# Patient Record
Sex: Female | Born: 1990 | Race: Black or African American | Hispanic: No | Marital: Single | Smoking: Current every day smoker
Health system: Southern US, Community
[De-identification: ages and names within clinical notes are randomized; demographics above are authoritative.]

## PROBLEM LIST (undated history)

## (undated) ENCOUNTER — Inpatient Hospital Stay (HOSPITAL_COMMUNITY): Payer: Self-pay

## (undated) ENCOUNTER — Inpatient Hospital Stay: Payer: Self-pay

## (undated) ENCOUNTER — Ambulatory Visit: Payer: Medicaid Other

## (undated) DIAGNOSIS — B009 Herpesviral infection, unspecified: Secondary | ICD-10-CM

## (undated) DIAGNOSIS — A599 Trichomoniasis, unspecified: Secondary | ICD-10-CM

## (undated) DIAGNOSIS — B9689 Other specified bacterial agents as the cause of diseases classified elsewhere: Secondary | ICD-10-CM

## (undated) DIAGNOSIS — N760 Acute vaginitis: Secondary | ICD-10-CM

## (undated) DIAGNOSIS — A749 Chlamydial infection, unspecified: Secondary | ICD-10-CM

## (undated) DIAGNOSIS — A6009 Herpesviral infection of other urogenital tract: Secondary | ICD-10-CM

## (undated) DIAGNOSIS — O039 Complete or unspecified spontaneous abortion without complication: Secondary | ICD-10-CM

## (undated) DIAGNOSIS — G43909 Migraine, unspecified, not intractable, without status migrainosus: Secondary | ICD-10-CM

## (undated) DIAGNOSIS — N390 Urinary tract infection, site not specified: Secondary | ICD-10-CM

## (undated) HISTORY — DX: Migraine, unspecified, not intractable, without status migrainosus: G43.909

## (undated) HISTORY — PX: NO PAST SURGERIES: SHX2092

## (undated) HISTORY — PX: WISDOM TOOTH EXTRACTION: SHX21

---

## 2011-08-12 ENCOUNTER — Emergency Department (HOSPITAL_COMMUNITY)
Admission: EM | Admit: 2011-08-12 | Discharge: 2011-08-12 | Disposition: A | Discharge status: Home / Self Care | Payer: No Typology Code available for payment source | Attending: Emergency Medicine | Admitting: Emergency Medicine

## 2011-08-12 DIAGNOSIS — R51 Headache: Secondary | ICD-10-CM | POA: Insufficient documentation

## 2011-08-15 ENCOUNTER — Inpatient Hospital Stay (INDEPENDENT_AMBULATORY_CARE_PROVIDER_SITE_OTHER)
Admission: RE | Admit: 2011-08-15 | Discharge: 2011-08-15 | Disposition: A | Discharge status: Home / Self Care | Payer: No Typology Code available for payment source | Source: Ambulatory Visit | Attending: Family Medicine | Admitting: Family Medicine

## 2011-08-15 DIAGNOSIS — S0990XA Unspecified injury of head, initial encounter: Secondary | ICD-10-CM

## 2011-11-12 ENCOUNTER — Emergency Department (INDEPENDENT_AMBULATORY_CARE_PROVIDER_SITE_OTHER)
Admission: EM | Admit: 2011-11-12 | Discharge: 2011-11-12 | Disposition: A | Discharge status: Home / Self Care | Payer: Medicaid Other | Source: Home / Self Care | Attending: Emergency Medicine | Admitting: Emergency Medicine

## 2011-11-12 ENCOUNTER — Encounter: Payer: Self-pay | Admitting: *Deleted

## 2011-11-12 DIAGNOSIS — N760 Acute vaginitis: Secondary | ICD-10-CM

## 2011-11-12 DIAGNOSIS — B9689 Other specified bacterial agents as the cause of diseases classified elsewhere: Secondary | ICD-10-CM

## 2011-11-12 DIAGNOSIS — A499 Bacterial infection, unspecified: Secondary | ICD-10-CM

## 2011-11-12 LAB — POCT URINALYSIS DIP (DEVICE)
Ketones, ur: NEGATIVE mg/dL
Leukocytes, UA: NEGATIVE
Protein, ur: NEGATIVE mg/dL

## 2011-11-12 LAB — WET PREP, GENITAL

## 2011-11-12 LAB — POCT PREGNANCY, URINE: Preg Test, Ur: NEGATIVE

## 2011-11-12 MED ORDER — METRONIDAZOLE 500 MG PO TABS: 500.0000 mg | ORAL_TABLET | Freq: Two times a day (BID) | ORAL | Status: AC

## 2011-11-12 NOTE — ED Notes (Signed)
Co vaginal dc x 1 wk, with foul odor, no itching

## 2011-11-12 NOTE — ED Provider Notes (Signed)
History     CSN: [id number] Arrival date & time: 11/12/2011  4:33 PM   First MD Initiated Contact with Patient 11/12/11 1523      Chief Complaint  Patient presents with  . Vaginal Discharge    (Consider location/radiation/quality/duration/timing/severity/associated sxs/prior treatment) HPI Comments: She is a 20 year old female who has had a one-week history of a white discharge with odor. She denies any itching, irritation, or vaginal or vulvar pain. She has had no fever, chills, nausea, vomiting, pelvic pain, lower back pain, or urinary symptoms. She is on birth control pills. Her menses are irregular. She does have a history of yeast infections. No prior history of bacterial vaginosis or STDs.  Patient is a 20 y.o. female presenting with vaginal discharge.  Vaginal Discharge Pertinent negatives include no abdominal pain.    History reviewed. No pertinent past medical history.  History reviewed. No pertinent past surgical history.  History reviewed. No pertinent family history.  History  Substance Use Topics  . Smoking status: Never Smoker   . Smokeless tobacco: Not on file  . Alcohol Use: No    OB History    Grav Para Term Preterm Abortions TAB SAB Ect Mult Living                  Review of Systems  Constitutional: Negative for fever and chills.  Gastrointestinal: Negative for nausea, vomiting, abdominal pain and diarrhea.  Genitourinary: Positive for vaginal discharge. Negative for dysuria, urgency, frequency, hematuria, vaginal bleeding, difficulty urinating, genital sores, vaginal pain, menstrual problem, pelvic pain and dyspareunia.    Allergies  Review of patient's allergies indicates no known allergies.  Home Medications   Current Outpatient Rx  Name Route Sig Dispense Refill  . LEVONORGESTREL-ETHINYL ESTRAD 0.1-20 MG-MCG PO TABS Oral Take 1 tablet by mouth daily.      [name] METRONIDAZOLE 500 MG PO TABS Oral Take 1 tablet (500 mg total) by mouth 2 (two)  times daily. 14 tablet 0    BP 127/71  Pulse 81  Temp(Src) 98.5 F (36.9 C) (Oral)  Resp 16  SpO2 100%  Physical Exam  Nursing note and vitals reviewed. Constitutional: She appears well-developed and well-nourished. No distress.  Cardiovascular: Normal rate, regular rhythm, normal heart sounds and intact distal pulses.  Exam reveals no gallop and no friction rub.   No murmur heard. Pulmonary/Chest: Effort normal and breath sounds normal. No respiratory distress. She has no wheezes. She has no rales.  Abdominal: Soft. Bowel sounds are normal. She exhibits no distension and no mass. There is no tenderness. There is no rebound and no guarding.  Genitourinary: Uterus normal. There is no rash or lesion on the right labia. There is no rash or lesion on the left labia. Uterus is not deviated, not enlarged, not fixed and not tender. Cervix exhibits no motion tenderness, no discharge and no friability. Right adnexum displays no mass and no tenderness. Left adnexum displays no mass and no tenderness. No erythema, tenderness or bleeding around the vagina. No foreign body around the vagina. Vaginal discharge found.       Pelvic exam reveals normal external genitalia. She has a heavy, white, homogeneous, malodorous discharge. The cervix appears normal. There was no pain on cervical movement. Uterus was anterior, normal size and shape and nontender. There were no adnexal tenderness or masses.  Skin: Skin is warm and dry. No rash noted. She is not diaphoretic.    ED Course  Procedures (including critical care time)  Results for  orders placed during the hospital encounter of 11/12/11  POCT URINALYSIS DIP (DEVICE)      Component Value Range   Glucose, UA NEGATIVE  NEGATIVE (mg/dL)   Bilirubin Urine NEGATIVE  NEGATIVE    Ketones, ur NEGATIVE  NEGATIVE (mg/dL)   Specific Gravity, Urine 1.025  1.005 - 1.030    Hgb urine dipstick NEGATIVE  NEGATIVE    pH 6.5  5.0 - 8.0    Protein, ur NEGATIVE  NEGATIVE  (mg/dL)   Urobilinogen, UA 0.2  0.0 - 1.0 (mg/dL)   Nitrite NEGATIVE  NEGATIVE    Leukocytes, UA NEGATIVE  NEGATIVE   POCT PREGNANCY, URINE      Component Value Range   Preg Test, Ur NEGATIVE       Labs Reviewed  POCT URINALYSIS DIP (DEVICE)  POCT PREGNANCY, URINE  POCT URINALYSIS DIPSTICK  POCT PREGNANCY, URINE  WET PREP, GENITAL  GC/CHLAMYDIA PROBE AMP, GENITAL   No results found.   1. Bacterial vaginosis       MDM  She has bacterial vaginosis and will treat with a one-week course of metronidazole. Also suggested probiotics for prevention of future episodes.        Roque Lias, MD 11/12/11 [phone]

## 2011-11-12 NOTE — ED Notes (Signed)
Pt states she had been on depo injection and recently swithced to bcp, states lmp about 2 months ago

## 2011-11-13 NOTE — ED Notes (Signed)
Wet prep result reviewed for 12/16. Pt. adequately treated with Flagyl for clue cells.  GC/chlamydia pending. Vassie Moselle  11/13/2011

## 2011-11-14 NOTE — ED Notes (Signed)
GC and Chlamydia neg.  Jody Perez 11/14/2011

## 2012-01-21 ENCOUNTER — Encounter (HOSPITAL_COMMUNITY): Payer: Self-pay | Admitting: Emergency Medicine

## 2012-01-21 ENCOUNTER — Emergency Department (INDEPENDENT_AMBULATORY_CARE_PROVIDER_SITE_OTHER)
Admission: EM | Admit: 2012-01-21 | Discharge: 2012-01-21 | Disposition: A | Discharge status: Home / Self Care | Payer: Self-pay | Source: Home / Self Care | Attending: Emergency Medicine | Admitting: Emergency Medicine

## 2012-01-21 DIAGNOSIS — A6000 Herpesviral infection of urogenital system, unspecified: Secondary | ICD-10-CM

## 2012-01-21 DIAGNOSIS — A4902 Methicillin resistant Staphylococcus aureus infection, unspecified site: Secondary | ICD-10-CM

## 2012-01-21 HISTORY — DX: Other specified bacterial agents as the cause of diseases classified elsewhere: N76.0

## 2012-01-21 HISTORY — DX: Chlamydial infection, unspecified: A74.9

## 2012-01-21 HISTORY — DX: Other specified bacterial agents as the cause of diseases classified elsewhere: B96.89

## 2012-01-21 HISTORY — DX: Trichomoniasis, unspecified: A59.9

## 2012-01-21 HISTORY — DX: Urinary tract infection, site not specified: N39.0

## 2012-01-21 LAB — POCT URINALYSIS DIP (DEVICE)
Bilirubin Urine: NEGATIVE
Glucose, UA: NEGATIVE mg/dL
Hgb urine dipstick: NEGATIVE
Specific Gravity, Urine: 1.015 (ref 1.005–1.030)
Urobilinogen, UA: 1 mg/dL (ref 0.0–1.0)

## 2012-01-21 LAB — WET PREP, GENITAL: Yeast Wet Prep HPF POC: NONE SEEN

## 2012-01-21 MED ORDER — DOXYCYCLINE HYCLATE 100 MG PO CAPS: 100.0000 mg | ORAL_CAPSULE | Freq: Two times a day (BID) | ORAL | Status: AC

## 2012-01-21 MED ORDER — CHLORHEXIDINE GLUCONATE 4 % EX LIQD: 60.0000 mL | Freq: Every day | CUTANEOUS | Status: AC | PRN

## 2012-01-21 MED ORDER — CEFTRIAXONE SODIUM 250 MG IJ SOLR
250.0000 mg | Freq: Once | INTRAMUSCULAR | Status: AC
  Administered 2012-01-21: 250 mg via INTRAMUSCULAR

## 2012-01-21 MED ORDER — CEFTRIAXONE SODIUM 250 MG IJ SOLR
INTRAMUSCULAR | Status: AC
  Filled 2012-01-21: qty 250

## 2012-01-21 MED ORDER — ACYCLOVIR 400 MG PO TABS: 400.0000 mg | ORAL_TABLET | Freq: Three times a day (TID) | ORAL | Status: AC

## 2012-01-21 NOTE — ED Provider Notes (Addendum)
History     CSN: [id number]  Arrival date & time 01/21/12  1705   First MD Initiated Contact with Patient 01/21/12 1714      Chief Complaint  Patient presents with  . Vaginal Discharge    (Consider location/radiation/quality/duration/timing/severity/associated sxs/prior treatment) HPI Comments: Patient reports oderous vaginal discharge starting 3 days ago, after having unprotected intercourse with a new female partner. Reports dysuria. No recent antibiotics. She denies genital blisters, rash, urgency, frequency, hematuria, abdominal pain, back pain, fevers. Patient is unsure if her partner is symptomatic. STDs are a concern today. Patient currently not using any other birth control. Patient with a history of Chlamydia, trichomoniasis, BV. No history of gonorrhea, herpes, syphilis, HIV.  Patient also presents with a painful, erythematous area of gradually increasing size on her back starting 3 days ago. Thinks she may have been bitten by a spider. Took 2 pills of a friend's antibiotic prescription, last dose today, but is unsure what antibiotic it is. No fevers, drainage, other lesions. No history of MRSA infections, diabetes.  Patient is a 21 y.o. female presenting with vaginal discharge and rash. The history is provided by the patient.  Vaginal Discharge This is a new problem. The current episode started more than 2 days ago. The problem occurs constantly. Pertinent negatives include no abdominal pain. The symptoms are aggravated by nothing. The symptoms are relieved by nothing. She has tried nothing for the symptoms. The treatment provided no relief.  Rash  This is a new problem. The current episode started more than 2 days ago. The problem has not changed since onset.Associated with: possible spider bite. There has been no fever. The rash is present on the back. Associated symptoms include pain. Pertinent negatives include no blisters, no itching and no weeping. She has tried nothing for the  symptoms. The treatment provided no relief.    Past Medical History  Diagnosis Date  . Chlamydia   . BV (bacterial vaginosis)   . Trichomonas   . UTI (lower urinary tract infection)     History reviewed. No pertinent past surgical history.  History reviewed. No pertinent family history.  History  Substance Use Topics  . Smoking status: Never Smoker   . Smokeless tobacco: Not on file  . Alcohol Use: No    OB History    Grav Para Term Preterm Abortions TAB SAB Ect Mult Living                  Review of Systems  Constitutional: Negative for fever.  Gastrointestinal: Negative for nausea, vomiting and abdominal pain.  Genitourinary: Positive for dysuria and vaginal discharge. Negative for urgency, frequency, hematuria, flank pain, vaginal bleeding and pelvic pain.  Musculoskeletal: Negative for back pain.  Skin: Positive for rash. Negative for itching.    Allergies  Review of patient's allergies indicates no known allergies.  Home Medications   Current Outpatient Rx  Name Route Sig Dispense Refill  . ACYCLOVIR 400 MG PO TABS Oral Take 1 tablet (400 mg total) by mouth 3 (three) times daily. X 10 days 30 tablet 1  . CHLORHEXIDINE GLUCONATE 4 % EX LIQD Topical Apply 60 mLs (4 application total) topically daily as needed. Use daily for 1-2 weeks 120 mL 0  . DOXYCYCLINE HYCLATE 100 MG PO CAPS Oral Take 1 capsule (100 mg total) by mouth 2 (two) times daily. X 10 days 20 capsule 0  . LEVONORGESTREL-ETHINYL ESTRAD 0.1-20 MG-MCG PO TABS Oral Take 1 tablet by mouth daily.  LMP 01/03/2012  Physical Exam  Nursing note and vitals reviewed. Constitutional: She is oriented to person, place, and time. She appears well-developed and well-nourished. No distress.  HENT:  Head: Normocephalic and atraumatic.  Eyes: EOM are normal.  Neck: Normal range of motion.  Cardiovascular: Normal rate.   Pulmonary/Chest: Effort normal.  Abdominal: Soft. Bowel sounds are normal. She  exhibits no distension. There is no tenderness. There is no rebound and no guarding.  Genitourinary: Uterus normal. Pelvic exam was performed with patient supine. There is rash and lesion on the right labia. There is rash on the left labia. Uterus is not tender. Cervix exhibits discharge and friability. Cervix exhibits no motion tenderness. Right adnexum displays no mass, no tenderness and no fullness. Left adnexum displays no mass, no tenderness and no fullness. No erythema, tenderness or bleeding around the vagina. No foreign body around the vagina. Vaginal discharge found.       Mucoid discharge from os. Multiple shallow painful ulcers on labia, vaginal walls and on cervix. .Chaperone present during exam  Musculoskeletal: Normal range of motion.  Neurological: She is alert and oriented to person, place, and time.  Skin: Skin is warm and dry.       4 x 3 area of tender erythema, induration on left lower back. No central fluctuance, expressible drainage.  Psychiatric: She has a normal mood and affect. Her behavior is normal. Judgment and thought content normal.    ED Course  Procedures (including critical care time)  Labs Reviewed  POCT URINALYSIS DIP (DEVICE) - Abnormal; Notable for the following:    Leukocytes, UA SMALL (*) Biochemical Testing Only. Please order routine urinalysis from main lab if confirmatory testing is needed.   All other components within normal limits  WET PREP, GENITAL - Abnormal; Notable for the following:    WBC, Wet Prep HPF POC MANY (*)    All other components within normal limits  POCT PREGNANCY, URINE  HERPES SIMPLEX VIRUS CULTURE  GC/CHLAMYDIA PROBE AMP, GENITAL   No results found.   1. MRSA infection   2. Genital herpes simplex    Results for orders placed during the hospital encounter of 01/21/12  POCT URINALYSIS DIP (DEVICE)      Component Value Range   Glucose, UA NEGATIVE  NEGATIVE (mg/dL)   Bilirubin Urine NEGATIVE  NEGATIVE    Ketones, ur  NEGATIVE  NEGATIVE (mg/dL)   Specific Gravity, Urine 1.015  1.005 - 1.030    Hgb urine dipstick NEGATIVE  NEGATIVE    pH 7.0  5.0 - 8.0    Protein, ur NEGATIVE  NEGATIVE (mg/dL)   Urobilinogen, UA 1.0  0.0 - 1.0 (mg/dL)   Nitrite NEGATIVE  NEGATIVE    Leukocytes, UA SMALL (*) NEGATIVE   POCT PREGNANCY, URINE      Component Value Range   Preg Test, Ur NEGATIVE  NEGATIVE   WET PREP, GENITAL      Component Value Range   Yeast Wet Prep HPF POC NONE SEEN  NONE SEEN    Trich, Wet Prep NONE SEEN  NONE SEEN    Clue Cells Wet Prep HPF POC NONE SEEN  NONE SEEN    WBC, Wet Prep HPF POC MANY (*) NONE SEEN       MDM  Previous labs, charts reviewed. Patient has a history of BV. Gonorrhea Chlamydia, trichomoniasis negative on 11/12/2011  H&P most c/w herpes. Sent off GC/chlamydia, wet prep, herpes culture. Patient also has a MRSA infection on her left  lower back. Will  treat empirically now. Giving ceftriaxone 250 mg IM for gonorrhea,  and will send home with doxycycline, which will cover Chlamydia in addition to treating  the MRSA infection. Sending home with acyclovir for the herpes. Advised pt to refrain from sexual contact until she  knows lab results, symptoms resolve, and partner(s) are treated. Pt provided working phone number. Pt agrees.     Luiz Blare, MD 01/21/12 4540  Luiz Blare, MD 01/21/12 [phone]

## 2012-01-21 NOTE — ED Notes (Signed)
3 days ago pt started having vaginal discharge and odor. Pt has no pain or frequency with urination. Pt had unprotected sex 3 days ago. She also feels like she was bitten by a spider 3 days ago.

## 2012-01-21 NOTE — Discharge Instructions (Signed)
Take the medication as written. Give [place] a working phone number so that we can contact you if needed. Refrain from sexual contact until you know your results and your partner(s) are treated. Always use condoms.  Finish the antibiotics even if you feel better. start doing the warm compresses. Start taking 600 mg of motrin and 1 gram of tylenol with it as needed for pain. This is an effective combination for pain.  Return sooner if you have a fever >100.4, if you get worse, or any other concerns.

## 2012-01-24 LAB — HERPES SIMPLEX VIRUS CULTURE

## 2012-01-25 ENCOUNTER — Telehealth (HOSPITAL_COMMUNITY): Payer: Self-pay | Admitting: *Deleted

## 2012-01-25 NOTE — ED Notes (Signed)
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 2/26 I called and left message for pt. to call. DHHS form completed and faxed to [facility]. 2/28 I called pt. and verified x 2.  Pt. given results and told she was adequately treated for Chlamydia with Doxycycline and Herpes with Acyclovir and to finish all of medication. Pt. instructed to notify her partner, no sex for 1 week and to practice safe sex. Pt. told she can get HIV testing at the [facility]. STD clinic. For Herpes, pt. told that she  can pass the virus even when you don't have an outbreak, so always practice safe sex. Get treated for each outbreak with Acyclovir or Valtrex. Pt. voiced understanding. No questions. Vassie Moselle 01/25/2012

## 2012-02-06 ENCOUNTER — Telehealth (HOSPITAL_COMMUNITY): Payer: Self-pay | Admitting: *Deleted

## 2012-02-06 NOTE — ED Notes (Signed)
3/11 Pt. called and said Rx. not at [facility]. I called [facility] on [address] and the pharmacist said she got the Acyclovir 2/25 but did not pick up the Doxycycline and it was put back. She now has a Rx. for Zithromax from Dr. [facility] but has not picked it up yet. I called and left a message. 3/12 I called and talked to the pt. She said she could not afford the Doxycycline and that is why she did not pick it up. She is going to get the Zithromax tomorrow. I told pt. She has been untreated since 2/24 and should not be having sex. She said she is not.  I told her the Zithromax will treat the Chlamydia but no sex until she is treated and 1 week after tx. Pt. voiced understanding. I told pt. she needs to take the medicine ASAP because it can scar her fallopian tubes it infection not treated. Vassie Moselle 02/06/2012

## 2012-04-15 ENCOUNTER — Encounter (HOSPITAL_COMMUNITY): Payer: Self-pay | Admitting: *Deleted

## 2012-04-15 ENCOUNTER — Inpatient Hospital Stay (HOSPITAL_COMMUNITY)
Admission: AD | Admit: 2012-04-15 | Discharge: 2012-04-15 | Disposition: A | Discharge status: Home / Self Care | Payer: Medicaid Other | Source: Ambulatory Visit | Attending: Obstetrics & Gynecology | Admitting: Obstetrics & Gynecology

## 2012-04-15 ENCOUNTER — Inpatient Hospital Stay (HOSPITAL_COMMUNITY): Payer: Medicaid Other

## 2012-04-15 DIAGNOSIS — R109 Unspecified abdominal pain: Secondary | ICD-10-CM | POA: Insufficient documentation

## 2012-04-15 DIAGNOSIS — O200 Threatened abortion: Secondary | ICD-10-CM | POA: Insufficient documentation

## 2012-04-15 DIAGNOSIS — Z8742 Personal history of other diseases of the female genital tract: Secondary | ICD-10-CM

## 2012-04-15 HISTORY — DX: Herpesviral infection, unspecified: B00.9

## 2012-04-15 LAB — URINE MICROSCOPIC-ADD ON

## 2012-04-15 LAB — URINALYSIS, ROUTINE W REFLEX MICROSCOPIC
Glucose, UA: NEGATIVE mg/dL
Specific Gravity, Urine: 1.02 (ref 1.005–1.030)
pH: 8 (ref 5.0–8.0)

## 2012-04-15 LAB — CBC
Hemoglobin: 12 g/dL (ref 12.0–15.0)
Platelets: 299 10*3/uL (ref 150–400)
RBC: 3.99 MIL/uL (ref 3.87–5.11)
WBC: 10.4 10*3/uL (ref 4.0–10.5)

## 2012-04-15 LAB — WET PREP, GENITAL
Trich, Wet Prep: NONE SEEN
Yeast Wet Prep HPF POC: NONE SEEN

## 2012-04-15 LAB — POCT PREGNANCY, URINE: Preg Test, Ur: POSITIVE — AB

## 2012-04-15 LAB — HCG, QUANTITATIVE, PREGNANCY: hCG, Beta Chain, Quant, S: 1794 m[IU]/mL — ABNORMAL HIGH (ref <5)

## 2012-04-15 IMAGING — US US OB COMP LESS 14 WK
1 series · 13 of 28 positions shown · non-contrast
Comparison: none

[Series 1: us ob comp less 14 wks · 13 of 41 slices shown]
[im 2/41]
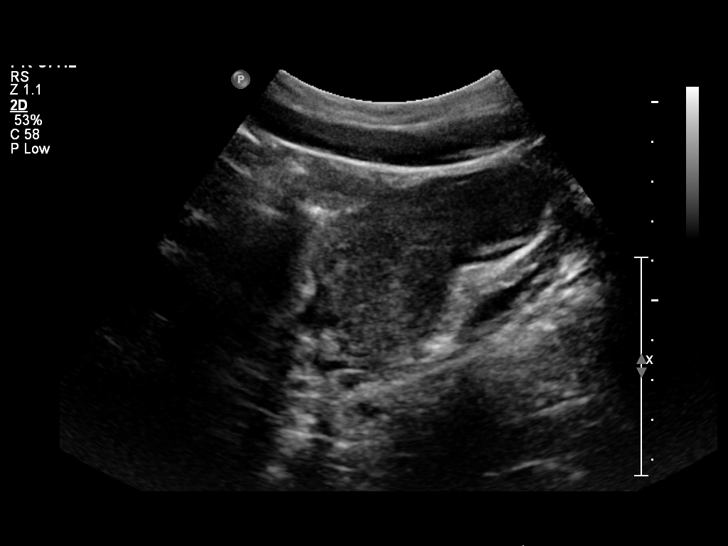
[im 5/41]
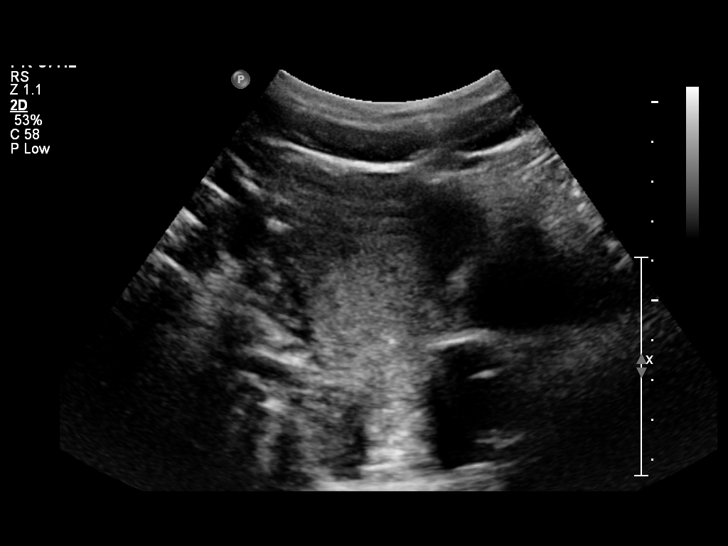
[im 8/41]
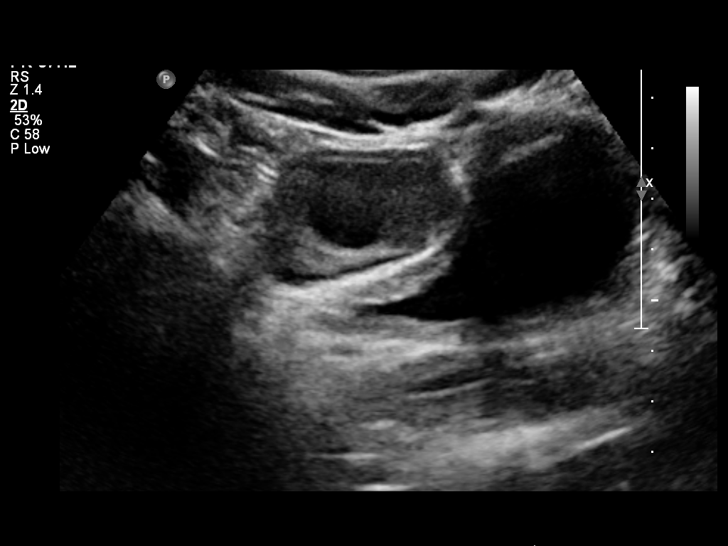
[im 11/41]
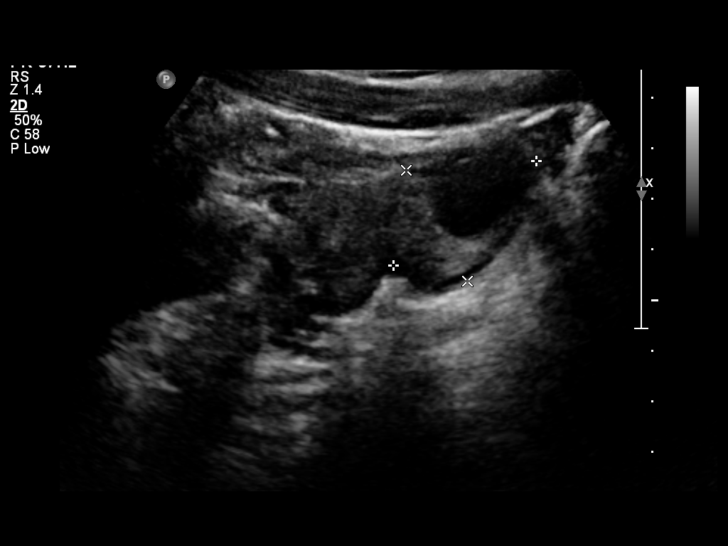
[im 14/41]
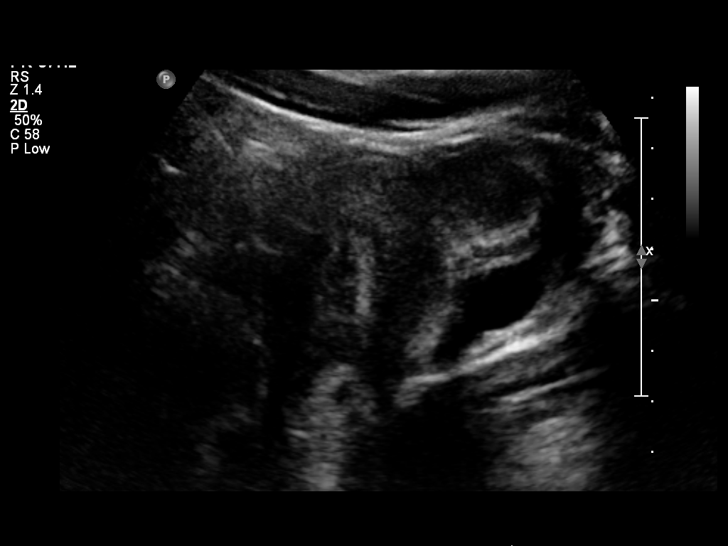
[im 17/41]
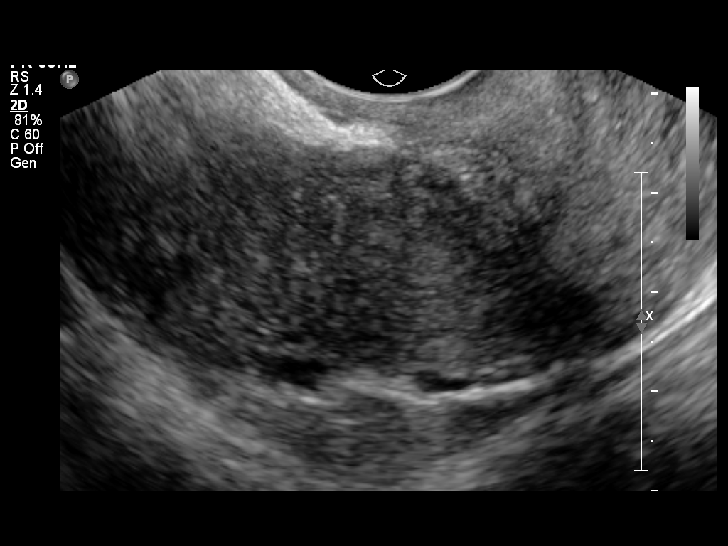
[im 21/41]
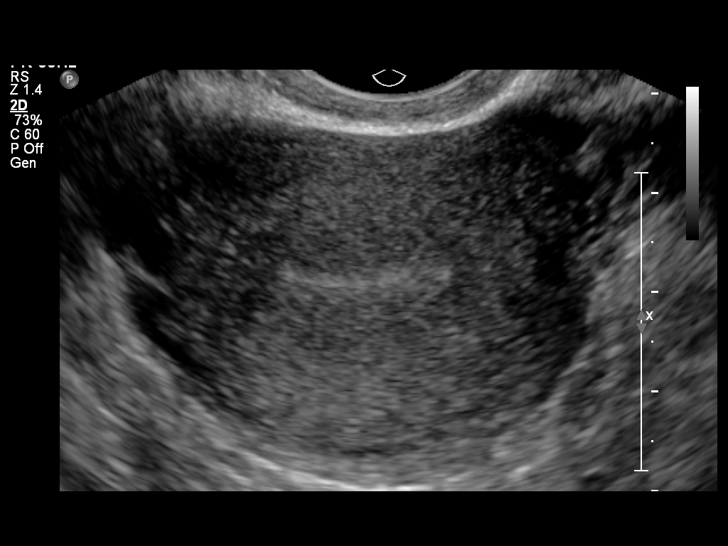
[im 24/41]
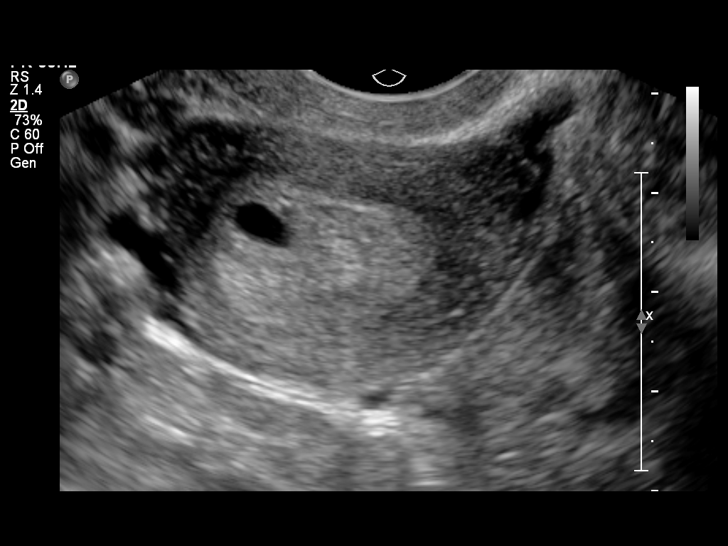
[im 27/41]
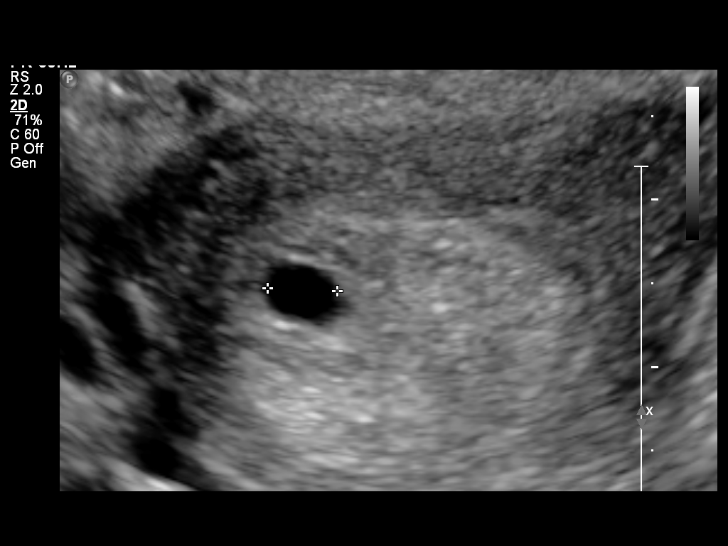
[im 30/41]
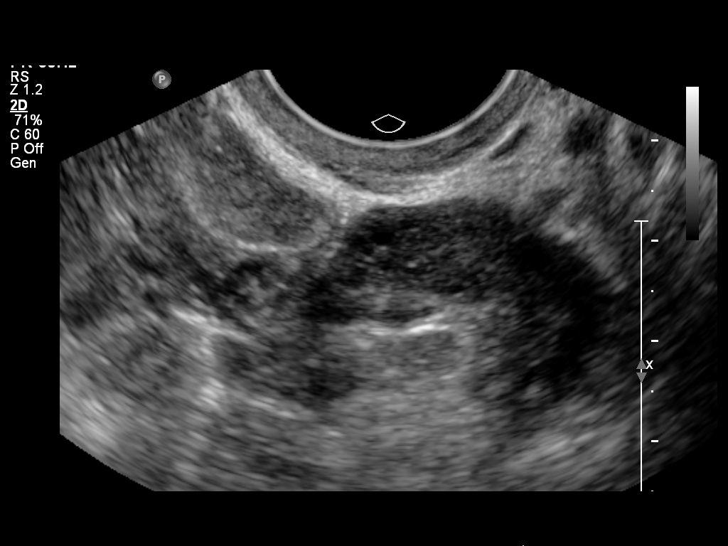
[im 33/41]
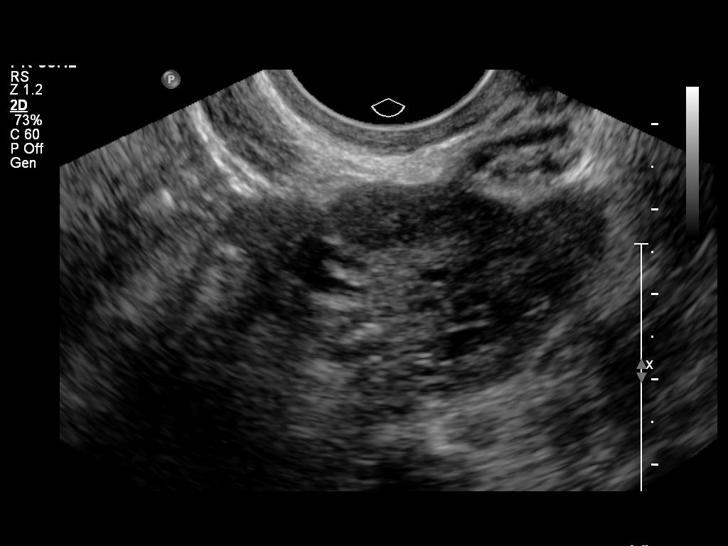
[im 36/41]
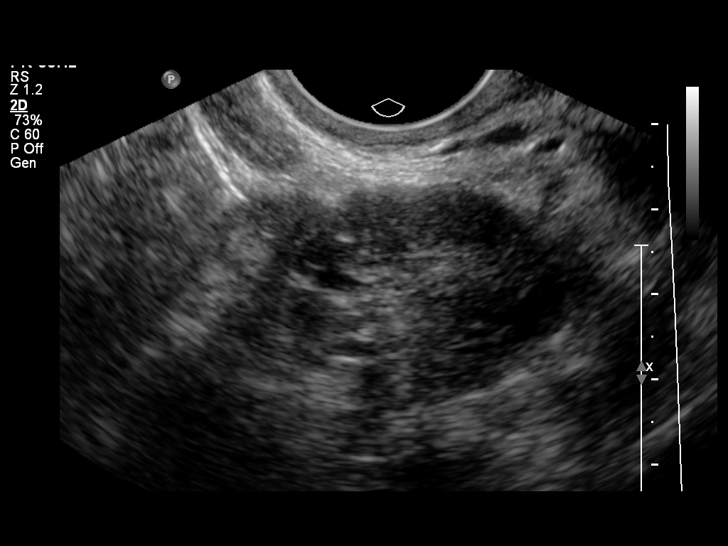
[im 39/41]
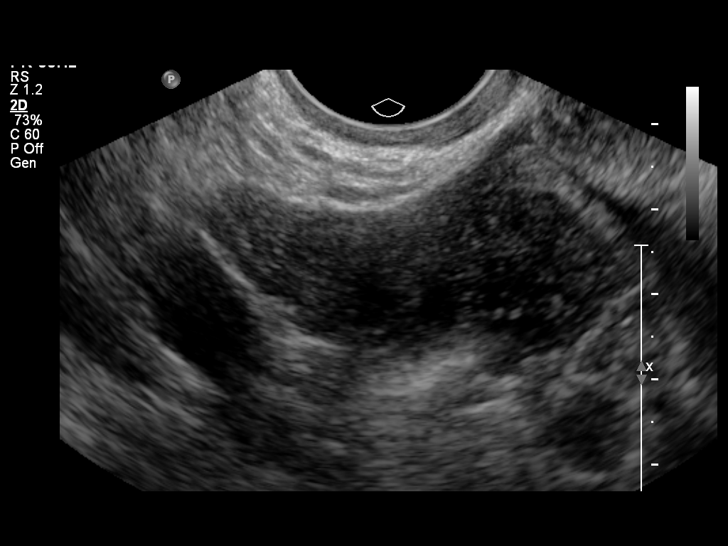

[13 of 28 positions shown; findings below may reference images not displayed]

OBSTETRICS REPORT
                      (Signed Final [DATE] [DATE])

                 CNM
                 94_E
Procedures

 US OB COMP LESS 14 WKS                                76801.0
 US OB TRANSVAGINAL                                    76817.0
Indications

 Pain - Abdominal/Pelvic
 Vaginal bleeding, unknown etiology
Fetal Evaluation

 Preg. Location:    Intrauterine
 Gest. Sac:         Intrauterine
 Yolk Sac:          Visualized
 Fetal Pole:        Not visualized
 Cardiac Activity:  No embryo visualized
Cervix Uterus Adnexa

 Cervix:       Closed.
 Uterus:       No abnormality visualized.
 Cul De Sac:   No free fluid seen.
 Left Ovary:   Within normal limits.
 Right Ovary:  Within normal limits. Small corpus luteum noted.

 Adnexa:     No abnormality visualized.
Comments

Impression

 Intrauterine GS, low in position, favored to be in the LUS
 rather than the cervix. Differential includes low implantation
 site or abortion in progress. MSD would be just under 5
 weeks, different than dating. No FP seen at this time. Would
 follow HCG and follow closely with US to exlude a cervical
 ectopic, abortion, or to establish dating.

 As needed, could perform f/u US later tonight to determine if
 the sac has changed location.

 Findings were discussed with EKNOOR at [DATE] on
 [DATE]. Per patient's report, US showed a 5 week sac
 approximately 2 weeks ago. With this information, an
 abortion in progress or early failed pregnancy would be
 favored.

 questions or concerns.

## 2012-04-15 NOTE — Discharge Instructions (Signed)
Threatened Miscarriage  Bleeding during the first 20 weeks of pregnancy is common. This is sometimes called a threatened miscarriage. This is a pregnancy that is threatening to end before the twentieth week of pregnancy. Often this bleeding stops with bed rest or decreased activities as suggested by your caregiver and the pregnancy continues without any more problems. You may be asked to not have sexual intercourse, have orgasms or use tampons until further notice. Sometimes a threatened miscarriage can progress to a complete or incomplete miscarriage. This may or may not require further treatment. Some miscarriages occur before a woman misses a menstrual period and knows she is pregnant.  Miscarriages occur in 15 to 20% of all pregnancies and usually occur during the first 13 weeks of the pregnancy. The exact cause of a miscarriage is usually never known. A miscarriage is natures way of ending a pregnancy that is abnormal or would not make it to term. There are some things that may put you at risk to have a miscarriage, such as:   Hormone problems.   Infection of the uterus or cervix.   Chronic illness, diabetes for example, especially if it is not controlled.   Abnormal shaped uterus.   Fibroids in the uterus.   Incompetent cervix (the cervix is too weak to hold the baby).   Smoking.   Drinking too much alcohol. It's best not to drink any alcohol when you are pregnant.   Taking illegal drugs.  TREATMENT   When a miscarriage becomes complete and all products of conception (all the tissue in the uterus) have been passed, often no treatment is needed. If you think you passed tissue, save it in a container and take it to your doctor for evaluation. If the miscarriage is incomplete (parts of the fetus or placenta remain in the uterus), further treatment may be needed. The most common reason for further treatment is continued bleeding (hemorrhage) because pregnancy tissue did not pass out of the uterus. This  often occurs if a miscarriage is incomplete. Tissue left behind may also become infected. Treatment usually is dilatation and curettage (the removal of the remaining products of pregnancy. This can be done by a simple sucking procedure (suction curettage) or a simple scraping of the inside of the uterus. This may be done in the hospital or in the caregiver's office. This is only done when your caregiver knows that there is no chance for the pregnancy to proceed to term. This is determined by physical examination, negative pregnancy test, falling pregnancy hormone count and/or, an ultrasound revealing a dead fetus.  Miscarriages are often a very emotional time for prospective mothers and fathers. This is not you or your partners fault. It did not occur because of an inadequacy in you or your partner. Nearly all miscarriages occur because the pregnancy has started off wrongly. At least half of these pregnancies have a chromosomal abnormality. It is almost always not inherited. Others may have developmental problems with the fetus or placenta. This does not always show up even when the products miscarried are studied under the microscope. The miscarriage is nearly always not your fault and it is not likely that you could have prevented it from happening. If you are having emotional and grieving problems, talk to your health care provider and even seek counseling, if necessary, before getting pregnant again. You can begin trying for another pregnancy as soon as your caregiver says it is OK.  HOME CARE INSTRUCTIONS    Your caregiver may order   bed rest depending on how much bleeding and cramping you are having. You may be limited to only getting up to go to the bathroom. You may be allowed to continue light activity. You may need to make arrangements for the care of your other children and for any other responsibilities.   Keep track of the number of pads you use each day, how often you have to change pads and how  saturated (soaked) they are. Record this information.   DO NOT USE TAMPONS. Do not douche, have sexual intercourse or orgasms until approved by your caregiver.   You may receive a follow up appointment for re-evaluation of your pregnancy and a repeat blood test. Re-evaluation often occurs after 2 days and again in 4 to 6 weeks. It is very important that you follow-up in the recommended time period.   If you are Rh negative and the father is Rh positive or you do not know the fathers' blood type, you may receive a shot (Rh immune globulin) to help prevent abnormal antibodies that can develop and affect the baby in any future pregnancies.  SEEK IMMEDIATE MEDICAL CARE IF:   You have severe cramps in your stomach, back, or abdomen.   You have a sudden onset of severe pain in the lower part of your abdomen.   You develop chills.   You run an unexplained temperature of 101 F (38.3 C) or higher.   You pass large clots or tissue. Save any tissue for your caregiver to inspect.   Your bleeding increases or you become light-headed, weak, or have fainting episodes.   You have a gush of fluid from your vagina.   You pass out. This could mean you have a tubal (ectopic) pregnancy.  Document Released: 11/13/2005 Document Revised: 11/02/2011 Document Reviewed: 06/29/2008  [employer] Patient Information 2012 [employer]

## 2012-04-15 NOTE — MAU Provider Note (Signed)
Chief Complaint:  Vaginal Bleeding and Abdominal Pain    First Provider Initiated Contact with Patient 04/15/12 1803      Jody Perez is  21 y.o. G1P0.  Patient's last menstrual period was 02/15/2012.[name]  Her pregnancy status is positive. [redacted]w[redacted]d by LMP. She presents complaining of Vaginal Bleeding and Abdominal Pain . Onset is described as intermittent and has been present for  2 days. Reports + UPT at home and [facility] 2 weeks ago. States US at [facility] showed 5 week pregnancy. Last intercourse 4 days ago. Denies nausea, vomiting, dysuria, or back pain.  Obstetrical/Gynecological History: OB History    Grav Para Term Preterm Abortions TAB SAB Ect Mult Living   1               Past Medical History: Past Medical History  Diagnosis Date  . Chlamydia   . BV (bacterial vaginosis)   . Trichomonas   . UTI (lower urinary tract infection)   . HSV infection     Past Surgical History: Past Surgical History  Procedure Date  . No past surgeries     Family History: Family History  Problem Relation Age of Onset  . Hypertension Mother   . Hypertension Brother     Social History: History  Substance Use Topics  . Smoking status: Never Smoker   . Smokeless tobacco: Not on file  . Alcohol Use: No    Allergies: No Known Allergies  No prescriptions prior to admission    Review of Systems - Negative except what has been reviewed in the HPI  Physical Exam   Blood pressure 112/74, pulse 96, temperature 98.7 F (37.1 C), temperature source Oral, resp. rate 16, height 5\' 3"  (1.6 m), weight 162 lb 9.6 oz (73.755 kg), last menstrual period 02/15/2012, SpO2 100.00%.  General: General appearance - alert, well appearing, and in no distress and oriented to person, place, and time Mental status - alert, oriented to person, place, and time, normal mood, behavior, speech, dress, motor activity, and thought processes, affect appropriate to mood Abdomen - soft, nontender, nondistended,  no masses or organomegaly Focused Gynecological Exam: VULVA: normal appearing vulva with no masses, tenderness or lesions, VAGINA: moderate amount of bleeding noted in the vault, dark red, CERVIX: finger tip/thick/firm  Labs: Recent Results (from the past 24 hour(s))  URINALYSIS, ROUTINE W REFLEX MICROSCOPIC   Collection Time   04/15/12  5:40 PM      Component Value Range   Color, Urine YELLOW  YELLOW    APPearance CLOUDY (*) CLEAR    Specific Gravity, Urine 1.020  1.005 - 1.030    pH 8.0  5.0 - 8.0    Glucose, UA NEGATIVE  NEGATIVE (mg/dL)   Hgb urine dipstick LARGE (*) NEGATIVE    Bilirubin Urine NEGATIVE  NEGATIVE    Ketones, ur NEGATIVE  NEGATIVE (mg/dL)   Protein, ur NEGATIVE  NEGATIVE (mg/dL)   Urobilinogen, UA 1.0  0.0 - 1.0 (mg/dL)   Nitrite NEGATIVE  NEGATIVE    Leukocytes, UA NEGATIVE  NEGATIVE   URINE MICROSCOPIC-ADD ON   Collection Time   04/15/12  5:40 PM      Component Value Range   Squamous Epithelial / LPF FEW (*) RARE    RBC / HPF 11-20  <3 (RBC/hpf)   Bacteria, UA MANY (*) RARE   POCT PREGNANCY, URINE   Collection Time   04/15/12  5:52 PM      Component Value Range   Preg Test,  Ur POSITIVE (*) NEGATIVE   HCG, QUANTITATIVE, PREGNANCY   Collection Time   04/15/12  6:15 PM      Component Value Range   hCG, Beta Chain, Quant, S 1794 (*) <5 (mIU/mL)  ABO/RH   Collection Time   04/15/12  6:15 PM      Component Value Range   ABO/RH(D) O POS    CBC   Collection Time   04/15/12  6:15 PM      Component Value Range   WBC 10.4  4.0 - 10.5 (K/uL)   RBC 3.99  3.87 - 5.11 (MIL/uL)   Hemoglobin 12.0  12.0 - 15.0 (g/dL)   HCT 08.6  57.8 - 46.9 (%)   MCV 92.5  78.0 - 100.0 (fL)   MCH 30.1  26.0 - 34.0 (pg)   MCHC 32.5  30.0 - 36.0 (g/dL)   RDW 62.9  52.8 - 41.3 (%)   Platelets 299  150 - 400 (K/uL)   Imaging Studies:     Assessment: Inevitable AB  Plan: Discharge home FU in Gyn Clinic in 3-4 weeks Bleeding precautions reviewed    Jody Perez  E. 04/15/2012,7:20 PM

## 2012-04-15 NOTE — MAU Note (Signed)
Patient states she has had a positive home pregnancy and a positive pregnancy test at [facility] about 2 weeks ago. Started having bleeding and cramping last night. Small amount of bright red blood on pad.

## 2012-04-15 NOTE — MAU Provider Note (Signed)
Attestation of Attending Supervision of Advanced Practitioner: Evaluation and management procedures were performed by the [facility] Fellow/PA/CNM/NP under my supervision and collaboration. Chart reviewed, and agree with management and plan.  Jaynie Collins, M.D. 04/15/2012 7:45 PM

## 2012-04-24 ENCOUNTER — Emergency Department (HOSPITAL_COMMUNITY)
Admission: EM | Admit: 2012-04-24 | Discharge: 2012-04-24 | Discharge status: Absent without leave | Payer: Self-pay | Source: Home / Self Care | Attending: Family Medicine | Admitting: Family Medicine

## 2012-04-29 ENCOUNTER — Emergency Department (HOSPITAL_COMMUNITY)
Admission: EM | Admit: 2012-04-29 | Discharge: 2012-04-29 | Discharge status: Against Medical Advice | Payer: Medicaid Other | Attending: Emergency Medicine | Admitting: Emergency Medicine

## 2012-04-29 ENCOUNTER — Encounter (HOSPITAL_COMMUNITY): Payer: Self-pay

## 2012-04-29 DIAGNOSIS — N898 Other specified noninflammatory disorders of vagina: Secondary | ICD-10-CM | POA: Insufficient documentation

## 2012-04-29 LAB — URINALYSIS, ROUTINE W REFLEX MICROSCOPIC
Bilirubin Urine: NEGATIVE
Glucose, UA: NEGATIVE mg/dL
Hgb urine dipstick: NEGATIVE
Specific Gravity, Urine: 1.024 (ref 1.005–1.030)
Urobilinogen, UA: 1 mg/dL (ref 0.0–1.0)
pH: 6.5 (ref 5.0–8.0)

## 2012-04-29 LAB — URINE MICROSCOPIC-ADD ON

## 2012-04-29 LAB — POCT PREGNANCY, URINE: Preg Test, Ur: NEGATIVE

## 2012-04-29 NOTE — ED Notes (Signed)
Unable to locate pt  

## 2012-04-29 NOTE — ED Notes (Addendum)
Called for pt no answer

## 2012-04-29 NOTE — ED Notes (Signed)
Recent miscarriage

## 2012-04-29 NOTE — ED Notes (Signed)
Call ms.Garraway x2 times.no answer .

## 2012-04-29 NOTE — ED Notes (Signed)
Called for patient in ED waiting room. No answer.

## 2012-04-29 NOTE — ED Notes (Signed)
Complains of vaginal discharge with odor for three days.

## 2012-04-29 NOTE — ED Notes (Signed)
Pt.didnt answer when called for 3rd .time

## 2012-05-15 ENCOUNTER — Ambulatory Visit: Payer: Medicaid Other | Admitting: Advanced Practice Midwife

## 2012-05-29 ENCOUNTER — Telehealth (HOSPITAL_COMMUNITY): Payer: Self-pay | Admitting: *Deleted

## 2012-05-29 NOTE — ED Notes (Signed)
Pt. called and said she needs a Rx. I told her I would call back. I reviewed pt.'s chart.  Pt. was seen in Feb. I called back and left a message. I said she would need to come back and be rechecked because we don't do refills. Vassie Moselle 05/29/2012

## 2012-06-02 ENCOUNTER — Encounter (HOSPITAL_COMMUNITY): Payer: Self-pay | Admitting: *Deleted

## 2012-06-02 ENCOUNTER — Emergency Department (HOSPITAL_COMMUNITY)
Admission: EM | Admit: 2012-06-02 | Discharge: 2012-06-02 | Disposition: A | Discharge status: Home / Self Care | Payer: Self-pay | Source: Home / Self Care | Attending: Emergency Medicine | Admitting: Emergency Medicine

## 2012-06-02 DIAGNOSIS — A6000 Herpesviral infection of urogenital system, unspecified: Secondary | ICD-10-CM

## 2012-06-02 DIAGNOSIS — N760 Acute vaginitis: Secondary | ICD-10-CM

## 2012-06-02 HISTORY — DX: Herpesviral infection of other urogenital tract: A60.09

## 2012-06-02 HISTORY — DX: Complete or unspecified spontaneous abortion without complication: O03.9

## 2012-06-02 LAB — POCT URINALYSIS DIP (DEVICE)
Nitrite: NEGATIVE
Protein, ur: NEGATIVE mg/dL
Specific Gravity, Urine: 1.015 (ref 1.005–1.030)
Urobilinogen, UA: 0.2 mg/dL (ref 0.0–1.0)

## 2012-06-02 LAB — POCT PREGNANCY, URINE: Preg Test, Ur: NEGATIVE

## 2012-06-02 LAB — WET PREP, GENITAL

## 2012-06-02 MED ORDER — METRONIDAZOLE 500 MG PO TABS: 500.0000 mg | ORAL_TABLET | Freq: Two times a day (BID) | ORAL | Status: AC

## 2012-06-02 MED ORDER — ACYCLOVIR 800 MG PO TABS: 800.0000 mg | ORAL_TABLET | Freq: Two times a day (BID) | ORAL | Status: AC

## 2012-06-02 MED ORDER — PHENAZOPYRIDINE HCL 200 MG PO TABS: 200.0000 mg | ORAL_TABLET | Freq: Three times a day (TID) | ORAL | Status: AC | PRN

## 2012-06-02 NOTE — ED Provider Notes (Signed)
History     CSN: [id number]  Arrival date & time 06/02/12  1553   First MD Initiated Contact with Patient 06/02/12 1620      Chief Complaint  Patient presents with  . Vaginal Discharge    (Consider location/radiation/quality/duration/timing/severity/associated sxs/prior treatment) HPI Comments: Pt with 2 days  nonoderous vaginal discharge,  urinary urgency, frequency, dysuria. She reports painful, shallow ulcers on the mons pubis consistent with previous herpes outbreaks. No cloudy or oderous urine, hematuria,   vaginal itching.  No other rash. No fevers, N/V, abd pain, back pain. No recent abx use. Pt sexually active with new  Female  partner who is  asxatic. States that they use condoms. STD's not a concern today. Similar sx before when had herpes outbreaks. Has a history of BV, chlamydia trichmonoas. History of Chlamydia, yeast infection. No h/o syphilis, HIV.    ROS as noted in HPI. All other ROS negative.   Patient is a 21 y.o. female presenting with vaginal discharge and dysuria. The history is provided by the patient. No language interpreter was used.  Vaginal Discharge This is a new problem. The current episode started 2 days ago. The problem occurs constantly. The problem has not changed since onset.Pertinent negatives include no abdominal pain. Nothing aggravates the symptoms. Nothing relieves the symptoms. She has tried nothing for the symptoms. The treatment provided no relief.  Dysuria  This is a new problem. The current episode started 2 days ago. The problem occurs every urination. The problem has not changed since onset.The quality of the pain is described as burning. There has been no fever. She is sexually active. There is no history of pyelonephritis. Associated symptoms include discharge, frequency and urgency. Pertinent negatives include no chills, no nausea, no vomiting, no hematuria, no hesitancy, no possible pregnancy and no flank pain. She has tried nothing for the  symptoms.    Past Medical History  Diagnosis Date  . Chlamydia   . BV (bacterial vaginosis)   . Trichomonas   . UTI (lower urinary tract infection)   . HSV infection   . Herpes genitalis in women   . Miscarriage     Past Surgical History  Procedure Date  . No past surgeries     Family History  Problem Relation Age of Onset  . Hypertension Mother   . Hypertension Brother     History  Substance Use Topics  . Smoking status: Never Smoker   . Smokeless tobacco: Not on file  . Alcohol Use: No    OB History    Grav Para Term Preterm Abortions TAB SAB Ect Mult Living   1               Review of Systems  Constitutional: Negative for chills.  Gastrointestinal: Negative for nausea, vomiting and abdominal pain.  Genitourinary: Positive for dysuria, urgency, frequency and vaginal discharge. Negative for hesitancy, hematuria and flank pain.    Allergies  Review of patient's allergies indicates no known allergies.  Home Medications   Current Outpatient Rx  Name Route Sig Dispense Refill  . ACYCLOVIR 800 MG PO TABS Oral Take 1 tablet (800 mg total) by mouth 2 (two) times daily. X 5 days 10 tablet 1  . METRONIDAZOLE 500 MG PO TABS Oral Take 1 tablet (500 mg total) by mouth 2 (two) times daily. X 7 days 14 tablet 0  . PHENAZOPYRIDINE HCL 200 MG PO TABS Oral Take 1 tablet (200 mg total) by mouth 3 (three) times daily  as needed for pain. 6 tablet 0    BP 122/73  Pulse 75  Temp 98.7 F (37.1 C) (Oral)  Resp 17  SpO2 100%  LMP 05/25/2012  Breastfeeding? No  Physical Exam  Nursing note and vitals reviewed. Constitutional: She is oriented to person, place, and time. She appears well-developed and well-nourished. No distress.  HENT:  Head: Normocephalic and atraumatic.  Eyes: EOM are normal.  Neck: Normal range of motion.  Cardiovascular: Normal rate.   Pulmonary/Chest: Effort normal.  Abdominal: Soft. Bowel sounds are normal. She exhibits no distension. There is no  tenderness. There is no rebound, no guarding and no CVA tenderness.  Genitourinary: Uterus normal. Pelvic exam was performed with patient supine. There is no rash on the right labia. There is no rash on the left labia. Uterus is not tender. Cervix exhibits no motion tenderness and no friability. Right adnexum displays no mass, no tenderness and no fullness. Left adnexum displays no mass, no tenderness and no fullness. No erythema, tenderness or bleeding around the vagina. No foreign body around the vagina. Vaginal discharge found.       Large, tender, shallow ulcer on the mons pubis. No other rash noted on genitals. Thin greenish frothy nonodorous nonoderous vaginal d/c.Chaperone present during exam  Musculoskeletal: Normal range of motion.  Neurological: She is alert and oriented to person, place, and time.  Skin: Skin is warm and dry.  Psychiatric: She has a normal mood and affect. Her behavior is normal. Judgment and thought content normal.    ED Course  Procedures (including critical care time)  Labs Reviewed  POCT URINALYSIS DIP (DEVICE) - Abnormal; Notable for the following:    Hgb urine dipstick TRACE (*)     Leukocytes, UA MODERATE (*)  Biochemical Testing Only. Please order routine urinalysis from main lab if confirmatory testing is needed.   All other components within normal limits  POCT PREGNANCY, URINE  WET PREP, GENITAL  GC/CHLAMYDIA PROBE AMP, GENITAL   No results found.   1. Vaginitis   2. Herpes, genital       MDM  Previous labs reviewed. Positive for chlamydia feb 2013. Also history of BV. Negative gonorrhea, yeast, trichomoniasis 3 times since December 2012  H&P most c/w yeast infection BV vs trich and herpes outbreak. Suspect patient's urinary symptoms and moderate leukocytes on dip R. from the vaginitis.  Sent off GC/chlamydia, wet prep. Will not treat empirically now. . Will send home with flagyl and acyclovir  Advised pt to refrain from sexual contact until she  knows lab results, symptoms resolve, and partner(s) are treated. Pt provided working phone number. Pt agrees. Providing patient with a list of resources for ongoing primary care.    Luiz Blare, MD 06/02/12 [phone]

## 2012-06-02 NOTE — ED Notes (Signed)
Pt with vaginal discharge x 2 days - herpes outbreak onset x 3 days - burning with urination

## 2012-06-03 LAB — GC/CHLAMYDIA PROBE AMP, GENITAL: GC Probe Amp, Genital: NEGATIVE

## 2012-06-03 NOTE — ED Notes (Signed)
GC/Chlamydia neg., Wet prep: many clue cells, many WBC's.  Pt. adequately treated with Flagyl. Vassie Moselle 06/03/2012

## 2012-09-25 ENCOUNTER — Emergency Department (INDEPENDENT_AMBULATORY_CARE_PROVIDER_SITE_OTHER)
Admission: EM | Admit: 2012-09-25 | Discharge: 2012-09-25 | Disposition: A | Discharge status: Home / Self Care | Payer: Medicaid Other | Source: Home / Self Care | Attending: Family Medicine | Admitting: Family Medicine

## 2012-09-25 ENCOUNTER — Encounter (HOSPITAL_COMMUNITY): Payer: Self-pay | Admitting: Emergency Medicine

## 2012-09-25 DIAGNOSIS — N898 Other specified noninflammatory disorders of vagina: Secondary | ICD-10-CM

## 2012-09-25 DIAGNOSIS — R300 Dysuria: Secondary | ICD-10-CM

## 2012-09-25 LAB — WET PREP, GENITAL
Clue Cells Wet Prep HPF POC: NONE SEEN
Trich, Wet Prep: NONE SEEN
Yeast Wet Prep HPF POC: NONE SEEN

## 2012-09-25 MED ORDER — ACYCLOVIR 400 MG PO TABS: 400.0000 mg | ORAL_TABLET | Freq: Three times a day (TID) | ORAL | Status: DC

## 2012-09-25 NOTE — ED Provider Notes (Signed)
History     CSN: [id number]  Arrival date & time 09/25/12  1518   None     Chief Complaint  Patient presents with  . Vaginal Discharge  . SEXUALLY TRANSMITTED DISEASE    (Consider location/radiation/quality/duration/timing/severity/associated sxs/prior treatment) Patient is a 21 y.o. female presenting with vaginal discharge. The history is provided by the patient.  Vaginal Discharge This is a new problem. The current episode started more than 2 days ago (3 days).  21 y.o. female complains of irritating white vaginal discharge for  3 days.  Denies abnormal vaginal bleeding, significant pelvic pain or fever. +dysuria. Sexually active, does not use condoms, no change in partner.  Last unprotected intercourse 10 days ago.  Denies history of known exposure to STD or symptoms in partner.  Patient's last menstrual period was 02/15/2012.  History of trich six months ago. Additionally request medication for herpetic outbreak, states last outbreak one year ago.    Past Medical History  Diagnosis Date  . Chlamydia   . BV (bacterial vaginosis)   . Trichomonas   . UTI (lower urinary tract infection)   . HSV infection   . Herpes genitalis in women   . Miscarriage     Past Surgical History  Procedure Date  . No past surgeries     Family History  Problem Relation Age of Onset  . Hypertension Mother   . Hypertension Brother     History  Substance Use Topics  . Smoking status: Never Smoker   . Smokeless tobacco: Not on file  . Alcohol Use: No    OB History    Grav Para Term Preterm Abortions TAB SAB Ect Mult Living   1               Review of Systems  Constitutional: Negative.   HENT: Negative.   Respiratory: Negative.   Cardiovascular: Negative.   Gastrointestinal: Negative.   Genitourinary: Positive for dysuria and vaginal discharge. Negative for flank pain and menstrual problem.  All other systems reviewed and are negative.    Allergies  Review of patient's  allergies indicates no known allergies.  Home Medications   Current Outpatient Rx  Name Route Sig Dispense Refill  . ACYCLOVIR 400 MG PO TABS Oral Take 1 tablet (400 mg total) by mouth 3 (three) times daily. For five days.  May repeat as needed. 50 tablet 2    BP 127/74  Pulse 79  Temp 98.7 F (37.1 C) (Oral)  Resp 18  SpO2 99%  LMP 02/15/2012  Physical Exam  Nursing note and vitals reviewed. Constitutional: She is oriented to person, place, and time. Vital signs are normal. She appears well-developed and well-nourished. She is active and cooperative.  HENT:  Head: Normocephalic.  Right Ear: External ear normal.  Left Ear: External ear normal.  Mouth/Throat: Uvula is midline and mucous membranes are normal. Posterior oropharyngeal erythema present. No oropharyngeal exudate or posterior oropharyngeal edema.  Eyes: Conjunctivae normal are normal. Pupils are equal, round, and reactive to light. No scleral icterus.  Neck: Trachea normal, normal range of motion and full passive range of motion without pain. Neck supple. Normal carotid pulses present. No spinous process tenderness and no muscular tenderness present. Carotid bruit is not present.  Cardiovascular: Normal rate, regular rhythm, normal heart sounds, intact distal pulses and normal pulses.   Pulmonary/Chest: Effort normal and breath sounds normal.  Abdominal: Soft. Normal appearance and bowel sounds are normal. There is no hepatosplenomegaly. There is no tenderness. There  is no rebound.  Genitourinary:    Pelvic exam was performed with patient supine. There is lesion on the right labia. There is no rash, tenderness or injury on the right labia. There is no rash, tenderness, lesion or injury on the left labia.  Lymphadenopathy:       Head (right side): No submental, no submandibular, no tonsillar, no preauricular, no posterior auricular and no occipital adenopathy present.       Head (left side): No submental, no  submandibular, no tonsillar, no preauricular, no posterior auricular and no occipital adenopathy present.    She has no cervical adenopathy.  Neurological: She is alert and oriented to person, place, and time. She has normal strength. No cranial nerve deficit or sensory deficit. Coordination and gait normal. GCS eye subscore is 4. GCS verbal subscore is 5. GCS motor subscore is 6.  Skin: Skin is warm and dry. No rash noted.  Psychiatric: She has a normal mood and affect. Her speech is normal and behavior is normal. Judgment and thought content normal. Cognition and memory are normal.    ED Course  Procedures (including critical care time)  Labs Reviewed  WET PREP, GENITAL - Abnormal; Notable for the following:    WBC, Wet Prep HPF POC FEW (*)     All other components within normal limits  GC/CHLAMYDIA PROBE AMP, GENITAL   No results found.   1. Vaginal discharge   2. Dysuria       MDM  Await gc/ct results, condoms for std prevention.  Follow up with primary care provider.        Johnsie Kindred, NP 09/25/12 1845

## 2012-09-25 NOTE — ED Notes (Signed)
Reports vaginal discharge is green.  Reports a yeast infection a month ago; it cleared with OTC medication.

## 2012-09-26 LAB — GC/CHLAMYDIA PROBE AMP, GENITAL: GC Probe Amp, Genital: NEGATIVE

## 2012-09-26 NOTE — ED Provider Notes (Signed)
Medical screening examination/treatment/procedure(s) were performed by resident physician or non-physician practitioner and as supervising physician I was immediately available for consultation/collaboration.   Macaela Presas DOUGLAS MD.    Dorice Stiggers D Olliver Boyadjian, MD 09/26/12 1856 

## 2012-11-27 NOTE — L&D Delivery Note (Signed)
Delivery Note At 2:40 AM a viable female was delivered via Vaginal, Spontaneous Delivery (Presentation: Left Occiput Anterior).  APGAR: Pending, Infant was pale, and decreased tone on abdomen. Cord was clamped and cut, then taken to warmer Placenta status: Intact, Spontaneous.  Cord: 3 vessels with the following complications: None.   Anesthesia: Epidural  Episiotomy: None Lacerations: None Suture Repair: NA Est. Blood Loss (mL): 250  Mom to postpartum.  Baby to NICU.  NSVD over intact perineum with active management of third stage. Complicated by GBS + and chorio and rec'd Amp/Gent. Infant floppy at birth, Code APGAR called with NICU response. Cord was cut and infant handed to waiting nurse team. NICU arrived shortly afterwards. Inspected mother, no tears appreciated, bleeding minimal and uterus firm. Hemostatic at time of leaving room. EBL 250  Jody Perez RYAN 08/18/2013, 3:06 AM

## 2012-12-30 ENCOUNTER — Encounter (HOSPITAL_COMMUNITY): Payer: Self-pay | Admitting: *Deleted

## 2012-12-30 ENCOUNTER — Inpatient Hospital Stay (HOSPITAL_COMMUNITY)
Admission: AD | Admit: 2012-12-30 | Discharge: 2012-12-31 | Disposition: A | Discharge status: Hospice | Payer: Medicaid Other | Source: Ambulatory Visit | Attending: Obstetrics & Gynecology | Admitting: Obstetrics & Gynecology

## 2012-12-30 DIAGNOSIS — O99891 Other specified diseases and conditions complicating pregnancy: Secondary | ICD-10-CM | POA: Insufficient documentation

## 2012-12-30 DIAGNOSIS — Z3490 Encounter for supervision of normal pregnancy, unspecified, unspecified trimester: Secondary | ICD-10-CM

## 2012-12-30 DIAGNOSIS — R109 Unspecified abdominal pain: Secondary | ICD-10-CM | POA: Insufficient documentation

## 2012-12-30 DIAGNOSIS — N949 Unspecified condition associated with female genital organs and menstrual cycle: Secondary | ICD-10-CM | POA: Insufficient documentation

## 2012-12-30 LAB — CBC
HCT: 37.1 % (ref 36.0–46.0)
MCV: 89 fL (ref 78.0–100.0)
RDW: 12.8 % (ref 11.5–15.5)
WBC: 14.4 10*3/uL — ABNORMAL HIGH (ref 4.0–10.5)

## 2012-12-30 LAB — URINE MICROSCOPIC-ADD ON

## 2012-12-30 LAB — URINALYSIS, ROUTINE W REFLEX MICROSCOPIC
Bilirubin Urine: NEGATIVE
Ketones, ur: NEGATIVE mg/dL
Protein, ur: NEGATIVE mg/dL
Specific Gravity, Urine: 1.015 (ref 1.005–1.030)
Urobilinogen, UA: 0.2 mg/dL (ref 0.0–1.0)

## 2012-12-30 NOTE — MAU Provider Note (Addendum)
History     CSN: [id number]  Arrival date and time: 12/30/12 2158   First Provider Initiated Contact with Patient 12/30/12 2322      Chief Complaint  Patient presents with  . Abdominal Pain  . Vaginal Bleeding   HPI 21 y.o. G2P0010 at [redacted] weeks EGA by unsure LMP. Reports pink vaginal discharge and cramping x 2 days. Diagnosed with BV last week at [facility], started flagyl, took x 4 days, then stopped yesterday because she thought it might be causing the pink discharge. Had GC collected at HD too.   Past Medical History  Diagnosis Date  . Chlamydia   . BV (bacterial vaginosis)   . Trichomonas   . UTI (lower urinary tract infection)   . HSV infection   . Herpes genitalis in women   . Miscarriage     Past Surgical History  Procedure Date  . No past surgeries     Family History  Problem Relation Age of Onset  . Hypertension Mother   . Hypertension Brother     History  Substance Use Topics  . Smoking status: Never Smoker   . Smokeless tobacco: Not on file  . Alcohol Use: No    Allergies: No Known Allergies  Prescriptions prior to admission  Medication Sig Dispense Refill  . metroNIDAZOLE (FLAGYL) 500 MG tablet Take 500 mg by mouth 3 (three) times daily.      [name] acyclovir (ZOVIRAX) 400 MG tablet Take 1 tablet (400 mg total) by mouth 3 (three) times daily. For five days.  May repeat as needed.  50 tablet  2    Review of Systems  Constitutional: Negative.   Respiratory: Negative.   Cardiovascular: Negative.   Gastrointestinal: Positive for abdominal pain (cramping). Negative for nausea, vomiting, diarrhea and constipation.  Genitourinary: Negative for dysuria, urgency, frequency, hematuria and flank pain.       + pink vaginal discharge   Musculoskeletal: Negative.   Neurological: Negative.   Psychiatric/Behavioral: Negative.    Physical Exam   Blood pressure 136/79, pulse 96, temperature 98.2 F (36.8 C), temperature source Oral, resp. rate 16, height 5'  3" (1.6 m), weight 162 lb 6.4 oz (73.664 kg), last menstrual period 10/01/2012.  Physical Exam  Nursing note and vitals reviewed. Constitutional: She is oriented to person, place, and time. She appears well-developed and well-nourished. No distress.  Cardiovascular: Normal rate.   Respiratory: Effort normal.  GI: Soft. There is no tenderness.  Genitourinary: No bleeding around the vagina. Vaginal discharge (thick, white) found.  Musculoskeletal: Normal range of motion.  Neurological: She is alert and oriented to person, place, and time.  Skin: Skin is warm and dry.  Psychiatric: She has a normal mood and affect.    MAU Course  Procedures Results for orders placed during the hospital encounter of 12/30/12 (from the past 24 hour(s))  URINALYSIS, ROUTINE W REFLEX MICROSCOPIC     Status: Abnormal   Collection Time   12/30/12 10:15 PM      Component Value Range   Color, Urine YELLOW  YELLOW   APPearance CLEAR  CLEAR   Specific Gravity, Urine 1.015  1.005 - 1.030   pH 7.0  5.0 - 8.0   Glucose, UA NEGATIVE  NEGATIVE mg/dL   Hgb urine dipstick TRACE (*) NEGATIVE   Bilirubin Urine NEGATIVE  NEGATIVE   Ketones, ur NEGATIVE  NEGATIVE mg/dL   Protein, ur NEGATIVE  NEGATIVE mg/dL   Urobilinogen, UA 0.2  0.0 - 1.0 mg/dL  Nitrite NEGATIVE  NEGATIVE   Leukocytes, UA NEGATIVE  NEGATIVE  URINE MICROSCOPIC-ADD ON     Status: Normal   Collection Time   12/30/12 10:15 PM      Component Value Range   Squamous Epithelial / LPF RARE  RARE   WBC, UA 0-2  <3 WBC/hpf   RBC / HPF 0-2  <3 RBC/hpf   Urine-Other MUCOUS PRESENT    CBC     Status: Abnormal   Collection Time   12/30/12 10:35 PM      Component Value Range   WBC 14.4 (*) 4.0 - 10.5 K/uL   RBC 4.17  3.87 - 5.11 MIL/uL   Hemoglobin 12.4  12.0 - 15.0 g/dL   HCT 40.9  81.1 - 91.4 %   MCV 89.0  78.0 - 100.0 fL   MCH 29.7  26.0 - 34.0 pg   MCHC 33.4  30.0 - 36.0 g/dL   RDW 78.2  95.6 - 21.3 %   Platelets 307  150 - 400 K/uL  POCT PREGNANCY,  URINE     Status: Abnormal   Collection Time   12/30/12 10:39 PM      Component Value Range   Preg Test, Ur POSITIVE (*) NEGATIVE   Unable to doppler FHT  Assessment and Plan  Care assumed by Wynelle Bourgeois, CNM U/S pending  FRAZIER,NATALIE 12/30/2012, 11:31 PM   US Ob Comp Less 14 Wks  12/31/2012  *RADIOLOGY REPORT*  Clinical Data: Pain and spotting.  Unsure of dates.  Estimated gestational age by LMP is 13 weeks 0 days.  Quantitative beta HCG is 70,979.  OBSTETRIC <14 WK ULTRASOUND  Technique:  Transabdominal ultrasound was performed for evaluation of the gestation as well as the maternal uterus and adnexal regions.  Comparison:  None.  Intrauterine gestational sac: A single intrauterine gestational sac is visualized in the fundus. Yolk sac: The yolk sac is visualized. Embryo: The fetal pole is visualized. Cardiac Activity: Fetal cardiac activity is visualized. Heart Rate: 132 bpm  CRL:  6.2 mm  6 w  4 d        [place] EDC: 08/22/2013  Maternal uterus/Adnexae: The uterus is anteverted.  No masses demonstrated.  No subchorionic hemorrhage.  The right ovary measures 5.7 x 3.6 x 4.1 cm and contains a simple cyst measuring 3.7 cm maximal diameter.  The left ovary measures 3.5 x 2.2 x 2 cm.  Flow is demonstrated in both ovaries on color flow Doppler imaging.  No free pelvic fluid collections.  IMPRESSION: Single intrauterine pregnancy demonstrated with estimated gestational age by crown-rump length of 6 weeks 4 days.   Original Report Authenticated By: Burman Nieves, M.D.    Will discharge home and recommend she start Prenatal Care

## 2012-12-30 NOTE — MAU Note (Signed)
Pt LMP 10/01/2012, +UPT at [facility].  Pt having pink discharge and lower abd pain x 2 days.

## 2012-12-31 ENCOUNTER — Inpatient Hospital Stay (HOSPITAL_COMMUNITY): Payer: Medicaid Other

## 2012-12-31 ENCOUNTER — Encounter: Payer: Self-pay | Admitting: Obstetrics & Gynecology

## 2012-12-31 ENCOUNTER — Encounter (HOSPITAL_COMMUNITY): Payer: Self-pay | Admitting: Radiology

## 2012-12-31 DIAGNOSIS — N898 Other specified noninflammatory disorders of vagina: Secondary | ICD-10-CM

## 2012-12-31 DIAGNOSIS — R1084 Generalized abdominal pain: Secondary | ICD-10-CM

## 2012-12-31 IMAGING — US US OB COMP LESS 14 WK
1 series · 14 of 22 positions shown · non-contrast
Comparison: None.

CLINICAL DATA: Pain and spotting.  Unsure of dates.  Estimated
gestational age by LMP is 13 weeks 0 days.  Quantitative beta HCG
is [DATE].

OBSTETRIC <14 WK ULTRASOUND
TECHNIQUE: Transabdominal ultrasound was performed for evaluation
of the gestation as well as the maternal uterus and adnexal
regions.

[Series 1: us ob transvaginal · 22 acquisitions, 14 frames shown]
[im 1/22]
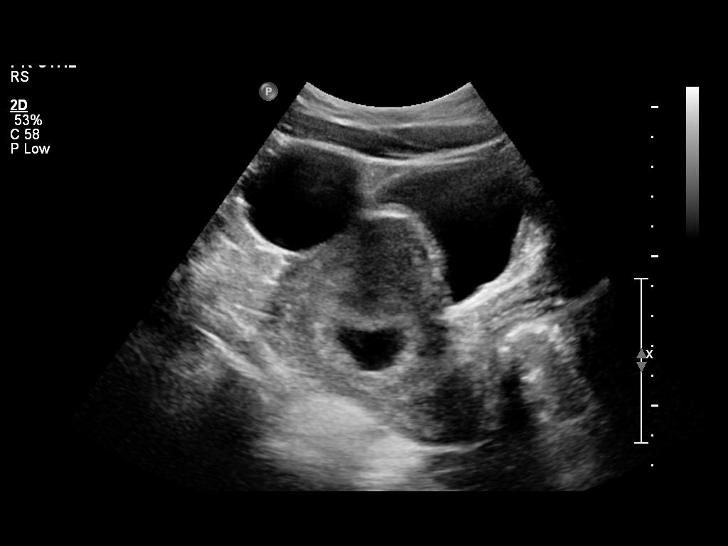
[im 3/22]
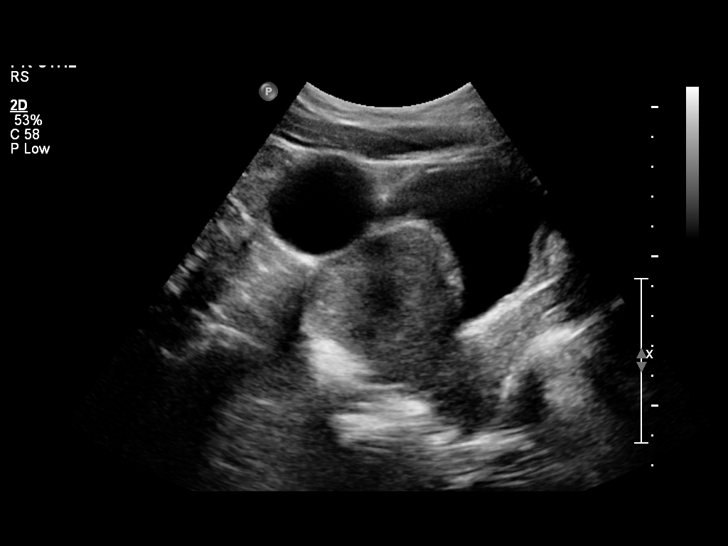
[im 4/22]
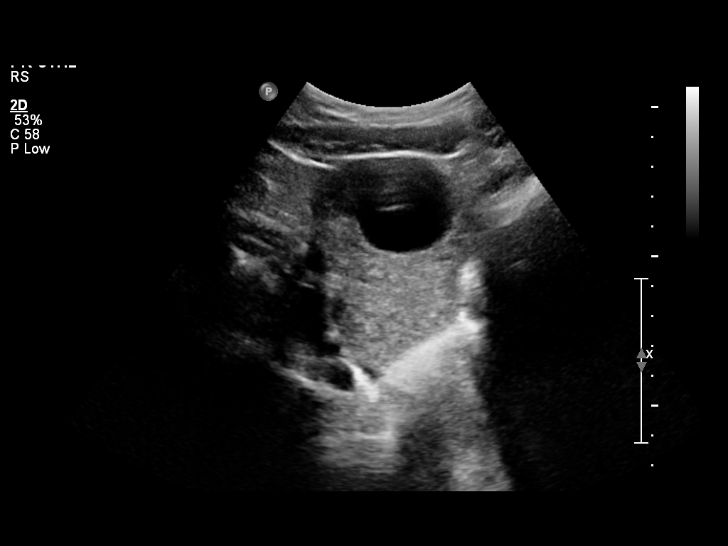
[im 6/22]
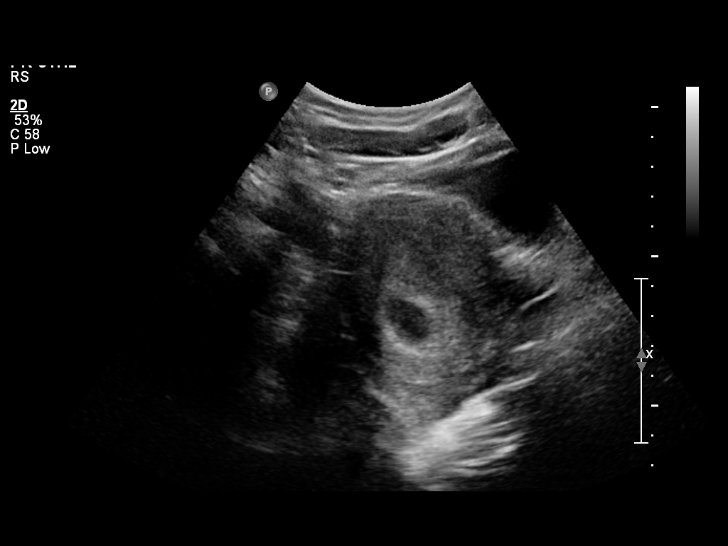
[im 8/22]
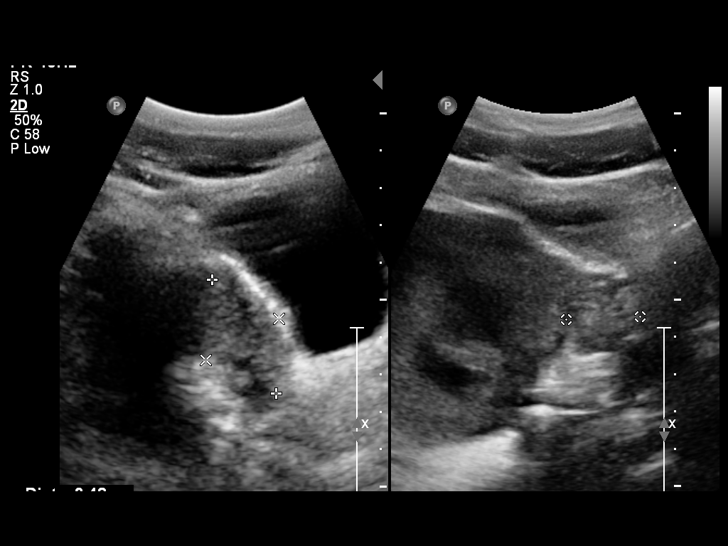
[im 9/22]
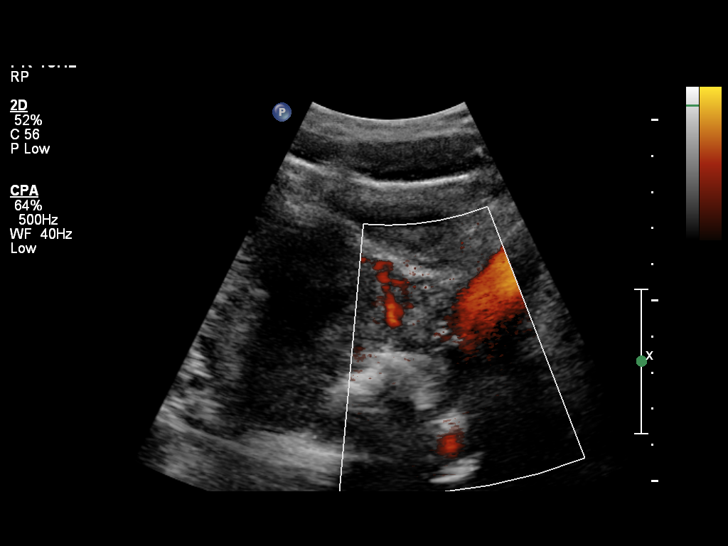
[im 11/22]
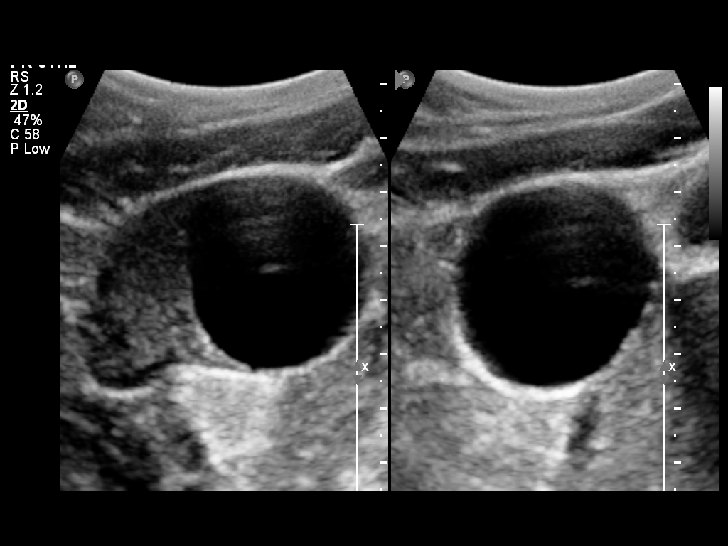
[im 12/22]
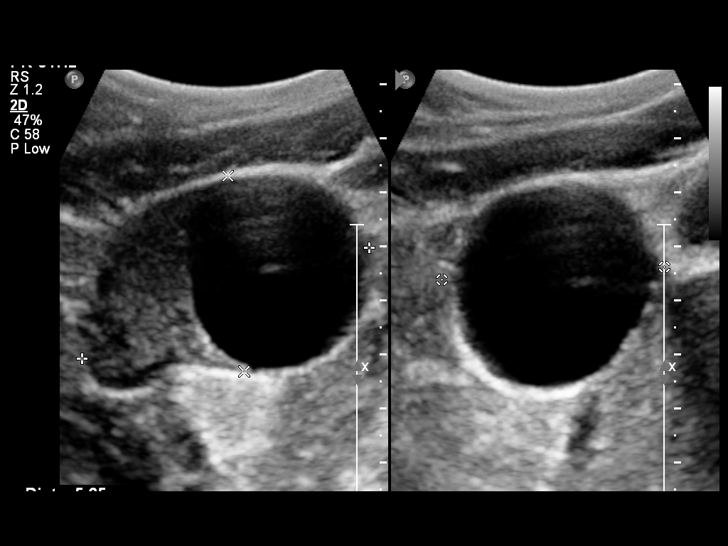
[im 14/22]
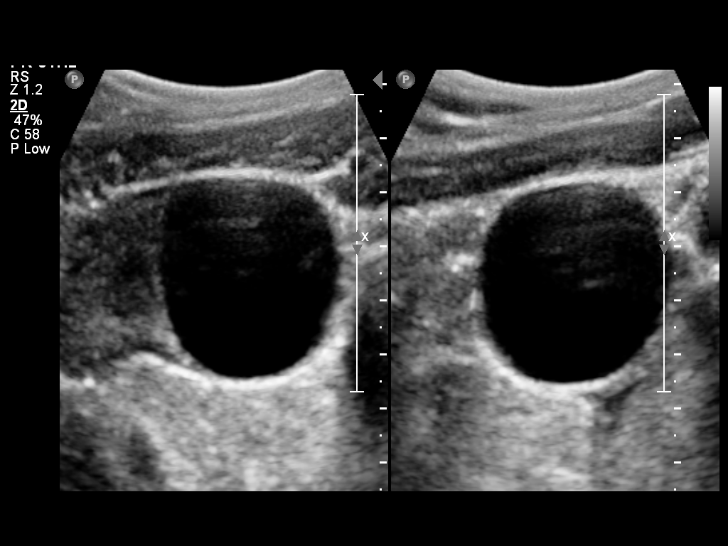
[im 15/22]
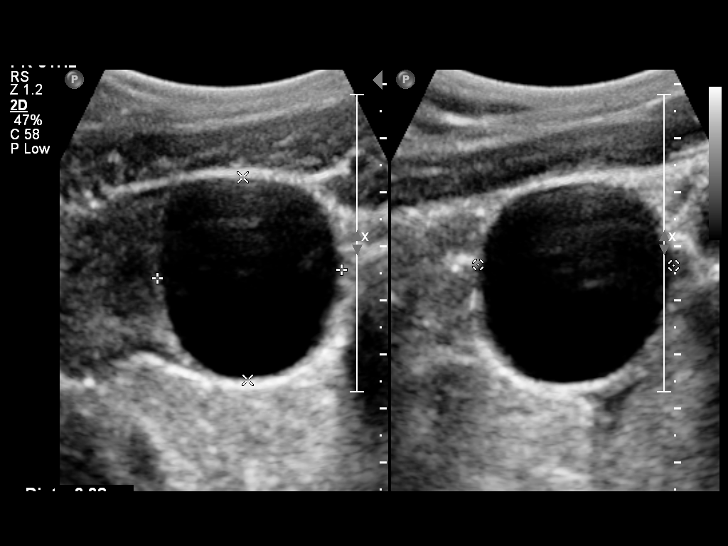
[im 17/22]
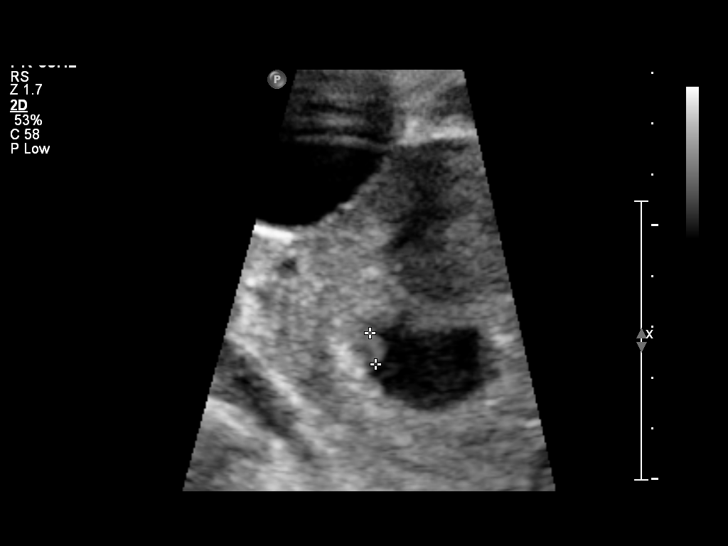
[im 19/22]
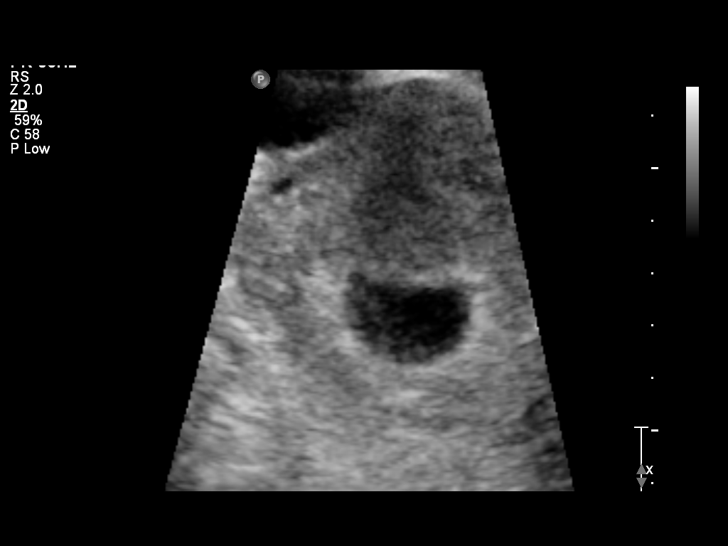
[im 20/22]
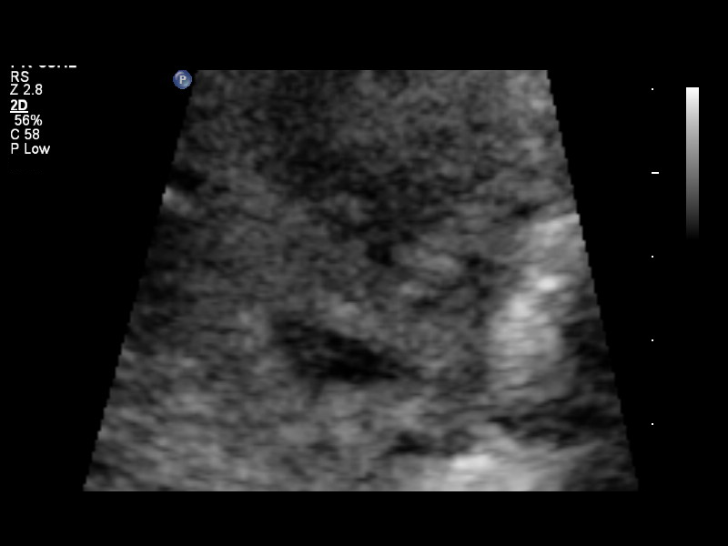
[im 22/22]
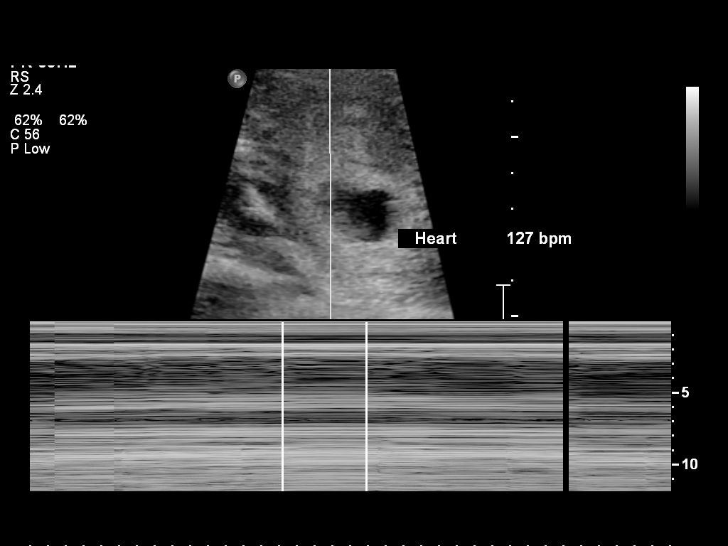

[14 of 22 positions shown; findings below may reference images not displayed]

Intrauterine gestational sac: A single intrauterine gestational sac
is visualized in the fundus.
Yolk sac: The yolk sac is visualized.
Embryo: The fetal pole is visualized.
Cardiac Activity: Fetal cardiac activity is visualized.
Heart Rate: 132 bpm

CRL:  6.2 mm  6 w  4 d        US EDC: [DATE]

Maternal uterus/Adnexae:
The uterus is anteverted.  No masses demonstrated.  No subchorionic
hemorrhage.  The right ovary measures 5.7 x 3.6 x 4.1 cm and
contains a simple cyst measuring 3.7 cm maximal diameter.  The left
ovary measures 3.5 x 2.2 x 2 cm.  Flow is demonstrated in both
ovaries on color flow Doppler imaging.  No free pelvic fluid
collections.
IMPRESSION: Single intrauterine pregnancy demonstrated with estimated
gestational age by crown-rump length of 6 weeks 4 days.

## 2012-12-31 NOTE — MAU Provider Note (Signed)
Attestation of Attending Supervision of Advanced Practitioner (PA/CNM/NP): Evaluation and management procedures were performed by the Advanced Practitioner under my supervision and collaboration.  I have reviewed the Advanced Practitioner's note and chart, and I agree with the management and plan.  Ola Fawver, MD, FACOG Attending Obstetrician & Gynecologist Faculty Practice, [facility]  

## 2012-12-31 NOTE — MAU Provider Note (Signed)
Attestation of Attending Supervision of Advanced Practitioner (PA/CNM/NP): Evaluation and management procedures were performed by the Advanced Practitioner under my supervision and collaboration.  I have reviewed the Advanced Practitioner's note and chart, and I agree with the management and plan.  Jevin Camino, MD, FACOG Attending Obstetrician & Gynecologist Faculty Practice, [facility]  

## 2013-01-28 ENCOUNTER — Other Ambulatory Visit: Payer: Self-pay | Admitting: Obstetrics & Gynecology

## 2013-01-28 ENCOUNTER — Other Ambulatory Visit (HOSPITAL_COMMUNITY)
Admission: RE | Admit: 2013-01-28 | Discharge: 2013-01-28 | Disposition: A | Discharge status: Home / Self Care | Payer: Medicaid Other | Source: Ambulatory Visit | Attending: Obstetrics & Gynecology | Admitting: Obstetrics & Gynecology

## 2013-01-28 ENCOUNTER — Encounter: Payer: Self-pay | Admitting: Obstetrics & Gynecology

## 2013-01-28 ENCOUNTER — Ambulatory Visit (INDEPENDENT_AMBULATORY_CARE_PROVIDER_SITE_OTHER): Payer: Medicaid Other | Admitting: Obstetrics & Gynecology

## 2013-01-28 VITALS — BP 128/69 | Temp 97.5°F | Wt 155.3 lb

## 2013-01-28 DIAGNOSIS — Z3490 Encounter for supervision of normal pregnancy, unspecified, unspecified trimester: Secondary | ICD-10-CM | POA: Insufficient documentation

## 2013-01-28 DIAGNOSIS — Z113 Encounter for screening for infections with a predominantly sexual mode of transmission: Secondary | ICD-10-CM | POA: Insufficient documentation

## 2013-01-28 DIAGNOSIS — O219 Vomiting of pregnancy, unspecified: Secondary | ICD-10-CM

## 2013-01-28 DIAGNOSIS — N760 Acute vaginitis: Secondary | ICD-10-CM | POA: Insufficient documentation

## 2013-01-28 DIAGNOSIS — Z8619 Personal history of other infectious and parasitic diseases: Secondary | ICD-10-CM | POA: Insufficient documentation

## 2013-01-28 DIAGNOSIS — O9989 Other specified diseases and conditions complicating pregnancy, childbirth and the puerperium: Secondary | ICD-10-CM

## 2013-01-28 LAB — HIV ANTIBODY (ROUTINE TESTING W REFLEX): HIV: NONREACTIVE

## 2013-01-28 MED ORDER — PROMETHAZINE HCL 25 MG PO TABS: 25.0000 mg | ORAL_TABLET | Freq: Four times a day (QID) | ORAL | Status: DC | PRN

## 2013-01-28 MED ORDER — LIDOCAINE HCL 2 % EX GEL: Freq: Three times a day (TID) | CUTANEOUS | Status: DC

## 2013-01-28 MED ORDER — VALACYCLOVIR HCL 500 MG PO TABS: ORAL_TABLET | ORAL | Status: DC

## 2013-01-28 NOTE — Progress Notes (Signed)
   Subjective:    Shaniya Tashiro is a G2P0010 [redacted]w[redacted]d being seen today for her first obstetrical visit.  Patient does intend to breast feed. Pregnancy history fully reviewed.   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  Also reports nausea and vomiting; did have significant emesis during this visit today, and wants medication. No other concerns.  Filed Vitals:   01/28/13 1012  BP: 128/69  Temp: 97.5 F (36.4 C)  Weight: 155 lb 4.8 oz (70.444 kg)    HISTORY: OB History   Grav Para Term Preterm Abortions TAB SAB Ect Mult Living   2    1  1    0     # Outc Date GA Lbr Len/2nd Wgt Sex Del Anes PTL Lv   1 SAB 2013           Comments: System Generated. Please review and update pregnancy details.   2 CUR              Past Medical History  Diagnosis Date  . Chlamydia   . BV (bacterial vaginosis)   . Trichomonas   . UTI (lower urinary tract infection)   . HSV infection   . Herpes genitalis in women   . Miscarriage   . Medical history non-contributory    Past Surgical History  Procedure Laterality Date  . No past surgeries     Family History  Problem Relation Age of Onset  . Hypertension Mother   . Hypertension Brother     Exam    Uterus:     Pelvic Exam:    Perineum: No Hemorrhoids, active HSV lesions in bilateral labia and posterior fourchette, erythematous.  Yellow discharge noted at introitus.   Vulva: As above   Vagina:  Yellow discharge   Cervix: Not evaluated due to pain from HSV   Adnexa: not evaluated   Bony Pelvis: Not evaluated  System: Breast:  normal appearance, no masses or tenderness   Skin: normal coloration and turgor, no rashes    Neurologic: normal   Extremities: normal strength, tone, and muscle mass   HEENT PERRLA and extra ocular movement intact   Mouth/Teeth mucous membranes moist, pharynx normal without lesions and dental hygiene good   Neck supple and no masses   Cardiovascular: regular rate and rhythm   Respiratory:  appears well, vitals normal, no respiratory distress, acyanotic, normal RR   Abdomen: soft, non-tender; bowel sounds normal; no masses,  no organomegaly   Urinary: urethral meatus normal      Assessment:    Pregnancy: G2P0010 Patient Active Problem List  Diagnosis  . Supervision of normal pregnancy  . Genital HSV, antepartum      Plan:     Initial labs drawn; will check GC/Chlam and wet prep labs on the [facility] Continue prenatal vitamins; promethazine prescribed prn nausea. Valtrex prescribed for outbreak, will continue on suppression for rest of pregnancy.  Topical lidocaine also ordered. Problem list reviewed and updated. Genetic Screening discussed Quad Screen: will be ordered later. Ultrasound discussed; fetal survey: will be ordered later. Follow up in 4 weeks.  ANYANWU,UGONNA A 01/28/2013

## 2013-01-28 NOTE — Patient Instructions (Addendum)
Pregnancy - First Trimester During sexual intercourse, millions of sperm go into the vagina. Only 1 sperm will penetrate and fertilize the female egg while it is in the Fallopian tube. One week later, the fertilized egg implants into the wall of the uterus. An embryo begins to develop into a baby. At 6 to 8 weeks, the eyes and face are formed and the heartbeat can be seen on ultrasound. At the end of 12 weeks (first trimester), all the baby's organs are formed. Now that you are pregnant, you will want to do everything you can to have a healthy baby. Two of the most important things are to get good prenatal care and follow your caregiver's instructions. Prenatal care is all the medical care you receive before the baby's birth. It is given to prevent, find, and treat problems during the pregnancy and childbirth. PRENATAL EXAMS  During prenatal visits, your weight, blood pressure and urine are checked. This is done to make sure you are healthy and progressing normally during the pregnancy.  A pregnant woman should gain 25 to 35 pounds during the pregnancy. However, if you are over weight or underweight, your caregiver will advise you regarding your weight.  Your caregiver will ask and answer questions for you.  Blood work, cervical cultures, other necessary tests and a Pap test are done during your prenatal exams. These tests are done to check on your health and the probable health of your baby. Tests are strongly recommended and done for HIV with your permission. This is the virus that causes AIDS. These tests are done because medications can be given to help prevent your baby from being born with this infection should you have been infected without knowing it. Blood work is also used to find out your blood type, previous infections and follow your blood levels (hemoglobin).  Low hemoglobin (anemia) is common during pregnancy. Iron and vitamins are given to help prevent this. Later in the pregnancy,  blood tests for diabetes will be done along with any other tests if any problems develop. You may need tests to make sure you and the baby are doing well.  You may need other tests to make sure you and the baby are doing well. CHANGES DURING THE FIRST TRIMESTER (THE FIRST 3 MONTHS OF PREGNANCY) Your body goes through many changes during pregnancy. They vary from person to person. Talk to your caregiver about changes you notice and are concerned about. Changes can include:  Your menstrual period stops.  The egg and sperm carry the genes that determine what you look like. Genes from you and your partner are forming a baby. The female genes determine whether the baby is a boy or a girl.  Your body increases in girth and you may feel bloated.  Feeling sick to your stomach (nauseous) and throwing up (vomiting). If the vomiting is uncontrollable, call your caregiver.  Your breasts will begin to enlarge and become tender.  Your nipples may stick out more and become darker.  The need to urinate more. Painful urination may mean you have a bladder infection.  Tiring easily.  Loss of appetite.  Cravings for certain kinds of food.  At first, you may gain or lose a couple of pounds.  You may have changes in your emotions from day to day (excited to be pregnant or concerned something may go wrong with the pregnancy and baby).  You may have more vivid and strange dreams. HOME CARE INSTRUCTIONS   It is very important  to avoid all smoking, alcohol and un-prescribed drugs during your pregnancy. These affect the formation and growth of the baby. Avoid chemicals while pregnant to ensure the delivery of a healthy infant.  Start your prenatal visits by the 12th week of pregnancy. They are usually scheduled monthly at first, then more often in the last 2 months before delivery. Keep your caregiver's appointments. Follow your caregiver's instructions regarding medication use, blood and lab tests, exercise,  and diet.  During pregnancy, you are providing food for you and your baby. Eat regular, well-balanced meals. Choose foods such as meat, fish, milk and other low fat dairy products, vegetables, fruits, and whole-grain breads and cereals. Your caregiver will tell you of the ideal weight gain.  You can help morning sickness by keeping soda crackers at the bedside. Eat a couple before arising in the morning. You may want to use the crackers without salt on them.  Eating 4 to 5 small meals rather than 3 large meals a day also may help the nausea and vomiting.  Drinking liquids between meals instead of during meals also seems to help nausea and vomiting.  A physical sexual relationship may be continued throughout pregnancy if there are no other problems. Problems may be early (premature) leaking of amniotic fluid from the membranes, vaginal bleeding, or belly (abdominal) pain.  Exercise regularly if there are no restrictions. Check with your caregiver or physical therapist if you are unsure of the safety of some of your exercises. Greater weight gain will occur in the last 2 trimesters of pregnancy. Exercising will help:  Control your weight.  Keep you in shape.  Prepare you for labor and delivery.  Help you lose your pregnancy weight after you deliver your baby.  Wear a good support or jogging bra for breast tenderness during pregnancy. This may help if worn during sleep too.  Ask when prenatal classes are available. Begin classes when they are offered.  Do not use hot tubs, steam rooms or saunas.  Wear your seat belt when driving. This protects you and your baby if you are in an accident.  Avoid raw meat, uncooked cheese, cat litter boxes and soil used by cats throughout the pregnancy. These carry germs that can cause birth defects in the baby.  The first trimester is a good time to visit your dentist for your dental health. Getting your teeth cleaned is OK. Use a softer toothbrush and  brush gently during pregnancy.  Ask for help if you have financial, counseling or nutritional needs during pregnancy. Your caregiver will be able to offer counseling for these needs as well as refer you for other special needs.  Do not take any medications or herbs unless told by your caregiver.  Inform your caregiver if there is any mental or physical domestic violence.  Make a list of emergency phone numbers of family, friends, hospital, and police and fire departments.  Write down your questions. Take them to your prenatal visit.  Do not douche.  Do not cross your legs.  If you have to stand for long periods of time, rotate you feet or take small steps in a circle.  You may have more vaginal secretions that may require a sanitary pad. Do not use tampons or scented sanitary pads. MEDICATIONS AND DRUG USE IN PREGNANCY  Take prenatal vitamins as directed. The vitamin should contain 1 milligram of folic acid. Keep all vitamins out of reach of children. Only a couple vitamins or tablets containing iron may be  fatal to a baby or young child when ingested.  Avoid use of all medications, including herbs, over-the-counter medications, not prescribed or suggested by your caregiver. Only take over-the-counter or prescription medicines for pain, discomfort, or fever as directed by your caregiver. Do not use aspirin, ibuprofen, or naproxen unless directed by your caregiver.  Let your caregiver also know about herbs you may be using.  Alcohol is related to a number of birth defects. This includes fetal alcohol syndrome. All alcohol, in any form, should be avoided completely. Smoking will cause low birth rate and premature babies.  Street or illegal drugs are very harmful to the baby. They are absolutely forbidden. A baby born to an addicted mother will be addicted at birth. The baby will go through the same withdrawal an adult does.  Let your caregiver know about any medications that you have to  take and for what reason you take them. MISCARRIAGE IS COMMON DURING PREGNANCY A miscarriage does not mean you did something wrong. It is not a reason to worry about getting pregnant again. Your caregiver will help you with questions you may have. If you have a miscarriage, you may need minor surgery. SEEK MEDICAL CARE IF:  You have any concerns or worries during your pregnancy. It is better to call with your questions if you feel they cannot wait, rather than worry about them. SEEK IMMEDIATE MEDICAL CARE IF:   An unexplained oral temperature above 102 F (38.9 C) develops, or as your caregiver suggests.  You have leaking of fluid from the vagina (birth canal). If leaking membranes are suspected, take your temperature and inform your caregiver of this when you call.  There is vaginal spotting or bleeding. Notify your caregiver of the amount and how many pads are used.  You develop a bad smelling vaginal discharge with a change in the color.  You continue to feel sick to your stomach (nauseated) and have no relief from remedies suggested. You vomit blood or coffee ground-like materials.  You lose more than 2 pounds of weight in 1 week.  You gain more than 2 pounds of weight in 1 week and you notice swelling of your face, hands, feet, or legs.  You gain 5 pounds or more in 1 week (even if you do not have swelling of your hands, face, legs, or feet).  You get exposed to [place] measles and have never had them.  You are exposed to fifth disease or chickenpox.  You develop belly (abdominal) pain. Round ligament discomfort is a common non-cancerous (benign) cause of abdominal pain in pregnancy. Your caregiver still must evaluate this.  You develop headache, fever, diarrhea, pain with urination, or shortness of breath.  You fall or are in a car accident or have any kind of trauma.  There is mental or physical violence in your home. Document Released: 11/07/2001 Document Revised: 02/05/2012  Document Reviewed: 05/11/2009 [facility] Patient Information 2013 [place], [place].   Morning Sickness Morning sickness is when you feel sick to your stomach (nauseous) during pregnancy. This nauseous feeling may or may not come with throwing up (vomiting). It often occurs in the morning, but can be a problem any time of day. While morning sickness is unpleasant, it is usually harmless unless you develop severe and continual vomiting (hyperemesis gravidarum). This condition requires more intense treatment. CAUSES  The cause of morning sickness is not completely known but seems to be related to a sudden increase of two hormones:   Human chorionic gonadotropin (hCG).  Estrogen hormone. These are elevated in the first part of the pregnancy. TREATMENT  Do not use any medicines (prescription, over-the-counter, or herbal) for morning sickness without first talking to your caregiver. Some patients are helped by the following:  Vitamin B6 (25mg  every 8 hours) or vitamin B6 shots.  An antihistamine called doxylamine (10mg  every 8 hours).  The herbal medication ginger. HOME CARE INSTRUCTIONS   Taking multivitamins before getting pregnant can prevent or decrease the severity of morning sickness in most women.  Eat a piece of dry toast or unsalted crackers before getting out of bed in the morning.  Eat 5 or 6 small meals a day.  Eat dry and bland foods (rice, baked potato).  Do not drink liquids with your meals. Drink liquids between meals.  Avoid greasy, fatty, and spicy foods.  Get someone to cook for you if the smell of any food causes nausea and vomiting.  Avoid vitamin pills with iron because iron can cause nausea.  Snack on protein foods between meals if you are hungry.  Eat unsweetened gelatins for deserts.  Wear an acupressure wristband (worn for sea sickness) may be helpful.  Acupuncture may be helpful.  Do not smoke.  Get a humidifier to keep the air in your house free of  odors. SEEK MEDICAL CARE IF:   Your home remedies are not working and you need medication.  You feel dizzy or lightheaded.  You are losing weight.  You need help with your diet. SEEK IMMEDIATE MEDICAL CARE IF:   You have persistent and uncontrolled nausea and vomiting.  You pass out (faint).  You have a fever. MAKE SURE YOU:   Understand these instructions.  Will watch your condition.  Will get help right away if you are not doing well or get worse. Document Released: 01/04/2007 Document Revised: 02/05/2012 Document Reviewed: 11/01/2007 [facility] Patient Information 2013 [place], [place].

## 2013-01-28 NOTE — Progress Notes (Signed)
Pulse- 90 New ob packet given Flu declined

## 2013-01-29 LAB — OBSTETRIC PANEL
Basophils Absolute: 0 10*3/uL (ref 0.0–0.1)
HCT: 36.2 % (ref 36.0–46.0)
Lymphocytes Relative: 14 % (ref 12–46)
Monocytes Absolute: 0.4 10*3/uL (ref 0.1–1.0)
Neutro Abs: 10.5 10*3/uL — ABNORMAL HIGH (ref 1.7–7.7)
Platelets: 331 10*3/uL (ref 150–400)
RDW: 13.9 % (ref 11.5–15.5)
Rubella: 4.82 Index — ABNORMAL HIGH (ref <0.90)
WBC: 12.7 10*3/uL — ABNORMAL HIGH (ref 4.0–10.5)

## 2013-01-30 LAB — HEMOGLOBINOPATHY EVALUATION
Hemoglobin Other: 0 %
Hgb A2 Quant: 2.6 % (ref 2.2–3.2)
Hgb F Quant: 0 % (ref 0.0–2.0)
Hgb S Quant: 0 %

## 2013-02-06 MED ORDER — METRONIDAZOLE 500 MG PO TABS: 500.0000 mg | ORAL_TABLET | Freq: Two times a day (BID) | ORAL | Status: AC

## 2013-02-06 MED ORDER — FLUCONAZOLE 150 MG PO TABS: 150.0000 mg | ORAL_TABLET | Freq: Once | ORAL | Status: DC

## 2013-02-06 NOTE — Addendum Note (Signed)
Addended by: Jaynie Collins A on: 02/06/2013 06:10 PM   Modules accepted: Orders

## 2013-02-10 ENCOUNTER — Telehealth: Payer: Self-pay | Admitting: General Practice

## 2013-02-10 NOTE — Telephone Encounter (Signed)
Called patient, no answer- left message to call us back at the clinics 

## 2013-02-10 NOTE — Telephone Encounter (Signed)
Message copied by Kathee Delton on Mon Feb 10, 2013 11:24 AM ------      Message from: Jaynie Collins A      Created: Thu Feb 06, 2013  6:10 PM       Wet prep showed BV and yeast, meds prescribed.  Please call to inform patient of results.       ------

## 2013-02-11 NOTE — Telephone Encounter (Signed)
Called pt and left message that I am calling with test result information. Please call back and indicate when she can be reached or if it is ok to leave detailed information on her voice mail.

## 2013-02-12 NOTE — Telephone Encounter (Signed)
Patient called back results given.

## 2013-02-20 ENCOUNTER — Encounter (HOSPITAL_COMMUNITY): Payer: Self-pay | Admitting: *Deleted

## 2013-02-20 ENCOUNTER — Inpatient Hospital Stay (HOSPITAL_COMMUNITY)
Admission: AD | Admit: 2013-02-20 | Discharge: 2013-02-20 | Disposition: A | Discharge status: Home / Self Care | Payer: Medicaid Other | Source: Ambulatory Visit | Attending: Obstetrics and Gynecology | Admitting: Obstetrics and Gynecology

## 2013-02-20 DIAGNOSIS — O210 Mild hyperemesis gravidarum: Secondary | ICD-10-CM | POA: Insufficient documentation

## 2013-02-20 DIAGNOSIS — O219 Vomiting of pregnancy, unspecified: Secondary | ICD-10-CM

## 2013-02-20 LAB — COMPREHENSIVE METABOLIC PANEL
ALT: 10 U/L (ref 0–35)
AST: 14 U/L (ref 0–37)
Albumin: 3.1 g/dL — ABNORMAL LOW (ref 3.5–5.2)
Alkaline Phosphatase: 63 U/L (ref 39–117)
BUN: 4 mg/dL — ABNORMAL LOW (ref 6–23)
CO2: 23 mEq/L (ref 19–32)
Calcium: 8.9 mg/dL (ref 8.4–10.5)
Chloride: 98 mEq/L (ref 96–112)
Creatinine, Ser: 0.7 mg/dL (ref 0.50–1.10)
GFR calc Af Amer: >90 mL/min (ref >90)
GFR calc non Af Amer: >90 mL/min (ref >90)
Glucose, Bld: 71 mg/dL (ref 70–99)
Potassium: 3.1 mEq/L — ABNORMAL LOW (ref 3.5–5.1)
Sodium: 133 mEq/L — ABNORMAL LOW (ref 135–145)
Total Bilirubin: 0.2 mg/dL — ABNORMAL LOW (ref 0.3–1.2)
Total Protein: 7.5 g/dL (ref 6.0–8.3)

## 2013-02-20 LAB — CBC
HCT: 35 % — ABNORMAL LOW (ref 36.0–46.0)
Hemoglobin: 12 g/dL (ref 12.0–15.0)
MCH: 30.6 pg (ref 26.0–34.0)
MCHC: 34.3 g/dL (ref 30.0–36.0)
MCV: 89.3 fL (ref 78.0–100.0)
Platelets: 295 10*3/uL (ref 150–400)
RBC: 3.92 MIL/uL (ref 3.87–5.11)
RDW: 12.9 % (ref 11.5–15.5)
WBC: 11.8 10*3/uL — ABNORMAL HIGH (ref 4.0–10.5)

## 2013-02-20 LAB — URINE MICROSCOPIC-ADD ON

## 2013-02-20 LAB — URINALYSIS, ROUTINE W REFLEX MICROSCOPIC
Ketones, ur: NEGATIVE mg/dL
Nitrite: NEGATIVE
Protein, ur: NEGATIVE mg/dL

## 2013-02-20 MED ORDER — FAMOTIDINE 20 MG PO TABS
40.0000 mg | ORAL_TABLET | Freq: Once | ORAL | Status: AC
  Administered 2013-02-20: 40 mg via ORAL
  Filled 2013-02-20 (×2): qty 1

## 2013-02-20 MED ORDER — ONDANSETRON 4 MG PO TBDP
4.0000 mg | ORAL_TABLET | Freq: Once | ORAL | Status: AC
  Administered 2013-02-20: 4 mg via ORAL
  Filled 2013-02-20: qty 1

## 2013-02-20 MED ORDER — PROMETHAZINE HCL 12.5 MG PO TABS: 12.5000 mg | ORAL_TABLET | Freq: Four times a day (QID) | ORAL | Status: DC | PRN

## 2013-02-20 NOTE — MAU Note (Signed)
Patient states she has had some vomiting with the pregnancy but has been worse for the past 2 days. Has blood in the vomit. Denies abdominal pain or bleeding.

## 2013-02-20 NOTE — MAU Provider Note (Signed)
History     CSN: [id number]  Arrival date and time: 02/20/13 1708   None     Chief Complaint  Patient presents with  . Emesis During Pregnancy   HPI  Jody Perez is a 22 y.o. G2P0010 at [redacted]w[redacted]d who presents today with nausea and vomiting. She states that she was given a RX in the clinic, but she has not been able to fill the rx 2/2 medicaid pending, and she cannot afford the medications. She is concerned that she is dehydrated. She states she has thrown up 2x today. Today has been the best day for her in a while. She states that it is usually much worse. She denies any abdominal pain or vaginal bleeding.   Past Medical History  Diagnosis Date  . Chlamydia   . BV (bacterial vaginosis)   . Trichomonas   . UTI (lower urinary tract infection)   . HSV infection   . Herpes genitalis in women   . Miscarriage   . Medical history non-contributory     Past Surgical History  Procedure Laterality Date  . No past surgeries      Family History  Problem Relation Age of Onset  . Hypertension Mother   . Hypertension Brother     History  Substance Use Topics  . Smoking status: Never Smoker   . Smokeless tobacco: Never Used  . Alcohol Use: No    Allergies: No Known Allergies  Prescriptions prior to admission  Medication Sig Dispense Refill  . Prenatal Vit-Fe Fumarate-FA (PRENATAL MULTIVITAMIN) TABS Take 1 tablet by mouth daily at 12 noon.      . [EXPIRED] metroNIDAZOLE (FLAGYL) 500 MG tablet Take 500 mg by mouth 3 (three) times daily.        Review of Systems  Constitutional: Negative for fever and chills.  Eyes: Negative for blurred vision.  Gastrointestinal: Positive for nausea and vomiting. Negative for abdominal pain, diarrhea and constipation.  Genitourinary: Negative for dysuria, urgency and frequency.  Musculoskeletal: Negative for myalgias.  Neurological: Positive for headaches (she believes she may have migraines as she has frequent headaches prior to pregnancy. ).  Negative for dizziness.   Physical Exam   Blood pressure 117/72, pulse 81, temperature 98.8 F (37.1 C), temperature source Oral, resp. rate 16, height 5\' 3"  (1.6 m), weight 70.852 kg (156 lb 3.2 oz), last menstrual period 10/01/2012, SpO2 100.00%.  Physical Exam  Nursing note and vitals reviewed. Constitutional: She is oriented to person, place, and time. She appears well-developed and well-nourished. No distress.  Cardiovascular: Normal rate.   Respiratory: Effort normal.  GI: Soft. She exhibits no distension. There is no tenderness.  Neurological: She is alert and oriented to person, place, and time.  Skin: Skin is warm and dry.  Psychiatric: She has a normal mood and affect.    MAU Course  Procedures  Results for orders placed during the hospital encounter of 02/20/13 (from the past 24 hour(s))  URINALYSIS, ROUTINE W REFLEX MICROSCOPIC     Status: Abnormal   Collection Time    02/20/13  5:50 PM      Result Value Range   Color, Urine YELLOW  YELLOW   APPearance HAZY (*) CLEAR   Specific Gravity, Urine 1.015  1.005 - 1.030   pH 6.5  5.0 - 8.0   Glucose, UA NEGATIVE  NEGATIVE mg/dL   Hgb urine dipstick NEGATIVE  NEGATIVE   Bilirubin Urine NEGATIVE  NEGATIVE   Ketones, ur NEGATIVE  NEGATIVE mg/dL  Protein, ur NEGATIVE  NEGATIVE mg/dL   Urobilinogen, UA 0.2  0.0 - 1.0 mg/dL   Nitrite NEGATIVE  NEGATIVE   Leukocytes, UA TRACE (*) NEGATIVE  URINE MICROSCOPIC-ADD ON     Status: Abnormal   Collection Time    02/20/13  5:50 PM      Result Value Range   Squamous Epithelial / LPF MANY (*) RARE   WBC, UA 3-6  <3 WBC/hpf   Bacteria, UA RARE  RARE   Urine-Other MUCOUS PRESENT    CBC     Status: Abnormal   Collection Time    02/20/13  8:05 PM      Result Value Range   WBC 11.8 (*) 4.0 - 10.5 K/uL   RBC 3.92  3.87 - 5.11 MIL/uL   Hemoglobin 12.0  12.0 - 15.0 g/dL   HCT 16.1 (*) 09.6 - 04.5 %   MCV 89.3  78.0 - 100.0 fL   MCH 30.6  26.0 - 34.0 pg   MCHC 34.3  30.0 - 36.0  g/dL   RDW 40.9  81.1 - 91.4 %   Platelets 295  150 - 400 K/uL  COMPREHENSIVE METABOLIC PANEL     Status: Abnormal   Collection Time    02/20/13  8:05 PM      Result Value Range   Sodium 133 (*) 135 - 145 mEq/L   Potassium 3.1 (*) 3.5 - 5.1 mEq/L   Chloride 98  96 - 112 mEq/L   CO2 23  19 - 32 mEq/L   Glucose, Bld 71  70 - 99 mg/dL   BUN 4 (*) 6 - 23 mg/dL   Creatinine, Ser 7.82  0.50 - 1.10 mg/dL   Calcium 8.9  8.4 - 95.6 mg/dL   Total Protein 7.5  6.0 - 8.3 g/dL   Albumin 3.1 (*) 3.5 - 5.2 g/dL   AST 14  0 - 37 U/L   ALT 10  0 - 35 U/L   Alkaline Phosphatase 63  39 - 117 U/L   Total Bilirubin 0.2 (*) 0.3 - 1.2 mg/dL   GFR calc non Af Amer >90  >90 mL/min   GFR calc Af Amer >90  >90 mL/min    2126: Pt is able to keep down crackers and apple juice.   Assessment and Plan   1. Nausea and vomiting in pregnancy prior to [redacted] weeks gestation    RX phenergan Pepcid AC Discussed comfort measures for NVP and self limited nature.   Tawnya Crook 02/20/2013, 8:36 PM

## 2013-02-21 NOTE — MAU Provider Note (Signed)
Attestation of Attending Supervision of Advanced Practitioner (CNM/NP): Evaluation and management procedures were performed by the Advanced Practitioner under my supervision and collaboration.  I have reviewed the Advanced Practitioner's note and chart, and I agree with the management and plan.  Jody Perez 02/21/2013 7:43 AM

## 2013-02-25 ENCOUNTER — Other Ambulatory Visit: Payer: Self-pay | Admitting: Obstetrics & Gynecology

## 2013-02-25 ENCOUNTER — Ambulatory Visit (INDEPENDENT_AMBULATORY_CARE_PROVIDER_SITE_OTHER): Payer: Medicaid Other | Admitting: Obstetrics & Gynecology

## 2013-02-25 VITALS — BP 129/79 | Wt 154.7 lb

## 2013-02-25 DIAGNOSIS — E876 Hypokalemia: Secondary | ICD-10-CM

## 2013-02-25 DIAGNOSIS — O98519 Other viral diseases complicating pregnancy, unspecified trimester: Secondary | ICD-10-CM

## 2013-02-25 DIAGNOSIS — Z3480 Encounter for supervision of other normal pregnancy, unspecified trimester: Secondary | ICD-10-CM

## 2013-02-25 DIAGNOSIS — Z3492 Encounter for supervision of normal pregnancy, unspecified, second trimester: Secondary | ICD-10-CM

## 2013-02-25 LAB — POCT URINALYSIS DIP (DEVICE)
Glucose, UA: NEGATIVE mg/dL
Nitrite: NEGATIVE
Specific Gravity, Urine: 1.02 (ref 1.005–1.030)
Urobilinogen, UA: 0.2 mg/dL (ref 0.0–1.0)

## 2013-02-25 NOTE — Patient Instructions (Addendum)
Pregnancy - Second Trimester The second trimester of pregnancy (3 to 6 months) is a period of rapid growth for you and your baby. At the end of the sixth month, your baby is about 9 inches long and weighs 1 1/2 pounds. You will begin to feel the baby move between 18 and 20 weeks of the pregnancy. This is called quickening. Weight gain is faster. A clear fluid (colostrum) may leak out of your breasts. You may feel small contractions of the womb (uterus). This is known as false labor or Braxton-Hicks contractions. This is like a practice for labor when the baby is ready to be born. Usually, the problems with morning sickness have usually passed by the end of your first trimester. Some women develop small dark blotches (called cholasma, mask of pregnancy) on their face that usually goes away after the baby is born. Exposure to the sun makes the blotches worse. Acne may also develop in some pregnant women and pregnant women who have acne, may find that it goes away. PRENATAL EXAMS  Blood work may continue to be done during prenatal exams. These tests are done to check on your health and the probable health of your baby. Blood work is used to follow your blood levels (hemoglobin). Anemia (low hemoglobin) is common during pregnancy. Iron and vitamins are given to help prevent this. You will also be checked for diabetes between 24 and 28 weeks of the pregnancy. Some of the previous blood tests may be repeated.  The size of the uterus is measured during each visit. This is to make sure that the baby is continuing to grow properly according to the dates of the pregnancy.  Your blood pressure is checked every prenatal visit. This is to make sure you are not getting toxemia.  Your urine is checked to make sure you do not have an infection, diabetes or protein in the urine.  Your weight is checked often to make sure gains are happening at the suggested rate. This is to ensure that both you and your baby are growing  normally.  Sometimes, an ultrasound is performed to confirm the proper growth and development of the baby. This is a test which bounces harmless sound waves off the baby so your caregiver can more accurately determine due dates. Sometimes, a specialized test is done on the amniotic fluid surrounding the baby. This test is called an amniocentesis. The amniotic fluid is obtained by sticking a needle into the belly (abdomen). This is done to check the chromosomes in instances where there is a concern about possible genetic problems with the baby. It is also sometimes done near the end of pregnancy if an early delivery is required. In this case, it is done to help make sure the baby's lungs are mature enough for the baby to live outside of the womb. CHANGES OCCURING IN THE SECOND TRIMESTER OF PREGNANCY Your body goes through many changes during pregnancy. They vary from person to person. Talk to your caregiver about changes you notice that you are concerned about.  During the second trimester, you will likely have an increase in your appetite. It is normal to have cravings for certain foods. This varies from person to person and pregnancy to pregnancy.  Your lower abdomen will begin to bulge.  You may have to urinate more often because the uterus and baby are pressing on your bladder. It is also common to get more bladder infections during pregnancy (pain with urination). You can help this by   drinking lots of fluids and emptying your bladder before and after intercourse.  You may begin to get stretch marks on your hips, abdomen, and breasts. These are normal changes in the body during pregnancy. There are no exercises or medications to take that prevent this change.  You may begin to develop swollen and bulging veins (varicose veins) in your legs. Wearing support hose, elevating your feet for 15 minutes, 3 to 4 times a day and limiting salt in your diet helps lessen the problem.  Heartburn may develop  as the uterus grows and pushes up against the stomach. Antacids recommended by your caregiver helps with this problem. Also, eating smaller meals 4 to 5 times a day helps.  Constipation can be treated with a stool softener or adding bulk to your diet. Drinking lots of fluids, vegetables, fruits, and whole grains are helpful.  Exercising is also helpful. If you have been very active up until your pregnancy, most of these activities can be continued during your pregnancy. If you have been less active, it is helpful to start an exercise program such as walking.  Hemorrhoids (varicose veins in the rectum) may develop at the end of the second trimester. Warm sitz baths and hemorrhoid cream recommended by your caregiver helps hemorrhoid problems.  Backaches may develop during this time of your pregnancy. Avoid heavy lifting, wear low heal shoes and practice good posture to help with backache problems.  Some pregnant women develop tingling and numbness of their hand and fingers because of swelling and tightening of ligaments in the wrist (carpel tunnel syndrome). This goes away after the baby is born.  As your breasts enlarge, you may have to get a bigger bra. Get a comfortable, cotton, support bra. Do not get a nursing bra until the last month of the pregnancy if you will be nursing the baby.  You may get a dark line from your belly button to the pubic area called the linea nigra.  You may develop rosy cheeks because of increase blood flow to the face.  You may develop spider looking lines of the face, neck, arms and chest. These go away after the baby is born. HOME CARE INSTRUCTIONS   It is extremely important to avoid all smoking, herbs, alcohol, and unprescribed drugs during your pregnancy. These chemicals affect the formation and growth of the baby. Avoid these chemicals throughout the pregnancy to ensure the delivery of a healthy infant.  Most of your home care instructions are the same as  suggested for the first trimester of your pregnancy. Keep your caregiver's appointments. Follow your caregiver's instructions regarding medication use, exercise and diet.  During pregnancy, you are providing food for you and your baby. Continue to eat regular, well-balanced meals. Choose foods such as meat, fish, milk and other low fat dairy products, vegetables, fruits, and whole-grain breads and cereals. Your caregiver will tell you of the ideal weight gain.  A physical sexual relationship may be continued up until near the end of pregnancy if there are no other problems. Problems could include early (premature) leaking of amniotic fluid from the membranes, vaginal bleeding, abdominal pain, or other medical or pregnancy problems.  Exercise regularly if there are no restrictions. Check with your caregiver if you are unsure of the safety of some of your exercises. The greatest weight gain will occur in the last 2 trimesters of pregnancy. Exercise will help you:  Control your weight.  Get you in shape for labor and delivery.  Lose weight   after you have the baby.  Wear a good support or jogging bra for breast tenderness during pregnancy. This may help if worn during sleep. Pads or tissues may be used in the bra if you are leaking colostrum.  Do not use hot tubs, steam rooms or saunas throughout the pregnancy.  Wear your seat belt at all times when driving. This protects you and your baby if you are in an accident.  Avoid raw meat, uncooked cheese, cat litter boxes and soil used by cats. These carry germs that can cause birth defects in the baby.  The second trimester is also a good time to visit your dentist for your dental health if this has not been done yet. Getting your teeth cleaned is OK. Use a soft toothbrush. Brush gently during pregnancy.  It is easier to loose urine during pregnancy. Tightening up and strengthening the pelvic muscles will help with this problem. Practice stopping your  urination while you are going to the bathroom. These are the same muscles you need to strengthen. It is also the muscles you would use as if you were trying to stop from passing gas. You can practice tightening these muscles up 10 times a set and repeating this about 3 times per day. Once you know what muscles to tighten up, do not perform these exercises during urination. It is more likely to contribute to an infection by backing up the urine.  Ask for help if you have financial, counseling or nutritional needs during pregnancy. Your caregiver will be able to offer counseling for these needs as well as refer you for other special needs.  Your skin may become oily. If so, wash your face with mild soap, use non-greasy moisturizer and oil or cream based makeup. MEDICATIONS AND DRUG USE IN PREGNANCY  Take prenatal vitamins as directed. The vitamin should contain 1 milligram of folic acid. Keep all vitamins out of reach of children. Only a couple vitamins or tablets containing iron may be fatal to a baby or young child when ingested.  Avoid use of all medications, including herbs, over-the-counter medications, not prescribed or suggested by your caregiver. Only take over-the-counter or prescription medicines for pain, discomfort, or fever as directed by your caregiver. Do not use aspirin.  Let your caregiver also know about herbs you may be using.  Alcohol is related to a number of birth defects. This includes fetal alcohol syndrome. All alcohol, in any form, should be avoided completely. Smoking will cause low birth rate and premature babies.  Street or illegal drugs are very harmful to the baby. They are absolutely forbidden. A baby born to an addicted mother will be addicted at birth. The baby will go through the same withdrawal an adult does. SEEK MEDICAL CARE IF:  You have any concerns or worries during your pregnancy. It is better to call with your questions if you feel they cannot wait, rather  than worry about them. SEEK IMMEDIATE MEDICAL CARE IF:   An unexplained oral temperature above 102 F (38.9 C) develops, or as your caregiver suggests.  You have leaking of fluid from the vagina (birth canal). If leaking membranes are suspected, take your temperature and tell your caregiver of this when you call.  There is vaginal spotting, bleeding, or passing clots. Tell your caregiver of the amount and how many pads are used. Light spotting in pregnancy is common, especially following intercourse.  You develop a bad smelling vaginal discharge with a change in the color from clear   to white.  You continue to feel sick to your stomach (nauseated) and have no relief from remedies suggested. You vomit blood or coffee ground-like materials.  You lose more than 2 pounds of weight or gain more than 2 pounds of weight over 1 week, or as suggested by your caregiver.  You notice swelling of your face, hands, feet, or legs.  You get exposed to German measles and have never had them.  You are exposed to fifth disease or chickenpox.  You develop belly (abdominal) pain. Round ligament discomfort is a common non-cancerous (benign) cause of abdominal pain in pregnancy. Your caregiver still must evaluate you.  You develop a bad headache that does not go away.  You develop fever, diarrhea, pain with urination, or shortness of breath.  You develop visual problems, blurry, or double vision.  You fall or are in a car accident or any kind of trauma.  There is mental or physical violence at home. Document Released: 11/07/2001 Document Revised: 02/05/2012 Document Reviewed: 05/12/2009 [employer] Patient Information 2013 [employer].  

## 2013-02-25 NOTE — Progress Notes (Signed)
Pt with no complaints For f/u in 4weeks Quad screen next visit sono at 19-20 weeks

## 2013-02-25 NOTE — Progress Notes (Signed)
P - 74 

## 2013-03-18 ENCOUNTER — Encounter (HOSPITAL_COMMUNITY): Payer: Self-pay | Admitting: *Deleted

## 2013-03-18 ENCOUNTER — Inpatient Hospital Stay (HOSPITAL_COMMUNITY)
Admission: AD | Admit: 2013-03-18 | Discharge: 2013-03-18 | Disposition: A | Discharge status: Home / Self Care | Payer: Medicaid Other | Source: Ambulatory Visit | Attending: Obstetrics & Gynecology | Admitting: Obstetrics & Gynecology

## 2013-03-18 DIAGNOSIS — O219 Vomiting of pregnancy, unspecified: Secondary | ICD-10-CM

## 2013-03-18 DIAGNOSIS — O210 Mild hyperemesis gravidarum: Secondary | ICD-10-CM | POA: Insufficient documentation

## 2013-03-18 DIAGNOSIS — G43009 Migraine without aura, not intractable, without status migrainosus: Secondary | ICD-10-CM

## 2013-03-18 DIAGNOSIS — R51 Headache: Secondary | ICD-10-CM | POA: Insufficient documentation

## 2013-03-18 DIAGNOSIS — O99891 Other specified diseases and conditions complicating pregnancy: Secondary | ICD-10-CM | POA: Insufficient documentation

## 2013-03-18 LAB — URINALYSIS, ROUTINE W REFLEX MICROSCOPIC
Glucose, UA: NEGATIVE mg/dL
Ketones, ur: 15 mg/dL — AB
Leukocytes, UA: NEGATIVE
Nitrite: NEGATIVE
Protein, ur: 30 mg/dL — AB
Urobilinogen, UA: 1 mg/dL (ref 0.0–1.0)

## 2013-03-18 LAB — URINE MICROSCOPIC-ADD ON

## 2013-03-18 MED ORDER — ONDANSETRON 8 MG PO TBDP
8.0000 mg | ORAL_TABLET | Freq: Once | ORAL | Status: AC
  Administered 2013-03-18: 8 mg via ORAL
  Filled 2013-03-18: qty 1

## 2013-03-18 MED ORDER — ONDANSETRON HCL 4 MG PO TABS: 4.0000 mg | ORAL_TABLET | ORAL | Status: DC | PRN

## 2013-03-18 MED ORDER — BUTALBITAL-APAP-CAFFEINE 50-325-40 MG PO TABS: 1.0000 | ORAL_TABLET | Freq: Four times a day (QID) | ORAL | Status: DC | PRN

## 2013-03-18 MED ORDER — BUTALBITAL-APAP-CAFFEINE 50-325-40 MG PO TABS
2.0000 | ORAL_TABLET | Freq: Once | ORAL | Status: AC
  Administered 2013-03-18: 2 via ORAL
  Filled 2013-03-18: qty 2

## 2013-03-18 NOTE — MAU Note (Signed)
Patient states she has been having headaches 3-4 times a week since the beginning of the pregnancy. For the past two days had had nausea and vomiting with the headache. Denies any pregnancy problems.

## 2013-03-18 NOTE — MAU Provider Note (Signed)
Chief Complaint: Headache and Vomiting   None    SUBJECTIVE HPI: Jody Perez is a 22 y.o. G2P0010 at [redacted]w[redacted]d by LMP who presents with HA x two days and N/V. Has Hx Migraines especially w/ pregnancy.   Past Medical History  Diagnosis Date  . Chlamydia   . BV (bacterial vaginosis)   . Trichomonas   . UTI (lower urinary tract infection)   . HSV infection   . Herpes genitalis in women   . Miscarriage    OB History   Grav Para Term Preterm Abortions TAB SAB Ect Mult Living   2    1  1    0     # Outc Date GA Lbr Len/2nd Wgt Sex Del Anes PTL Lv   1 SAB 2013           Comments: System Generated. Please review and update pregnancy details.   2 CUR              Past Surgical History  Procedure Laterality Date  . No past surgeries     History   Social History  . Marital Status: Single    Spouse Name: N/A    Number of Children: N/A  . Years of Education: N/A   Occupational History  . Not on file.   Social History Main Topics  . Smoking status: Never Smoker   . Smokeless tobacco: Never Used  . Alcohol Use: No  . Drug Use: No  . Sexually Active: Yes    Birth Control/ Protection: None   Other Topics Concern  . Not on file   Social History Narrative  . No narrative on file   No current facility-administered medications on file prior to encounter.   Current Outpatient Prescriptions on File Prior to Encounter  Medication Sig Dispense Refill  . Prenatal Vit-Fe Fumarate-FA (PRENATAL MULTIVITAMIN) TABS Take 1 tablet by mouth daily at 12 noon.      . promethazine (PHENERGAN) 12.5 MG tablet Take 1 tablet (12.5 mg total) by mouth every 6 (six) hours as needed for nausea.  30 tablet  0   No Known Allergies  ROS: Neg for fever, chills, aura, neck stiffness, weakness, difficulty w/ speech or gait, URI Sx, allergies.   OBJECTIVE Blood pressure 112/68, pulse 77, temperature 99 F (37.2 C), temperature source Oral, height 5\' 3"  (1.6 m), weight 70.67 kg (155 lb 12.8 oz), last  menstrual period 10/01/2012, SpO2 97.00%. GENERAL: Well-developed, well-nourished female in no acute distress.  HEENT: Normocephalic, sinuses NT, mucus membranes moist.  HEART: normal rate RESP: normal effort NEURO: Alert and oriented SPECULUM EXAM: Deferred.  FHR 150 by doppler.   LAB RESULTS Results for orders placed during the hospital encounter of 03/18/13 (from the past 24 hour(s))  URINALYSIS, ROUTINE W REFLEX MICROSCOPIC     Status: Abnormal   Collection Time    03/18/13  5:10 PM      Result Value Range   Color, Urine YELLOW  YELLOW   APPearance CLOUDY (*) CLEAR   Specific Gravity, Urine 1.020  1.005 - 1.030   pH 8.0  5.0 - 8.0   Glucose, UA NEGATIVE  NEGATIVE mg/dL   Hgb urine dipstick NEGATIVE  NEGATIVE   Bilirubin Urine NEGATIVE  NEGATIVE   Ketones, ur 15 (*) NEGATIVE mg/dL   Protein, ur 30 (*) NEGATIVE mg/dL   Urobilinogen, UA 1.0  0.0 - 1.0 mg/dL   Nitrite NEGATIVE  NEGATIVE   Leukocytes, UA NEGATIVE  NEGATIVE  URINE MICROSCOPIC-ADD  ON     Status: Abnormal   Collection Time    03/18/13  5:10 PM      Result Value Range   Squamous Epithelial / LPF FEW (*) RARE   WBC, UA 0-2  <3 WBC/hpf   Urine-Other AMORPHOUS URATES/PHOSPHATES      IMAGING No results found.  MAU COURSE Sx resolved w/ Zofran and Fioricet.   ASSESSMENT 1. Migraine without aura   2. Nausea and vomiting in pregnancy prior to [redacted] weeks gestation     PLAN Discharge home Follow-up Information   Follow up with [facility]. (AS SCHEDULED)    Contact information:   [address] [place] [address] [phone]      Follow up with [facility] [place] [facility]ERN[facility] ADMISS[facility]. (As needed if symptoms worsen)    Contact information:   [address] [place] [address] [phone]       Medication List    TAKE these medications       acetaminophen 500 MG tablet  Commonly known as:  TYLENOL  Take 500 mg by mouth every 6 (six) hours as needed for  pain.     butalbital-acetaminophen-caffeine 50-325-40 MG per tablet  Commonly known as:  FIORICET  Take 1-2 tablets by mouth every 6 (six) hours as needed for headache.     ondansetron 4 MG tablet  Commonly known as:  ZOFRAN  Take 1 tablet (4 mg total) by mouth every 4 (four) hours as needed for nausea.     prenatal multivitamin Tabs  Take 1 tablet by mouth daily at 12 noon.     promethazine 12.5 MG tablet  Commonly known as:  PHENERGAN  Take 1 tablet (12.5 mg total) by mouth every 6 (six) hours as needed for nausea.       [place], [place] 03/18/2013  6:58 PM

## 2013-03-20 NOTE — MAU Provider Note (Signed)
Attestation of Attending Supervision of Advanced Practitioner (PA/CNM/NP): Evaluation and management procedures were performed by the Advanced Practitioner under my supervision and collaboration.  I have reviewed the Advanced Practitioner's note and chart, and I agree with the management and plan.  Zuri Bradway, MD, FACOG Attending Obstetrician & Gynecologist Faculty Practice, [facility]  

## 2013-03-25 ENCOUNTER — Ambulatory Visit (INDEPENDENT_AMBULATORY_CARE_PROVIDER_SITE_OTHER): Payer: Medicaid Other | Admitting: Obstetrics & Gynecology

## 2013-03-25 ENCOUNTER — Encounter: Payer: Self-pay | Admitting: Obstetrics & Gynecology

## 2013-03-25 ENCOUNTER — Ambulatory Visit (HOSPITAL_COMMUNITY)
Admission: RE | Admit: 2013-03-25 | Discharge: 2013-03-25 | Disposition: A | Discharge status: Home / Self Care | Payer: Medicaid Other | Source: Ambulatory Visit | Attending: Obstetrics & Gynecology | Admitting: Obstetrics & Gynecology

## 2013-03-25 ENCOUNTER — Other Ambulatory Visit: Payer: Self-pay | Admitting: Obstetrics & Gynecology

## 2013-03-25 VITALS — BP 119/83 | Wt 157.5 lb

## 2013-03-25 DIAGNOSIS — Z1389 Encounter for screening for other disorder: Secondary | ICD-10-CM | POA: Insufficient documentation

## 2013-03-25 DIAGNOSIS — O358XX0 Maternal care for other (suspected) fetal abnormality and damage, not applicable or unspecified: Secondary | ICD-10-CM | POA: Insufficient documentation

## 2013-03-25 DIAGNOSIS — Z3482 Encounter for supervision of other normal pregnancy, second trimester: Secondary | ICD-10-CM

## 2013-03-25 DIAGNOSIS — Z363 Encounter for antenatal screening for malformations: Secondary | ICD-10-CM | POA: Insufficient documentation

## 2013-03-25 DIAGNOSIS — Z3490 Encounter for supervision of normal pregnancy, unspecified, unspecified trimester: Secondary | ICD-10-CM

## 2013-03-25 DIAGNOSIS — O26839 Pregnancy related renal disease, unspecified trimester: Secondary | ICD-10-CM

## 2013-03-25 DIAGNOSIS — O1212 Gestational proteinuria, second trimester: Secondary | ICD-10-CM

## 2013-03-25 DIAGNOSIS — O1210 Gestational proteinuria, unspecified trimester: Secondary | ICD-10-CM | POA: Insufficient documentation

## 2013-03-25 LAB — POCT URINALYSIS DIP (DEVICE)
Glucose, UA: NEGATIVE mg/dL
Specific Gravity, Urine: 1.025 (ref 1.005–1.030)
Urobilinogen, UA: 1 mg/dL (ref 0.0–1.0)

## 2013-03-25 LAB — COMPREHENSIVE METABOLIC PANEL
ALT: 15 U/L (ref 0–35)
Albumin: 3.6 g/dL (ref 3.5–5.2)
Alkaline Phosphatase: 64 U/L (ref 39–117)
CO2: 24 mEq/L (ref 19–32)
Glucose, Bld: 77 mg/dL (ref 70–99)
Potassium: 4 mEq/L (ref 3.5–5.3)
Sodium: 133 mEq/L — ABNORMAL LOW (ref 135–145)
Total Protein: 6.6 g/dL (ref 6.0–8.3)

## 2013-03-25 IMAGING — US US OB DETAIL+14 WK
1 series · 12 of 28 positions shown · non-contrast
Comparison: none

[Series 1: us ob detail +14 wk · 96 acquisitions, 12 frames shown]
[im 4/96]
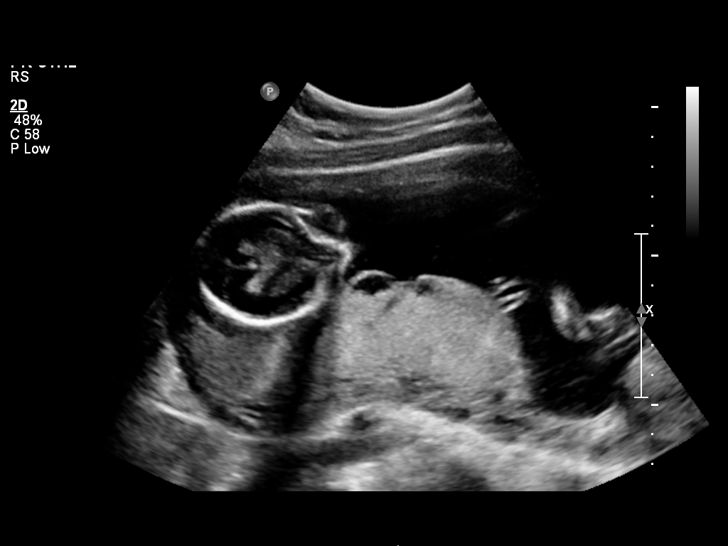
[im 11/96]
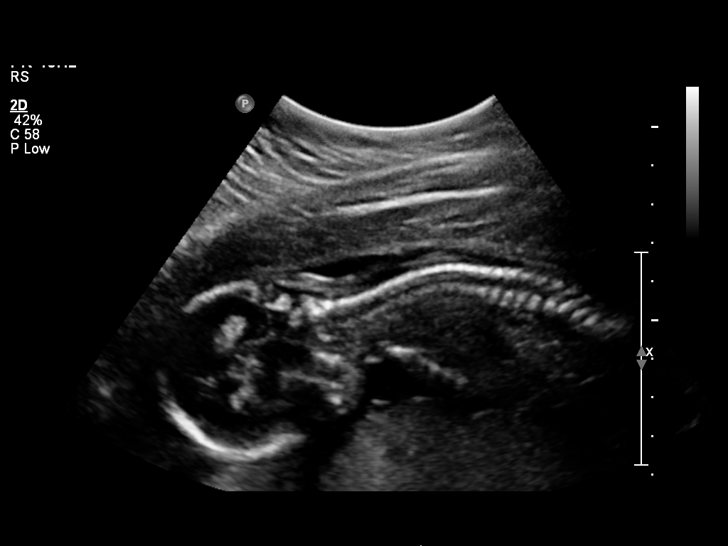
[im 18/96]
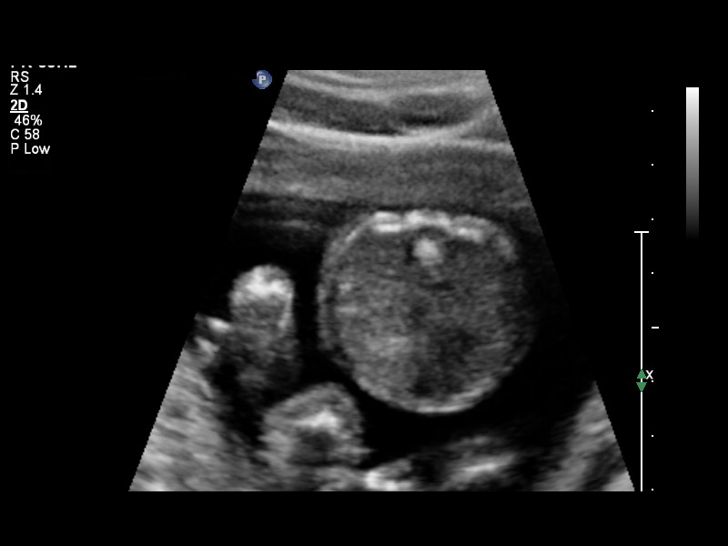
[im 29/96]
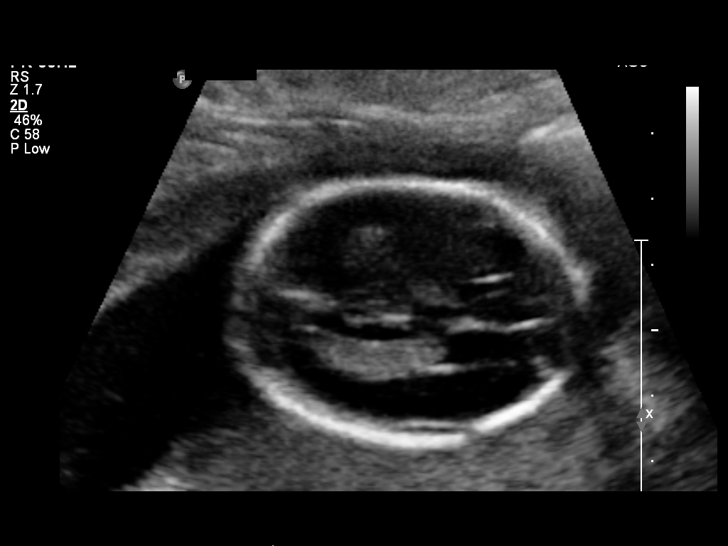
[im 36/96]
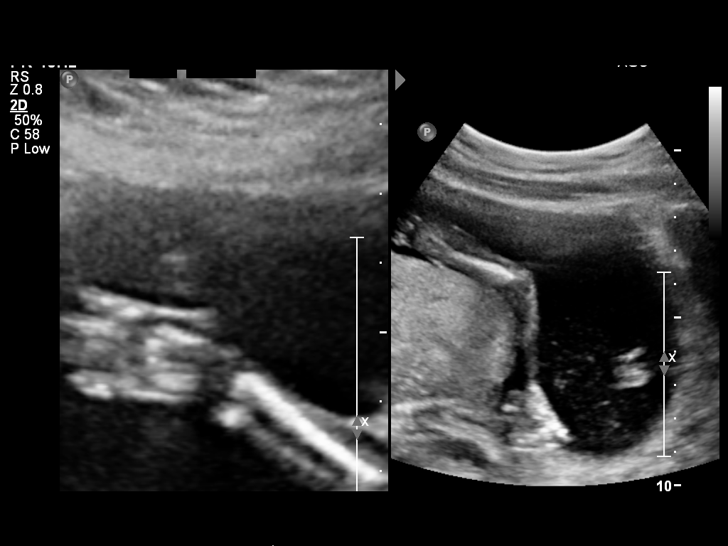
[im 43/96]
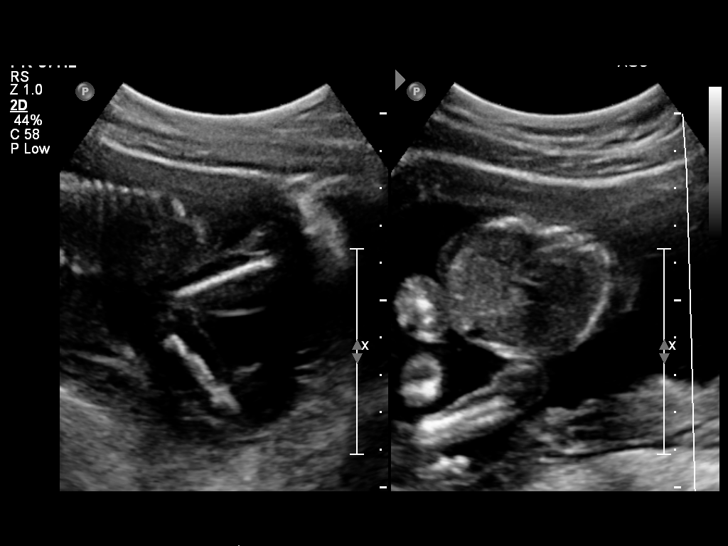
[im 53/96]
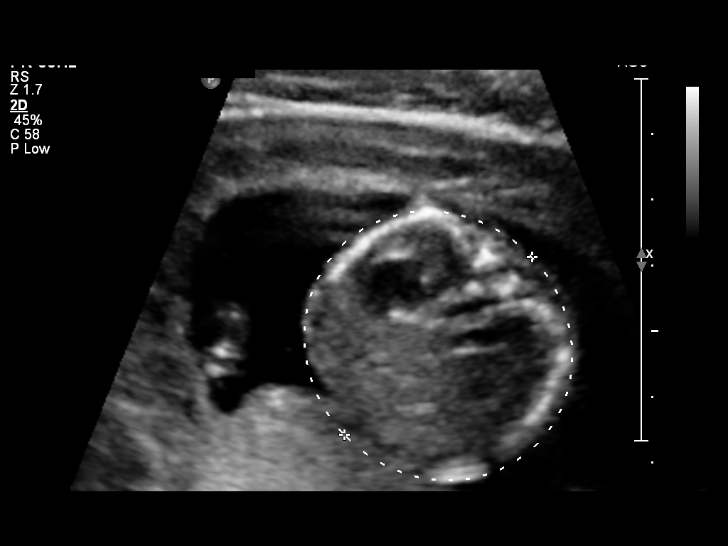
[im 60/96]
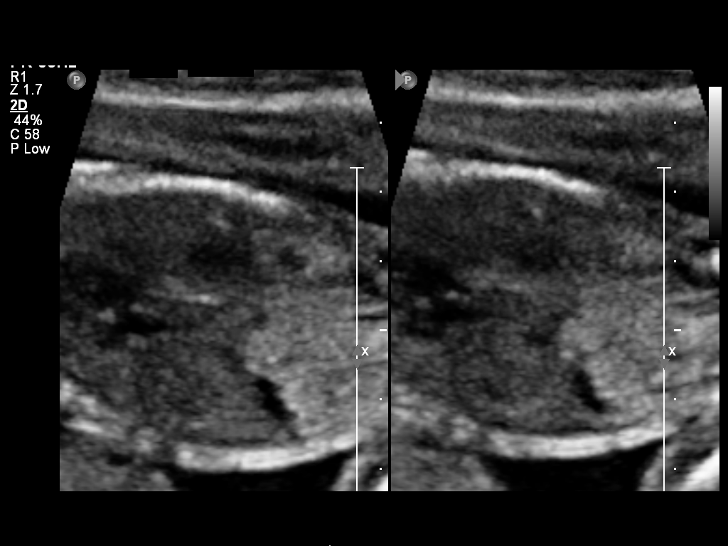
[im 67/96]
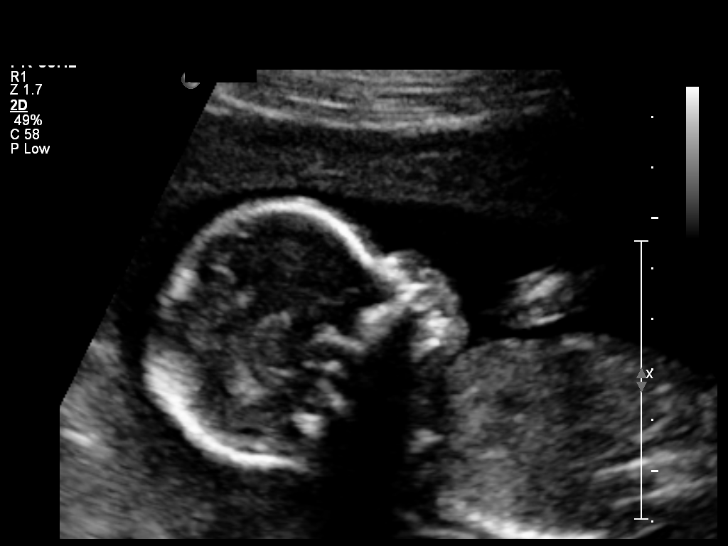
[im 78/96]
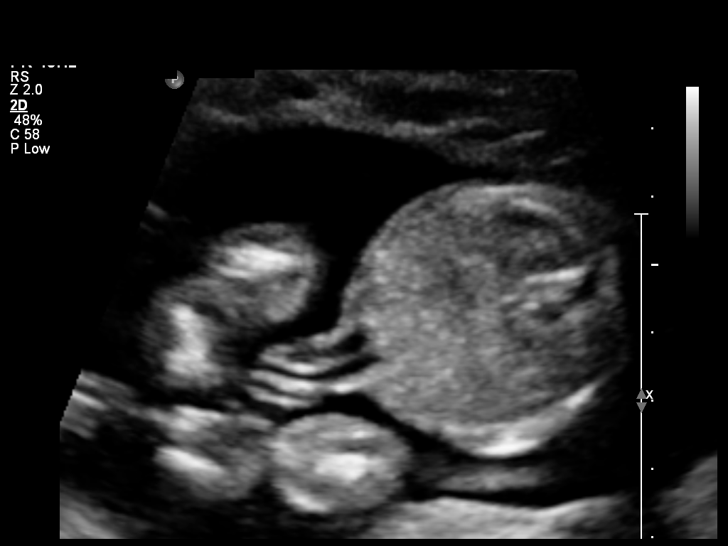
[im 85/96]
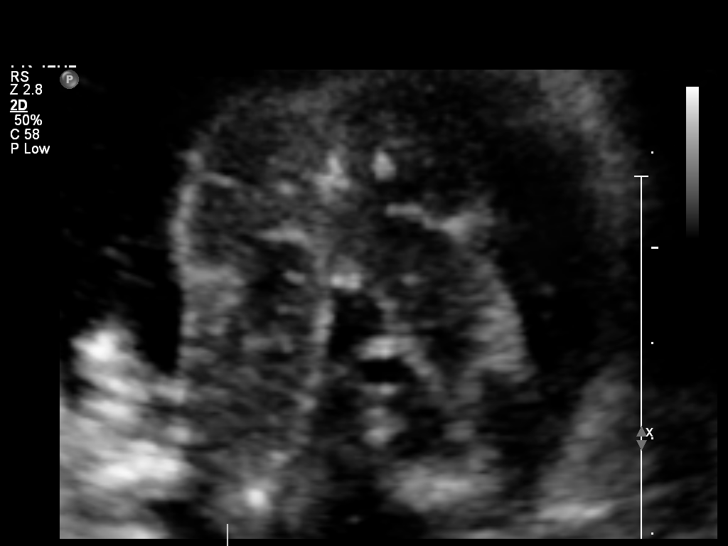
[im 92/96]
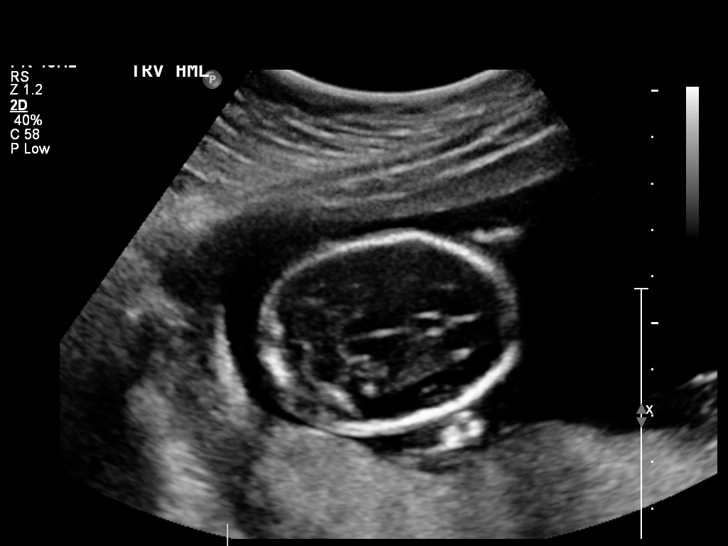

[12 of 28 positions shown; findings below may reference images not displayed]

OBSTETRICS REPORT
                      (Signed Final [DATE] [DATE])

Service(s) Provided

 US OB DETAIL + 14 WK                                  76811.0
Indications

 Detailed fetal anatomic survey                        655.83 [77]
Fetal Evaluation

 Num Of Fetuses:    1
 Fetal Heart Rate:  133                         bpm
 Cardiac Activity:  Observed
 Presentation:      Breech, footling
 Placenta:          Anterior, above cervical os
 P. Cord            Visualized, central
 Insertion:

 Amniotic Fluid
 AFI FV:      Subjectively within normal limits
                                             Larg Pckt:   3.75   cm
Biometry

 BPD:       39  mm    G. Age:   17w 6d                CI:        63.54   70 - 86
                                                      FL/HC:      17.8   16.1 -

 HC:     157.6  mm    G. Age:   18w 5d       47  %    HC/AC:      1.25   1.09 -

 AC:     126.2  mm    G. Age:   18w 2d       35  %    FL/BPD:
 FL:        28  mm    G. Age:   18w 4d       44  %    FL/AC:      22.2   20 - 24
 HUM:       27  mm    G. Age:   18w 4d       53  %
 CER:     18.2  mm    G. Age:   18w 1d       34  %
 NFT:     3.72  mm

 Est. FW:     238  gm      0 lb 8 oz     42  %
Gestational Age

 LMP:           25w 0d       Date:   [DATE]                 EDD:   [DATE]
 U/S Today:     18w 3d                                        EDD:   [DATE]
 Best:          18w 4d    Det. By:   Early Ultrasound         EDD:   [DATE]
2nd Trimester Genetic Sonogram - Trisomy 21 Screening

 Age:                                             22          Risk=1:   885
 Structural anomalies (inc. cardiac):             No
 Echogenic bowel:                                 No
 Hypoplastic / absent midphalanx 5th Digit:       No
 Pyelectasis:                                     No
 2-vessel umbilical cord:                         No
 Echogenic cardiac foci:                          No

 RVOT not well visualized. No visualized soft markers for
 aneuploidy.
Anatomy

 Cranium:          Appears normal         Aortic Arch:      Appears normal
 Fetal Cavum:      Appears normal         Ductal Arch:      Appears normal
 Ventricles:       Appears normal         Diaphragm:        Appears normal
 Choroid Plexus:   Appears normal         Stomach:          Appears normal
 Cerebellum:       Appears normal         Abdomen:          Appears normal
 Posterior Fossa:  Appears normal         Abdominal Wall:   Appears nml (cord
                                                            insert, abd wall)
 Nuchal Fold:      Appears normal         Cord Vessels:     Appears normal (3
                                                            vessel cord)
 Face:             Appears normal         Kidneys:          Appear normal
                   (orbits and profile)
 Lips:             Appears normal         Bladder:          Appears normal
 Heart:            Appears normal         Spine:            Appears normal
                   (4CH, axis, and
                   situs)
 RVOT:             Not well visualized    Lower             Appears normal
                                          Extremities:
 LVOT:             Appears normal         Upper             Appears normal
                                          Extremities:

 Other:  Fetus appears to be a female. Heels and 5th digit visualized. Nasal
         bone visualized. Technically difficult due to fetal position.
Targeted Anatomy

 Fetal Central Nervous System
 Lat. Ventricles:  6.3                    Cisterna Magna:
Cervix Uterus Adnexa

 Cervical Length:   3.1       cm

 Cervix:       Normal appearance by transabdominal scan.
 Uterus:       No abnormality visualized.
 Cul De Sac:   No free fluid seen.

 Left Ovary:   Within normal limits.
 Right Ovary:  Within normal limits.
 Adnexa:     No abnormality visualized.
Impression

 Single live IUP in breech presentation.   Concordant
 measurements/assigned GA by US.
 No anatomic abnormality seen with a good quality survey
 possible. RVOT not well visualized due to fetal position.

 questions or concerns.

## 2013-03-25 NOTE — Addendum Note (Signed)
Addended by: Franchot Mimes on: 03/25/2013 12:08 PM   Modules accepted: Orders

## 2013-03-25 NOTE — Progress Notes (Signed)
Pt with no complaints. Has 2+ protein in her urine today.  Last week was 1+- will obtain 24 hour urine for protein.  Cr- is 0.7  Quad screen today Schedule anantomy scan

## 2013-03-25 NOTE — Progress Notes (Signed)
Pulse- 101 

## 2013-03-25 NOTE — Patient Instructions (Signed)
AFP Maternal This is a routine screen (tests) used to check for fetal abnormalities such as Down syndrome and neural tube defects. Down Syndrome is a chromosomal abnormality, sometimes called Trisomy 77. Neural tube defects are serious birth defects. The brain, spinal cord, or their coverings do not develop completely. Women should be tested in the 15th to 20th week of pregnancy. The msAFP screen involves three or four tests that measure substances found in the blood that make the testing better. During development, AFP levels in fetal blood and amniotic fluid rise until about 12 weeks. The levels then gradually fall until birth. AFP is a protein produce by fetal tissue. AFP crosses the placenta and appears in the maternal blood. A baby with an open neural tube defect has an opening in its spine, head, or abdominal wall that allows higher-than-usual amounts of AFP to pass into the mother's blood. If a screen is positive, more tests are needed to make a diagnosis. These include ultrasound and perhaps amniocentesis (checking the fluid that surrounds the baby). These tests are used to help women and their caregivers make decisions about the management of their pregnancies. In pregnancies where the fetus is carrying the chromosomal defect that results in Down syndrome, the levels of AFP and unconjugated estriol tend to be low and hCG and inhibin A levels high.  PREPARATION FOR TEST Blood is drawn from a vein in your arm usually between the 15th and 20th weeks of pregnancy. Four different tests on your blood are done. These are AFP, hCG, unconjugated estriol, and inhibin A. The combination of tests produces a more accurate result. NORMAL FINDINGS   Adult: less than 40ng/mL or less than 40 mg/L (SI units)  Child younger than1 year: less than 30 ng/mL Ranges are stratified by weeks of gestation and vary among laboratories. Ranges for normal findings may vary among different laboratories and hospitals. You  should always check with your doctor after having lab work or other tests done to discuss the meaning of your test results and whether your values are considered within normal limits. MEANING OF TEST  These are screening tests. Not all fetal abnormalities will give positive test results. Of all women who have positive AFP screening results, only a very small number of them have babies who actually have a neural tube defect or chromosomal abnormality. Your caregiver will go over the test results with you and discuss the importance and meaning of your results, as well as treatment options and the need for additional tests if necessary. OBTAINING THE TEST RESULTS It is your responsibility to obtain your test results. Ask the lab or department performing the test when and how you will get your results. Document Released: 12/05/2004 Document Revised: 02/05/2012 Document Reviewed: 10/17/2008 [facility] Patient Information 2013 [place], [place].

## 2013-03-26 ENCOUNTER — Ambulatory Visit (HOSPITAL_COMMUNITY): Payer: Medicaid Other

## 2013-04-02 ENCOUNTER — Encounter: Payer: Self-pay | Admitting: *Deleted

## 2013-04-02 ENCOUNTER — Encounter: Payer: Self-pay | Admitting: Obstetrics & Gynecology

## 2013-04-22 ENCOUNTER — Encounter: Payer: Medicaid Other | Admitting: Obstetrics & Gynecology

## 2013-05-06 ENCOUNTER — Ambulatory Visit (INDEPENDENT_AMBULATORY_CARE_PROVIDER_SITE_OTHER): Payer: Medicaid Other | Admitting: Obstetrics & Gynecology

## 2013-05-06 ENCOUNTER — Encounter: Payer: Self-pay | Admitting: Obstetrics & Gynecology

## 2013-05-06 VITALS — BP 137/80 | Wt 158.9 lb

## 2013-05-06 DIAGNOSIS — O26839 Pregnancy related renal disease, unspecified trimester: Secondary | ICD-10-CM

## 2013-05-06 DIAGNOSIS — O1212 Gestational proteinuria, second trimester: Secondary | ICD-10-CM

## 2013-05-06 LAB — POCT URINALYSIS DIP (DEVICE)
Bilirubin Urine: NEGATIVE
Nitrite: NEGATIVE
Urobilinogen, UA: 0.2 mg/dL (ref 0.0–1.0)
pH: 7 (ref 5.0–8.0)

## 2013-05-06 NOTE — Progress Notes (Signed)
P = 109  Pt has sx of URI- cough, runny nose.  OTC meds informed

## 2013-05-06 NOTE — Patient Instructions (Signed)
Glucose Tolerance Test During Pregnancy  The glucose tolerance test (GTT) or 3-hour glucose test can be used to determine if a woman has diabetes that first begins or is first recognized during pregnancy (gestational diabetes). Typically, a GTT is done after you have had a 1-hour glucose test with results that indicate you possibly have gestational diabetes.   The test takes about 3 hours. There will be a series of blood tests after you drink the sugar water solution. You must remain at the testing location to make sure that your blood is drawn on time.   LET YOUR CAREGIVER KNOW ABOUT:  · Allergies to food or medicine.  · Medicines taken, including vitamins, herbs, eyedrops, over-the-counter medicines, and creams.  · Any recent illnesses or infections.  BEFORE THE PROCEDURE  The GTT is a fasting test, meaning you must stop eating for a certain amount of time. The test will be the most accurate if you have not eaten for 8 12 hours before the test. For this reason, it is recommended that you have this test done in the morning before you have breakfast.  PROCEDURE   Do not eat or drink anything but water during the test. When you arrive at the lab, a sample of your blood is taken to get your fasting blood glucose level. After your fasting glucose level is determined, you will be given a sugar water solution to drink. You will be asked to wait in a certain area until your next blood test. The blood tests are done each hour for 3 hours. Stay close to the lab so your blood samples can be taken on time. This is important. If the blood samples are not taken on time, the test will need to be done again on another day.   AFTER THE PROCEDURE  · You can eat and drink as usual.    · Ask when your test results will be ready. Make sure you get your test results. A positive test is considered when two of the four blood test values are equal or above the normal blood glucose level.  Document Released: 05/14/2012 Document Reviewed:  05/14/2012  Exit[employer]® Patient Information ©2014 [employer].

## 2013-05-06 NOTE — Progress Notes (Signed)
Pt with no problems 28 week labs next visit

## 2013-05-26 ENCOUNTER — Encounter (HOSPITAL_COMMUNITY): Payer: Self-pay

## 2013-05-26 ENCOUNTER — Inpatient Hospital Stay (HOSPITAL_COMMUNITY)
Admission: AD | Admit: 2013-05-26 | Discharge: 2013-05-26 | Disposition: A | Discharge status: Home / Self Care | Payer: Medicaid Other | Source: Ambulatory Visit | Attending: Obstetrics & Gynecology | Admitting: Obstetrics & Gynecology

## 2013-05-26 DIAGNOSIS — R51 Headache: Secondary | ICD-10-CM | POA: Insufficient documentation

## 2013-05-26 DIAGNOSIS — O219 Vomiting of pregnancy, unspecified: Secondary | ICD-10-CM

## 2013-05-26 DIAGNOSIS — O26839 Pregnancy related renal disease, unspecified trimester: Secondary | ICD-10-CM

## 2013-05-26 DIAGNOSIS — O212 Late vomiting of pregnancy: Secondary | ICD-10-CM | POA: Insufficient documentation

## 2013-05-26 DIAGNOSIS — O1212 Gestational proteinuria, second trimester: Secondary | ICD-10-CM

## 2013-05-26 LAB — URINALYSIS, ROUTINE W REFLEX MICROSCOPIC
Bilirubin Urine: NEGATIVE
Ketones, ur: 15 mg/dL — AB
Nitrite: NEGATIVE
Protein, ur: 30 mg/dL — AB
Specific Gravity, Urine: 1.025 (ref 1.005–1.030)
Urobilinogen, UA: 1 mg/dL (ref 0.0–1.0)

## 2013-05-26 LAB — URINE MICROSCOPIC-ADD ON

## 2013-05-26 MED ORDER — ONDANSETRON 4 MG PO TBDP: 4.0000 mg | ORAL_TABLET | Freq: Four times a day (QID) | ORAL | Status: DC | PRN

## 2013-05-26 MED ORDER — ACETAMINOPHEN 325 MG PO TABS
650.0000 mg | ORAL_TABLET | Freq: Once | ORAL | Status: AC
  Administered 2013-05-26: 650 mg via ORAL
  Filled 2013-05-26: qty 2

## 2013-05-26 MED ORDER — ONDANSETRON 8 MG PO TBDP
8.0000 mg | ORAL_TABLET | Freq: Once | ORAL | Status: AC
  Administered 2013-05-26: 8 mg via ORAL
  Filled 2013-05-26: qty 1

## 2013-05-26 NOTE — MAU Note (Signed)
Patient states has been vomiting for 1 1/2 days, unable to keep anything down and has seen blood in vomit. States she has burning in the throat with vomiting. Denies abdominal pain, bleeding or leaking. Reports feeling fetal movement. States her upper body feels swollen today.

## 2013-05-26 NOTE — MAU Note (Signed)
Patient is in with c/o n/v and headache for a day. Denies abdominal pain, vaginal bleeding or lof. She reports good fetal movement.

## 2013-05-26 NOTE — MAU Provider Note (Signed)
Attestation of Attending Supervision of Advanced Practitioner (CNM/NP): Evaluation and management procedures were performed by the Advanced Practitioner under my supervision and collaboration. I have reviewed the Advanced Practitioner's note and chart, and I agree with the management and plan.  Eliazer Hemphill H. 2:32 PM   
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
## 2013-05-27 LAB — URINE CULTURE
Colony Count: NO GROWTH
Culture: NO GROWTH

## 2013-06-03 ENCOUNTER — Ambulatory Visit (INDEPENDENT_AMBULATORY_CARE_PROVIDER_SITE_OTHER): Payer: Medicaid Other | Admitting: Obstetrics & Gynecology

## 2013-06-03 VITALS — BP 107/67 | Wt 161.1 lb

## 2013-06-03 DIAGNOSIS — O1212 Gestational proteinuria, second trimester: Secondary | ICD-10-CM

## 2013-06-03 DIAGNOSIS — Z3493 Encounter for supervision of normal pregnancy, unspecified, third trimester: Secondary | ICD-10-CM

## 2013-06-03 DIAGNOSIS — K219 Gastro-esophageal reflux disease without esophagitis: Secondary | ICD-10-CM

## 2013-06-03 DIAGNOSIS — O26839 Pregnancy related renal disease, unspecified trimester: Secondary | ICD-10-CM

## 2013-06-03 LAB — POCT URINALYSIS DIP (DEVICE)
Nitrite: NEGATIVE
Protein, ur: 30 mg/dL — AB
Urobilinogen, UA: 0.2 mg/dL (ref 0.0–1.0)
pH: 7 (ref 5.0–8.0)

## 2013-06-03 MED ORDER — PANTOPRAZOLE SODIUM 40 MG PO TBEC: 40.0000 mg | DELAYED_RELEASE_TABLET | Freq: Every day | ORAL | Status: DC

## 2013-06-03 NOTE — Patient Instructions (Signed)
Heartburn During Pregnancy   Heartburn is a burning sensation in the chest caused by stomach acid backing up into the esophagus. Heartburn (also known as "reflux") is common in pregnancy because a certain hormone (progesterone) changes. The progesterone hormone may relax the valve that separates the esophagus from the stomach. This allows acid to go up into the esophagus, causing heartburn. Heartburn may also happen in pregnancy because the enlarging uterus pushes up on the stomach, which pushes more acid into the esophagus. This is especially true in the later stages of pregnancy. Heartburn problems usually go away after giving birth.  CAUSES   · The progesterone hormone.  · Changing hormone levels.  · The growing uterus that pushes stomach acid upward.  · Large meals.  · Certain foods and drinks.  · Exercise.  · Increased acid production.  SYMPTOMS   · Burning pain in the chest or lower throat.  · Bitter taste in the mouth.  · Coughing.  DIAGNOSIS   Heartburn is typically diagnosed by your caregiver when taking a careful history of your concern. Your caregiver may order a blood test to check for a certain type of bacteria that is associated with heartburn. Sometimes, heartburn is diagnosed by prescribing a heartburn medicine to see if the symptoms improve. It is rare in pregnancy to have a procedure called an endoscopy. This is when a tube with a light and a camera on the end is used to examine the esophagus and the stomach.  TREATMENT   · Your caregiver may tell you to use certain over-the-counter medicines (antacids, acid reducers) for mild heartburn.  · Your caregiver may prescribe medicines to decrease stomach acid or to protect your stomach lining.  · Your caregiver may recommend certain diet changes.  · For severe cases, your caregiver may recommend that the head of the bed be elevated on blocks. (Sleeping with more pillows is not an effective treatment as it only changes the position of your head and does  not improve the main problem of stomach acid refluxing into the esophagus.)  HOME CARE INSTRUCTIONS   · Take all medicines as directed by your caregiver.  · Raise the head of your bed by putting blocks under the legs if instructed to by your caregiver.  · Do not exercise right after eating.  · Avoid eating 2 or 3 hours before bed. Do not lie down right after eating.  · Eat small meals throughout the day instead of 3 large meals.  · Identify foods and beverages that make your symptoms worse and avoid them. Foods you may want to avoid include:  · Peppers.  · Chocolate.  · High-fat foods, including fried foods.  · Spicy foods.  · Garlic and onions.  · Citrus fruits, including oranges, grapefruit, lemons, and limes.  · Food containing tomatoes or tomato products.  · Mint.  · Carbonated and caffeinated drinks.  · Vinegar.  SEEK IMMEDIATE MEDICAL CARE IF:   · You have severe chest pain that goes down your arm or into your jaw or neck.  · You feel sweaty, dizzy, or lightheaded.  · You become short of breath.  · You vomit blood.  · You have difficulty or pain with swallowing.  · You have bloody or black, tarry stools.  · You have episodes of heartburn more than 3 times a week, for more than 2 weeks.  MAKE SURE YOU:  · Understand these instructions.  · Will watch your condition.  · Will get 

## 2013-06-03 NOTE — Progress Notes (Signed)
Reflux frequent, will Rx Protonix

## 2013-06-03 NOTE — Progress Notes (Signed)
P = 96   1 hr GTT today- vomited glucola- will return tomorrow for GTT only.

## 2013-06-04 ENCOUNTER — Other Ambulatory Visit: Payer: Medicaid Other

## 2013-06-04 DIAGNOSIS — Z3483 Encounter for supervision of other normal pregnancy, third trimester: Secondary | ICD-10-CM

## 2013-06-04 LAB — CBC
MCH: 30.1 pg (ref 26.0–34.0)
MCHC: 34.3 g/dL (ref 30.0–36.0)
RDW: 13.8 % (ref 11.5–15.5)

## 2013-06-05 LAB — HIV ANTIBODY (ROUTINE TESTING W REFLEX): HIV: NONREACTIVE

## 2013-06-05 LAB — RPR

## 2013-06-05 LAB — GLUCOSE TOLERANCE, 1 HOUR (50G) W/O FASTING: Glucose, 1 Hour GTT: 77 mg/dL (ref 70–140)

## 2013-06-17 ENCOUNTER — Ambulatory Visit (INDEPENDENT_AMBULATORY_CARE_PROVIDER_SITE_OTHER): Payer: Medicaid Other | Admitting: Obstetrics and Gynecology

## 2013-06-17 ENCOUNTER — Encounter: Payer: Self-pay | Admitting: Obstetrics and Gynecology

## 2013-06-17 VITALS — BP 114/76 | Temp 98.7°F | Wt 163.5 lb

## 2013-06-17 DIAGNOSIS — O26839 Pregnancy related renal disease, unspecified trimester: Secondary | ICD-10-CM

## 2013-06-17 DIAGNOSIS — O1213 Gestational proteinuria, third trimester: Secondary | ICD-10-CM

## 2013-06-17 DIAGNOSIS — Z3493 Encounter for supervision of normal pregnancy, unspecified, third trimester: Secondary | ICD-10-CM

## 2013-06-17 DIAGNOSIS — O98519 Other viral diseases complicating pregnancy, unspecified trimester: Secondary | ICD-10-CM

## 2013-06-17 LAB — POCT URINALYSIS DIP (DEVICE)
Protein, ur: 100 mg/dL — AB
Urobilinogen, UA: 0.2 mg/dL (ref 0.0–1.0)
pH: 7.5 (ref 5.0–8.0)

## 2013-06-17 NOTE — Patient Instructions (Signed)
Contraception Choices Birth control (contraception) can stop pregnancy from happening. Different types of birth control work in different ways. Some can:  Make the mucus in the cervix thick. This makes it hard for sperm to get into the uterus.  Thin the lining of the uterus. This makes it hard for an egg to attach to the wall of the uterus.  Stop the ovaries from releasing an egg.  Block the sperm from reaching the egg. Certain types of surgery can stop pregnancy from happening. For women, the sugery closes the fallopian tubes (tubal ligation). For men, the surgery stops sperm from releasing during sex (vasectomy). HORMONAL BIRTH CONTROL Hormonal birth control stops pregnancy by putting hormones into your body. Types of birth control include:  A small tube put under the skin of the upper arm (implant). The tube can stay in place for 3 years.  Shots given every 3 months.  Pills taken every day or once after sex (intercourse).  Patches that are changed once a week.  A ring put into the vagina (vaginal ring). The ring is left in place for 3 weeks and removed for 1 week. Then, a new ring is put in the vagina. BARRIER BIRTH CONTROL  Barrier birth control blocks sperm from reaching the egg. Types of birth control include:   A thin covering worn on the penis (female condom) during sex.  A soft, loose covering put into the vagina (female condom) before sex.  A rubber bowl that sits over the cervix (diaphragm). The bowl must be made for you. The bowl is put into the vagina before sex. The bowl is left in place for 6 to 8 hours after sex.  A small, soft cup that fits over the cervix (cervical cap). The cup must be made for you. The cup can be left in place for 48 hours after sex.  A sponge that is put into the vagina before sex.  A chemical that kills or blocks sperm from getting into the cervix and uterus (spermicide). The chemical may be a cream, jelly, foam, or pill. INTRAUTERINE (IUD)  BIRTH CONTROL  IUD birth control is a small, T-shaped piece of plastic. The plastic is put inside the uterus. There are 2 types of IUD:  Copper IUD. The IUD is covered in copper wire. The copper makes a fluid that kills sperm. It can stay in place for 10 years.  Hormone IUD. The hormone stops pregnancy from happening. It can stay in place for 5 years. NATURAL FAMILY PLANNING BIRTH CONTROL  Natural family planning means not having sex or using barrier birth control when the woman is fertile. A woman can:  Use a calendar to keep track of when she is fertile.  Use a thermometer to measure her body temperature. Protect yourself against sexual diseases no matter what type of birth control you use. Talk to your doctor about which type of birth control is best for you. Document Released: 09/10/2009 Document Revised: 02/05/2012 Document Reviewed: 03/22/2011 [employer] Patient Information 2014 [employer].  

## 2013-06-17 NOTE — Progress Notes (Signed)
P = 102   Pt has not obtained Rx for protonix- still having reflux- advised to get Rx.

## 2013-06-17 NOTE — Progress Notes (Signed)
Patient doing well without complaints. FM/PTL precautions reviewed. Patient has not yet filled rx for protonix. Encouraged to get to aid with heartburn. Patient left before UA was available for review. Will have her collect 24 hr urine for protein and further evaluation of this proteinuria

## 2013-06-19 LAB — CULTURE, OB URINE: Colony Count: 4000

## 2013-07-01 ENCOUNTER — Ambulatory Visit (INDEPENDENT_AMBULATORY_CARE_PROVIDER_SITE_OTHER): Payer: Medicaid Other | Admitting: Obstetrics & Gynecology

## 2013-07-01 VITALS — BP 124/73 | Temp 97.0°F | Wt 163.4 lb

## 2013-07-01 DIAGNOSIS — Z3493 Encounter for supervision of normal pregnancy, unspecified, third trimester: Secondary | ICD-10-CM

## 2013-07-01 DIAGNOSIS — O98519 Other viral diseases complicating pregnancy, unspecified trimester: Secondary | ICD-10-CM

## 2013-07-01 DIAGNOSIS — O26839 Pregnancy related renal disease, unspecified trimester: Secondary | ICD-10-CM

## 2013-07-01 LAB — POCT URINALYSIS DIP (DEVICE)
Bilirubin Urine: NEGATIVE
Hgb urine dipstick: NEGATIVE
Ketones, ur: NEGATIVE mg/dL
Protein, ur: 30 mg/dL — AB
pH: 7 (ref 5.0–8.0)

## 2013-07-01 MED ORDER — FLUCONAZOLE 150 MG PO TABS: 150.0000 mg | ORAL_TABLET | Freq: Once | ORAL | Status: DC

## 2013-07-01 NOTE — Progress Notes (Signed)
Discharge is c/w yeast, wet prep sent.

## 2013-07-01 NOTE — Progress Notes (Signed)
Pulse- 83 Patient reports hip pressure in mornings, also reports "runny" white d/c with slight odor

## 2013-07-01 NOTE — Patient Instructions (Signed)

## 2013-07-02 LAB — WET PREP, GENITAL: Clue Cells Wet Prep HPF POC: NONE SEEN

## 2013-07-08 ENCOUNTER — Telehealth: Payer: Self-pay

## 2013-07-08 MED ORDER — VALACYCLOVIR HCL 500 MG PO TABS: 500.0000 mg | ORAL_TABLET | Freq: Two times a day (BID) | ORAL | Status: DC

## 2013-07-08 NOTE — Telephone Encounter (Signed)
Pt called and stated that she is currently having an outbreak and would like a refill on acyclovir.  Called pt and pt informed me that she is having an outbreak.  Pt is 34 weeks.  I advised pt that we will just go ahead treat her with valtrex for suppression as if the provider would have started at 36 weeks usally.  Per Dr. Jolayne Panther that is ok for her to start taking the valtrex.  I verified pharmacy with pt and asked if she could give them a couple of hours before picking up.  Pt stated understanding.

## 2013-07-15 ENCOUNTER — Encounter: Payer: Medicaid Other | Admitting: Obstetrics & Gynecology

## 2013-07-16 ENCOUNTER — Ambulatory Visit (INDEPENDENT_AMBULATORY_CARE_PROVIDER_SITE_OTHER): Payer: Medicaid Other | Admitting: Advanced Practice Midwife

## 2013-07-16 VITALS — BP 128/83 | Wt 165.7 lb

## 2013-07-16 DIAGNOSIS — K219 Gastro-esophageal reflux disease without esophagitis: Secondary | ICD-10-CM

## 2013-07-16 DIAGNOSIS — O98519 Other viral diseases complicating pregnancy, unspecified trimester: Secondary | ICD-10-CM

## 2013-07-16 LAB — POCT URINALYSIS DIP (DEVICE)
Bilirubin Urine: NEGATIVE
Ketones, ur: NEGATIVE mg/dL
Specific Gravity, Urine: 1.02 (ref 1.005–1.030)

## 2013-07-16 NOTE — Progress Notes (Signed)
GBS at next visit, discussed.

## 2013-07-16 NOTE — Progress Notes (Signed)
Pulse: 97

## 2013-07-30 ENCOUNTER — Ambulatory Visit (INDEPENDENT_AMBULATORY_CARE_PROVIDER_SITE_OTHER): Payer: Medicaid Other | Admitting: Family Medicine

## 2013-07-30 VITALS — BP 119/75 | Temp 98.0°F | Wt 166.0 lb

## 2013-07-30 DIAGNOSIS — Z3493 Encounter for supervision of normal pregnancy, unspecified, third trimester: Secondary | ICD-10-CM

## 2013-07-30 DIAGNOSIS — Z3483 Encounter for supervision of other normal pregnancy, third trimester: Secondary | ICD-10-CM

## 2013-07-30 DIAGNOSIS — Z23 Encounter for immunization: Secondary | ICD-10-CM

## 2013-07-30 DIAGNOSIS — O26839 Pregnancy related renal disease, unspecified trimester: Secondary | ICD-10-CM

## 2013-07-30 DIAGNOSIS — O98519 Other viral diseases complicating pregnancy, unspecified trimester: Secondary | ICD-10-CM

## 2013-07-30 LAB — POCT URINALYSIS DIP (DEVICE)
Bilirubin Urine: NEGATIVE
Glucose, UA: NEGATIVE mg/dL
Hgb urine dipstick: NEGATIVE
Ketones, ur: NEGATIVE mg/dL
Specific Gravity, Urine: 1.02 (ref 1.005–1.030)

## 2013-07-30 LAB — OB RESULTS CONSOLE GBS: GBS: POSITIVE

## 2013-07-30 MED ORDER — TETANUS-DIPHTH-ACELL PERTUSSIS 5-2.5-18.5 LF-MCG/0.5 IM SUSP
0.5000 mL | Freq: Once | INTRAMUSCULAR | Status: AC
  Administered 2013-07-30: 0.5 mL via INTRAMUSCULAR

## 2013-07-30 NOTE — Progress Notes (Signed)
  Subjective:    Jody Perez is a 22 y.o. female being seen today for her obstetrical visit. She is at [redacted]w[redacted]d gestation. Patient reports no bleeding, no contractions, no cramping and no leaking. Fetal movement: normal.  Menstrual History: OB History   Grav Para Term Preterm Abortions TAB SAB Ect Mult Living   2    1  1    0       Patient's last menstrual period was 10/01/2012.    The following portions of the patient's history were reviewed and updated as appropriate: allergies, current medications, past family history, past medical history, past social history, past surgical history and problem list.  Review of Systems Pertinent items are noted in HPI.   Objective:    BP 119/75  Temp(Src) 98 F (36.7 C)  Wt 75.297 kg (166 lb)  BMI 28.94 kg/m2  LMP 10/01/2012 FHT:  138 BPM  Uterine Size: 34 cm  Presentation:      Assessment:   Jody Perez is a 22 y.o. G2P0010 at [redacted]w[redacted]d here for ROB visit.  Discussed with Patient:  -Plans to breast feed.  All questions answered. -Continue prenatal vitamins. -Reviewed fetal kick counts Pt to perform daily at a time when the baby is active, lie laterally with both hands on belly in quiet room and count all movements (hiccups, shoulder rolls, obvious kicks, etc); pt is to report to clinic L&D for less than 10 movements felt in a one hour time period-pt told as soon as she counts 10 movements the count is complete.  - Routine precautions discussed (depression, infection s/s).   Patient provided with all pertinent phone numbers for emergencies. - RTC for any VB, regular, painful cramps/ctxs occurring at a rate of >2/10 min, fever (100.5 or higher), n/v/d, any pain that is unresolving or worsening, LOF, decreased fetal movement, CP, SOB, edema - RTC in 2 weeks for next appt. - Did GBS swabs today and will f/u results and call if abnormal.  Contact#:  Pt has no drug allergies, so will get PCN if GBS+ OR will get sensitivities on GBS swab as pt is  PCN-allergic. - GC/C  Problems: Patient Active Problem List   Diagnosis Date Noted  . GERD (gastroesophageal reflux disease) 06/03/2013  . Proteinuria complicating pregnancy 03/25/2013  . Supervision of normal pregnancy 01/28/2013  . 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 Tdap will be given today  [ ]  Vaccines: Flu:  Tdap:  [ ]  BCM: Mirena [ ]  Readiness: baby has a place to sleep, car seat, other baby necessities.  Edu: [x ] PTL precautions; [ ]  BF class; [ ]  childbirth class; [ ]   BF counseling;

## 2013-07-30 NOTE — Progress Notes (Signed)
Pulse: 86

## 2013-07-30 NOTE — Addendum Note (Signed)
Addended by: Franchot Mimes on: 07/30/2013 01:42 PM   Modules accepted: Orders

## 2013-07-30 NOTE — Patient Instructions (Signed)

## 2013-07-31 LAB — GC/CHLAMYDIA PROBE AMP
CT Probe RNA: NEGATIVE
GC Probe RNA: NEGATIVE

## 2013-08-03 LAB — CULTURE, BETA STREP (GROUP B ONLY)

## 2013-08-04 ENCOUNTER — Encounter: Payer: Self-pay | Admitting: *Deleted

## 2013-08-05 ENCOUNTER — Encounter: Payer: Self-pay | Admitting: Family Medicine

## 2013-08-06 ENCOUNTER — Ambulatory Visit (INDEPENDENT_AMBULATORY_CARE_PROVIDER_SITE_OTHER): Payer: Medicaid Other | Admitting: Family Medicine

## 2013-08-06 ENCOUNTER — Encounter: Payer: Self-pay | Admitting: Family Medicine

## 2013-08-06 VITALS — BP 134/82 | Temp 97.3°F | Wt 168.8 lb

## 2013-08-06 DIAGNOSIS — O98519 Other viral diseases complicating pregnancy, unspecified trimester: Secondary | ICD-10-CM

## 2013-08-06 LAB — POCT URINALYSIS DIP (DEVICE)
Bilirubin Urine: NEGATIVE
Glucose, UA: NEGATIVE mg/dL
Nitrite: NEGATIVE
Specific Gravity, Urine: 1.015 (ref 1.005–1.030)
Urobilinogen, UA: 0.2 mg/dL (ref 0.0–1.0)

## 2013-08-06 NOTE — Progress Notes (Signed)
  Subjective:    Jody Perez is a 22 y.o. female being seen today for her obstetrical visit. She is at [redacted]w[redacted]d gestation. Patient reports no bleeding, no contractions, no cramping and no leaking. Fetal movement: normal.  Menstrual History: OB History   Grav Para Term Preterm Abortions TAB SAB Ect Mult Living   2    1  1    0       Patient's last menstrual period was 10/01/2012.    The following portions of the patient's history were reviewed and updated as appropriate: allergies, current medications, past family history, past medical history, past social history, past surgical history and problem list.  Review of Systems Pertinent items are noted in HPI.   Objective:    BP 134/82  Temp(Src) 97.3 F (36.3 C)  Wt 168 lb 12.8 oz (76.567 kg)  BMI 29.43 kg/m2  LMP 10/01/2012 FHT: 144 BPM  Uterine Size: 37 cm and size equals dates  Presentations:   Pelvic Exam:                          Assessment:   [name] is a 22 y.o. G2P0010 at [redacted]w[redacted]d here for ROB visit.    Discussed with Patient:  - Plans to breast/bottle feed.  All questions answered. - Continue prenatal vitamins. - Reviewed fetal kick counts Pt to perform daily at a time when the baby is active, lie laterally with both hands on belly in quiet room and count all movements (hiccups, shoulder rolls, obvious kicks, etc); pt is to report to clinic MAU for less than 10 movements felt in a 2 hour time period-pt told as soon as she counts 10 movements the count is complete.  - Routine precautions discussed (depression, infection s/s).   Patient provided with all pertinent phone numbers for emergencies. - RTC for any VB, regular, painful cramps/ctxs occurring at a rate of >2/10 min, fever (100.5 or higher), n/v/d, any pain that is unresolving or worsening, LOF, decreased fetal movement, CP, SOB, edema -RTC in one week for next visit.  Problems: Patient Active Problem List   Diagnosis Date Noted  . GERD (gastroesophageal  reflux disease) 06/03/2013  . Proteinuria complicating pregnancy 03/25/2013  . Supervision of normal pregnancy 01/28/2013  . 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   [x ] Vaccines: Flu:  Tdap: rec'd [x ] BCM: mirena [ ]  Readiness: baby has a place to sleep, car seat, other baby necessities.  Edu: [ x] TL precautions; [ ]  BF class; [ ]  childbirth class; [ ]   BF counseling;

## 2013-08-06 NOTE — Progress Notes (Signed)
Pulse: 93

## 2013-08-06 NOTE — Patient Instructions (Signed)

## 2013-08-11 ENCOUNTER — Encounter (HOSPITAL_COMMUNITY): Payer: Self-pay | Admitting: *Deleted

## 2013-08-11 ENCOUNTER — Observation Stay (HOSPITAL_COMMUNITY)
Admission: AD | Admit: 2013-08-11 | Discharge: 2013-08-12 | Disposition: A | Discharge status: Home / Self Care | Payer: No Typology Code available for payment source | Source: Ambulatory Visit | Attending: Obstetrics and Gynecology | Admitting: Obstetrics and Gynecology

## 2013-08-11 DIAGNOSIS — R51 Headache: Secondary | ICD-10-CM | POA: Diagnosis not present

## 2013-08-11 DIAGNOSIS — Z3490 Encounter for supervision of normal pregnancy, unspecified, unspecified trimester: Secondary | ICD-10-CM

## 2013-08-11 DIAGNOSIS — R109 Unspecified abdominal pain: Secondary | ICD-10-CM | POA: Insufficient documentation

## 2013-08-11 DIAGNOSIS — O368190 Decreased fetal movements, unspecified trimester, not applicable or unspecified: Secondary | ICD-10-CM

## 2013-08-11 DIAGNOSIS — Y92410 Unspecified street and highway as the place of occurrence of the external cause: Secondary | ICD-10-CM | POA: Diagnosis not present

## 2013-08-11 DIAGNOSIS — O99019 Anemia complicating pregnancy, unspecified trimester: Secondary | ICD-10-CM | POA: Diagnosis not present

## 2013-08-11 DIAGNOSIS — Z3493 Encounter for supervision of normal pregnancy, unspecified, third trimester: Secondary | ICD-10-CM

## 2013-08-11 DIAGNOSIS — D649 Anemia, unspecified: Secondary | ICD-10-CM | POA: Diagnosis not present

## 2013-08-11 DIAGNOSIS — O479 False labor, unspecified: Secondary | ICD-10-CM | POA: Insufficient documentation

## 2013-08-11 DIAGNOSIS — O99891 Other specified diseases and conditions complicating pregnancy: Principal | ICD-10-CM | POA: Insufficient documentation

## 2013-08-11 DIAGNOSIS — O9989 Other specified diseases and conditions complicating pregnancy, childbirth and the puerperium: Secondary | ICD-10-CM

## 2013-08-11 LAB — CBC
HCT: 32.4 % — ABNORMAL LOW (ref 36.0–46.0)
Platelets: 250 10*3/uL (ref 150–400)
RDW: 14.3 % (ref 11.5–15.5)
WBC: 8.6 10*3/uL (ref 4.0–10.5)

## 2013-08-11 MED ORDER — DOCUSATE SODIUM 100 MG PO CAPS
100.0000 mg | ORAL_CAPSULE | Freq: Every day | ORAL | Status: DC
  Administered 2013-08-12: 100 mg via ORAL
  Filled 2013-08-11: qty 1

## 2013-08-11 MED ORDER — PRENATAL MULTIVITAMIN CH
1.0000 | ORAL_TABLET | Freq: Every day | ORAL | Status: DC
  Administered 2013-08-12: 1 via ORAL
  Filled 2013-08-11: qty 1

## 2013-08-11 MED ORDER — CALCIUM CARBONATE ANTACID 500 MG PO CHEW: 2.0000 | CHEWABLE_TABLET | ORAL | Status: DC | PRN

## 2013-08-11 MED ORDER — ZOLPIDEM TARTRATE 5 MG PO TABS: 5.0000 mg | ORAL_TABLET | Freq: Every evening | ORAL | Status: DC | PRN

## 2013-08-11 MED ORDER — ACETAMINOPHEN 325 MG PO TABS: 650.0000 mg | ORAL_TABLET | ORAL | Status: DC | PRN

## 2013-08-11 NOTE — Progress Notes (Signed)
At bedside with pt. ED staff evaluating pt.

## 2013-08-11 NOTE — ED Notes (Signed)
Patient given blankets for comfort

## 2013-08-11 NOTE — Progress Notes (Signed)
Report called to Antenatal unit. Pt will be transferred to Antenatal bed 157 for observation in Dr. Deretha Emory care. Report given to Casimiro Needle, RN.
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
## 2013-08-11 NOTE — Progress Notes (Signed)
U/S performed by ED MD. FHR assessed. Vertex position

## 2013-08-11 NOTE — ED Notes (Signed)
Patient in NAD at this time

## 2013-08-11 NOTE — Progress Notes (Signed)
Report to [facility], called Antenatal and made aware pt will be arriving soon

## 2013-08-11 NOTE — ED Notes (Signed)
Patient is resting comfortably. 

## 2013-08-12 ENCOUNTER — Observation Stay (HOSPITAL_COMMUNITY): Payer: No Typology Code available for payment source

## 2013-08-12 DIAGNOSIS — O479 False labor, unspecified: Secondary | ICD-10-CM

## 2013-08-12 DIAGNOSIS — O368190 Decreased fetal movements, unspecified trimester, not applicable or unspecified: Secondary | ICD-10-CM

## 2013-08-12 DIAGNOSIS — R109 Unspecified abdominal pain: Secondary | ICD-10-CM
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
## 2013-08-12 NOTE — Progress Notes (Signed)
UR completed 

## 2013-08-12 NOTE — H&P (Signed)
Attestation of Attending Supervision of Advanced Practitioner (CNM/NP): Evaluation and management procedures were performed by the Advanced Practitioner under my supervision and collaboration.  I have reviewed the Advanced Practitioner's note and chart, and I agree with the management and plan.  Ottis Sarnowski 08/12/2013 7:27 AM   

## 2013-08-13 ENCOUNTER — Encounter: Payer: Medicaid Other | Admitting: Advanced Practice Midwife

## 2013-08-17 ENCOUNTER — Encounter (HOSPITAL_COMMUNITY): Payer: Self-pay | Admitting: Anesthesiology

## 2013-08-17 ENCOUNTER — Encounter (HOSPITAL_COMMUNITY): Payer: Self-pay | Admitting: *Deleted

## 2013-08-17 ENCOUNTER — Inpatient Hospital Stay (HOSPITAL_COMMUNITY)
Admission: AD | Admit: 2013-08-17 | Discharge: 2013-08-20 | DRG: 774 | Disposition: A | Discharge status: Home / Self Care | Payer: Medicaid Other | Source: Ambulatory Visit | Attending: Obstetrics & Gynecology | Admitting: Obstetrics & Gynecology

## 2013-08-17 ENCOUNTER — Inpatient Hospital Stay (HOSPITAL_COMMUNITY): Payer: Medicaid Other | Admitting: Anesthesiology

## 2013-08-17 DIAGNOSIS — Z3493 Encounter for supervision of normal pregnancy, unspecified, third trimester: Secondary | ICD-10-CM

## 2013-08-17 DIAGNOSIS — Z22330 Carrier of Group B streptococcus: Secondary | ICD-10-CM

## 2013-08-17 DIAGNOSIS — O99892 Other specified diseases and conditions complicating childbirth: Secondary | ICD-10-CM | POA: Diagnosis present

## 2013-08-17 DIAGNOSIS — O4290 Premature rupture of membranes, unspecified as to length of time between rupture and onset of labor, unspecified weeks of gestation: Principal | ICD-10-CM | POA: Diagnosis present

## 2013-08-17 DIAGNOSIS — O98519 Other viral diseases complicating pregnancy, unspecified trimester: Secondary | ICD-10-CM

## 2013-08-17 LAB — RPR: RPR Ser Ql: NONREACTIVE

## 2013-08-17 LAB — CBC
HCT: 35.3 % — ABNORMAL LOW (ref 36.0–46.0)
Hemoglobin: 11.9 g/dL — ABNORMAL LOW (ref 12.0–15.0)
MCH: 30.2 pg (ref 26.0–34.0)
MCHC: 33.7 g/dL (ref 30.0–36.0)
MCV: 89.6 fL (ref 78.0–100.0)

## 2013-08-17 MED ORDER — DEXTROSE 5 % IV SOLN
5.0000 10*6.[IU] | Freq: Once | INTRAVENOUS | Status: AC
  Administered 2013-08-17: 5 10*6.[IU] via INTRAVENOUS
  Filled 2013-08-17: qty 5

## 2013-08-17 MED ORDER — PROMETHAZINE HCL 25 MG/ML IJ SOLN
12.5000 mg | Freq: Once | INTRAMUSCULAR | Status: AC
  Administered 2013-08-17: 12.5 mg via INTRAMUSCULAR
  Filled 2013-08-17: qty 1

## 2013-08-17 MED ORDER — FENTANYL 2.5 MCG/ML BUPIVACAINE 1/10 % EPIDURAL INFUSION (WH - ANES)
14.0000 mL/h | INTRAMUSCULAR | Status: DC | PRN
  Administered 2013-08-17: 14 mL/h via EPIDURAL
  Filled 2013-08-17: qty 125

## 2013-08-17 MED ORDER — OXYTOCIN 40 UNITS IN LACTATED RINGERS INFUSION - SIMPLE MED
1.0000 m[IU]/min | INTRAVENOUS | Status: DC
  Administered 2013-08-17: 2 m[IU]/min via INTRAVENOUS

## 2013-08-17 MED ORDER — PENICILLIN G POTASSIUM 5000000 UNITS IJ SOLR
2.5000 10*6.[IU] | INTRAVENOUS | Status: DC
  Administered 2013-08-17 (×3): 2.5 10*6.[IU] via INTRAVENOUS
  Filled 2013-08-17 (×7): qty 2.5

## 2013-08-17 MED ORDER — PHENYLEPHRINE 40 MCG/ML (10ML) SYRINGE FOR IV PUSH (FOR BLOOD PRESSURE SUPPORT)
PREFILLED_SYRINGE | INTRAVENOUS | Status: AC
  Filled 2013-08-17: qty 5

## 2013-08-17 MED ORDER — LIDOCAINE HCL (PF) 1 % IJ SOLN
INTRAMUSCULAR | Status: DC | PRN
  Administered 2013-08-17 (×2): 8 mL

## 2013-08-17 MED ORDER — FENTANYL 2.5 MCG/ML BUPIVACAINE 1/10 % EPIDURAL INFUSION (WH - ANES)
INTRAMUSCULAR | Status: DC | PRN
  Administered 2013-08-17: 14 mL/h via EPIDURAL

## 2013-08-17 MED ORDER — PHENYLEPHRINE 40 MCG/ML (10ML) SYRINGE FOR IV PUSH (FOR BLOOD PRESSURE SUPPORT)
80.0000 ug | PREFILLED_SYRINGE | INTRAVENOUS | Status: DC | PRN
  Filled 2013-08-17: qty 2

## 2013-08-17 MED ORDER — FENTANYL CITRATE 0.05 MG/ML IJ SOLN
100.0000 ug | INTRAMUSCULAR | Status: DC | PRN
  Administered 2013-08-17: 100 ug via INTRAVENOUS
  Filled 2013-08-17: qty 2

## 2013-08-17 MED ORDER — EPHEDRINE 5 MG/ML INJ
10.0000 mg | INTRAVENOUS | Status: DC | PRN
  Filled 2013-08-17: qty 2

## 2013-08-17 MED ORDER — TERBUTALINE SULFATE 1 MG/ML IJ SOLN: 0.2500 mg | Freq: Once | INTRAMUSCULAR | Status: AC | PRN

## 2013-08-17 MED ORDER — IBUPROFEN 600 MG PO TABS: 600.0000 mg | ORAL_TABLET | Freq: Four times a day (QID) | ORAL | Status: DC | PRN

## 2013-08-17 MED ORDER — OXYTOCIN BOLUS FROM INFUSION
500.0000 mL | INTRAVENOUS | Status: DC
  Administered 2013-08-18: 500 mL via INTRAVENOUS

## 2013-08-17 MED ORDER — OXYTOCIN 40 UNITS IN LACTATED RINGERS INFUSION - SIMPLE MED
62.5000 mL/h | INTRAVENOUS | Status: DC
  Filled 2013-08-17: qty 1000

## 2013-08-17 MED ORDER — ACETAMINOPHEN 325 MG PO TABS: 650.0000 mg | ORAL_TABLET | ORAL | Status: DC | PRN

## 2013-08-17 MED ORDER — CITRIC ACID-SODIUM CITRATE 334-500 MG/5ML PO SOLN: 30.0000 mL | ORAL | Status: DC | PRN

## 2013-08-17 MED ORDER — DIPHENHYDRAMINE HCL 50 MG/ML IJ SOLN: 12.5000 mg | INTRAMUSCULAR | Status: DC | PRN

## 2013-08-17 MED ORDER — VALACYCLOVIR HCL 500 MG PO TABS
500.0000 mg | ORAL_TABLET | Freq: Two times a day (BID) | ORAL | Status: DC
  Administered 2013-08-17 (×2): 500 mg via ORAL
  Filled 2013-08-17 (×3): qty 1

## 2013-08-17 MED ORDER — LACTATED RINGERS IV SOLN
INTRAVENOUS | Status: DC
  Administered 2013-08-17 (×2): via INTRAVENOUS

## 2013-08-17 MED ORDER — FENTANYL 2.5 MCG/ML BUPIVACAINE 1/10 % EPIDURAL INFUSION (WH - ANES)
INTRAMUSCULAR | Status: AC
  Filled 2013-08-17: qty 125

## 2013-08-17 MED ORDER — LACTATED RINGERS IV SOLN
500.0000 mL | INTRAVENOUS | Status: DC | PRN
  Administered 2013-08-17: 1000 mL via INTRAVENOUS
  Administered 2013-08-17: 500 mL via INTRAVENOUS

## 2013-08-17 MED ORDER — EPHEDRINE 5 MG/ML INJ
INTRAVENOUS | Status: AC
  Filled 2013-08-17: qty 4

## 2013-08-17 MED ORDER — OXYCODONE-ACETAMINOPHEN 5-325 MG PO TABS: 1.0000 | ORAL_TABLET | ORAL | Status: DC | PRN

## 2013-08-17 MED ORDER — ONDANSETRON HCL 4 MG/2ML IJ SOLN
4.0000 mg | Freq: Four times a day (QID) | INTRAMUSCULAR | Status: DC | PRN
  Administered 2013-08-17: 4 mg via INTRAVENOUS
  Filled 2013-08-17 (×2): qty 2

## 2013-08-17 MED ORDER — MORPHINE SULFATE 10 MG/ML IJ SOLN
10.0000 mg | Freq: Once | INTRAMUSCULAR | Status: AC
  Administered 2013-08-17: 10 mg via INTRAMUSCULAR
  Filled 2013-08-17: qty 1

## 2013-08-17 MED ORDER — LIDOCAINE HCL (PF) 1 % IJ SOLN
30.0000 mL | INTRAMUSCULAR | Status: DC | PRN
  Filled 2013-08-17 (×2): qty 30

## 2013-08-17 MED ORDER — LACTATED RINGERS IV SOLN
500.0000 mL | Freq: Once | INTRAVENOUS | Status: AC
  Administered 2013-08-17: 500 mL via INTRAVENOUS

## 2013-08-17 NOTE — Progress Notes (Signed)
Made provider aware of FHR, decels, and UC without adequate rest between contractions--orders to repeat 500cc bolus and continue to assess

## 2013-08-17 NOTE — Progress Notes (Signed)
Jody Perez is a 22 y.o. G2P0010 at [redacted]w[redacted]d admitted for active labor, rupture of membranes  Subjective: Contracting every 2 min - not getting much of a break between contractions and RN reports uterus continues to palpate firm. Epidural just placed. No concerns.   Objective: BP 113/67   Pulse 68   Temp(Src) 98.6 F (37 C) (Oral)   Resp 20   Ht 5\' 4"  (1.626 m)   Wt 77.565 kg (171 lb)   BMI 29.34 kg/m2   SpO2 100%   LMP 10/01/2012      FHT:  FHR: 140 bpm, variability: moderate,  accelerations:  Present,  decelerations:  Absent UC:   regular, every 2 minutes SVE:   Dilation: 5 Effacement (%): 100 Station: 0 Exam by:: J.Thornton, RN  Labs: Lab Results  Component Value Date   WBC 10.9* 08/17/2013   HGB 11.9* 08/17/2013   HCT 35.3* 08/17/2013   MCV 89.6 08/17/2013   PLT 279 08/17/2013    Assessment / Plan: Augmentation of labor, progressing well, holding pitocin for 1 hr due to tachysystole and then restart at lower dose.   Labor: Progressing normally Preeclampsia:  no signs or symptoms of toxicity Fetal Wellbeing:  Category I Pain Control:  Epidural I/D:  n/a Anticipated MOD:  NSVD  Marissa Calamity 08/17/2013, 4:09 PM

## 2013-08-17 NOTE — MAU Note (Signed)
Dr. Elroy Channel informed of SROM; states she is coming to write admit orders.

## 2013-08-17 NOTE — Anesthesia Preprocedure Evaluation (Signed)
Anesthesia Evaluation  Patient identified by MRN, date of birth, ID band Patient awake    Reviewed: Allergy & Precautions, H&P , Patient's Chart, lab work & pertinent test results  Airway Mallampati: I TM Distance: >3 FB Neck ROM: full    Dental no notable dental hx.    Pulmonary neg pulmonary ROS,    Pulmonary exam normal       Cardiovascular negative cardio ROS      Neuro/Psych negative neurological ROS  negative psych ROS   GI/Hepatic Neg liver ROS, GERD-  Medicated,  Endo/Other  negative endocrine ROS  Renal/GU negative Renal ROS  negative genitourinary   Musculoskeletal negative musculoskeletal ROS (+)   Abdominal Normal abdominal exam  (+)   Peds  Hematology negative hematology ROS (+)   Anesthesia Other Findings   Reproductive/Obstetrics (+) Pregnancy                           Anesthesia Physical Anesthesia Plan  ASA: II  Anesthesia Plan: Epidural   Post-op Pain Management:    Induction:   Airway Management Planned:   Additional Equipment:   Intra-op Plan:   Post-operative Plan:   Informed Consent: I have reviewed the patients History and Physical, chart, labs and discussed the procedure including the risks, benefits and alternatives for the proposed anesthesia with the patient or authorized representative who has indicated his/her understanding and acceptance.     Plan Discussed with:   Anesthesia Plan Comments:         Anesthesia Quick Evaluation

## 2013-08-17 NOTE — MAU Note (Signed)
Dr. Elroy Channel informed of latest vaginal exam, gravida and para, edc and positive gbs status as well as irregular contractions and patient discomfort that resulted in being given morphine and phenergan at 0705 and 0706 respectively. Order to ambulate for one hour and cervix recheck received.

## 2013-08-17 NOTE — H&P (Cosign Needed)
Jody Perez is a 22 y.o. female presenting for contractions since 0400. SROM in MAU @ 0900.  Maternal Medical History:  Reason for admission: Rupture of membranes and contractions.   Contractions: Onset was 3-5 hours ago.   Frequency: regular.   Perceived severity is moderate.    Fetal activity: Perceived fetal activity is normal.   Last perceived fetal movement was within the past hour.    Prenatal complications: No bleeding, HIV, hypertension, placental abnormality, pre-eclampsia or preterm labor.   Prenatal Complications - Diabetes: none.    OB History   Grav Para Term Preterm Abortions TAB SAB Ect Mult Living   2    1  1    0     Past Medical History  Diagnosis Date  . Chlamydia   . BV (bacterial vaginosis)   . Trichomonas   . UTI (lower urinary tract infection)   . HSV infection   . Herpes genitalis in women   . Miscarriage    Past Surgical History  Procedure Laterality Date  . No past surgeries     Family History: family history includes Hypertension in her brother and mother. Social History:  reports that she has never smoked. She has never used smokeless tobacco. She reports that she does not drink alcohol or use illicit drugs.   Prenatal Transfer Tool  Maternal Diabetes: No Genetic Screening: Normal Maternal Ultrasounds/Referrals: Normal Fetal Ultrasounds or other Referrals:  None Maternal Substance Abuse:  No Significant Maternal Medications:  Meds include: Other: Valtrex Significant Maternal Lab Results:  None Other Comments:  None  Review of Systems  Constitutional: Negative.   HENT: Negative.   Eyes: Negative.   Cardiovascular: Negative.   Gastrointestinal: Negative.   Skin: Negative.   All other systems reviewed and are negative.    Dilation: 3.5 Effacement (%): 80;90 Station: -2 Exam by:: SMurray, RN Blood pressure 118/59, pulse 76, temperature 97.5 F (36.4 C), resp. rate 22, height 5\' 4"  (1.626 m), weight 77.656 kg (171 lb 3.2 oz),  last menstrual period 10/01/2012. Maternal Exam:  Uterine Assessment: Contraction strength is moderate.  Contraction duration is 20 seconds. Contraction frequency is irregular.   Abdomen: Patient reports no abdominal tenderness. Introitus: Normal vulva. Normal vagina.  Ferning test: not done.  Nitrazine test: not done. Amniotic fluid character: clear.  Pelvis: adequate for delivery.   Cervix: Cervix evaluated by digital exam.     Fetal Exam Fetal Monitor Review: Mode: ultrasound.   Baseline rate: 120s.  Variability: moderate (6-25 bpm).   Pattern: accelerations present and no decelerations.    Fetal State Assessment: Category I - tracings are normal.     Physical Exam  Constitutional: She is oriented to person, place, and time. She appears well-developed and well-nourished.  HENT:  Head: Normocephalic and atraumatic.  Neck: Normal range of motion.  Cardiovascular: Normal rate.   Respiratory: Effort normal.  GI: Soft.  Neurological: She is alert and oriented to person, place, and time. She has normal reflexes.  Psychiatric: She has a normal mood and affect. Her behavior is normal. Judgment and thought content normal.    Prenatal labs: ABO, Rh: O/POS/-- (03/04 1105) Antibody: NEG (03/04 1105) Rubella: 4.82 (03/04 1105) RPR: NON REAC (07/09 1203)  HBsAg: NEGATIVE (03/04 1105)  HIV: NON REACTIVE (07/09 1203)  GBS: Positive (09/03 1342)   Assessment/Plan: G2P0010 @ 39.2w presenting in SOL with SROM. -- Admit to [employer] -- Augment labor as needed for good contraction pattern -- GBS positive  -- No active  genital lesions with h/o genital HSV  Jody Perez 08/17/2013, 9:13 AM  [name] is a 22 y.o. G2P0010 at [redacted]w[redacted]d here with PROM  #Labor: Augment with pitocin given SROM #Pain: PRN IV pain meds, may have epidural per request. #FWB: Category I #ID: GBS +, rec'd PCN, continue  I spoke with and examined patient and agree with resident's note and plan of  care.  Tawana Scale, MD OB Fellow 08/17/2013 12:51 PM

## 2013-08-17 NOTE — MAU Note (Signed)
FHR 125; moderate variabilty; contractions every 4-7 minutes x 50-100 seconds and mild-moderate intensity; off EFM to ambulate per order.
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
## 2013-08-17 NOTE — MAU Note (Signed)
IV of lactated ringers started with a 20g IV catheter in patient's right wrist area and infusing without difficulty with no s/s of infiltration noted at site and tolerated well by patient; bloodwork for CBC, RPR and blood type drawn during venipuncture and sent to lab.

## 2013-08-17 NOTE — Progress Notes (Signed)
Notified of diffculty to perform cervical exam. Patient is tensed, moves pelvic off bed and crying. Aware that patient is vomiting. Will enter orders for pain and anti-emetic medications.

## 2013-08-17 NOTE — MAU Note (Signed)
Patient states she felt a gush of fluid as she sat up to start ambulation; clear fluid noted on perineum.

## 2013-08-17 NOTE — Progress Notes (Signed)
Jody Perez is a 22 y.o. G2P0010 at [redacted]w[redacted]d admitted for SROM and induction of labor.  Subjective: Not contracting right now. Has received first dose of PCN. Not sure about epidural yet - may want IV pain medicine first before epidural if she decides to get it.   Objective: BP 132/71  Pulse 86  Temp(Src) 98.3 F (36.8 C) (Oral)  Resp 20  Ht 5\' 4"  (1.626 m)  Wt 77.565 kg (171 lb)  BMI 29.34 kg/m2  LMP 10/01/2012      FHT:  FHR: 130 bpm, variability: moderate,  accelerations:  Present,  decelerations:  Absent UC:   irregular, every 10 minutes SVE:   Dilation: 3.5 Effacement (%): 80;90 Station: -2 Exam by:: SMurray, RN  Labs: Lab Results  Component Value Date   WBC 10.9* 08/17/2013   HGB 11.9* 08/17/2013   HCT 35.3* 08/17/2013   MCV 89.6 08/17/2013   PLT 279 08/17/2013    Assessment / Plan: Induction of Labor 2/2 PROM. Start pitocin now that first dose of PCN infused.  Labor: Progressing normally, Will augment with pitocin. Preeclampsia:  no signs or symptoms of toxicity Fetal Wellbeing:  Category I Pain Control:  Labor support without medications, epidural or IV fentanyl upon request I/D:  n/a Anticipated MOD:  NSVD  Marissa Calamity 08/17/2013, 12:08 PM  I spoke with and examined patient and agree with resident's note and plan of care.  Tawana Scale, MD OB Fellow 08/17/2013 12:49 PM

## 2013-08-17 NOTE — Anesthesia Procedure Notes (Signed)
Epidural Patient location during procedure: OB Start time: 08/17/2013 3:24 AM End time: 08/17/2013 3:28 AM  Staffing Anesthesiologist: Sandrea Hughs Performed by: anesthesiologist   Preanesthetic Checklist Completed: patient identified, surgical consent, pre-op evaluation, timeout performed, IV checked, risks and benefits discussed and monitors and equipment checked  Epidural Patient position: sitting Prep: site prepped and draped and DuraPrep Patient monitoring: continuous pulse ox and blood pressure Approach: midline Injection technique: LOR air  Needle:  Needle type: Tuohy  Needle gauge: 17 G Needle length: 9 cm and 9 Needle insertion depth: 5 cm cm Catheter type: closed end flexible Catheter size: 19 Gauge Catheter at skin depth: 10 cm Test dose: negative and Other  Assessment Sensory level: T9 Events: blood not aspirated, injection not painful, no injection resistance, negative IV test and no paresthesia  Additional Notes Reason for block:procedure for pain

## 2013-08-17 NOTE — MAU Note (Signed)
Patient assisted to W/C and transported to [facility] #[id number].

## 2013-08-17 NOTE — Progress Notes (Signed)
Elta Angell is a 22 y.o. G2P0010 at [redacted]w[redacted]d admitted for active labor, rupture of membranes  Subjective: Pt off pitocin and making change  Objective: BP 115/89  Pulse 96  Temp(Src) 99.9 F (37.7 C) (Oral)  Resp 18  Ht 5\' 4"  (1.626 m)  Wt 77.565 kg (171 lb)  BMI 29.34 kg/m2  SpO2 100%  LMP 10/01/2012   Total I/O In: -  Out: 250 [Urine:250]  FHT:  FHR: 155 bpm, variability: moderate,  accelerations:  Abscent,  decelerations:  Present early UC:   regular, every 2-3 minutes SVE:   Dilation: Lip/rim Effacement (%): 100 Station: +2 Exam by:: V.Mensah RN  Labs: Lab Results  Component Value Date   WBC 10.9* 08/17/2013   HGB 11.9* 08/17/2013   HCT 35.3* 08/17/2013   MCV 89.6 08/17/2013   PLT 279 08/17/2013    Assessment / Plan: Augmentation of labor, progressing well, holding pitocin for 1 hr due to tachysystole and then restart at lower dose.   Labor: Progressing normally Preeclampsia:  no signs or symptoms of toxicity Fetal Wellbeing:  Category I Pain Control:  Epidural I/D:  n/a Anticipated MOD:  NSVD  Panhia Karl RYAN 08/17/2013, 9:34 PM

## 2013-08-18 ENCOUNTER — Encounter (HOSPITAL_COMMUNITY): Payer: Self-pay | Admitting: *Deleted

## 2013-08-18 MED ORDER — GENTAMICIN SULFATE 40 MG/ML IJ SOLN
170.0000 mg | Freq: Once | INTRAVENOUS | Status: AC
  Administered 2013-08-18: 170 mg via INTRAVENOUS
  Filled 2013-08-18: qty 4.25

## 2013-08-18 MED ORDER — ONDANSETRON HCL 4 MG/2ML IJ SOLN: 4.0000 mg | INTRAMUSCULAR | Status: DC | PRN

## 2013-08-18 MED ORDER — PRENATAL MULTIVITAMIN CH
1.0000 | ORAL_TABLET | Freq: Every day | ORAL | Status: DC
  Administered 2013-08-18 – 2013-08-19 (×2): 1 via ORAL
  Filled 2013-08-18 (×2): qty 1

## 2013-08-18 MED ORDER — ONDANSETRON HCL 4 MG PO TABS: 4.0000 mg | ORAL_TABLET | ORAL | Status: DC | PRN

## 2013-08-18 MED ORDER — SIMETHICONE 80 MG PO CHEW: 80.0000 mg | CHEWABLE_TABLET | ORAL | Status: DC | PRN

## 2013-08-18 MED ORDER — GENTAMICIN SULFATE 40 MG/ML IJ SOLN
150.0000 mg | Freq: Once | INTRAVENOUS | Status: AC
  Administered 2013-08-18: 150 mg via INTRAVENOUS
  Filled 2013-08-18: qty 3.75

## 2013-08-18 MED ORDER — OXYCODONE-ACETAMINOPHEN 5-325 MG PO TABS
1.0000 | ORAL_TABLET | ORAL | Status: DC | PRN
  Administered 2013-08-18 – 2013-08-20 (×5): 1 via ORAL
  Filled 2013-08-18 (×4): qty 1

## 2013-08-18 MED ORDER — SODIUM CHLORIDE 0.9 % IV SOLN
2.0000 g | Freq: Four times a day (QID) | INTRAVENOUS | Status: AC
  Administered 2013-08-18: 2 g via INTRAVENOUS
  Filled 2013-08-18: qty 2000

## 2013-08-18 MED ORDER — IBUPROFEN 600 MG PO TABS
600.0000 mg | ORAL_TABLET | Freq: Four times a day (QID) | ORAL | Status: DC
  Administered 2013-08-18 – 2013-08-20 (×8): 600 mg via ORAL
  Filled 2013-08-18 (×8): qty 1

## 2013-08-18 MED ORDER — DIPHENHYDRAMINE HCL 25 MG PO CAPS: 25.0000 mg | ORAL_CAPSULE | Freq: Four times a day (QID) | ORAL | Status: DC | PRN

## 2013-08-18 MED ORDER — DIBUCAINE 1 % RE OINT: 1.0000 "application " | TOPICAL_OINTMENT | RECTAL | Status: DC | PRN

## 2013-08-18 MED ORDER — GENTAMICIN SULFATE 40 MG/ML IJ SOLN
150.0000 mg | Freq: Three times a day (TID) | INTRAVENOUS | Status: DC
  Filled 2013-08-18 (×2): qty 3.75

## 2013-08-18 MED ORDER — SENNOSIDES-DOCUSATE SODIUM 8.6-50 MG PO TABS
2.0000 | ORAL_TABLET | ORAL | Status: DC
  Administered 2013-08-18 – 2013-08-20 (×2): 2 via ORAL

## 2013-08-18 MED ORDER — WITCH HAZEL-GLYCERIN EX PADS: 1.0000 "application " | MEDICATED_PAD | CUTANEOUS | Status: DC | PRN

## 2013-08-18 MED ORDER — ZOLPIDEM TARTRATE 5 MG PO TABS: 5.0000 mg | ORAL_TABLET | Freq: Every evening | ORAL | Status: DC | PRN

## 2013-08-18 MED ORDER — SODIUM CHLORIDE 0.9 % IV SOLN
2.0000 g | Freq: Four times a day (QID) | INTRAVENOUS | Status: DC
  Administered 2013-08-18: 2 g via INTRAVENOUS
  Filled 2013-08-18 (×3): qty 2000

## 2013-08-18 MED ORDER — BENZOCAINE-MENTHOL 20-0.5 % EX AERO
1.0000 "application " | INHALATION_SPRAY | CUTANEOUS | Status: DC | PRN
  Administered 2013-08-18: 1 via TOPICAL
  Filled 2013-08-18: qty 56

## 2013-08-18 MED ORDER — TETANUS-DIPHTH-ACELL PERTUSSIS 5-2.5-18.5 LF-MCG/0.5 IM SUSP: 0.5000 mL | Freq: Once | INTRAMUSCULAR | Status: DC

## 2013-08-18 MED ORDER — LANOLIN HYDROUS EX OINT: TOPICAL_OINTMENT | CUTANEOUS | Status: DC | PRN

## 2013-08-18 NOTE — Discharge Summary (Signed)
Attestation of Attending Supervision of Advanced Practitioner (CNM/NP): Evaluation and management procedures were performed by the Advanced Practitioner under my supervision and collaboration. I have reviewed the Advanced Practitioner's note and chart, and I agree with the management and plan.  Kayann Maj H. 7:55 AM   

## 2013-08-18 NOTE — Anesthesia Postprocedure Evaluation (Signed)
  Anesthesia Post-op Note  Patient: Jody Perez  Procedure(s) Performed: * No procedures listed *  Patient Location: PACU and Women's Unit  Anesthesia Type:Epidural  Level of Consciousness: awake, alert  and oriented  Airway and Oxygen Therapy: Patient Spontanous Breathing  Post-op Pain: mild  Post-op Assessment: Patient's Cardiovascular Status Stable, Respiratory Function Stable, No signs of Nausea or vomiting, Adequate PO intake, Pain level controlled, No headache, No backache, No residual numbness and No residual motor weakness  Post-op Vital Signs: stable  Complications: No apparent anesthesia complications

## 2013-08-18 NOTE — Progress Notes (Signed)
ANTIBIOTIC CONSULT NOTE - INITIAL  Pharmacy Consult for gentamicin Indication: maternal fever - R/O chorioamnionitis  No Known Allergies  Patient Measurements: Height: 5\' 4"  (162.6 cm) Weight: 171 lb (77.565 kg) IBW/kg (Calculated) : 54.7 Adjusted Body Weight: 62 kg  Vital Signs: Temp: 100.8 F (38.2 C) (09/22 0000) Temp src: Oral (09/21 2130) BP: 113/82 mmHg (09/22 0000) Pulse Rate: 120 (09/22 0000) Intake/Output from previous day: 09/21 0701 - 09/22 0700 In: -  Out: 250 [Urine:250] Intake/Output from this shift: Total I/O In: -  Out: 250 [Urine:250]  Labs:  Recent Labs  08/17/13 0920  WBC 10.9*  HGB 11.9*  PLT 279   Estimated Creatinine Clearance: 112.2 ml/min (by C-G formula based on Cr of 0.72).   Microbiology:   Medical History: Past Medical History  Diagnosis Date  . Chlamydia   . BV (bacterial vaginosis)   . Trichomonas   . UTI (lower urinary tract infection)   . HSV infection   . Herpes genitalis in women   . Miscarriage     Medications:  Scheduled:  . ampicillin (OMNIPEN) IV  2 g Intravenous Q6H  . ePHEDrine      . fentaNYL 2.5 mcg/ml w/bupivacaine 1/10% in NS epidural infusion ( total)      . valACYclovir  500 mg Oral BID   Infusions:  . fentaNYL 2.5 mcg/ml w/bupivacaine 1/10% in NS epidural infusion ( total) 14 mL/hr (08/17/13 2155)  . lactated ringers 125 mL/hr at 08/17/13 1829  . oxytocin 40 units in LR 1000 mL    . oxytocin 40 units in LR 1000 mL    . oxytocin 40 units in LR 1000 mL 2 milli-units/min (08/18/13 0015)   PRN: acetaminophen, citric acid-sodium citrate, diphenhydrAMINE, ePHEDrine, ePHEDrine, fentaNYL, fentaNYL 2.5 mcg/ml w/bupivacaine 1/10% in NS epidural infusion ( total), ibuprofen, lactated ringers, lidocaine (PF), ondansetron, oxyCODONE-acetaminophen, phenylephrine, phenylephrine  Assessment: 57yr G2P0010 SROM term pregnancy: received 4 doses PCN already for GBS + status; now has  fever.  Antibiotics changed to Ampicillin 2gm IV q6h and gentamicin.  Goal of Therapy:  Desire gentamicin peak serum level 6-73mcg/ml and trough <73mcg/ml.  Plan:  1. Gentamicin 170mg  IV x 1 loading dose, then 2. Gentamicin 150mg  IV q8h  Maintenance regimen (if tx continued) 3. Measure SCr to verify est CrCL 110-164ml/min 4.  Measure actual serum gentamicin levels if Tx >72hr or as clinically indicated  Scarlett Presto 08/18/2013,12:23 AM

## 2013-08-18 NOTE — Lactation Note (Addendum)
This note was copied from the chart of Jody Dali Vint. Lactation Consultation Note   Initial consult with this first time mom of a term NICU baby, initially to NICU after neonatal depression, resolved in DR, and with ABO incompatibility, under photoherapy. i started mom pumping with DEP, in premie setting, and reviewed both the lactation folder and the NICU book on EBM with mom, and dad. I showed mom how to hand express. She expressed only a tiny drop of colostrum from each breast. I told mom to have the baby's nurse call for me to help with latching. I will follow this family in the NICU. Mom is active with WIC, and will call for DEP. Mom knows to call for questions/concerns.  Patient Name: Jody Perez [id number] Date: 08/18/2013 Reason for consult: Initial assessment;NICU baby   Maternal Data Formula Feeding for Exclusion: Yes (baby in NICU - RDS, ABO incompatibility/phototherapy) Infant to breast within first hour of birth: No Breastfeeding delayed due to:: Infant status Has patient been taught Hand Expression?: Yes Does the patient have breastfeeding experience prior to this delivery?: No  Feeding Feeding Type: Formula Nipple Type: Regular Length of feed: 20 min  LATCH Score/Interventions                      Lactation Tools Discussed/Used Tools: Pump Breast pump type: Double-Electric Breast Pump WIC Program: Yes (mom knows to call for DEP) Pump Review: Setup, frequency, and cleaning;Milk Storage;Other (comment) (hand expresion/teaching on EBM/NICU) Initiated by:: c Rosenda Geffrard at 10 hours post partum Date initiated:: 08/18/13 (1345)   Consult Status Consult Status: Follow-up Date: 08/19/13 Follow-up type: In-patient    Alfred Levins 08/18/2013, 2:12 PM

## 2013-08-18 NOTE — Progress Notes (Signed)
I spoke with and examined patient and agree with resident's note and plan of care.  Tawana Scale, MD OB Fellow 08/18/2013 12:52 AM

## 2013-08-18 NOTE — Progress Notes (Signed)
Jody Perez is a 22 y.o. G2P0010 at [redacted]w[redacted]d admitted for active labor, rupture of membranes  Subjective: Pt trialled pushing for ~1hr with some success. Pt then febrile and started on Amp/gent  Objective: BP 108/57  Pulse 77  Temp(Src) 100.8 F (38.2 C) (Oral)  Resp 18  Ht 5\' 4"  (1.626 m)  Wt 77.565 kg (171 lb)  BMI 29.34 kg/m2  SpO2 100%  LMP 10/01/2012   Total I/O In: -  Out: 250 [Urine:250]  FHT:  FHR: 155 bpm, variability: moderate,  accelerations:  Abscent,  decelerations:  Present early UC:   regular, every 3-4 minutes SVE:   Dilation: 10 Effacement (%): 100 Station: +2 Exam by:: V.Mensah  Labs: Lab Results  Component Value Date   WBC 10.9* 08/17/2013   HGB 11.9* 08/17/2013   HCT 35.3* 08/17/2013   MCV 89.6 08/17/2013   PLT 279 08/17/2013    Assessment / Plan: Augmentation of labor, progressing well, holding pitocin for 1 hr due to tachysystole and then restart at lower dose.   Labor: Progressing normally, complete with some spacing of ctx. Will give small dose of pitocin to augment ctx pattern Preeclampsia:  no signs or symptoms of toxicity Fetal Wellbeing:  Category I Pain Control:  Epidural I/D:  Pt with Temp of 100.8/fetal high normal HR, started on amp/gent for presumed chorio Anticipated MOD:  NSVD  Jody Perez RYAN 08/18/2013, 12:44 AM

## 2013-08-19 DIAGNOSIS — O4290 Premature rupture of membranes, unspecified as to length of time between rupture and onset of labor, unspecified weeks of gestation: Secondary | ICD-10-CM

## 2013-08-19 DIAGNOSIS — O9989 Other specified diseases and conditions complicating pregnancy, childbirth and the puerperium: Secondary | ICD-10-CM

## 2013-08-19 NOTE — Progress Notes (Signed)
Post Partum Day #1  Subjective: no complaints, up ad lib, voiding and has had LC help with pumping. Baby stable  with bililights in NICY [place] in labor>abx d/c'd post delivery.  Objective: Blood pressure 103/50, pulse 66, temperature 97.8 F (36.6 C), temperature source Oral, resp. rate 16, height 5\' 4"  (1.626 m), weight 77.565 kg (171 lb), last menstrual period 10/01/2012, SpO2 100.00%, unknown if currently breastfeeding.  Physical Exam:  Filed Vitals:   08/19/13 0534  BP: 103/50  Pulse: 66  Temp: 97.8 F (36.6 C)  Resp: 16   General: appears stated age, fatigued and no distress Lochia: appropriate Uterine Fundus: firm Incision: N/A DVT Evaluation: No evidence of DVT seen on physical exam. Breasts soft  Recent Labs  08/17/13 0920  HGB 11.9*  HCT 35.3*    Assessment/Plan: Plan for discharge tomorrow   LOS: 2 days    Danae Orleans, CNM 08/19/2013 9:18 AM

## 2013-08-19 NOTE — Progress Notes (Signed)
CSW assessment completed.  Full documentation to follow. 

## 2013-08-19 NOTE — Progress Notes (Signed)
UR completed 

## 2013-08-19 NOTE — Lactation Note (Signed)
This note was copied from the chart of Jody Alanna Naef. Lactation Consultation Note  Brief consult with mom of a NICU baby, now 41 hours old, and is full term. Mom has not been pumping. I asked her if she had switched to formula feeding. She said yes because she was not expressing any milk, and was getting frustrated. I show3d mom that in the nICU book on breast feeding, it is normal for a first time mom to not express anything for the first 3 days, and that she should kep pumping and at least try breast feedigng once before deciding. She did try breast feeding her baby after this conversation, and as per Precious Haws, RN, both mom and baby id very well. I will follow up with mom tomorrow, and see if she again wants to provide breast milk.  Patient Name: Jody Perez [id number] Date: 08/19/2013 Reason for consult: Follow-up assessment;NICU baby   Maternal Data    Feeding Feeding Type: Breast Milk  LATCH Score/Interventions                      Lactation Tools Discussed/Used     Consult Status Consult Status: Follow-up Date: 08/20/13 Follow-up type: In-patient    Alfred Levins 08/19/2013, 3:19 PM

## 2013-08-20 MED ORDER — IBUPROFEN 600 MG PO TABS: 600.0000 mg | ORAL_TABLET | Freq: Four times a day (QID) | ORAL | Status: DC

## 2013-08-20 NOTE — Discharge Summary (Signed)
Obstetric Discharge Summary Reason for Admission: onset of labor and rupture of membranes Prenatal Procedures: none Intrapartum Procedures: spontaneous vaginal delivery and GBS prophylaxis Postpartum Procedures: none Complications-Operative and Postpartum: none Hemoglobin  Date Value Range Status  08/17/2013 11.9* 12.0 - 15.0 g/dL Final     HCT  Date Value Range Status  08/17/2013 35.3* 36.0 - 46.0 % Final   Ms Mayweather is a 22yo G2P1011 who presented to MAU on 9/21 at 39.2wks with contractions, and then SROM in MAU. She was admitted to L&D and was her labor was augmented with Pitocin. She progressed to SVD on 9/22. Infant was taken to the NICU for eval and treatment. Pt's postpartum stay was uneventful, and she has been deemed to have received the full benefit of her hospital stay and will be discharged home today.  Physical Exam:  General: alert, cooperative and no distress Lungs: nl effort Heart: RRR Lochia: appropriate Uterine Fundus: firm DVT Evaluation: No evidence of DVT seen on physical exam.  Discharge Diagnoses: Term Pregnancy-delivered  Discharge Information: Date: 08/20/2013 Activity: pelvic rest Diet: routine Medications: PNV and Ibuprofen Condition: stable Instructions: refer to practice specific booklet Discharge to: home Follow-up Information   Follow up with [facility]. (Make a postpartum appointment for 4-6 weeks.)    Contact information:   [address] [place] [address] [phone]      Newborn Data: Live born female  Birth Weight: 6 lb 2.1 oz (2780 g) APGAR: 3, 8  Infant in NICU. Pt pumping; desires Mirena for contraception.  Cam Hai 08/20/2013, 7:55 AM

## 2013-08-20 NOTE — Progress Notes (Signed)
Pt is discharged in the care of Mother. Discharged instructions were given to pt. States she understands all instuctions well. Questions were asked and answered.Infant to remain in [place]. Denies any pain or discomfort.

## 2013-08-21 NOTE — Progress Notes (Signed)
Clinical Social Work Department PSYCHOSOCIAL ASSESSMENT - MATERNAL/CHILD Late Entry from 08/19/2013  Patient:  Rufus,Anne  Account Number:  [id number]  Admit Date:  08/17/2013  Childs Name:   Atalyah Akridge    Clinical Social Worker:  Esparanza Krider, LCSW   Date/Time:  08/19/2013 05:00 PM  Date Referred:  08/19/2013   Referral source  RN     Referred reason  Other - See comment   Other referral source:   Referral for car seat.  Assessment completed due to NICU admission.    I:  FAMILY / HOME ENVIRONMENT Child's legal guardian:  PARENT  Guardian - Name Guardian - Age Guardian - Address  Daizy Hoard [address], [place], [place] [address]  Jonel Ritter  not involved   Other household support members/support persons Name Relationship DOB  Angelia Bishop MOTHER    Other support:   MOB states she has good supports    II  PSYCHOSOCIAL DATA Information Source:  Family Interview  Financial and Community Resources Employment:   MOB plans to look for a job once baby is old enough to go to daycare.   Financial resources:  Medicaid If Medicaid - County:  [place]  School / Grade:   Maternity Care Coordinator / Child Services Coordination / Early Interventions:   [facility]  Cultural issues impacting care:   None stated    III  STRENGTHS Strengths  Compliance with medical plan  Other - See comment  Supportive family/friends  Understanding of illness   Strength comment:  CSW gave MOB a pediatrician list and explained that she will need to choose a pediatrician prior to baby's discharge.   IV  RISK FACTORS AND CURRENT PROBLEMS Current Problem:  None   Risk Factor & Current Problem Patient Issue Family Issue Risk Factor / Current Problem Comment   N N     V  SOCIAL WORK ASSESSMENT CSW met with MOB and MGM in MOB's third floor room to introduce myself, complete assessment and evaluate how family is coping with baby's admission to NICU.  MOB was very pleasant, although  tearful when talking about baby having to be admitted to NICU.  She is hopeful that baby will not have to be in the hospital long and is interested in rooming in prior to baby's discharge.  She states she has good supports, and although she would like to have her own place, she can stay with her mother as long as she needs.  MGM confirmed.  MOB does not have supplies for baby at home.  She states she has a queen sized bed and plans to get a crib for baby as soon as possible.  CSW informed her of SIDS risks and offered a pack n play from [employer], urging her to use it and not cosleep with baby. MOB agreed and was appreciative.  She states she has limited clothes and diapers and CSW will make referral to [facility] for these items as well.  MOB states she needs a car seat because the one she has is missing a piece.  She has this car seat with her today.  It was manufactured in 2007 and therefore is expired.  CSW informed MOB of [employer] car seat program.  MOB states she can come up with $30.00 cash for the car seat.  CSW explained ongoing support services offered by NICU CSW and discussed PPD signs and symptoms.  CSW gave contact information and encouraged MOB to call anytime, especially if she   is concerned about her emotions.  CSW gave contact information.  MOB and MGM seemed very appreciative of CSW's visit and state no further questions or needs at this time. CSW is not aware of any barriers to discharge when baby is medically stable.      VI SOCIAL WORK PLAN Social Work Plan  Psychosocial Support/Ongoing Assessment of Needs   Type of pt/family education:   PPD signs and symptoms  [employer] programs  Ongoing support offered by NICU CSW   If child protective services report - county:   If child protective services report - date:   Information/referral to community resources comment:   [employer] car seat program  [employer] items    Other social work plan:    

## 2013-08-23 NOTE — Discharge Summary (Signed)
Attestation of Attending Supervision of Advanced Practitioner (CNM/NP): Evaluation and management procedures were performed by the Advanced Practitioner under my supervision and collaboration. I have reviewed the Advanced Practitioner's note and chart, and I agree with the management and plan.  Lenvil Swaim H. 5:41 PM   

## 2013-10-07 ENCOUNTER — Telehealth: Payer: Self-pay | Admitting: *Deleted

## 2013-10-07 DIAGNOSIS — B009 Herpesviral infection, unspecified: Secondary | ICD-10-CM

## 2013-10-07 MED ORDER — ACYCLOVIR 400 MG PO TABS: 400.0000 mg | ORAL_TABLET | Freq: Three times a day (TID) | ORAL | Status: DC

## 2013-10-07 NOTE — Telephone Encounter (Signed)
Spoke with patient, confirmed she is currently having an outbreak of HSV, delivered 08/18/2013.

## 2013-10-07 NOTE — Telephone Encounter (Signed)
Pt called nurse line request refill for acyclovir.

## 2013-10-22 ENCOUNTER — Ambulatory Visit (INDEPENDENT_AMBULATORY_CARE_PROVIDER_SITE_OTHER): Payer: Medicaid Other | Admitting: Family Medicine

## 2013-10-22 VITALS — BP 129/74 | HR 75 | Temp 97.1°F | Wt 152.4 lb

## 2013-10-22 DIAGNOSIS — IMO0001 Reserved for inherently not codable concepts without codable children: Secondary | ICD-10-CM

## 2013-10-22 DIAGNOSIS — Z3049 Encounter for surveillance of other contraceptives: Secondary | ICD-10-CM

## 2013-10-22 LAB — POCT PREGNANCY, URINE: Preg Test, Ur: NEGATIVE

## 2013-10-22 MED ORDER — MEDROXYPROGESTERONE ACETATE 104 MG/0.65ML ~~LOC~~ SUSP
104.0000 mg | Freq: Once | SUBCUTANEOUS | Status: AC
  Administered 2013-10-22: 104 mg via SUBCUTANEOUS

## 2013-10-22 MED ORDER — MEDROXYPROGESTERONE ACETATE 150 MG/ML IM SUSP: 150.0000 mg | INTRAMUSCULAR | Status: DC

## 2013-10-22 NOTE — Progress Notes (Signed)
Patient ID: Jody Perez, female   DOB: [birth year], 22 y.o.   MRN: [id number] Subjective:     Jody Perez is a 22 y.o. female who presents for a postpartum visit. She is 8 weeks postpartum following a spontaneous vaginal delivery. I have fully reviewed the prenatal and intrapartum course. The delivery was at 39+4 gestational weeks. Outcome: spontaneous vaginal delivery. Anesthesia: epidural. Postpartum course has been Uncomplicated. Baby's course has been uncomplicated. Baby is feeding by bottle - Jody Perez. Bleeding no bleeding. Bowel function is normal. Bladder function is normal. Patient is sexually active. Contraception method is Depo-Provera injections. Postpartum depression screening: negative.  The following portions of the patient's history were reviewed and updated as appropriate: allergies, current medications, past family history, past medical history, past social history, past surgical history and problem list.  Review of Systems Pertinent items are noted in HPI.   Objective:    BP 129/74  Pulse 75  Temp(Src) 97.1 F (36.2 C)  Wt 152 lb 6.4 oz (69.128 kg)  LMP 09/29/2013  Breastfeeding? No  General:  alert, cooperative, appears stated age and no distress   Breasts:  declined reported normal  Lungs: clear to auscultation bilaterally and normal percussion bilaterally  Heart:  regular rate and rhythm, S1, S2 normal, no murmur, click, rub or gallop and normal apical impulse  Abdomen: soft, non-tender; bowel sounds normal; no masses,  no organomegaly       No vaginal exam, no tears, no complaints Assessment:     Pt is doing well postpartum exam. Pap smear not done today by pt request, reports she will come back and have done at next depo shot visit.   Plan:    1. Contraception: Depo-Provera injections 2. Pap at next depo shot 3. Follow up in: 3 months or as needed.   May return to intercourse as able, if persistentnly painful will follow up as well.

## 2013-10-22 NOTE — Patient Instructions (Signed)

## 2013-12-05 ENCOUNTER — Other Ambulatory Visit (HOSPITAL_COMMUNITY)
Admission: RE | Admit: 2013-12-05 | Discharge: 2013-12-05 | Disposition: A | Discharge status: Home / Self Care | Payer: Medicaid Other | Source: Ambulatory Visit | Attending: Obstetrics & Gynecology | Admitting: Obstetrics & Gynecology

## 2013-12-05 ENCOUNTER — Encounter: Payer: Self-pay | Admitting: Nurse Practitioner

## 2013-12-05 ENCOUNTER — Ambulatory Visit (INDEPENDENT_AMBULATORY_CARE_PROVIDER_SITE_OTHER): Payer: Medicaid Other | Admitting: Nurse Practitioner

## 2013-12-05 VITALS — BP 122/75 | HR 70 | Temp 99.1°F | Ht 62.0 in

## 2013-12-05 DIAGNOSIS — Z113 Encounter for screening for infections with a predominantly sexual mode of transmission: Secondary | ICD-10-CM | POA: Insufficient documentation

## 2013-12-05 DIAGNOSIS — N898 Other specified noninflammatory disorders of vagina: Secondary | ICD-10-CM

## 2013-12-05 DIAGNOSIS — Z0000 Encounter for general adult medical examination without abnormal findings: Secondary | ICD-10-CM

## 2013-12-05 DIAGNOSIS — Z309 Encounter for contraceptive management, unspecified: Secondary | ICD-10-CM | POA: Insufficient documentation

## 2013-12-05 DIAGNOSIS — Z01419 Encounter for gynecological examination (general) (routine) without abnormal findings: Secondary | ICD-10-CM | POA: Insufficient documentation

## 2013-12-05 NOTE — Patient Instructions (Addendum)
Contraception Choices Contraception (birth control) is the use of any methods or devices to prevent pregnancy. Below are some methods to help avoid pregnancy. HORMONAL METHODS   Contraceptive implant This is a thin, plastic tube containing progesterone hormone. It does not contain estrogen hormone. Your health care provider inserts the tube in the inner part of the upper arm. The tube can remain in place for up to 3 years. After 3 years, the implant must be removed. The implant prevents the ovaries from releasing an egg (ovulation), thickens the cervical mucus to prevent sperm from entering the uterus, and thins the lining of the inside of the uterus.  Progesterone-only injections These injections are given every 3 months by your health care provider to prevent pregnancy. This synthetic progesterone hormone stops the ovaries from releasing eggs. It also thickens cervical mucus and changes the uterine lining. This makes it harder for sperm to survive in the uterus.  Birth control pills These pills contain estrogen and progesterone hormone. They work by preventing the ovaries from releasing eggs (ovulation). They also cause the cervical mucus to thicken, preventing the sperm from entering the uterus. Birth control pills are prescribed by a health care provider.Birth control pills can also be used to treat heavy periods.  Minipill This type of birth control pill contains only the progesterone hormone. They are taken every day of each month and must be prescribed by your health care provider.  Birth control patch The patch contains hormones similar to those in birth control pills. It must be changed once a week and is prescribed by a health care provider.  Vaginal ring The ring contains hormones similar to those in birth control pills. It is left in the vagina for 3 weeks, removed for 1 week, and then a new one is put back in place. The patient must be comfortable inserting and removing the ring from the  vagina.A health care provider's prescription is necessary.  Emergency contraception Emergency contraceptives prevent pregnancy after unprotected sexual intercourse. This pill can be taken right after sex or up to 5 days after unprotected sex. It is most effective the sooner you take the pills after having sexual intercourse. Most emergency contraceptive pills are available without a prescription. Check with your pharmacist. Do not use emergency contraception as your only form of birth control. BARRIER METHODS   Female condom This is a thin sheath (latex or rubber) that is worn over the penis during sexual intercourse. It can be used with spermicide to increase effectiveness.  Female condom. This is a soft, loose-fitting sheath that is put into the vagina before sexual intercourse.  Diaphragm This is a soft, latex, dome-shaped barrier that must be fitted by a health care provider. It is inserted into the vagina, along with a spermicidal jelly. It is inserted before intercourse. The diaphragm should be left in the vagina for 6 to 8 hours after intercourse.  Cervical cap This is a round, soft, latex or plastic cup that fits over the cervix and must be fitted by a health care provider. The cap can be left in place for up to 48 hours after intercourse.  Sponge This is a soft, circular piece of polyurethane foam. The sponge has spermicide in it. It is inserted into the vagina after wetting it and before sexual intercourse.  Spermicides These are chemicals that kill or block sperm from entering the cervix and uterus. They come in the form of creams, jellies, suppositories, foam, or tablets. They do not require a   prescription. They are inserted into the vagina with an applicator before having sexual intercourse. The process must be repeated every time you have sexual intercourse. INTRAUTERINE CONTRACEPTION  Intrauterine device (IUD) This is a T-shaped device that is put in a woman's uterus during a  menstrual period to prevent pregnancy. There are 2 types:  Copper IUD This type of IUD is wrapped in copper wire and is placed inside the uterus. Copper makes the uterus and fallopian tubes produce a fluid that kills sperm. It can stay in place for 10 years.  Hormone IUD This type of IUD contains the hormone progestin (synthetic progesterone). The hormone thickens the cervical mucus and prevents sperm from entering the uterus, and it also thins the uterine lining to prevent implantation of a fertilized egg. The hormone can weaken or kill the sperm that get into the uterus. It can stay in place for 3 5 years, depending on which type of IUD is used. PERMANENT METHODS OF CONTRACEPTION  Female tubal ligation This is when the woman's fallopian tubes are surgically sealed, tied, or blocked to prevent the egg from traveling to the uterus.  Hysteroscopic sterilization This involves placing a small coil or insert into each fallopian tube. Your doctor uses a technique called hysteroscopy to do the procedure. The device causes scar tissue to form. This results in permanent blockage of the fallopian tubes, so the sperm cannot fertilize the egg. It takes about 3 months after the procedure for the tubes to become blocked. You must use another form of birth control for these 3 months.  Female sterilization This is when the female has the tubes that carry sperm tied off (vasectomy).This blocks sperm from entering the vagina during sexual intercourse. After the procedure, the man can still ejaculate fluid (semen). NATURAL PLANNING METHODS  Natural family planning This is not having sexual intercourse or using a barrier method (condom, diaphragm, cervical cap) on days the woman could become pregnant.  Calendar method This is keeping track of the length of each menstrual cycle and identifying when you are fertile.  Ovulation method This is avoiding sexual intercourse during ovulation.  Symptothermal method This is  avoiding sexual intercourse during ovulation, using a thermometer and ovulation symptoms.  Post ovulation method This is timing sexual intercourse after you have ovulated. Regardless of which type or method of contraception you choose, it is important that you use condoms to protect against the transmission of sexually transmitted infections (STIs). Talk with your health care provider about which form of contraception is most appropriate for you. Document Released: 11/13/2005 Document Revised: 07/16/2013 Document Reviewed: 05/08/2013 Exit[employer] Patient Information 2014 [employer].  

## 2013-12-05 NOTE — Progress Notes (Signed)
History:  Minette HeadlandKyata Koren is a 23 y.o. G2P1011 who presents to clinic today for annual exam.  Patient has no complaints.  Baby delivered on Sept 22nd SVD with no problems or complications through pregnancy, labor, or delivery per patient.  She was restarted on Depo for contraceptive use - next shot is Feb 18th.  Has used this method for 4 1/2 years (prior to becoming pregnant) with no problems.  Describes adequate calcium intake.  LMP in November - patient unsure of exact day.  She reports she is not sexually active at this time.  PMH of HSV - takes acyclovir for outbreaks.  Patient would like PAP smear and STD testing today.  The following portions of the patient's history were reviewed and updated as appropriate: allergies, current medications, past family history, past medical history, past social history, past surgical history and problem list.  Review of Systems:  Pertinent items are noted in HPI.  Objective:  Physical Exam BP 122/75  Pulse 70  Temp(Src) 99.1 F (37.3 C) (Oral)  Ht 5\' 2"  (1.575 m)  Breastfeeding? No GENERAL: Well-developed, well-nourished female in no acute distress.  HEENT: Normocephalic, atraumatic.  LUNGS: Normal rate. Clear to auscultation bilaterally.  HEART: Regular rate and rhythm with no adventitious sounds.  ABDOMEN: Soft, nontender, nondistended. No organomegaly. Normal bowel sounds appreciated in all quadrants.  PELVIC: Normal external female genitalia. Vagina is pink and rugated.  Normal discharge. Normal cervix contour. Pap smear obtained. Uterus is normal in size. No adnexal mass or tenderness.  EXTREMITIES: No cyanosis, clubbing, or edema, 2+ distal pulses. PSYCH:  Normal mood.  Appropriate judgment. MSK:  Grossly intact.  Labs and Imaging No results found.  Assessment & Plan:  Assessment: Annual Exam Contraception  Plans: Performed PAP, wet prep, and GC.  Will call patient with results.   Continue with Depo.  Patient education provided on  increasing calcium intake.  Next shot scheduled for 2/18. RTC in one year or sooner if any problems arise.   Charlett LangoBritton Livingston Tucker, PA-S2  Delbert PhenixLinda M Maninder Deboer, NP 12/05/2013 12:26 PM

## 2013-12-06 LAB — WET PREP, GENITAL
Clue Cells Wet Prep HPF POC: NONE SEEN
Trich, Wet Prep: NONE SEEN
Yeast Wet Prep HPF POC: NONE SEEN

## 2013-12-22 ENCOUNTER — Telehealth: Payer: Self-pay | Admitting: General Practice

## 2013-12-22 DIAGNOSIS — B009 Herpesviral infection, unspecified: Secondary | ICD-10-CM

## 2013-12-22 MED ORDER — ACYCLOVIR 400 MG PO TABS: 400.0000 mg | ORAL_TABLET | Freq: Three times a day (TID) | ORAL | Status: DC

## 2013-12-22 MED ORDER — ACYCLOVIR 400 MG PO TABS: 400.0000 mg | ORAL_TABLET | Freq: Two times a day (BID) | ORAL | Status: DC

## 2013-12-22 NOTE — Telephone Encounter (Signed)
Patient called and left message stating she would like a refill on her acyclovir.

## 2013-12-22 NOTE — Telephone Encounter (Signed)
Called patient stating I was returning her phone call, patient states she takes the acyclovir during active outbreaks and gets 4-5 outbreaks a year. Spoke to Dr Reola Calkinsbeck who ordered the patient to take acyclovir 400mg  TID for 10 days during active outbreaks and acyclovir 400mg  BID daily when not having an active outbreak. Discussed changes in Rx with patient. Patient verbalized understanding. Meds ordered and sent to pharmacy. Patient had no further questions

## 2014-01-14 ENCOUNTER — Ambulatory Visit (INDEPENDENT_AMBULATORY_CARE_PROVIDER_SITE_OTHER): Payer: Medicaid Other

## 2014-01-14 VITALS — BP 139/79 | HR 81 | Wt 159.0 lb

## 2014-01-14 DIAGNOSIS — Z3049 Encounter for surveillance of other contraceptives: Secondary | ICD-10-CM

## 2014-01-14 MED ORDER — MEDROXYPROGESTERONE ACETATE 104 MG/0.65ML ~~LOC~~ SUSP
104.0000 mg | Freq: Once | SUBCUTANEOUS | Status: AC
  Administered 2014-01-14: 104 mg via SUBCUTANEOUS

## 2014-03-01 ENCOUNTER — Emergency Department (HOSPITAL_COMMUNITY)
Admission: EM | Admit: 2014-03-01 | Discharge: 2014-03-01 | Disposition: A | Discharge status: Home / Self Care | Payer: No Typology Code available for payment source | Attending: Emergency Medicine | Admitting: Emergency Medicine

## 2014-03-01 ENCOUNTER — Encounter (HOSPITAL_COMMUNITY): Payer: Self-pay | Admitting: Emergency Medicine

## 2014-03-01 DIAGNOSIS — Y92410 Unspecified street and highway as the place of occurrence of the external cause: Secondary | ICD-10-CM | POA: Insufficient documentation

## 2014-03-01 DIAGNOSIS — Z8619 Personal history of other infectious and parasitic diseases: Secondary | ICD-10-CM | POA: Insufficient documentation

## 2014-03-01 DIAGNOSIS — IMO0002 Reserved for concepts with insufficient information to code with codable children: Secondary | ICD-10-CM | POA: Insufficient documentation

## 2014-03-01 DIAGNOSIS — M549 Dorsalgia, unspecified: Secondary | ICD-10-CM

## 2014-03-01 DIAGNOSIS — Z87440 Personal history of urinary (tract) infections: Secondary | ICD-10-CM | POA: Insufficient documentation

## 2014-03-01 DIAGNOSIS — Z8742 Personal history of other diseases of the female genital tract: Secondary | ICD-10-CM | POA: Insufficient documentation

## 2014-03-01 DIAGNOSIS — Y9389 Activity, other specified: Secondary | ICD-10-CM | POA: Insufficient documentation

## 2014-03-01 MED ORDER — IBUPROFEN 400 MG PO TABS
800.0000 mg | ORAL_TABLET | Freq: Once | ORAL | Status: AC
  Administered 2014-03-01: 800 mg via ORAL
  Filled 2014-03-01: qty 2

## 2014-03-01 MED ORDER — METHOCARBAMOL 500 MG PO TABS: 500.0000 mg | ORAL_TABLET | Freq: Two times a day (BID) | ORAL | Status: DC

## 2014-03-01 MED ORDER — DICLOFENAC SODIUM 50 MG PO TBEC: 50.0000 mg | DELAYED_RELEASE_TABLET | Freq: Two times a day (BID) | ORAL | Status: DC
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
## 2014-03-03 NOTE — ED Provider Notes (Signed)
Medical screening examination/treatment/procedure(s) were performed by non-physician practitioner and as supervising physician I was immediately available for consultation/collaboration.     Laken Lobato M Tamiah Dysart, MD 03/03/14 1435 

## 2014-04-07 ENCOUNTER — Ambulatory Visit: Payer: Medicaid Other

## 2014-06-08 ENCOUNTER — Encounter (HOSPITAL_COMMUNITY): Payer: Self-pay | Admitting: Emergency Medicine

## 2014-06-08 ENCOUNTER — Other Ambulatory Visit (HOSPITAL_COMMUNITY)
Admission: RE | Admit: 2014-06-08 | Discharge: 2014-06-08 | Disposition: A | Discharge status: Home / Self Care | Payer: Medicaid Other | Source: Ambulatory Visit | Attending: Emergency Medicine | Admitting: Emergency Medicine

## 2014-06-08 ENCOUNTER — Emergency Department (HOSPITAL_COMMUNITY)
Admission: EM | Admit: 2014-06-08 | Discharge: 2014-06-08 | Disposition: A | Discharge status: Home / Self Care | Payer: Medicaid Other | Source: Home / Self Care | Attending: Emergency Medicine | Admitting: Emergency Medicine

## 2014-06-08 DIAGNOSIS — N762 Acute vulvitis: Secondary | ICD-10-CM

## 2014-06-08 DIAGNOSIS — N760 Acute vaginitis: Secondary | ICD-10-CM | POA: Diagnosis present

## 2014-06-08 DIAGNOSIS — Z113 Encounter for screening for infections with a predominantly sexual mode of transmission: Secondary | ICD-10-CM | POA: Insufficient documentation

## 2014-06-08 LAB — POCT URINALYSIS DIP (DEVICE)
Bilirubin Urine: NEGATIVE
Glucose, UA: NEGATIVE mg/dL
Ketones, ur: NEGATIVE mg/dL
NITRITE: NEGATIVE
Protein, ur: NEGATIVE mg/dL
Specific Gravity, Urine: 1.02 (ref 1.005–1.030)
UROBILINOGEN UA: 0.2 mg/dL (ref 0.0–1.0)
pH: 7 (ref 5.0–8.0)

## 2014-06-08 LAB — POCT PREGNANCY, URINE: Preg Test, Ur: NEGATIVE

## 2014-06-08 MED ORDER — FLUCONAZOLE 150 MG PO TABS
150.0000 mg | ORAL_TABLET | Freq: Once | ORAL | Status: DC
Start: 2014-06-08 — End: 2015-04-03

## 2014-06-08 MED ORDER — CEPHALEXIN 500 MG PO CAPS: 500.0000 mg | ORAL_CAPSULE | Freq: Three times a day (TID) | ORAL | Status: DC

## 2014-06-08 MED ORDER — METRONIDAZOLE 500 MG PO TABS: 500.0000 mg | ORAL_TABLET | Freq: Two times a day (BID) | ORAL | Status: DC

## 2014-06-08 NOTE — Discharge Instructions (Signed)

## 2014-06-08 NOTE — ED Notes (Signed)
states she just finished her menses , and for past 3 days, she "hasn't felt right" NAD

## 2014-06-08 NOTE — ED Provider Notes (Signed)
Chief Complaint   Chief Complaint  Patient presents with  . Vaginitis    History of Present Illness   Jody Perez is a 23 year old female who has had a one-week history of irritation of the vulva bilaterally with redness and swelling. She denies any skin lesions. She's had no vaginal discharge, itching, or odor. Her menses have been irregular for years. Last menstrual period began about a week ago. She has a history of bacterial vaginosis and yeast infection. She is sexually active without use of birth control.  Review of Systems   Other than as noted above, the patient denies any of the following symptoms: Systemic:  No fever or chills GI:  No abdominal pain, nausea, vomiting, diarrhea, constipation, melena or hematochezia. GU:  No dysuria, frequency, urgency, hematuria, vaginal discharge, itching, or abnormal vaginal bleeding.  PMFSH   Past medical history, family history, social history, meds, and allergies were reviewed.    Physical Examination    Vital signs:  BP 132/79  Pulse 95  Temp(Src) 98.1 F (36.7 C) (Oral)  SpO2 100% General:  Alert, oriented and in no distress. Lungs:  Breath sounds clear and equal bilaterally.  No wheezes, rales or rhonchi. Heart:  Regular rhythm.  No gallops or murmers. Abdomen:  Soft, flat and non-distended.  No organomegaly or mass.  No tenderness, guarding or rebound.  Bowel sounds normally active. Pelvic exam:  There is moderate erythema and tenderness to palpation of the labia majora bilaterally. There no ulcerations or lesions. Speculum exam reveals no discharge, normal vaginal and cervical mucosa, no bleeding. No pain on movement of the cervix. Uterus was midposition and normal in size and shape. No adnexal masses or tenderness.  DNA probes for gonorrhea, Chlamydia, Trichomonas, Gardnerella, Candida were obtained. Skin:  Clear, warm and dry.  Chaperoned by Michiel Cowboy, RN who was present throughout the pelvic exam.   Labs   Results  for orders placed during the hospital encounter of 06/08/14  POCT URINALYSIS DIP (DEVICE)      Result Value Ref Range   Glucose, UA NEGATIVE  NEGATIVE mg/dL   Bilirubin Urine NEGATIVE  NEGATIVE   Ketones, ur NEGATIVE  NEGATIVE mg/dL   Specific Gravity, Urine 1.020  1.005 - 1.030   Hgb urine dipstick TRACE (*) NEGATIVE   pH 7.0  5.0 - 8.0   Protein, ur NEGATIVE  NEGATIVE mg/dL   Urobilinogen, UA 0.2  0.0 - 1.0 mg/dL   Nitrite NEGATIVE  NEGATIVE   Leukocytes, UA TRACE (*) NEGATIVE  POCT PREGNANCY, URINE      Result Value Ref Range   Preg Test, Ur NEGATIVE  NEGATIVE    Assessment   The encounter diagnosis was Vulvitis.  No evidence for HSV. This could be Candida or bacterial infection. Since she has a history of bacterial vaginosis, I will go ahead and treat for this as well.       Plan    1.  Meds:  The following meds were prescribed:   Discharge Medication List as of 06/08/2014 12:16 PM    START taking these medications   Details  cephALEXin (KEFLEX) 500 MG capsule Take 1 capsule (500 mg total) by mouth 3 (three) times daily., Starting 06/08/2014, Until Discontinued, Normal    fluconazole (DIFLUCAN) 150 MG tablet Take 1 tablet (150 mg total) by mouth once., Starting 06/08/2014, Normal    metroNIDAZOLE (FLAGYL) 500 MG tablet Take 1 tablet (500 mg total) by mouth 2 (two) times daily., Starting 06/08/2014, Until Discontinued, Normal  2.  Patient Education/Counseling:  The patient was given appropriate handouts, self care instructions, and instructed in symptomatic relief.    3.  Follow up:  The patient was told to follow up here if no better in 3 to 4 days, or sooner if becoming worse in any way, and given some red flag symptoms such as worsening pain, fever, persistent vomiting, or heavy vaginal bleeding which would prompt immediate return.       Reuben Likesavid C Anastasha Ortez, MD 06/08/14 [phone]

## 2014-06-09 NOTE — Progress Notes (Signed)
Quick Note:  Results are abnormal as noted, but have been adequately treated. No further action necessary. ______ 

## 2014-06-10 LAB — HERPES SIMPLEX VIRUS CULTURE: Culture: NOT DETECTED

## 2014-06-10 NOTE — ED Notes (Signed)
GC/chlamydia neg., Affirm: Candida and Trich neg., Gardnerella pos., Herpes culture: No herpes simplex detected.  Pt. adequately treated Flagyl. Vassie MoselleYork, Zen Cedillos M 06/10/2014

## 2014-06-15 ENCOUNTER — Ambulatory Visit: Payer: Medicaid Other | Admitting: Obstetrics & Gynecology

## 2014-06-15 ENCOUNTER — Telehealth: Payer: Self-pay | Admitting: General Practice

## 2014-06-15 NOTE — Telephone Encounter (Signed)
Patient no showed for appt today. Called patient and she states she would like this appt rescheduled. Told patient our front office staff will call you with a new appt. Patient verbalized understanding and had no questions

## 2014-09-28 ENCOUNTER — Encounter (HOSPITAL_COMMUNITY): Payer: Self-pay | Admitting: Emergency Medicine

## 2014-10-07 ENCOUNTER — Emergency Department: Payer: Self-pay | Admitting: Emergency Medicine

## 2015-01-06 ENCOUNTER — Emergency Department: Payer: Self-pay | Admitting: Emergency Medicine

## 2015-01-26 IMAGING — US US OB COMP LESS 14 WK
1 series · 13 of 28 positions shown · non-contrast
Comparison: None.

CLINICAL DATA: Right pelvic pain for 3 weeks

EXAM:
OBSTETRIC <14 WK US AND TRANSVAGINAL OB US
TECHNIQUE: Both transabdominal and transvaginal ultrasound examinations were
performed for complete evaluation of the gestation as well as the
maternal uterus, adnexal regions, and pelvic cul-de-sac.
Transvaginal technique was performed to assess early pregnancy.

[Series 1: us ob comp less 14 wk · 0.15mm/px · 13 of 107 slices shown]
[im 4/107]
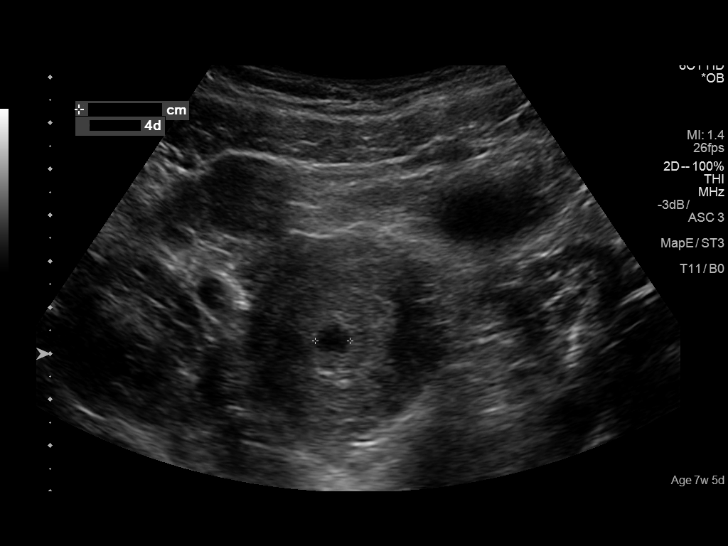
[im 12/107]
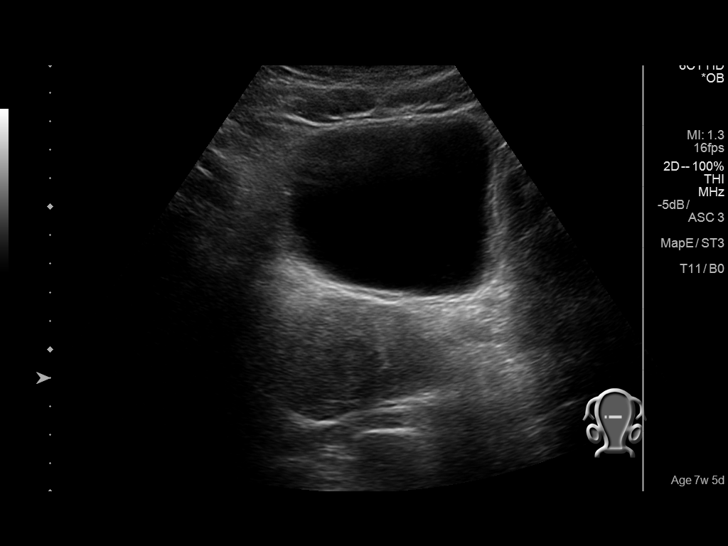
[im 20/107]
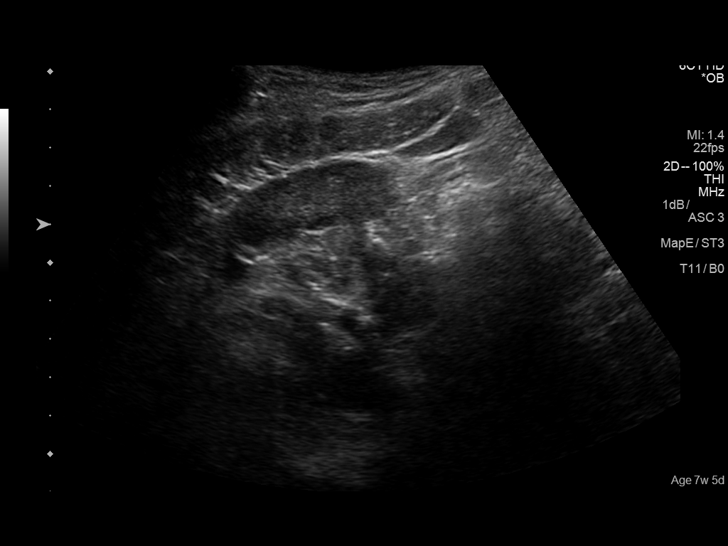
[im 28/107]
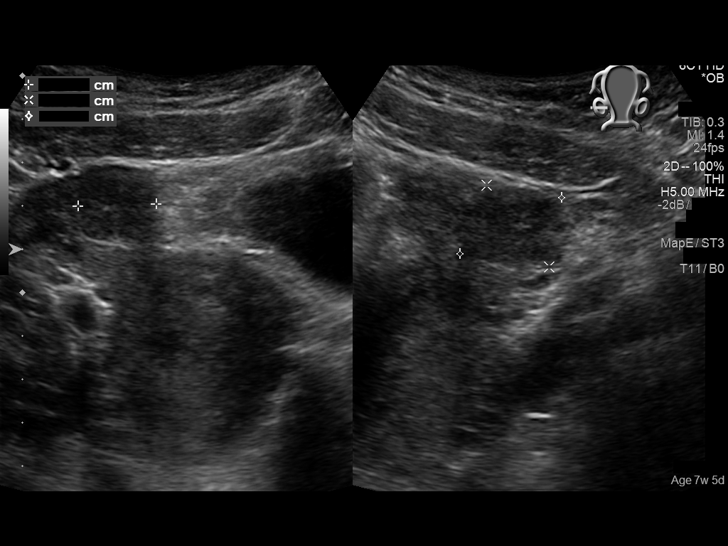
[im 36/107]
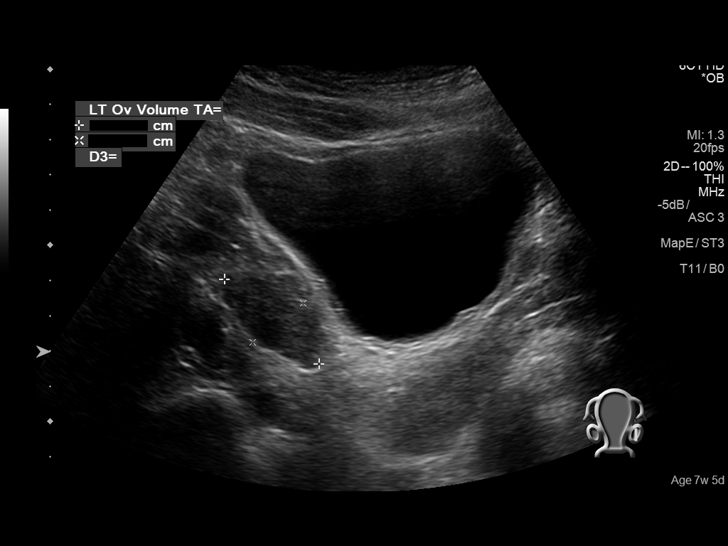
[im 44/107]
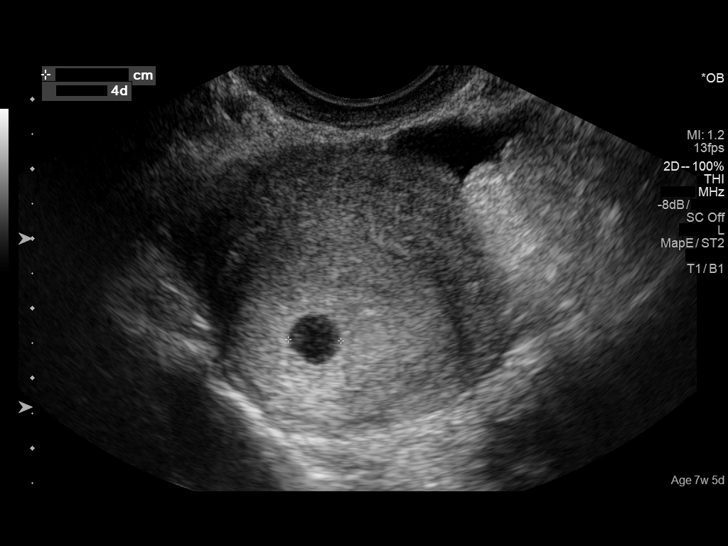
[im 55/107]
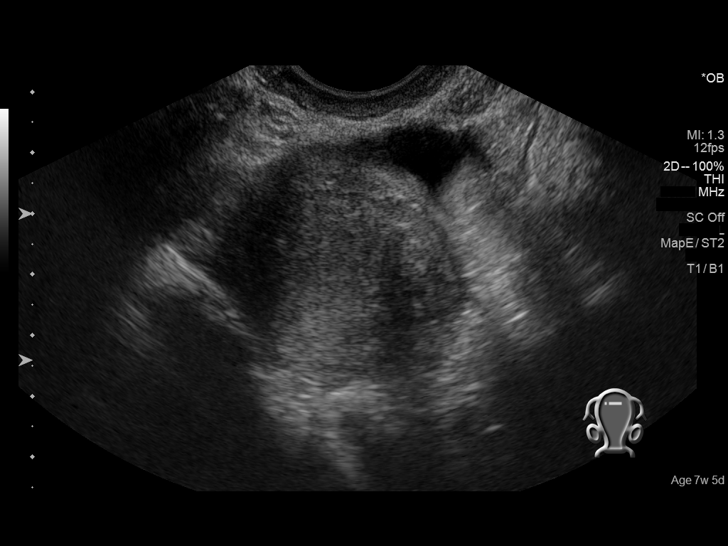
[im 63/107]
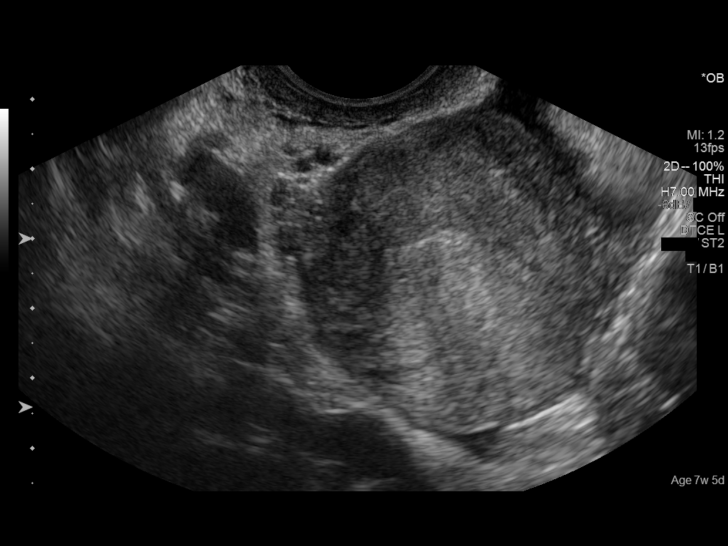
[im 71/107]
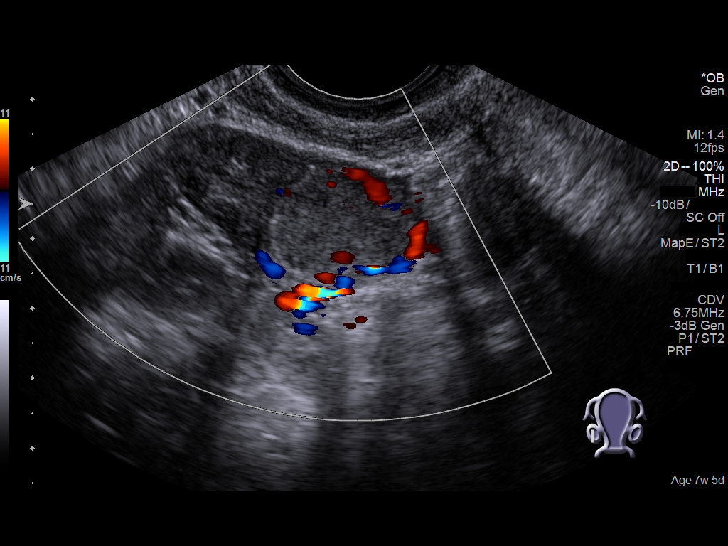
[im 79/107]
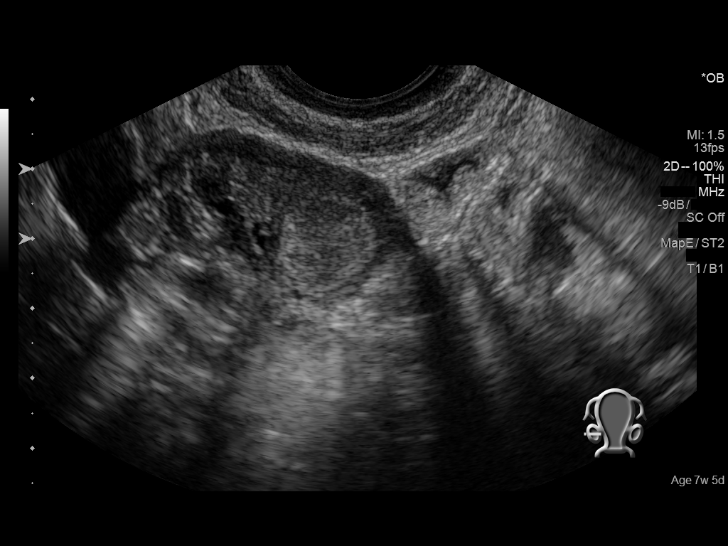
[im 87/107]
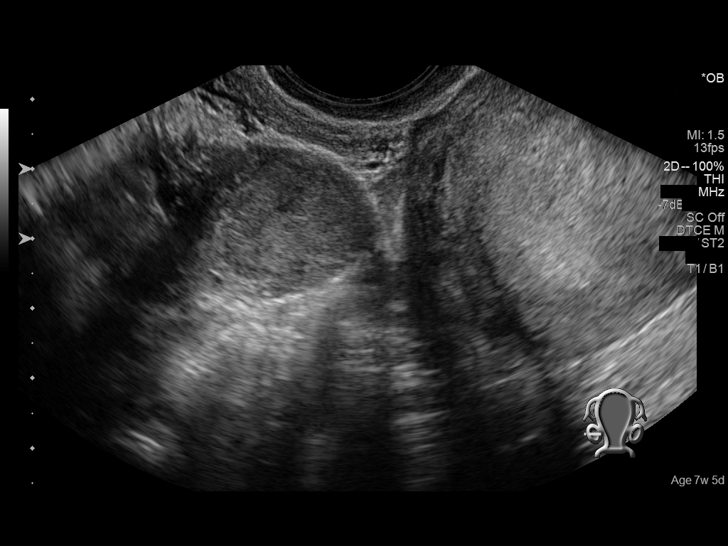
[im 95/107]
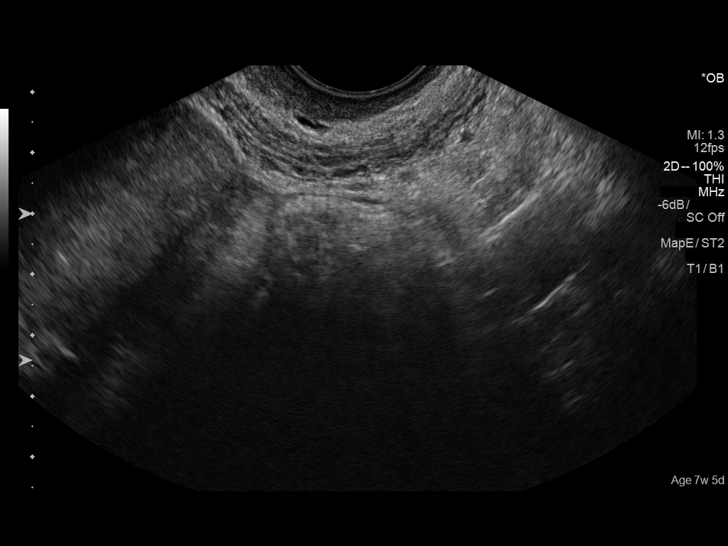
[im 103/107]
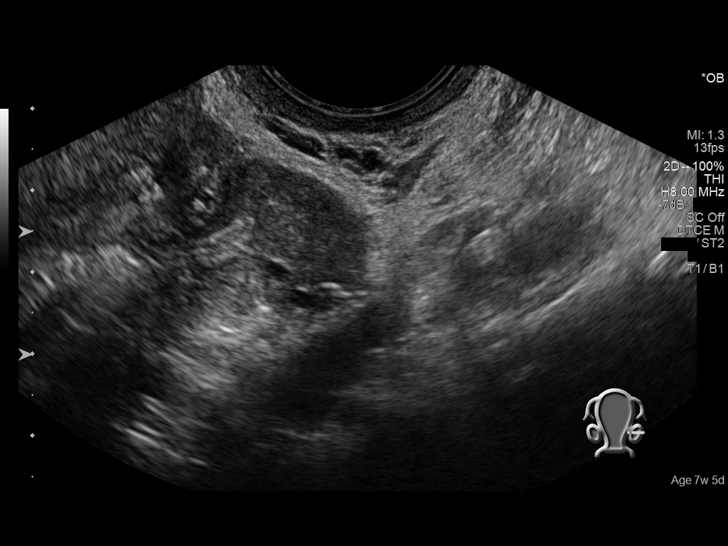

[13 of 28 positions shown; findings below may reference images not displayed]

FINDINGS: Intrauterine gestational sac: Single

Yolk sac:  No

Embryo:  None

Cardiac Activity: No

Heart Rate:   bpm

MSD: 7  mm   5 w   3  d

CRL:    mm    w    d                  US EDC:

Subchorionic hemorrhage:  None visualized.

Maternal uterus/adnexae: 2.6 cm complex abnormality in the right
adnexal region, probably a right ovarian corpus luteal cyst but not
conclusively characterized. Normal left ovary. Small volume free
pelvic fluid.
IMPRESSION: Small intrauterine sac is visible, without identification of a yolk
sac or fetal parts. The complex right ovarian abnormality is most
likely a corpus luteal cyst, but ectopic gestation is not entirely
excluded. Recommend follow-up quantitative B-HCG levels and
follow-up US in 14 days to confirm and assess viability. This
recommendation follows SRU consensus guidelines: Diagnostic Criteria
for Nonviable Pregnancy Early in the First Trimester. N Engl J Med

## 2015-03-03 ENCOUNTER — Emergency Department
Admit: 2015-03-03 | Disposition: A | Discharge status: Home / Self Care | Payer: Self-pay | Admitting: Emergency Medicine

## 2015-03-03 LAB — URINALYSIS, COMPLETE
BACTERIA: NONE SEEN
Bilirubin,UR: NEGATIVE
Blood: NEGATIVE
GLUCOSE, UR: NEGATIVE mg/dL (ref 0–75)
Ketone: NEGATIVE
Nitrite: NEGATIVE
Ph: 5 (ref 4.5–8.0)
Protein: 30
RBC,UR: 2 /HPF (ref 0–5)
Specific Gravity: 1.029 (ref 1.003–1.030)
Squamous Epithelial: 24
WBC UR: 25 /HPF (ref 0–5)

## 2015-03-03 LAB — GC/CHLAMYDIA PROBE AMP

## 2015-03-03 LAB — PREGNANCY, URINE: PREGNANCY TEST, URINE: NEGATIVE m[IU]/mL

## 2015-04-03 ENCOUNTER — Emergency Department (INDEPENDENT_AMBULATORY_CARE_PROVIDER_SITE_OTHER)
Admission: EM | Admit: 2015-04-03 | Discharge: 2015-04-03 | Disposition: A | Discharge status: Home / Self Care | Payer: Medicaid Other | Source: Home / Self Care | Attending: Family Medicine | Admitting: Family Medicine

## 2015-04-03 ENCOUNTER — Encounter (HOSPITAL_COMMUNITY): Payer: Self-pay | Admitting: Emergency Medicine

## 2015-04-03 ENCOUNTER — Other Ambulatory Visit (HOSPITAL_COMMUNITY)
Admission: RE | Admit: 2015-04-03 | Discharge: 2015-04-03 | Disposition: A | Discharge status: Home / Self Care | Payer: Medicaid Other | Source: Ambulatory Visit | Attending: Physician Assistant | Admitting: Physician Assistant

## 2015-04-03 DIAGNOSIS — N760 Acute vaginitis: Secondary | ICD-10-CM | POA: Diagnosis present

## 2015-04-03 DIAGNOSIS — Z113 Encounter for screening for infections with a predominantly sexual mode of transmission: Secondary | ICD-10-CM | POA: Diagnosis present

## 2015-04-03 DIAGNOSIS — B373 Candidiasis of vulva and vagina: Secondary | ICD-10-CM

## 2015-04-03 DIAGNOSIS — N898 Other specified noninflammatory disorders of vagina: Secondary | ICD-10-CM | POA: Diagnosis not present

## 2015-04-03 DIAGNOSIS — B3731 Acute candidiasis of vulva and vagina: Secondary | ICD-10-CM

## 2015-04-03 LAB — POCT URINALYSIS DIP (DEVICE)
Bilirubin Urine: NEGATIVE
Glucose, UA: NEGATIVE mg/dL
Hgb urine dipstick: NEGATIVE
Ketones, ur: NEGATIVE mg/dL
Nitrite: NEGATIVE
PH: 7.5 (ref 5.0–8.0)
PROTEIN: NEGATIVE mg/dL
Specific Gravity, Urine: 1.02 (ref 1.005–1.030)
UROBILINOGEN UA: 0.2 mg/dL (ref 0.0–1.0)

## 2015-04-03 LAB — POCT PREGNANCY, URINE: Preg Test, Ur: NEGATIVE

## 2015-04-03 MED ORDER — FLUCONAZOLE 150 MG PO TABS: 150.0000 mg | ORAL_TABLET | Freq: Once | ORAL | Status: DC

## 2015-04-03 NOTE — ED Notes (Signed)
C/o white vag d/c onset 2 days; hx of BV Denies urinary sx, abd pain, f/n/v/d Alert, no signs of acute distress

## 2015-04-03 NOTE — ED Provider Notes (Signed)
CSN: [id number]     Arrival date & time 04/03/15  1231 History   First MD Initiated Contact with Patient 04/03/15 1308     Chief Complaint  Patient presents with  . Vaginal Discharge    Patient is a 24 y.o. female presenting with vaginal discharge. The history is provided by the patient.  Vaginal Discharge Quality:  Thick, white and milky Severity:  Moderate Onset quality:  Gradual Progression:  Worsening Chronicity:  Recurrent Relieved by:  Nothing Worsened by:  Nothing tried Associated symptoms: vaginal itching   Associated symptoms: no abdominal pain, no dysuria, no fever, no nausea, no rash, no urinary frequency, no urinary hesitancy, no urinary incontinence and no vomiting   Risk factors: no PID, no STI and no STI exposure     Past Medical History  Diagnosis Date  . Chlamydia   . BV (bacterial vaginosis)   . Trichomonas   . UTI (lower urinary tract infection)   . HSV infection   . Herpes genitalis in women   . Miscarriage    Past Surgical History  Procedure Laterality Date  . No past surgeries     Family History  Problem Relation Age of Onset  . Hypertension Mother   . Hypertension Brother    History  Substance Use Topics  . Smoking status: Never Smoker   . Smokeless tobacco: Never Used  . Alcohol Use: No   OB History    Gravida Para Term Preterm AB TAB SAB Ectopic Multiple Living   2 1 1  1  1   1      Review of Systems  Constitutional: Negative for fever.  Respiratory: Negative for shortness of breath.   Gastrointestinal: Negative for nausea, vomiting and abdominal pain.  Genitourinary: Positive for vaginal discharge. Negative for bladder incontinence, dysuria and hesitancy.  Musculoskeletal: Negative for myalgias and arthralgias.  Neurological: Negative for dizziness.  All other systems reviewed and are negative.   Allergies  Review of patient's allergies indicates no known allergies.  Home Medications   Prior to Admission medications     Medication Sig Start Date End Date Taking? Authorizing Provider  diclofenac (VOLTAREN) 50 MG EC tablet Take 1 tablet (50 mg total) by mouth 2 (two) times daily. 03/01/14   Jennifer Piepenbrink, PA-C  fluconazole (DIFLUCAN) 150 MG tablet Take 1 tablet (150 mg total) by mouth once. Repeat if needed 04/03/15   Ofilia NeasMichael L Rosette Bellavance, PA-C  ibuprofen (ADVIL,MOTRIN) 200 MG tablet Take 200 mg by mouth every 6 (six) hours as needed for headache.    Historical Provider, MD  methocarbamol (ROBAXIN) 500 MG tablet Take 1 tablet (500 mg total) by mouth 2 (two) times daily. 03/01/14   Jennifer Piepenbrink, PA-C   BP 110/71 mmHg  Pulse 100  Temp(Src) 98.1 F (36.7 C) (Oral)  Resp 16  SpO2 98%  LMP 02/01/2015  Breastfeeding? No Physical Exam  Constitutional: She is oriented to person, place, and time. Vital signs are normal.  Cardiovascular: Normal rate.   Pulmonary/Chest: Effort normal.  Abdominal: Soft. There is no tenderness.  Genitourinary: There is no rash, tenderness, lesion or injury on the right labia. There is no rash, tenderness, lesion or injury on the left labia. Cervix exhibits no motion tenderness, no discharge and no friability. Right adnexum displays no mass, no tenderness and no fullness. Left adnexum displays no mass, no tenderness and no fullness. No erythema, tenderness or bleeding in the vagina. No foreign body around the vagina. No signs of injury around the  vagina. No vaginal discharge found.    Musculoskeletal: Normal range of motion.  Neurological: She is alert and oriented to person, place, and time.  Skin: Skin is warm and dry.  Psychiatric: She has a normal mood and affect.  Nursing note and vitals reviewed.   ED Course  Pelvic exam Date/Time: 04/03/2015 1:38 PM Performed by: Ofilia NeasLARK, Rosalba Totty L Authorized by: Ofilia NeasLARK, Primitivo Merkey L   Labs Review Labs Reviewed  POCT URINALYSIS DIP (DEVICE) - Abnormal; Notable for the following:    Leukocytes, UA LARGE (*)    All other components within  normal limits  HIV ANTIBODY (ROUTINE TESTING)  POCT PREGNANCY, URINE  CERVICOVAGINAL ANCILLARY ONLY    Imaging Review No results found.   MDM   1. Vaginal discharge   2. Yeast vaginitis    Patient with thick copious discharge on pelvic.  Negative for strawberry cervix.  Negative for odor.  Will treat for yeast vaginitis. GC/chlamydia, HIV, RPR, tich, and BV pending.  Will treat per labs. Hematuria likely secondary to yeast vaginitis. Deliah BostonMichael Suzanna Zahn, MS, PA-C   1:41 PM, 04/03/2015      Ofilia NeasMichael L Brylin Stopper, PA-C 04/03/15 1345

## 2015-04-03 NOTE — ED Notes (Signed)
Call back verified.

## 2015-04-04 LAB — HIV ANTIBODY (ROUTINE TESTING W REFLEX): HIV Screen 4th Generation wRfx: NONREACTIVE

## 2015-04-04 NOTE — ED Notes (Signed)
HIV report negative

## 2015-04-05 LAB — CERVICOVAGINAL ANCILLARY ONLY
Chlamydia: NEGATIVE
Neisseria Gonorrhea: NEGATIVE
Wet Prep (BD Affirm): POSITIVE — AB

## 2015-04-06 ENCOUNTER — Other Ambulatory Visit: Payer: Self-pay | Admitting: Physician Assistant

## 2015-04-07 NOTE — ED Notes (Signed)
Labs positive for yeast only. treatment adequate w diflucan, no further action required

## 2015-04-21 ENCOUNTER — Other Ambulatory Visit: Payer: Self-pay | Admitting: Physician Assistant

## 2015-04-21 DIAGNOSIS — N898 Other specified noninflammatory disorders of vagina: Secondary | ICD-10-CM

## 2015-04-21 NOTE — Progress Notes (Signed)
Patient's work up for vaginal discharge negative.  Will send to GYN for further eval. Jody BostonMichael Aleaha Fickling, MS, PA-C   6:01 PM, 04/21/2015

## 2015-05-09 ENCOUNTER — Emergency Department
Admission: EM | Admit: 2015-05-09 | Discharge: 2015-05-09 | Disposition: A | Discharge status: Home / Self Care | Payer: Medicaid Other | Attending: Emergency Medicine | Admitting: Emergency Medicine

## 2015-05-09 ENCOUNTER — Encounter: Payer: Self-pay | Admitting: *Deleted

## 2015-05-09 DIAGNOSIS — Z791 Long term (current) use of non-steroidal anti-inflammatories (NSAID): Secondary | ICD-10-CM | POA: Insufficient documentation

## 2015-05-09 DIAGNOSIS — Z79899 Other long term (current) drug therapy: Secondary | ICD-10-CM | POA: Diagnosis not present

## 2015-05-09 DIAGNOSIS — R1084 Generalized abdominal pain: Secondary | ICD-10-CM | POA: Diagnosis present

## 2015-05-09 DIAGNOSIS — Z3202 Encounter for pregnancy test, result negative: Secondary | ICD-10-CM | POA: Insufficient documentation

## 2015-05-09 DIAGNOSIS — N72 Inflammatory disease of cervix uteri: Secondary | ICD-10-CM | POA: Diagnosis not present

## 2015-05-09 LAB — URINALYSIS COMPLETE WITH MICROSCOPIC (ARMC ONLY)
Bilirubin Urine: NEGATIVE
GLUCOSE, UA: NEGATIVE mg/dL
HGB URINE DIPSTICK: NEGATIVE
Ketones, ur: NEGATIVE mg/dL
Leukocytes, UA: NEGATIVE
Nitrite: NEGATIVE
Protein, ur: NEGATIVE mg/dL
SPECIFIC GRAVITY, URINE: 1.03 (ref 1.005–1.030)
pH: 5 (ref 5.0–8.0)

## 2015-05-09 LAB — CHLAMYDIA/NGC RT PCR (ARMC ONLY)
Chlamydia Tr: NOT DETECTED
N gonorrhoeae: NOT DETECTED

## 2015-05-09 LAB — POCT PREGNANCY, URINE: Preg Test, Ur: NEGATIVE

## 2015-05-09 LAB — PREGNANCY, URINE: Preg Test, Ur: NEGATIVE

## 2015-05-09 LAB — WET PREP, GENITAL
Clue Cells Wet Prep HPF POC: NONE SEEN
Trich, Wet Prep: NONE SEEN
Yeast Wet Prep HPF POC: NONE SEEN

## 2015-05-09 MED ORDER — AZITHROMYCIN 1 G PO PACK
1.0000 g | PACK | Freq: Once | ORAL | Status: AC
  Administered 2015-05-09: 1 g via ORAL

## 2015-05-09 MED ORDER — LIDOCAINE HCL (PF) 1 % IJ SOLN
INTRAMUSCULAR | Status: AC
Start: 2015-05-09 — End: 2015-05-09
  Filled 2015-05-09: qty 5

## 2015-05-09 MED ORDER — LIDOCAINE HCL (PF) 1 % IJ SOLN
0.9000 mL | Freq: Once | INTRAMUSCULAR | Status: AC
  Administered 2015-05-09: 0.9 mL

## 2015-05-09 MED ORDER — AZITHROMYCIN 1 G PO PACK
PACK | ORAL | Status: AC
  Filled 2015-05-09: qty 1

## 2015-05-09 MED ORDER — CEFTRIAXONE SODIUM 250 MG IJ SOLR
INTRAMUSCULAR | Status: AC
Start: 2015-05-09 — End: 2015-05-09
  Filled 2015-05-09: qty 250

## 2015-05-09 MED ORDER — CEFTRIAXONE SODIUM 250 MG IJ SOLR
250.0000 mg | Freq: Once | INTRAMUSCULAR | Status: AC
  Administered 2015-05-09: 250 mg via INTRAMUSCULAR
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
## 2015-05-09 NOTE — ED Notes (Signed)
Pt c/o low abdominal/pelvic pain starting yesterday, w/ vaginal discharge that has a Rueckert odor. Pt denies new sexual partner. Pt denies condom use. Pt denies dysuria, itching, and burning.

## 2015-05-09 NOTE — ED Notes (Signed)
Pt alert and oriented X4, active, cooperative, pt in NAD. RR even and unlabored, color WNL.  Pt informed to return if any life threatening symptoms occur.   

## 2015-05-09 NOTE — Discharge Instructions (Signed)
Cervicitis °Cervicitis is a soreness and swelling (inflammation) of the cervix. Your cervix is located at the bottom of your uterus. It opens up to the vagina. °CAUSES  °· Sexually transmitted infections (STIs).   °· Allergic reaction.   °· Medicines or birth control devices that are put in the vagina.   °· Injury to the cervix.   °· Bacterial infections.   °RISK FACTORS °You are at greater risk if you: °· Have unprotected sexual intercourse. °· Have sexual intercourse with many partners. °· Began sexual intercourse at an early age. °· Have a history of STIs. °SYMPTOMS  °There may be no symptoms. If symptoms occur, they may include:  °· Gray, white, yellow, or bad-smelling vaginal discharge.   °· Pain or itching of the area outside the vagina.   °· Painful sexual intercourse.   °· Lower abdominal or lower back pain, especially during intercourse.   °· Frequent urination.   °· Abnormal vaginal bleeding between periods, after sexual intercourse, or after menopause.   °· Pressure or a heavy feeling in the pelvis.   °DIAGNOSIS  °Diagnosis is made after a pelvic exam. Other tests may include:  °· Examination of any discharge under a microscope (wet prep).   °· A Pap test.   °TREATMENT  °Treatment will depend on the cause of cervicitis. If it is caused by an STI, both you and your partner will need to be treated. Antibiotic medicines will be given.  °HOME CARE INSTRUCTIONS  °· Do not have sexual intercourse until your health care provider says it is okay.   °· Do not have sexual intercourse until your partner has been treated, if your cervicitis is caused by an STI.   °· Take your antibiotics as directed. Finish them even if you start to feel better.   °SEEK MEDICAL CARE IF: °· Your symptoms come back.   °· You have a fever.   °MAKE SURE YOU:  °· Understand these instructions. °· Will watch your condition. °· Will get help right away if you are not doing well or get worse. °Document Released: 11/13/2005 Document Revised:  11/18/2013 Document Reviewed: 05/07/2013 °ExitCare® Patient Information ©2015 [employer]. This information is not intended to replace advice given to you by your health care provider. Make sure you discuss any questions you have with your health care provider. ° °

## 2015-11-09 ENCOUNTER — Ambulatory Visit
Admission: EM | Admit: 2015-11-09 | Discharge: 2015-11-09 | Disposition: A | Discharge status: Home / Self Care | Payer: Medicaid Other | Attending: Family Medicine | Admitting: Family Medicine

## 2015-11-09 DIAGNOSIS — A499 Bacterial infection, unspecified: Secondary | ICD-10-CM | POA: Diagnosis not present

## 2015-11-09 DIAGNOSIS — N760 Acute vaginitis: Secondary | ICD-10-CM | POA: Diagnosis not present

## 2015-11-09 DIAGNOSIS — B9689 Other specified bacterial agents as the cause of diseases classified elsewhere: Secondary | ICD-10-CM

## 2015-11-09 DIAGNOSIS — N898 Other specified noninflammatory disorders of vagina: Secondary | ICD-10-CM | POA: Diagnosis present

## 2015-11-09 LAB — URINALYSIS COMPLETE WITH MICROSCOPIC (ARMC ONLY)
BILIRUBIN URINE: NEGATIVE
GLUCOSE, UA: NEGATIVE mg/dL
HGB URINE DIPSTICK: NEGATIVE
Ketones, ur: NEGATIVE mg/dL
LEUKOCYTES UA: NEGATIVE
Nitrite: NEGATIVE
Protein, ur: NEGATIVE mg/dL
RBC / HPF: NONE SEEN RBC/hpf (ref 0–5)
Specific Gravity, Urine: 1.02 (ref 1.005–1.030)
pH: 8 (ref 5.0–8.0)

## 2015-11-09 LAB — WET PREP, GENITAL
TRICH WET PREP: NONE SEEN
YEAST WET PREP: NONE SEEN

## 2015-11-09 LAB — PREGNANCY, URINE: PREG TEST UR: NEGATIVE

## 2015-11-09 LAB — CHLAMYDIA/NGC RT PCR (ARMC ONLY)
Chlamydia Tr: NOT DETECTED
N GONORRHOEAE: NOT DETECTED

## 2015-11-09 MED ORDER — METRONIDAZOLE 500 MG PO TABS: 500.0000 mg | ORAL_TABLET | Freq: Two times a day (BID) | ORAL | Status: DC

## 2015-11-09 NOTE — Discharge Instructions (Signed)

## 2015-11-09 NOTE — ED Provider Notes (Signed)
CSN: [id number]     Arrival date & time 11/09/15  1505 History   First MD Initiated Contact with Patient 11/09/15 1626     Chief Complaint  Patient presents with  . Vaginal Discharge   (Consider location/radiation/quality/duration/timing/severity/associated sxs/prior Treatment) HPI   24 year old female who presents with a 2 day history of discharge is malodorous and has a creamy yellow appearance. Extensive history of previous BV. Visit there is a slight itch but is not very bad. Denies any dyspareunia or dysuria. States that she has felt it "coming on" for several days prior to the actual discharge beginning. His past history of chlamydia, BV, trichinosis ,UTI., HSV and herpes genitalis.  Past Medical History  Diagnosis Date  . Chlamydia   . BV (bacterial vaginosis)   . Trichomonas   . UTI (lower urinary tract infection)   . HSV infection   . Herpes genitalis in women   . Miscarriage    Past Surgical History  Procedure Laterality Date  . No past surgeries     Family History  Problem Relation Age of Onset  . Hypertension Mother   . Hypertension Brother    Social History  Substance Use Topics  . Smoking status: Never Smoker   . Smokeless tobacco: Never Used  . Alcohol Use: No   OB History    Gravida Para Term Preterm AB TAB SAB Ectopic Multiple Living   Review of Systems  Constitutional: Negative for fever, chills, activity change, appetite change and fatigue.  Genitourinary: Positive for vaginal discharge.  Skin: Negative for color change, pallor and rash.  All other systems reviewed and are negative.   Allergies  Review of patient's allergies indicates no known allergies.  Home Medications   Prior to Admission medications   Medication Sig Start Date End Date Taking? Authorizing Provider  diclofenac (VOLTAREN) 50 MG EC tablet Take 1 tablet (50 mg total) by mouth 2 (two) times daily. 03/01/14   Jennifer Piepenbrink, PA-C  fluconazole  (DIFLUCAN) 150 MG tablet Take 1 tablet (150 mg total) by mouth once. Repeat if needed 04/03/15   Ofilia Neas, PA-C  ibuprofen (ADVIL,MOTRIN) 200 MG tablet Take 200 mg by mouth every 6 (six) hours as needed for headache.    Historical Provider, MD  methocarbamol (ROBAXIN) 500 MG tablet Take 1 tablet (500 mg total) by mouth 2 (two) times daily. 03/01/14   Jennifer Piepenbrink, PA-C  metroNIDAZOLE (FLAGYL) 500 MG tablet Take 1 tablet (500 mg total) by mouth 2 (two) times daily. 11/09/15   Lutricia Feil, PA-C   Meds Ordered and Administered this Visit  Medications - No data to display  BP 121/59 mmHg  Pulse 79  Temp(Src) 97 F (36.1 C) (Tympanic)  Resp 16  Ht  (1.6 m)  Wt 170 lb (77.111 kg)  BMI 30.12 kg/m2  SpO2 100%  LMP 10/07/2015 (Approximate) No data found.   Physical Exam  Constitutional: She is oriented to person, place, and time. She appears well-developed and well-nourished. No distress.  HENT:  Head: Normocephalic and atraumatic.  Eyes: Conjunctivae are normal. Pupils are equal, round, and reactive to light.  Neck: Normal range of motion. Neck supple.  Abdominal: Soft. Bowel sounds are normal. There is no tenderness. There is no rebound and no guarding.  Genitourinary:  Exam performed in the presence of Esau Grew, RN as chaperone. General external genitalia is normal. No excoriations or rash are  present. There is a very distinct fish smell malodorous present. Speculum exam was then performed with a thin  white to clear vaginal discharge present  in the vagina. She has a parous cervix with no cervical os discharge present.  Swabs Were taken for GC chlamydia and wet prep. Manual was then performed showing no adnexal or cervical motion tenderness.  Musculoskeletal: Normal range of motion. She exhibits no edema or tenderness.  Neurological: She is alert and oriented to person, place, and time.  Skin: Skin is warm and dry. No rash noted. She is not diaphoretic. No erythema.  No pallor.  Psychiatric: Her behavior is normal. Judgment and thought content normal.  Nursing note and vitals reviewed.   ED Course  Procedures (including critical care time)  Labs Review Labs Reviewed  WET PREP, GENITAL - Abnormal; Notable for the following:    Clue Cells Wet Prep HPF POC FEW (*)    WBC, Wet Prep HPF POC FEW (*)    All other components within normal limits  URINALYSIS COMPLETEWITH MICROSCOPIC ([facility] ONLY) - Abnormal; Notable for the following:    APPearance HAZY (*)    Bacteria, UA RARE (*)    Squamous Epithelial / LPF 6-30 (*)    All other components within normal limits  CHLAMYDIA/NGC RT PCR ([facility] ONLY)  PREGNANCY, URINE    Imaging Review No results found.   Visual Acuity Review  Right Eye Distance:   Left Eye Distance:   Bilateral Distance:    Right Eye Near:   Left Eye Near:    Bilateral Near:         MDM   1. BV (bacterial vaginosis)    New Prescriptions   METRONIDAZOLE (FLAGYL) 500 MG TABLET    Take 1 tablet (500 mg total) by mouth 2 (two) times daily.  Plan: 1. Test/x-ray results and diagnosis reviewed with patient 2. rx as per orders; risks, benefits, potential side effects reviewed with patient 3. Recommend supportive treatment with avoidance of alcohol and sex while on treatment. No Douches. 4. F/u prn if symptoms worsen or don't improve     Lutricia FeilWilliam P Tonette Koehne, PA-C 11/09/15 1705

## 2015-11-09 NOTE — ED Notes (Signed)
States has had foul smelling creamy yellow vaginal discharge x 1 week. Has Hx of bacterial vaginosis. States slightly itches

## 2015-12-30 ENCOUNTER — Ambulatory Visit
Admission: EM | Admit: 2015-12-30 | Discharge: 2015-12-30 | Disposition: A | Discharge status: Home / Self Care | Payer: Medicaid Other | Attending: Family Medicine | Admitting: Family Medicine

## 2015-12-30 ENCOUNTER — Encounter: Payer: Self-pay | Admitting: Emergency Medicine

## 2015-12-30 DIAGNOSIS — N760 Acute vaginitis: Secondary | ICD-10-CM

## 2015-12-30 DIAGNOSIS — A499 Bacterial infection, unspecified: Secondary | ICD-10-CM | POA: Diagnosis not present

## 2015-12-30 DIAGNOSIS — B9689 Other specified bacterial agents as the cause of diseases classified elsewhere: Secondary | ICD-10-CM

## 2015-12-30 DIAGNOSIS — N898 Other specified noninflammatory disorders of vagina: Secondary | ICD-10-CM | POA: Diagnosis present

## 2015-12-30 LAB — URINALYSIS COMPLETE WITH MICROSCOPIC (ARMC ONLY)
BILIRUBIN URINE: NEGATIVE
Glucose, UA: NEGATIVE mg/dL
Hgb urine dipstick: NEGATIVE
Ketones, ur: NEGATIVE mg/dL
Nitrite: NEGATIVE
PH: 7 (ref 5.0–8.0)
Protein, ur: 30 mg/dL — AB
Specific Gravity, Urine: 1.025 (ref 1.005–1.030)

## 2015-12-30 LAB — WET PREP, GENITAL
Sperm: NONE SEEN
TRICH WET PREP: NONE SEEN
YEAST WET PREP: NONE SEEN

## 2015-12-30 LAB — CHLAMYDIA/NGC RT PCR (ARMC ONLY)
Chlamydia Tr: NOT DETECTED
N gonorrhoeae: NOT DETECTED

## 2015-12-30 LAB — PREGNANCY, URINE: Preg Test, Ur: NEGATIVE

## 2015-12-30 MED ORDER — METRONIDAZOLE 500 MG PO TABS: 500.0000 mg | ORAL_TABLET | Freq: Two times a day (BID) | ORAL | Status: DC

## 2015-12-30 NOTE — ED Provider Notes (Signed)
CSN: [id number]     Arrival date & time 12/30/15  1031 History   First MD Initiated Contact with Patient 12/30/15 1132     Chief Complaint  Patient presents with  . Vaginal Discharge   (Consider location/radiation/quality/duration/timing/severity/associated sxs/prior Treatment) HPI   This 25 year old female who presents with a three-day history of a white vaginal discharge with a malodorous without any dysuria or pain. Seen with the same symptoms on 11/09/2015 time she was positive for BV with clue cells present. She states this is very similar. He is accompanied by her husband today along with her child.     Past Medical History  Diagnosis Date  . Chlamydia   . BV (bacterial vaginosis)   . Trichomonas   . UTI (lower urinary tract infection)   . HSV infection   . Herpes genitalis in women   . Miscarriage    Past Surgical History  Procedure Laterality Date  . No past surgeries     Family History  Problem Relation Age of Onset  . Hypertension Mother   . Hypertension Brother    Social History  Substance Use Topics  . Smoking status: Never Smoker   . Smokeless tobacco: Never Used  . Alcohol Use: No   OB History    Gravida Para Term Preterm AB TAB SAB Ectopic Multiple Living   Review of Systems  Constitutional: Negative for fever, chills and fatigue.  Genitourinary: Positive for vaginal discharge.  All other systems reviewed and are negative.   Allergies  Review of patient's allergies indicates no known allergies.  Home Medications   Prior to Admission medications   Medication Sig Start Date End Date Taking? Authorizing Provider  diclofenac (VOLTAREN) 50 MG EC tablet Take 1 tablet (50 mg total) by mouth 2 (two) times daily. 03/01/14   Jennifer Piepenbrink, PA-C  fluconazole (DIFLUCAN) 150 MG tablet Take 1 tablet (150 mg total) by mouth once. Repeat if needed 04/03/15   Ofilia Neas, PA-C  ibuprofen (ADVIL,MOTRIN) 200 MG tablet Take 200 mg by  mouth every 6 (six) hours as needed for headache.    Historical Provider, MD  methocarbamol (ROBAXIN) 500 MG tablet Take 1 tablet (500 mg total) by mouth 2 (two) times daily. 03/01/14   Jennifer Piepenbrink, PA-C  metroNIDAZOLE (FLAGYL) 500 MG tablet Take 1 tablet (500 mg total) by mouth 2 (two) times daily. 12/30/15   Lutricia Feil, PA-C   Meds Ordered and Administered this Visit  Medications - No data to display  BP 126/70 mmHg  Pulse 85  Temp(Src) 97.1 F (36.2 C) (Tympanic)  Resp 16  Ht  (1.6 m)  Wt 175 lb (79.379 kg)  BMI 31.01 kg/m2  SpO2 100%  LMP  (LMP Unknown) No data found.   Physical Exam  Constitutional: She is oriented to person, place, and time. She appears well-developed and well-nourished. No distress.  HENT:  Head: Normocephalic and atraumatic.  Eyes: Conjunctivae are normal. Pupils are equal, round, and reactive to light.  Neck: Normal range of motion. Neck supple.  Abdominal: She exhibits no distension. There is no tenderness. There is no rebound and no guarding.  Genitourinary:  Pelvic examination carried out in the presence of Esau Grew, Geologist, engineering. External genitalia shows mild here and I'll write the discharge at the introitus. Speculum exam revealed thick creamy discharge in the vagina  not adherent to the walls. Rx as  a parous os. Samples were taken and submitted to laboratory for examination. Manual exam revealed no adnexal or cervical tenderness. No masses are palpable.  Musculoskeletal: Normal range of motion. She exhibits no edema or tenderness.  Neurological: She is alert and oriented to person, place, and time.  Skin: Skin is warm and dry. She is not diaphoretic.  Psychiatric: She has a normal mood and affect. Her behavior is normal. Judgment and thought content normal.  Nursing note and vitals reviewed.   ED Course  Procedures (including critical care time)  Labs Review Labs Reviewed  WET PREP, GENITAL - Abnormal; Notable  for the following:    Clue Cells Wet Prep HPF POC PRESENT (*)    WBC, Wet Prep HPF POC FEW (*)    All other components within normal limits  URINALYSIS COMPLETEWITH MICROSCOPIC ([facility] ONLY) - Abnormal; Notable for the following:    APPearance HAZY (*)    Protein, ur 30 (*)    Leukocytes, UA 1+ (*)    Bacteria, UA FEW (*)    Squamous Epithelial / LPF 6-30 (*)    All other components within normal limits  CHLAMYDIA/NGC RT PCR ([facility] ONLY)  PREGNANCY, URINE    Imaging Review No results found.   Visual Acuity Review  Right Eye Distance:   Left Eye Distance:   Bilateral Distance:    Right Eye Near:   Left Eye Near:    Bilateral Near:         MDM   1. BV (bacterial vaginosis)    New Prescriptions   METRONIDAZOLE (FLAGYL) 500 MG TABLET    Take 1 tablet (500 mg total) by mouth 2 (two) times daily.  Plan: 1. Test/x-ray results and diagnosis reviewed with patient 2. rx as per orders; risks, benefits, potential side effects reviewed with patient 3. Recommend supportive treatment with a course of Flagyl. As this is recurrent it may be possible that her and her partner passing the bacteria back and forth. Recommended that he be seen and have evaluation placed on medication as well. Until that time I recommended they use condoms and also avoid all alcohol use. 4. F/u prn if symptoms worsen or don't improve     Lutricia Feil, PA-C 12/30/15 1244

## 2015-12-30 NOTE — Discharge Instructions (Signed)
Antibiotic Medicine °Antibiotic medicines are used to treat infections caused by bacteria. They work by injuring or killing the bacteria that is making you sick. °HOW IS AN ANTIBIOTIC CHOSEN? °An antibiotic is chosen based on many factors. To help your health care provider choose one for you, tell your health care provider if: °· You have any allergies. °· You are pregnant or plan to get pregnant. °· You are breastfeeding. °· You are taking any medicines. These include over-the-counter medicines, prescription medicines, and herbal remedies. °· You have a medical condition or problem you have not already discussed. °Your health care provider will also consider: °· How often the medicine has to be taken. °· Common side effects of the medicine. °· The cost of the medicine. °· The taste of the medicine. °If you have questions about why an antibiotic was chosen, make sure to ask. °FOR HOW LONG SHOULD I TAKE MY ANTIBIOTIC? °Continue to take your antibiotic for as long as told by your health care provider. Do not stop taking it when you feel better. If you stop taking it too soon: °· You may start to feel sick again. °· Your infection may become harder to treat. °· Complications may develop. °WHAT IF I MISS A DOSE? °Try not to miss any doses of medicine. If you miss a dose, take it as soon as possible. However, if it is almost time for the next dose: °· If you are taking 2 doses per day, take the missed dose and the next dose 5 to 6 hours apart. °· If you are taking 3 or more doses per day, take the missed dose and the next dose 2 to 4 hours apart, then go back to the normal schedule. °If you cannot make up a missed dose, take the next scheduled dose on time. Then take the missed dose after you have taken all the doses as recommended by your health care provider, as if you had one more dose left. °DO ANTIBIOTICS AFFECT BIRTH CONTROL? °Birth control pills may not work while you are on antibiotics. If you are taking birth  control pills, continue taking them as usual and use a second form of birth control, such as a condom, to avoid unwanted pregnancy. Continue using the second form of birth control until you are finished with your current 1 month cycle of birth control pills. °OTHER INFORMATION °· If there is any medicine left over, throw it away. °· Never take someone else's antibiotics. °· Never take leftover antibiotics. °SEEK MEDICAL CARE IF: °· You get worse. °· You do not feel better within a few days of starting the antibiotic medicine. °· You vomit. °· White patches appear in your mouth. °· You have new joint pain that begins after starting the antibiotic. °· You have new muscle aches that begin after starting the antibiotic. °· You had a fever before starting the antibiotic and it returns. °· You have any symptoms of an allergic reaction, such as an itchy rash. If this happens, stop taking the antibiotic. °SEEK IMMEDIATE MEDICAL CARE IF: °· Your urine turns dark or becomes blood-colored. °· Your skin turns yellow. °· You bruise or bleed easily. °· You have severe diarrhea and abdominal cramps. °· You have a severe headache. °· You have signs of a severe allergic reaction, such as: °¨ Trouble breathing. °¨ Wheezing. °¨ Swelling of the lips, tongue, or face. °¨ Fainting. °¨ Blisters on the skin or in the mouth. °If you have signs of a severe allergic   reaction, stop taking the antibiotic right away.   This information is not intended to replace advice given to you by your health care provider. Make sure you discuss any questions you have with your health care provider.   Document Released: 07/26/2004 Document Revised: 08/04/2015 Document Reviewed: 03/31/2015 Elsevier Interactive Patient Education 2016 [employer]  Bacterial Vaginosis Bacterial vaginosis is a vaginal infection that occurs when the normal balance of bacteria in the vagina is disrupted. It results from an overgrowth of certain bacteria. This is the most  common vaginal infection in women of childbearing age. Treatment is important to prevent complications, especially in pregnant women, as it can cause a premature delivery. CAUSES  Bacterial vaginosis is caused by an increase in harmful bacteria that are normally present in smaller amounts in the vagina. Several different kinds of bacteria can cause bacterial vaginosis. However, the reason that the condition develops is not fully understood. RISK FACTORS Certain activities or behaviors can put you at an increased risk of developing bacterial vaginosis, including:  Having a new sex partner or multiple sex partners.  Douching.  Using an intrauterine device (IUD) for contraception. Women do not get bacterial vaginosis from toilet seats, bedding, swimming pools, or contact with objects around them. SIGNS AND SYMPTOMS  Some women with bacterial vaginosis have no signs or symptoms. Common symptoms include:  Grey vaginal discharge.  A fishlike odor with discharge, especially after sexual intercourse.  Itching or burning of the vagina and vulva.  Burning or pain with urination. DIAGNOSIS  Your health care provider will take a medical history and examine the vagina for signs of bacterial vaginosis. A sample of vaginal fluid may be taken. Your health care provider will look at this sample under a microscope to check for bacteria and abnormal cells. A vaginal pH test may also be done.  TREATMENT  Bacterial vaginosis may be treated with antibiotic medicines. These may be given in the form of a pill or a vaginal cream. A second round of antibiotics may be prescribed if the condition comes back after treatment. Because bacterial vaginosis increases your risk for sexually transmitted diseases, getting treated can help reduce your risk for chlamydia, gonorrhea, HIV, and herpes. HOME CARE INSTRUCTIONS   Only take over-the-counter or prescription medicines as directed by your health care provider.  If  antibiotic medicine was prescribed, take it as directed. Make sure you finish it even if you start to feel better.  Tell all sexual partners that you have a vaginal infection. They should see their health care provider and be treated if they have problems, such as a mild rash or itching.  During treatment, it is important that you follow these instructions:  Avoid sexual activity or use condoms correctly.  Do not douche.  Avoid alcohol as directed by your health care provider.  Avoid breastfeeding as directed by your health care provider. SEEK MEDICAL CARE IF:   Your symptoms are not improving after 3 days of treatment.  You have increased discharge or pain.  You have a fever. MAKE SURE YOU:   Understand these instructions.  Will watch your condition.  Will get help right away if you are not doing well or get worse. FOR MORE INFORMATION  Centers for Disease Control and Prevention, Division of STD Prevention: [employer] American Sexual Health Association (ASHA): www.ashastd.org    This information is not intended to replace advice given to you by your health care provider. Make sure you discuss any questions you have with your  health care provider.   Document Released: 11/13/2005 Document Revised: 12/04/2014 Document Reviewed: 06/25/2013 Elsevier Interactive Patient Education [employer] Inc[employer]

## 2015-12-30 NOTE — ED Notes (Signed)
Pt reports vaginal discharge that started yesterday denies pain or other symptoms at this time

## 2016-02-12 ENCOUNTER — Ambulatory Visit
Admission: EM | Admit: 2016-02-12 | Discharge: 2016-02-12 | Disposition: A | Discharge status: Home / Self Care | Payer: Medicaid Other | Attending: Family Medicine | Admitting: Family Medicine

## 2016-02-12 DIAGNOSIS — N898 Other specified noninflammatory disorders of vagina: Secondary | ICD-10-CM | POA: Diagnosis present

## 2016-02-12 DIAGNOSIS — N760 Acute vaginitis: Secondary | ICD-10-CM | POA: Insufficient documentation

## 2016-02-12 LAB — URINALYSIS COMPLETE WITH MICROSCOPIC (ARMC ONLY)
Bilirubin Urine: NEGATIVE
Glucose, UA: NEGATIVE mg/dL
HGB URINE DIPSTICK: NEGATIVE
Ketones, ur: NEGATIVE mg/dL
LEUKOCYTES UA: NEGATIVE
Nitrite: NEGATIVE
PROTEIN: NEGATIVE mg/dL
SPECIFIC GRAVITY, URINE: 1.025 (ref 1.005–1.030)
pH: 6 (ref 5.0–8.0)

## 2016-02-12 LAB — WET PREP, GENITAL
Clue Cells Wet Prep HPF POC: NONE SEEN
Trich, Wet Prep: NONE SEEN
Yeast Wet Prep HPF POC: NONE SEEN

## 2016-02-12 LAB — CHLAMYDIA/NGC RT PCR (ARMC ONLY)
Chlamydia Tr: NOT DETECTED
N GONORRHOEAE: NOT DETECTED

## 2016-02-12 LAB — PREGNANCY, URINE: PREG TEST UR: NEGATIVE

## 2016-02-12 MED ORDER — FLUCONAZOLE 150 MG PO TABS: 150.0000 mg | ORAL_TABLET | Freq: Every day | ORAL | Status: DC

## 2016-02-12 NOTE — Discharge Instructions (Signed)
Take medication as prescribed. Rest. Pelvic rest until symptoms fully resolved. No douching. Drink plenty of fluids.   Follow up with your primary care physician this week. Follow up with OBGYN as needed. Return to Urgent care for new or worsening concerns.    Vaginitis Vaginitis is an inflammation of the vagina. It is most often caused by a change in the normal balance of the bacteria and yeast that live in the vagina. This change in balance causes an overgrowth of certain bacteria or yeast, which causes the inflammation. There are different types of vaginitis, but the most common types are:  Bacterial vaginosis.  Yeast infection (candidiasis).  Trichomoniasis vaginitis. This is a sexually transmitted infection (STI).  Viral vaginitis.  Atrophic vaginitis.  Allergic vaginitis. CAUSES  The cause depends on the type of vaginitis. Vaginitis can be caused by:  Bacteria (bacterial vaginosis).  Yeast (yeast infection).  A parasite (trichomoniasis vaginitis)  A virus (viral vaginitis).  Low hormone levels (atrophic vaginitis). Low hormone levels can occur during pregnancy, breastfeeding, or after menopause.  Irritants, such as bubble baths, scented tampons, and feminine sprays (allergic vaginitis). Other factors can change the normal balance of the yeast and bacteria that live in the vagina. These include:  Antibiotic medicines.  Poor hygiene.  Diaphragms, vaginal sponges, spermicides, birth control pills, and intrauterine devices (IUD).  Sexual intercourse.  Infection.  Uncontrolled diabetes.  A weakened immune system. SYMPTOMS  Symptoms can vary depending on the cause of the vaginitis. Common symptoms include:  Abnormal vaginal discharge.  The discharge is white, gray, or yellow with bacterial vaginosis.  The discharge is thick, white, and cheesy with a yeast infection.  The discharge is frothy and yellow or greenish with trichomoniasis.  A bad vaginal  odor.  The odor is fishy with bacterial vaginosis.  Vaginal itching, pain, or swelling.  Painful intercourse.  Pain or burning when urinating. Sometimes, there are no symptoms. TREATMENT  Treatment will vary depending on the type of infection.   Bacterial vaginosis and trichomoniasis are often treated with antibiotic creams or pills.  Yeast infections are often treated with antifungal medicines, such as vaginal creams or suppositories.  Viral vaginitis has no cure, but symptoms can be treated with medicines that relieve discomfort. Your sexual partner should be treated as well.  Atrophic vaginitis may be treated with an estrogen cream, pill, suppository, or vaginal ring. If vaginal dryness occurs, lubricants and moisturizing creams may help. You may be told to avoid scented soaps, sprays, or douches.  Allergic vaginitis treatment involves quitting the use of the product that is causing the problem. Vaginal creams can be used to treat the symptoms. HOME CARE INSTRUCTIONS   Take all medicines as directed by your caregiver.  Keep your genital area clean and dry. Avoid soap and only rinse the area with water.  Avoid douching. It can remove the healthy bacteria in the vagina.  Do not use tampons or have sexual intercourse until your vaginitis has been treated. Use sanitary pads while you have vaginitis.  Wipe from front to back. This avoids the spread of bacteria from the rectum to the vagina.  Let air reach your genital area.  Wear cotton underwear to decrease moisture buildup.  Avoid wearing underwear while you sleep until your vaginitis is gone.  Avoid tight pants and underwear or nylons without a cotton panel.  Take off wet clothing (especially bathing suits) as soon as possible.  Use mild, non-scented products. Avoid using irritants, such as:  Scented  feminine sprays.  Fabric softeners.  Scented detergents.  Scented tampons.  Scented soaps or bubble  baths.  Practice safe sex and use condoms. Condoms may prevent the spread of trichomoniasis and viral vaginitis. SEEK MEDICAL CARE IF:   You have abdominal pain.  You have a fever or persistent symptoms for more than 2-3 days.  You have a fever and your symptoms suddenly get worse.   This information is not intended to replace advice given to you by your health care provider. Make sure you discuss any questions you have with your health care provider.   Document Released: 09/10/2007 Document Revised: 03/30/2015 Document Reviewed: 04/25/2012 Elsevier Interactive Patient Education [employer]2016 [employer] Inc.

## 2016-02-12 NOTE — ED Notes (Signed)
And irritation x 2 weeks. Also c/o dysuria. Discharge is white, with foul smell. Pt does admit having unprotected sex. Pt's partner does not have STD sx. Pt believes this is BV.
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
## 2016-02-14 LAB — URINE CULTURE: Culture: 3000

## 2016-04-03 ENCOUNTER — Encounter: Payer: Self-pay | Admitting: *Deleted

## 2016-04-03 ENCOUNTER — Emergency Department: Payer: Medicaid Other

## 2016-04-03 ENCOUNTER — Emergency Department
Admission: EM | Admit: 2016-04-03 | Discharge: 2016-04-03 | Disposition: A | Discharge status: Home / Self Care | Payer: Medicaid Other | Attending: Emergency Medicine | Admitting: Emergency Medicine

## 2016-04-03 DIAGNOSIS — R1030 Lower abdominal pain, unspecified: Secondary | ICD-10-CM | POA: Diagnosis present

## 2016-04-03 DIAGNOSIS — O009 Unspecified ectopic pregnancy without intrauterine pregnancy: Secondary | ICD-10-CM | POA: Diagnosis not present

## 2016-04-03 DIAGNOSIS — Z3A01 Less than 8 weeks gestation of pregnancy: Secondary | ICD-10-CM | POA: Insufficient documentation

## 2016-04-03 LAB — BASIC METABOLIC PANEL
Anion gap: 6 (ref 5–15)
BUN: 13 mg/dL (ref 6–20)
CO2: 25 mmol/L (ref 22–32)
Calcium: 8.8 mg/dL — ABNORMAL LOW (ref 8.9–10.3)
Chloride: 105 mmol/L (ref 101–111)
Creatinine, Ser: 0.8 mg/dL (ref 0.44–1.00)
GFR calc Af Amer: >60 mL/min (ref >60)
GFR calc non Af Amer: >60 mL/min (ref >60)
Glucose, Bld: 81 mg/dL (ref 65–99)
Potassium: 3.6 mmol/L (ref 3.5–5.1)
Sodium: 136 mmol/L (ref 135–145)

## 2016-04-03 LAB — WET PREP, GENITAL
Clue Cells Wet Prep HPF POC: NONE SEEN
Sperm: NONE SEEN
Trich, Wet Prep: NONE SEEN
Yeast Wet Prep HPF POC: NONE SEEN

## 2016-04-03 LAB — CBC
HCT: 38.4 % (ref 35.0–47.0)
Hemoglobin: 12.6 g/dL (ref 12.0–16.0)
MCH: 30.6 pg (ref 26.0–34.0)
MCHC: 32.8 g/dL (ref 32.0–36.0)
MCV: 93.2 fL (ref 80.0–100.0)
Platelets: 317 10*3/uL (ref 150–440)
RBC: 4.12 MIL/uL (ref 3.80–5.20)
RDW: 13 % (ref 11.5–14.5)
WBC: 13 10*3/uL — ABNORMAL HIGH (ref 3.6–11.0)

## 2016-04-03 LAB — CHLAMYDIA/NGC RT PCR (ARMC ONLY)
CHLAMYDIA TR: NOT DETECTED
N GONORRHOEAE: NOT DETECTED

## 2016-04-03 LAB — POCT PREGNANCY, URINE: Preg Test, Ur: POSITIVE — AB

## 2016-04-03 LAB — HCG, QUANTITATIVE, PREGNANCY: HCG, BETA CHAIN, QUANT, S: 11026 m[IU]/mL — AB (ref <5)

## 2016-04-03 NOTE — ED Notes (Signed)
Patient transported to Ultrasound 

## 2016-04-03 NOTE — ED Notes (Signed)
Pt c/o R pelvic pain x 3 weeks, vaginal discharge that is white x 3 days. Pt c/o recent headaches that are relieved by ibuprofen.
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
## 2016-04-03 NOTE — Discharge Instructions (Signed)
Please have repeat US in 1 week to determine if your pregnancy is in your uterus.   Abdominal Pain, Adult Many things can cause abdominal pain. Usually, abdominal pain is not caused by a disease and will improve without treatment. It can often be observed and treated at home. Your health care provider will do a physical exam and possibly order blood tests and X-rays to help determine the seriousness of your pain. However, in many cases, more time must pass before a clear cause of the pain can be found. Before that point, your health care provider may not know if you need more testing or further treatment. HOME CARE INSTRUCTIONS Monitor your abdominal pain for any changes. The following actions may help to alleviate any discomfort you are experiencing:  Only take over-the-counter or prescription medicines as directed by your health care provider.  Do not take laxatives unless directed to do so by your health care provider.  Try a clear liquid diet (broth, tea, or water) as directed by your health care provider. Slowly move to a bland diet as tolerated. SEEK MEDICAL CARE IF:  You have unexplained abdominal pain.  You have abdominal pain associated with nausea or diarrhea.  You have pain when you urinate or have a bowel movement.  You experience abdominal pain that wakes you in the night.  You have abdominal pain that is worsened or improved by eating food.  You have abdominal pain that is worsened with eating fatty foods.  You have a fever. SEEK IMMEDIATE MEDICAL CARE IF:  Your pain does not go away within 2 hours.  You keep throwing up (vomiting).  Your pain is felt only in portions of the abdomen, such as the right side or the left lower portion of the abdomen.  You pass bloody or black tarry stools. MAKE SURE YOU:  Understand these instructions.  Will watch your condition.  Will get help right away if you are not doing well or get worse.   This information is not intended  to replace advice given to you by your health care provider. Make sure you discuss any questions you have with your health care provider.   Document Released: 08/23/2005 Document Revised: 08/04/2015 Document Reviewed: 07/23/2013 Elsevier Interactive Patient Education 2016 Elsevier Inc.  Ectopic Pregnancy An ectopic pregnancy is when the fertilized egg attaches (implants) outside the uterus. Most ectopic pregnancies occur in the fallopian tube. Rarely do ectopic pregnancies occur on the ovary, intestine, pelvis, or cervix. In an ectopic pregnancy, the fertilized egg does not have the ability to develop into a normal, healthy baby.  A ruptured ectopic pregnancy is one in which the fallopian tube gets torn or bursts and results in internal bleeding. Often there is intense abdominal pain, and sometimes, vaginal bleeding. Having an ectopic pregnancy can be life threatening. If left untreated, this dangerous condition can lead to a blood transfusion, abdominal surgery, or even death. CAUSES  Damage to the fallopian tubes is the suspected cause in most ectopic pregnancies.  RISK FACTORS Depending on your circumstances, the risk of having an ectopic pregnancy will vary. The level of risk can be divided into three categories. High Risk  You have gone through infertility treatment.  You have had a previous ectopic pregnancy.  You have had previous tubal surgery.  You have had previous surgery to have the fallopian tubes tied (tubal ligation).  You have tubal problems or diseases.  You have been exposed to DES. DES is a medicine that was used  until 1971 and had effects on babies whose mothers took the medicine.  You become pregnant while using an intrauterine device (IUD) for birth control. Moderate Risk  You have a history of infertility.  You have a history of a sexually transmitted infection (STI).  You have a history of pelvic inflammatory disease (PID).  You have scarring from  endometriosis.  You have multiple sexual partners.  You smoke. Low Risk  You have had previous pelvic surgery.  You use vaginal douching.  You became sexually active before 24 years of age. SIGNS AND SYMPTOMS  An ectopic pregnancy should be suspected in anyone who has missed a period and has abdominal pain or bleeding.  You may experience normal pregnancy symptoms, such as:  Nausea.  Tiredness.  Breast tenderness.  Other symptoms may include:  Pain with intercourse.  Irregular vaginal bleeding or spotting.  Cramping or pain on one side or in the lower abdomen.  Fast heartbeat.  Passing out while having a bowel movement.  Symptoms of a ruptured ectopic pregnancy and internal bleeding may include:  Sudden, severe pain in the abdomen and pelvis.  Dizziness or fainting.  Pain in the shoulder area. DIAGNOSIS  Tests that may be performed include:  A pregnancy test.  An ultrasound test.  Testing the specific level of pregnancy hormone in the bloodstream.  Taking a sample of uterus tissue (dilation and curettage, D&C).  Surgery to perform a visual exam of the inside of the abdomen using a thin, lighted tube with a tiny camera on the end (laparoscope). TREATMENT  An injection of a medicine called methotrexate may be given. This medicine causes the pregnancy tissue to be absorbed. It is given if:  The diagnosis is made early.  The fallopian tube has not ruptured.  You are considered to be a good candidate for the medicine. Usually, pregnancy hormone blood levels are checked after methotrexate treatment. This is to be sure the medicine is effective. It may take 4-6 weeks for the pregnancy to be absorbed (though most pregnancies will be absorbed by 3 weeks). Surgical treatment may be needed. A laparoscope may be used to remove the pregnancy tissue. If severe internal bleeding occurs, a cut (incision) may be made in the lower abdomen (laparotomy), and the ectopic  pregnancy is removed. This stops the bleeding. Part of the fallopian tube, or the whole tube, may be removed as well (salpingectomy). After surgery, pregnancy hormone tests may be done to be sure there is no pregnancy tissue left. You may receive a Rho (D) immune globulin shot if you are Rh negative and the father is Rh positive, or if you do not know the Rh type of the father. This is to prevent problems with any future pregnancy. SEEK IMMEDIATE MEDICAL CARE IF:  You have any symptoms of an ectopic pregnancy. This is a medical emergency. MAKE SURE YOU:  Understand these instructions.  Will watch your condition.  Will get help right away if you are not doing well or get worse.   This information is not intended to replace advice given to you by your health care provider. Make sure you discuss any questions you have with your health care provider.   Document Released: 12/21/2004 Document Revised: 12/04/2014 Document Reviewed: 06/12/2013 Elsevier Interactive Patient Education [employer]2016 Elsevier Inc.

## 2016-04-05 ENCOUNTER — Inpatient Hospital Stay (HOSPITAL_COMMUNITY): Payer: Medicaid Other

## 2016-04-05 ENCOUNTER — Inpatient Hospital Stay (HOSPITAL_COMMUNITY)
Admission: AD | Admit: 2016-04-05 | Discharge: 2016-04-05 | Disposition: A | Discharge status: Home / Self Care | Payer: Medicaid Other | Source: Ambulatory Visit | Attending: Obstetrics & Gynecology | Admitting: Obstetrics & Gynecology

## 2016-04-05 ENCOUNTER — Encounter (HOSPITAL_COMMUNITY): Payer: Self-pay | Admitting: *Deleted

## 2016-04-05 DIAGNOSIS — Z3A01 Less than 8 weeks gestation of pregnancy: Secondary | ICD-10-CM | POA: Insufficient documentation

## 2016-04-05 DIAGNOSIS — O9989 Other specified diseases and conditions complicating pregnancy, childbirth and the puerperium: Secondary | ICD-10-CM

## 2016-04-05 DIAGNOSIS — Z79899 Other long term (current) drug therapy: Secondary | ICD-10-CM | POA: Insufficient documentation

## 2016-04-05 DIAGNOSIS — R1031 Right lower quadrant pain: Secondary | ICD-10-CM | POA: Diagnosis present

## 2016-04-05 DIAGNOSIS — O26899 Other specified pregnancy related conditions, unspecified trimester: Secondary | ICD-10-CM

## 2016-04-05 DIAGNOSIS — R109 Unspecified abdominal pain: Secondary | ICD-10-CM | POA: Diagnosis not present

## 2016-04-05 DIAGNOSIS — N8311 Corpus luteum cyst of right ovary: Secondary | ICD-10-CM | POA: Insufficient documentation

## 2016-04-05 DIAGNOSIS — O3481 Maternal care for other abnormalities of pelvic organs, first trimester: Secondary | ICD-10-CM | POA: Diagnosis not present

## 2016-04-05 LAB — URINE MICROSCOPIC-ADD ON: RBC / HPF: NONE SEEN RBC/hpf (ref 0–5)

## 2016-04-05 LAB — CBC WITH DIFFERENTIAL/PLATELET
BASOS ABS: 0 10*3/uL (ref 0.0–0.1)
BASOS PCT: 0 %
EOS ABS: 0.4 10*3/uL (ref 0.0–0.7)
EOS PCT: 3 %
HCT: 36.2 % (ref 36.0–46.0)
Hemoglobin: 12.2 g/dL (ref 12.0–15.0)
Lymphocytes Relative: 21 %
Lymphs Abs: 3.5 10*3/uL (ref 0.7–4.0)
MCH: 30.7 pg (ref 26.0–34.0)
MCHC: 33.7 g/dL (ref 30.0–36.0)
MCV: 91.2 fL (ref 78.0–100.0)
MONO ABS: 0.6 10*3/uL (ref 0.1–1.0)
Monocytes Relative: 4 %
NEUTROS ABS: 11.9 10*3/uL — AB (ref 1.7–7.7)
Neutrophils Relative %: 72 %
PLATELETS: 315 10*3/uL (ref 150–400)
RBC: 3.97 MIL/uL (ref 3.87–5.11)
RDW: 13 % (ref 11.5–15.5)
WBC: 16.4 10*3/uL — ABNORMAL HIGH (ref 4.0–10.5)

## 2016-04-05 LAB — URINALYSIS, ROUTINE W REFLEX MICROSCOPIC
Bilirubin Urine: NEGATIVE
GLUCOSE, UA: NEGATIVE mg/dL
Hgb urine dipstick: NEGATIVE
Ketones, ur: NEGATIVE mg/dL
Nitrite: NEGATIVE
PROTEIN: NEGATIVE mg/dL
Specific Gravity, Urine: 1.02 (ref 1.005–1.030)
pH: 7 (ref 5.0–8.0)

## 2016-04-05 LAB — HCG, QUANTITATIVE, PREGNANCY: HCG, BETA CHAIN, QUANT, S: 13809 m[IU]/mL — AB (ref <5)

## 2016-04-05 IMAGING — US US OB TRANSVAGINAL
1 series · 15 of 28 positions shown · non-contrast
Comparison: None.

CLINICAL DATA: Abdominal pain

EXAM:
TRANSVAGINAL OB ULTRASOUND
TECHNIQUE: Transvaginal ultrasound was performed for complete evaluation of the
gestation as well as the maternal uterus, adnexal regions, and
pelvic cul-de-sac.

[Series 1: us ob transvaginal · 15 of 40 slices shown]
[im 1/40]
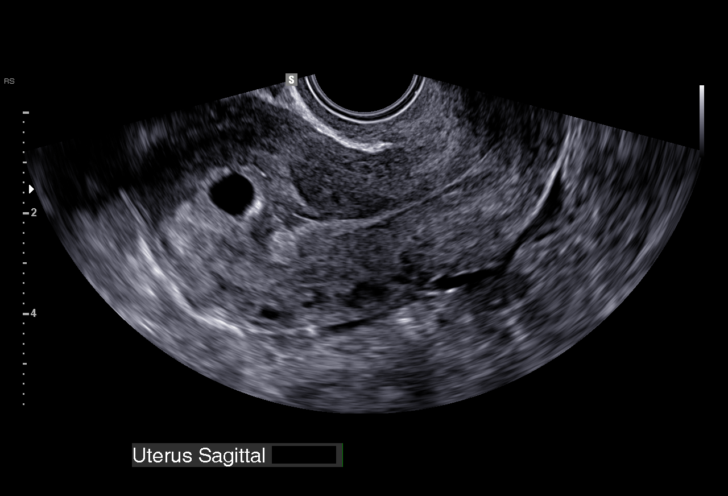
[im 3/40]
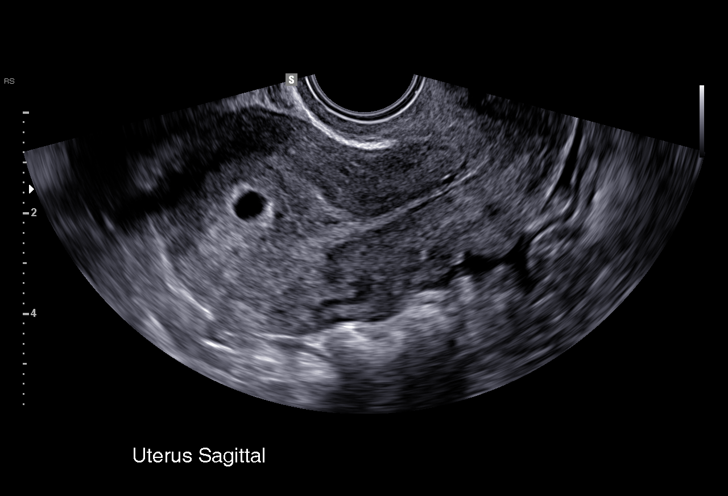
[im 6/40]
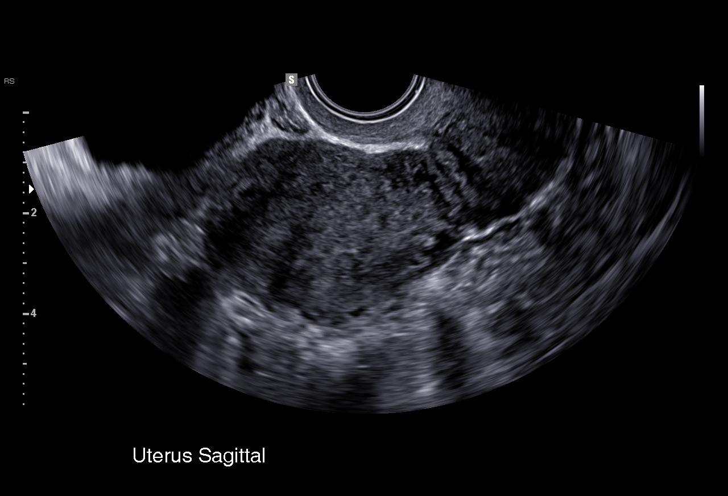
[im 9/40]
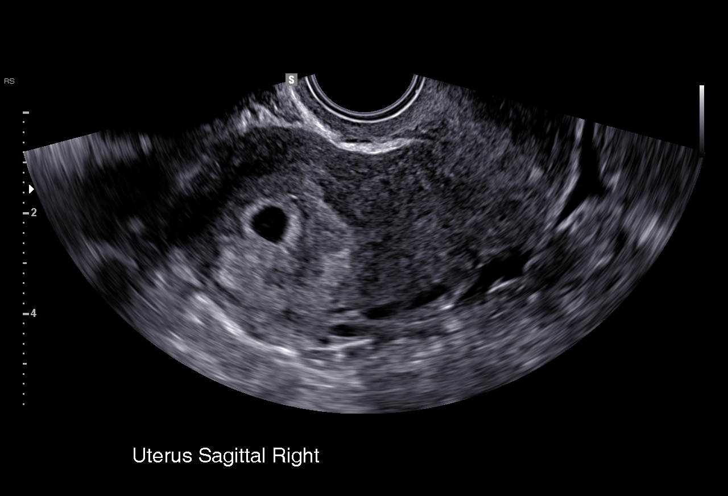
[im 12/40]
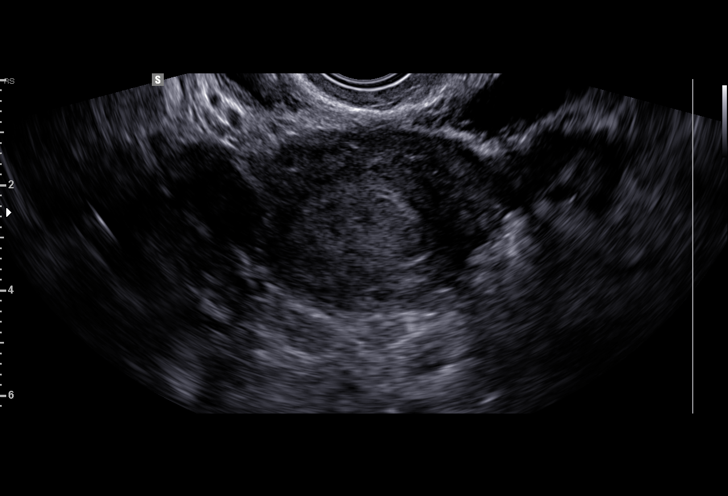
[im 15/40]
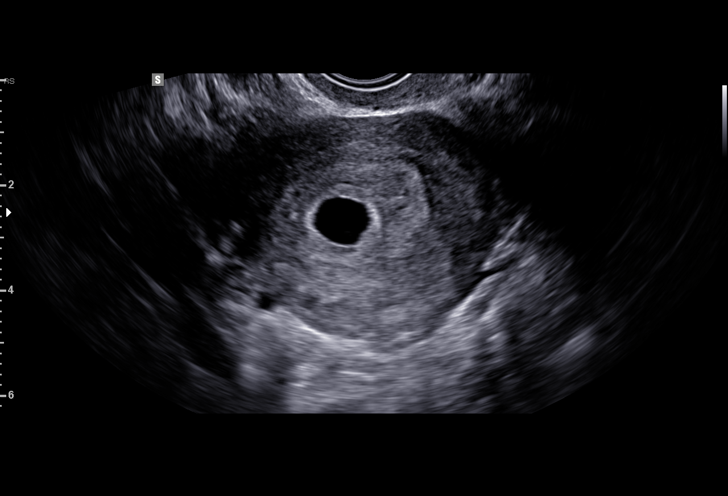
[im 18/40]
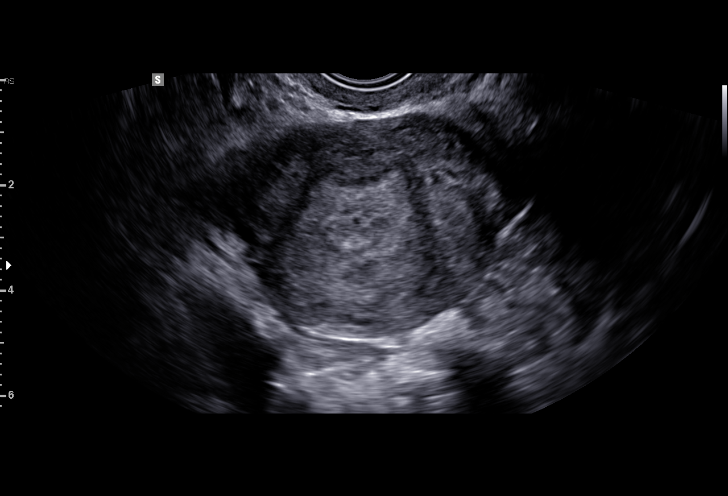
[im 21/40]
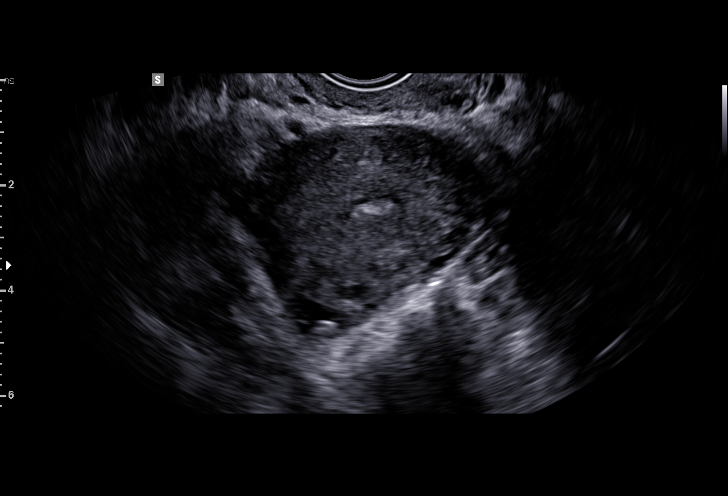
[im 22/40]
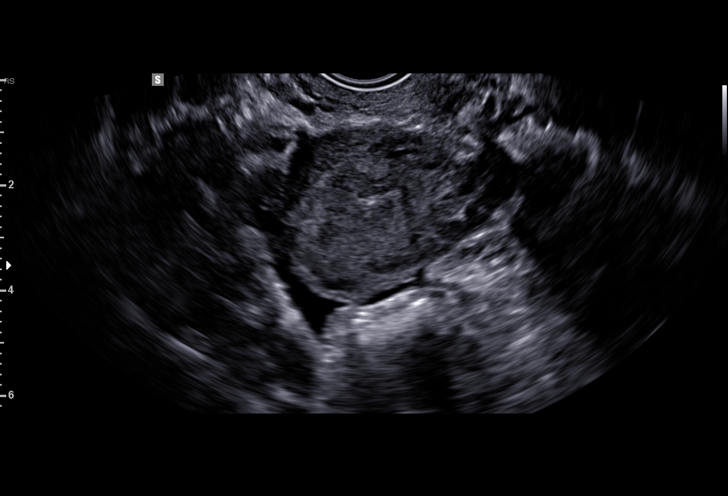
[im 25/40]
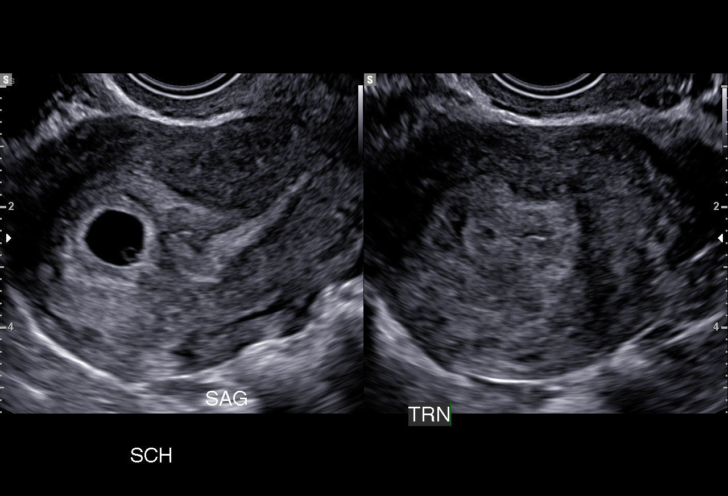
[im 28/40]
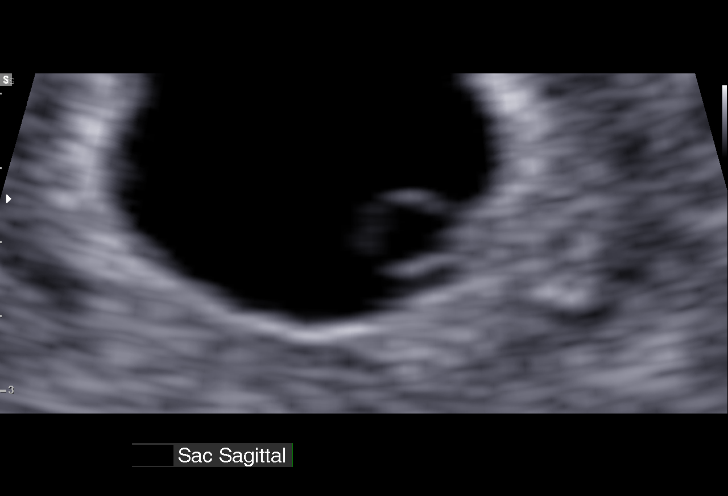
[im 31/40]
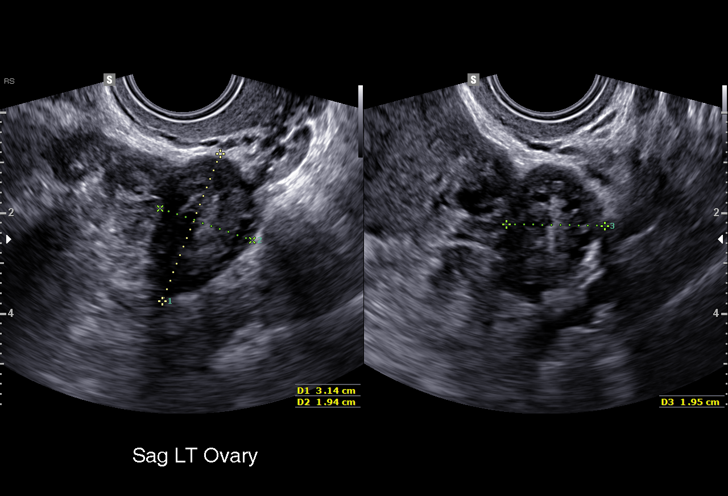
[im 34/40]
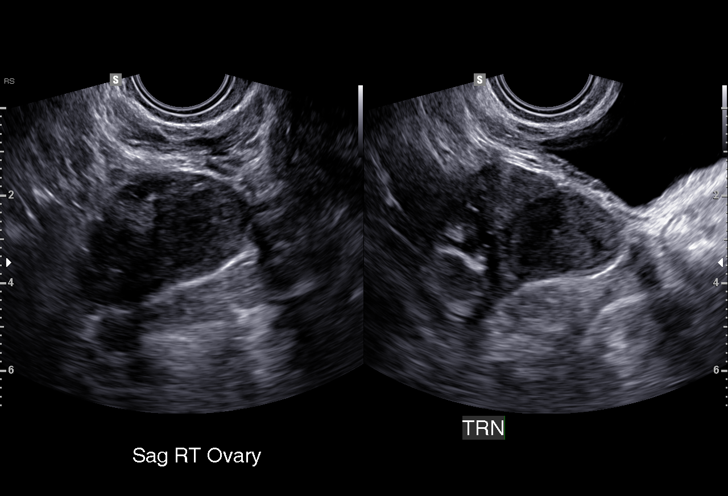
[im 37/40]
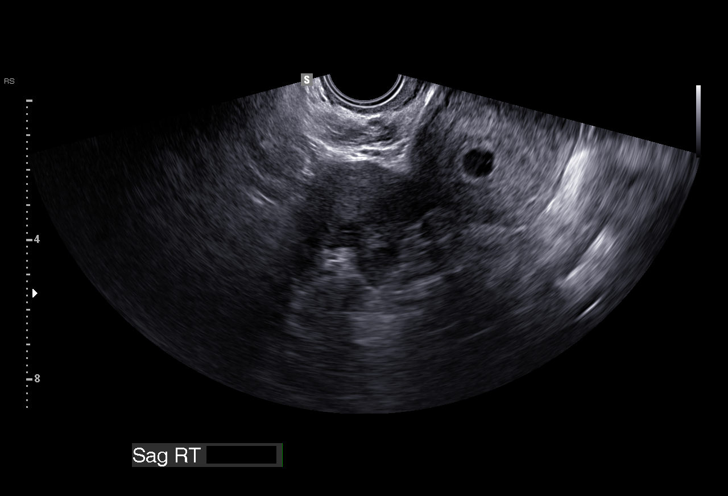
[im 40/40]
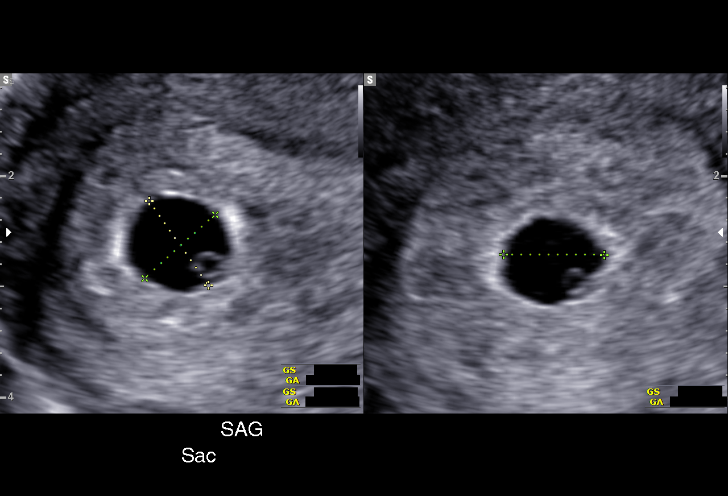

[15 of 28 positions shown; findings below may reference images not displayed]

FINDINGS: Intrauterine gestational sac: Same

Yolk sac:  Yes

Embryo:  None

Cardiac Activity: No

Heart Rate:  bpm

MSD: 9  mm   5 w   5  d

CRL:     mm    w  d                  US EDC:

Subchorionic hemorrhage:  Small

Maternal uterus/adnexae: Normal left ovary. Right ovarian complex
lesion likely represents a corpus luteal cyst. Trace free pelvic
fluid.
IMPRESSION: Single intrauterine gestational sac with visible yolk sac but no
fetal pole or cardiac activity yet visualized. Recommend follow-up
quantitative B-HCG levels and follow-up US in 14 days to confirm and
assess viability. This recommendation follows SRU consensus
guidelines: Diagnostic Criteria for Nonviable Pregnancy Early in the
First Trimester. N Engl J Med [TG]; [DATE].

## 2016-04-05 NOTE — Discharge Instructions (Signed)

## 2016-04-05 NOTE — MAU Note (Signed)
Pt reports she was seen at the hospital 2 days ago and positive preg test. States she has been having sharp lower right abd pain for several days, pain is the same but is not going away.

## 2016-04-05 NOTE — MAU Provider Note (Signed)
History     CSN: [id number]  Arrival date and time: 04/05/16 4098   First Provider Initiated Contact with Patient 04/05/16 0232      Chief Complaint  Patient presents with  . Abdominal Pain   HPI Ms. Jody Perez is a 25 y.o. G3P1011 at [redacted]w[redacted]d who presents to MAU today with complaint of RLQ abdominal pain. The patient was evaluated at [facility] 2 days ago with onset of pain. She was told that she had ovarian cyst and possible ectopic and should follow-up with [facility] within the week. She states that pain has continued. It is the same as at onset. She rates pain at 7/10 now. She is not taking anything for pain. She denies N/V, fever, UTI symptoms, vaginal bleeding or abnormal discharge.   OB History    Gravida Para Term Preterm AB TAB SAB Ectopic Multiple Living   Past Medical History  Diagnosis Date  . Chlamydia   . BV (bacterial vaginosis)   . Trichomonas   . UTI (lower urinary tract infection)   . HSV infection   . Herpes genitalis in women   . Miscarriage     Past Surgical History  Procedure Laterality Date  . No past surgeries      Family History  Problem Relation Age of Onset  . Hypertension Mother   . Hypertension Brother     Social History  Substance Use Topics  . Smoking status: Never Smoker   . Smokeless tobacco: Never Used  . Alcohol Use: Yes     Comment: socially    Allergies: No Known Allergies  Prescriptions prior to admission  Medication Sig Dispense Refill Last Dose  . diclofenac (VOLTAREN) 50 MG EC tablet Take 1 tablet (50 mg total) by mouth 2 (two) times daily. 21 tablet 0 Unknown at Unknown time  . fluconazole (DIFLUCAN) 150 MG tablet Take 1 tablet (150 mg total) by mouth daily. Take one pill orally, then Repeat in 72 hours as needed. 2 tablet 0   . ibuprofen (ADVIL,MOTRIN) 200 MG tablet Take 200 mg by mouth every 6 (six) hours as needed for headache.   More than a month at Unknown time  . methocarbamol (ROBAXIN) 500 MG  tablet Take 1 tablet (500 mg total) by mouth 2 (two) times daily. 20 tablet 0 Unknown at Unknown time  . metroNIDAZOLE (FLAGYL) 500 MG tablet Take 1 tablet (500 mg total) by mouth 2 (two) times daily. 14 tablet 1     Review of Systems  Constitutional: Negative for fever and malaise/fatigue.  Gastrointestinal: Positive for abdominal pain. Negative for nausea, vomiting, diarrhea and constipation.  Genitourinary: Negative for dysuria, urgency and frequency.       Neg - vaginal bleeding, abnormal discharge   Physical Exam   Blood pressure 136/75, pulse 78, temperature 97.8 F (36.6 C), temperature source Oral, resp. rate 16, height  (1.6 m), weight 175 lb (79.379 kg), last menstrual period 02/09/2016, SpO2 100 %.  Physical Exam  Nursing note and vitals reviewed. Constitutional: She is oriented to person, place, and time. She appears well-developed and well-nourished. No distress.  HENT:  Head: Normocephalic and atraumatic.  Cardiovascular: Normal rate.   Respiratory: Effort normal.  GI: Soft. She exhibits no distension and no mass. There is tenderness (mild RLQ abdominal tenderness to palpation). There is no rebound and no guarding.  Neurological: She is alert and oriented to person, place, and  time.  Skin: Skin is warm and dry. No erythema.  Psychiatric: She has a normal mood and affect.    Results for orders placed or performed during the hospital encounter of 04/05/16 (from the past 24 hour(s))  Urinalysis, Routine w reflex microscopic (not at [facility])     Status: Abnormal   Collection Time: 04/05/16  2:05 AM  Result Value Ref Range   Color, Urine YELLOW YELLOW   APPearance CLEAR CLEAR   Specific Gravity, Urine 1.020 1.005 - 1.030   pH 7.0 5.0 - 8.0   Glucose, UA NEGATIVE NEGATIVE mg/dL   Hgb urine dipstick NEGATIVE NEGATIVE   Bilirubin Urine NEGATIVE NEGATIVE   Ketones, ur NEGATIVE NEGATIVE mg/dL   Protein, ur NEGATIVE NEGATIVE mg/dL   Nitrite NEGATIVE NEGATIVE    Leukocytes, UA TRACE (A) NEGATIVE  Urine microscopic-add on     Status: Abnormal   Collection Time: 04/05/16  2:05 AM  Result Value Ref Range   Squamous Epithelial / LPF 0-5 (A) NONE SEEN   WBC, UA 0-5 0 - 5 WBC/hpf   RBC / HPF NONE SEEN 0 - 5 RBC/hpf   Bacteria, UA RARE (A) NONE SEEN  CBC with Differential/Platelet     Status: Abnormal   Collection Time: 04/05/16  2:38 AM  Result Value Ref Range   WBC 16.4 (H) 4.0 - 10.5 K/uL   RBC 3.97 3.87 - 5.11 MIL/uL   Hemoglobin 12.2 12.0 - 15.0 g/dL   HCT 16.1 09.6 - 04.5 %   MCV 91.2 78.0 - 100.0 fL   MCH 30.7 26.0 - 34.0 pg   MCHC 33.7 30.0 - 36.0 g/dL   RDW 40.9 81.1 - 91.4 %   Platelets 315 150 - 400 K/uL   Neutrophils Relative % 72 %   Neutro Abs 11.9 (H) 1.7 - 7.7 K/uL   Lymphocytes Relative 21 %   Lymphs Abs 3.5 0.7 - 4.0 K/uL   Monocytes Relative 4 %   Monocytes Absolute 0.6 0.1 - 1.0 K/uL   Eosinophils Relative 3 %   Eosinophils Absolute 0.4 0.0 - 0.7 K/uL   Basophils Relative 0 %   Basophils Absolute 0.0 0.0 - 0.1 K/uL   US Ob Comp Less 14 Wks  04/03/2016  CLINICAL DATA:  Right pelvic pain for 3 weeks EXAM: OBSTETRIC <14 WK [place] AND TRANSVAGINAL OB US TECHNIQUE: Both transabdominal and transvaginal ultrasound examinations were performed for complete evaluation of the gestation as well as the maternal uterus, adnexal regions, and pelvic cul-de-sac. Transvaginal technique was performed to assess early pregnancy. COMPARISON:  None. FINDINGS: Intrauterine gestational sac: Single Yolk sac:  No Embryo:  None Cardiac Activity: No Heart Rate:   bpm MSD: 7  mm   5 w   3  d CRL:    mm    w    d                  [place] EDC: Subchorionic hemorrhage:  None visualized. Maternal uterus/adnexae: 2.6 cm complex abnormality in the right adnexal region, probably a right ovarian corpus luteal cyst but not conclusively characterized. Normal left ovary. Small volume free pelvic fluid. IMPRESSION: Small intrauterine sac is visible, without identification of a  yolk sac or fetal parts. The complex right ovarian abnormality is most likely a corpus luteal cyst, but ectopic gestation is not entirely excluded. Recommend follow-up quantitative B-HCG levels and follow-up US in 14 days to confirm and assess viability. This recommendation follows SRU consensus guidelines: Diagnostic Criteria  for Nonviable Pregnancy Early in the First Trimester. Malva Limes Med 2013; 161:0960-45. Electronically Signed   By: Ellery Plunk M.D.   On: 04/03/2016 06:03   US Ob Transvaginal  04/05/2016  CLINICAL DATA:  Abdominal pain EXAM: TRANSVAGINAL OB ULTRASOUND TECHNIQUE: Transvaginal ultrasound was performed for complete evaluation of the gestation as well as the maternal uterus, adnexal regions, and pelvic cul-de-sac. COMPARISON:  None. FINDINGS: Intrauterine gestational sac: Same Yolk sac:  Yes Embryo:  None Cardiac Activity: No Heart Rate:  bpm MSD: 9  mm   5 w   5  d CRL:     mm    w  d                  [place] EDC: Subchorionic hemorrhage:  Small Maternal uterus/adnexae: Normal left ovary. Right ovarian complex lesion likely represents a corpus luteal cyst. Trace free pelvic fluid. IMPRESSION: Single intrauterine gestational sac with visible yolk sac but no fetal pole or cardiac activity yet visualized. Recommend follow-up quantitative B-HCG levels and follow-up US in 14 days to confirm and assess viability. This recommendation follows SRU consensus guidelines: Diagnostic Criteria for Nonviable Pregnancy Early in the First Trimester. Malva Limes Med 2013; 409:8119-14. Electronically Signed   By: Ellery Plunk M.D.   On: 04/05/2016 03:37   US Ob Transvaginal  04/03/2016  CLINICAL DATA:  Right pelvic pain for 3 weeks EXAM: OBSTETRIC <14 WK [place] AND TRANSVAGINAL OB US TECHNIQUE: Both transabdominal and transvaginal ultrasound examinations were performed for complete evaluation of the gestation as well as the maternal uterus, adnexal regions, and pelvic cul-de-sac. Transvaginal technique was  performed to assess early pregnancy. COMPARISON:  None. FINDINGS: Intrauterine gestational sac: Single Yolk sac:  No Embryo:  None Cardiac Activity: No Heart Rate:   bpm MSD: 7  mm   5 w   3  d CRL:    mm    w    d                  [place] EDC: Subchorionic hemorrhage:  None visualized. Maternal uterus/adnexae: 2.6 cm complex abnormality in the right adnexal region, probably a right ovarian corpus luteal cyst but not conclusively characterized. Normal left ovary. Small volume free pelvic fluid. IMPRESSION: Small intrauterine sac is visible, without identification of a yolk sac or fetal parts. The complex right ovarian abnormality is most likely a corpus luteal cyst, but ectopic gestation is not entirely excluded. Recommend follow-up quantitative B-HCG levels and follow-up US in 14 days to confirm and assess viability. This recommendation follows SRU consensus guidelines: Diagnostic Criteria for Nonviable Pregnancy Early in the First Trimester. Malva Limes Med 2013; 782:9562-13. Electronically Signed   By: Ellery Plunk M.D.   On: 04/03/2016 06:03    MAU Course  Procedures None  MDM UA today  Reviewed results from visit at [facility] earlier this week. Will repeat US given concern with complex abnormality near right ovary and continued RLQ abdominal pain.  CBC, quant hCG and [place] today to re-evaluate source of pain/right adnexa Patient denies N/V or fever. Appendicitis seems unlikely given patient current condition. Will advised of warning signs.  Patient was asleep when I went in the room to give discharge instructions.  Assessment and Plan  A: IUGS and YS at [redacted]w[redacted]d without FP or cardiac activity Corpus luteal cyst Abdominal pain in pregnancy  P: Discharge home Tylenol PRN for pain advised First trimester precautions discussed Patient advised to follow-up with OB  provider of choice to start prenatal care Pregnancy confirmation letter given  Patient may return to MAU as needed or if her condition were  to change or worsen   Marny LowensteinJulie N Thaxton Pelley, PA-C  04/05/2016, 3:53 AM

## 2016-04-18 ENCOUNTER — Encounter (HOSPITAL_COMMUNITY): Payer: Self-pay | Admitting: *Deleted

## 2016-04-18 ENCOUNTER — Inpatient Hospital Stay (HOSPITAL_COMMUNITY)
Admission: AD | Admit: 2016-04-18 | Discharge: 2016-04-18 | Disposition: A | Discharge status: Home / Self Care | Payer: Medicaid Other | Source: Ambulatory Visit | Attending: Family Medicine | Admitting: Family Medicine

## 2016-04-18 DIAGNOSIS — O219 Vomiting of pregnancy, unspecified: Secondary | ICD-10-CM | POA: Diagnosis not present

## 2016-04-18 DIAGNOSIS — Z202 Contact with and (suspected) exposure to infections with a predominantly sexual mode of transmission: Secondary | ICD-10-CM | POA: Diagnosis not present

## 2016-04-18 DIAGNOSIS — O210 Mild hyperemesis gravidarum: Secondary | ICD-10-CM | POA: Insufficient documentation

## 2016-04-18 DIAGNOSIS — Z3A01 Less than 8 weeks gestation of pregnancy: Secondary | ICD-10-CM | POA: Insufficient documentation

## 2016-04-18 LAB — WET PREP, GENITAL
CLUE CELLS WET PREP: NONE SEEN
SPERM: NONE SEEN
Trich, Wet Prep: NONE SEEN
Yeast Wet Prep HPF POC: NONE SEEN

## 2016-04-18 LAB — CBC
HEMATOCRIT: 38.1 % (ref 36.0–46.0)
HEMOGLOBIN: 12.7 g/dL (ref 12.0–15.0)
MCH: 30.5 pg (ref 26.0–34.0)
MCHC: 33.3 g/dL (ref 30.0–36.0)
MCV: 91.4 fL (ref 78.0–100.0)
PLATELETS: 361 10*3/uL (ref 150–400)
RBC: 4.17 MIL/uL (ref 3.87–5.11)
RDW: 12.9 % (ref 11.5–15.5)
WBC: 15.8 10*3/uL — AB (ref 4.0–10.5)

## 2016-04-18 LAB — COMPREHENSIVE METABOLIC PANEL
ALBUMIN: 3.8 g/dL (ref 3.5–5.0)
ALK PHOS: 67 U/L (ref 38–126)
ALT: 12 U/L — AB (ref 14–54)
ANION GAP: 9 (ref 5–15)
AST: 17 U/L (ref 15–41)
BUN: 7 mg/dL (ref 6–20)
CALCIUM: 8.7 mg/dL — AB (ref 8.9–10.3)
CHLORIDE: 100 mmol/L — AB (ref 101–111)
CO2: 24 mmol/L (ref 22–32)
CREATININE: 0.73 mg/dL (ref 0.44–1.00)
GFR calc Af Amer: >60 mL/min (ref >60)
GFR calc non Af Amer: >60 mL/min (ref >60)
GLUCOSE: 88 mg/dL (ref 65–99)
Potassium: 3.3 mmol/L — ABNORMAL LOW (ref 3.5–5.1)
SODIUM: 133 mmol/L — AB (ref 135–145)
Total Bilirubin: 0.5 mg/dL (ref 0.3–1.2)
Total Protein: 7.8 g/dL (ref 6.5–8.1)

## 2016-04-18 LAB — URINALYSIS, ROUTINE W REFLEX MICROSCOPIC
Bilirubin Urine: NEGATIVE
Glucose, UA: NEGATIVE mg/dL
Ketones, ur: NEGATIVE mg/dL
NITRITE: NEGATIVE
PH: 7.5 (ref 5.0–8.0)
Protein, ur: NEGATIVE mg/dL
SPECIFIC GRAVITY, URINE: 1.02 (ref 1.005–1.030)

## 2016-04-18 LAB — URINE MICROSCOPIC-ADD ON

## 2016-04-18 MED ORDER — AZITHROMYCIN 250 MG PO TABS
1000.0000 mg | ORAL_TABLET | Freq: Once | ORAL | Status: AC
  Administered 2016-04-18: 1000 mg via ORAL
  Filled 2016-04-18: qty 4

## 2016-04-18 MED ORDER — METOCLOPRAMIDE HCL 5 MG/ML IJ SOLN
10.0000 mg | Freq: Once | INTRAMUSCULAR | Status: AC
  Administered 2016-04-18: 10 mg via INTRAVENOUS
  Filled 2016-04-18: qty 2

## 2016-04-18 MED ORDER — LACTATED RINGERS IV BOLUS (SEPSIS)
1000.0000 mL | Freq: Once | INTRAVENOUS | Status: AC
  Administered 2016-04-18: 1000 mL via INTRAVENOUS

## 2016-04-18 MED ORDER — METOCLOPRAMIDE HCL 10 MG PO TABS: 10.0000 mg | ORAL_TABLET | Freq: Four times a day (QID) | ORAL | Status: DC

## 2016-04-18 NOTE — MAU Provider Note (Signed)
History     CSN: [id number]  Arrival date and time: 04/18/16 4098   First Provider Initiated Contact with Patient 04/18/16 2009      Chief Complaint  Patient presents with  . Emesis  . Exposure to STD   HPI  Jody Perez is a 25 y.o. G3P1011 at [redacted]w[redacted]d by LMP who presents with n/v, headache, & STD treatment.  Reports n/v since yesterday; worse today. Vomited 4 times today. Has only had 1 cup of water & has only voided once today. Denies diarrhea or constipation.  Headache since last night; dull frontal headache. Rates 7/10; has not treated.  Was told today but her boyfriend's girlfriend that they have chlamydia.  Denies abdominal pain, vaginal bleeding, vaginal discharge.    OB History    Gravida Para Term Preterm AB TAB SAB Ectopic Multiple Living   Past Medical History  Diagnosis Date  . Chlamydia   . BV (bacterial vaginosis)   . Trichomonas   . UTI (lower urinary tract infection)   . HSV infection   . Herpes genitalis in women   . Miscarriage     Past Surgical History  Procedure Laterality Date  . No past surgeries      Family History  Problem Relation Age of Onset  . Hypertension Mother   . Hypertension Brother     Social History  Substance Use Topics  . Smoking status: Never Smoker   . Smokeless tobacco: Never Used  . Alcohol Use: Yes     Comment: socially    Allergies: No Known Allergies  Prescriptions prior to admission  Medication Sig Dispense Refill Last Dose  . Prenatal Vit-Fe Fumarate-FA (PRENATAL MULTIVITAMIN) TABS tablet Take 1 tablet by mouth daily at 12 noon.   04/17/2016 at Unknown time    Review of Systems  Constitutional: Negative.   HENT: Negative for tinnitus.   Eyes: Negative for photophobia.  Gastrointestinal: Positive for nausea and vomiting. Negative for heartburn, abdominal pain, diarrhea and constipation.  Genitourinary: Negative.   Neurological: Positive for headaches.   Physical Exam   Blood  pressure 115/75, pulse 70, temperature 98.3 F (36.8 C), temperature source Oral, resp. rate 16, height  (1.626 m), weight 172 lb (78.019 kg), last menstrual period 02/09/2016.  Physical Exam  Nursing note and vitals reviewed. Constitutional: She is oriented to person, place, and time. She appears well-developed and well-nourished. No distress.  HENT:  Head: Normocephalic and atraumatic.  Eyes: Conjunctivae are normal. Right eye exhibits no discharge. Left eye exhibits no discharge. No scleral icterus.  Neck: Normal range of motion.  Cardiovascular: Normal rate, regular rhythm and normal heart sounds.   No murmur heard. Respiratory: Effort normal and breath sounds normal. No respiratory distress. She has no wheezes.  GI: Soft. Bowel sounds are normal. She exhibits no distension. There is no tenderness.  Neurological: She is alert and oriented to person, place, and time.  Skin: Skin is warm and dry. She is not diaphoretic.  Psychiatric: She has a normal mood and affect. Her behavior is normal. Judgment and thought content normal.    MAU Course  Procedures Results for orders placed or performed during the hospital encounter of 04/18/16 (from the past 24 hour(s))  CBC     Status: Abnormal   Collection Time: 04/18/16  8:29 PM  Result Value Ref Range   WBC 15.8 (H) 4.0 - 10.5 K/uL   RBC  4.17 3.87 - 5.11 MIL/uL   Hemoglobin 12.7 12.0 - 15.0 g/dL   HCT 16.138.1 09.636.0 - 04.546.0 %   MCV 91.4 78.0 - 100.0 fL   MCH 30.5 26.0 - 34.0 pg   MCHC 33.3 30.0 - 36.0 g/dL   RDW 40.912.9 81.111.5 - 91.415.5 %   Platelets 361 150 - 400 K/uL  Comprehensive metabolic panel     Status: Abnormal   Collection Time: 04/18/16  8:29 PM  Result Value Ref Range   Sodium 133 (L) 135 - 145 mmol/L   Potassium 3.3 (L) 3.5 - 5.1 mmol/L   Chloride 100 (L) 101 - 111 mmol/L   CO2 24 22 - 32 mmol/L   Glucose, Bld 88 65 - 99 mg/dL   BUN 7 6 - 20 mg/dL   Creatinine, Ser 7.820.73 0.44 - 1.00 mg/dL   Calcium 8.7 (L) 8.9 - 10.3 mg/dL    Total Protein 7.8 6.5 - 8.1 g/dL   Albumin 3.8 3.5 - 5.0 g/dL   AST 17 15 - 41 U/L   ALT 12 (L) 14 - 54 U/L   Alkaline Phosphatase 67 38 - 126 U/L   Total Bilirubin 0.5 0.3 - 1.2 mg/dL   GFR calc non Af Amer >60 >60 mL/min   GFR calc Af Amer >60 >60 mL/min   Anion gap 9 5 - 15  Wet prep, genital     Status: Abnormal   Collection Time: 04/18/16  8:40 PM  Result Value Ref Range   Yeast Wet Prep HPF POC NONE SEEN NONE SEEN   Trich, Wet Prep NONE SEEN NONE SEEN   Clue Cells Wet Prep HPF POC NONE SEEN NONE SEEN   WBC, Wet Prep HPF POC MODERATE (A) NONE SEEN   Sperm NONE SEEN   Urinalysis, Routine w reflex microscopic (not at [facility])     Status: Abnormal   Collection Time: 04/18/16  9:40 PM  Result Value Ref Range   Color, Urine YELLOW YELLOW   APPearance CLEAR CLEAR   Specific Gravity, Urine 1.020 1.005 - 1.030   pH 7.5 5.0 - 8.0   Glucose, UA NEGATIVE NEGATIVE mg/dL   Hgb urine dipstick SMALL (A) NEGATIVE   Bilirubin Urine NEGATIVE NEGATIVE   Ketones, ur NEGATIVE NEGATIVE mg/dL   Protein, ur NEGATIVE NEGATIVE mg/dL   Nitrite NEGATIVE NEGATIVE   Leukocytes, UA SMALL (A) NEGATIVE  Urine microscopic-add on     Status: Abnormal   Collection Time: 04/18/16  9:40 PM  Result Value Ref Range   Squamous Epithelial / LPF 6-30 (A) NONE SEEN   WBC, UA 0-5 0 - 5 WBC/hpf   RBC / HPF 0-5 0 - 5 RBC/hpf   Bacteria, UA FEW (A) NONE SEEN   Urine-Other MUCOUS PRESENT     MDM IV fluid bolus with LR Reglan 10 mg IV Azithromycin 1 gm PO Pt reports improvement in symptoms & requesting to go home Pt able to void prior to discharge  Assessment and Plan  A: 1. Vomiting pregnancy   2. STD exposure    P: Discharge home Rx reglan GC/CT, HIV, RPR pending Discussed reasons to return to MAU Start prenatal care  Jody Perez 04/18/2016, 8:08 PM

## 2016-04-18 NOTE — Discharge Instructions (Signed)

## 2016-04-18 NOTE — MAU Note (Signed)
Throwing up this morning, noted blood.  Has a bad HA, can't keep nothing down. (has not thrown up since arrived), still unable to void.  Received call this morning from boyfriend's girlfriend, that she had chlamydia, pt is wanting to be tested.

## 2016-04-19 LAB — GC/CHLAMYDIA PROBE AMP (~~LOC~~) NOT AT ARMC
CHLAMYDIA, DNA PROBE: NEGATIVE
NEISSERIA GONORRHEA: NEGATIVE

## 2016-04-19 LAB — HIV ANTIBODY (ROUTINE TESTING W REFLEX): HIV SCREEN 4TH GENERATION: NONREACTIVE

## 2016-04-19 LAB — RPR: RPR Ser Ql: NONREACTIVE

## 2016-05-03 ENCOUNTER — Ambulatory Visit (INDEPENDENT_AMBULATORY_CARE_PROVIDER_SITE_OTHER): Payer: Medicaid Other | Admitting: Obstetrics & Gynecology

## 2016-05-03 ENCOUNTER — Encounter: Payer: Self-pay | Admitting: Obstetrics & Gynecology

## 2016-05-03 ENCOUNTER — Encounter: Payer: Self-pay | Admitting: *Deleted

## 2016-05-03 ENCOUNTER — Other Ambulatory Visit (HOSPITAL_COMMUNITY)
Admission: RE | Admit: 2016-05-03 | Discharge: 2016-05-03 | Disposition: A | Discharge status: Home / Self Care | Payer: Medicaid Other | Source: Ambulatory Visit | Attending: Obstetrics & Gynecology | Admitting: Obstetrics & Gynecology

## 2016-05-03 VITALS — BP 114/72 | HR 94 | Wt 174.0 lb

## 2016-05-03 DIAGNOSIS — Z8619 Personal history of other infectious and parasitic diseases: Secondary | ICD-10-CM

## 2016-05-03 DIAGNOSIS — Z3491 Encounter for supervision of normal pregnancy, unspecified, first trimester: Secondary | ICD-10-CM

## 2016-05-03 DIAGNOSIS — Z01411 Encounter for gynecological examination (general) (routine) with abnormal findings: Secondary | ICD-10-CM | POA: Diagnosis present

## 2016-05-03 DIAGNOSIS — B379 Candidiasis, unspecified: Secondary | ICD-10-CM

## 2016-05-03 DIAGNOSIS — Z3490 Encounter for supervision of normal pregnancy, unspecified, unspecified trimester: Secondary | ICD-10-CM | POA: Insufficient documentation

## 2016-05-03 DIAGNOSIS — Z36 Encounter for antenatal screening of mother: Secondary | ICD-10-CM

## 2016-05-03 DIAGNOSIS — Z3481 Encounter for supervision of other normal pregnancy, first trimester: Secondary | ICD-10-CM | POA: Diagnosis not present

## 2016-05-03 MED ORDER — VALACYCLOVIR HCL 500 MG PO TABS: 500.0000 mg | ORAL_TABLET | Freq: Two times a day (BID) | ORAL | Status: DC

## 2016-05-03 MED ORDER — TERCONAZOLE 0.4 % VA CREA: 1.0000 | TOPICAL_CREAM | Freq: Every day | VAGINAL | Status: DC

## 2016-05-03 NOTE — Patient Instructions (Signed)
 First Trimester of Pregnancy The first trimester of pregnancy is from week 1 until the end of week 12 (months 1 through 3). A week after a sperm fertilizes an egg, the egg will implant on the wall of the uterus. This embryo will begin to develop into a baby. Genes from you and your partner are forming the baby. The female genes determine whether the baby is a boy or a girl. At 6-8 weeks, the eyes and face are formed, and the heartbeat can be seen on ultrasound. At the end of 12 weeks, all the baby's organs are formed.  Now that you are pregnant, you will want to do everything you can to have a healthy baby. Two of the most important things are to get good prenatal care and to follow your health care provider's instructions. Prenatal care is all the medical care you receive before the baby's birth. This care will help prevent, find, and treat any problems during the pregnancy and childbirth. BODY CHANGES Your body goes through many changes during pregnancy. The changes vary from woman to woman.   You may gain or lose a couple of pounds at first.  You may feel sick to your stomach (nauseous) and throw up (vomit). If the vomiting is uncontrollable, call your health care provider.  You may tire easily.  You may develop headaches that can be relieved by medicines approved by your health care provider.  You may urinate more often. Painful urination may mean you have a bladder infection.  You may develop heartburn as a result of your pregnancy.  You may develop constipation because certain hormones are causing the muscles that push waste through your intestines to slow down.  You may develop hemorrhoids or swollen, bulging veins (varicose veins).  Your breasts may begin to grow larger and become tender. Your nipples may stick out more, and the tissue that surrounds them (areola) may become darker.  Your gums may bleed and may be sensitive to brushing and flossing.  Dark spots or blotches  (chloasma, mask of pregnancy) may develop on your face. This will likely fade after the baby is born.  Your menstrual periods will stop.  You may have a loss of appetite.  You may develop cravings for certain kinds of food.  You may have changes in your emotions from day to day, such as being excited to be pregnant or being concerned that something may go wrong with the pregnancy and baby.  You may have more vivid and strange dreams.  You may have changes in your hair. These can include thickening of your hair, rapid growth, and changes in texture. Some women also have hair loss during or after pregnancy, or hair that feels dry or thin. Your hair will most likely return to normal after your baby is born. WHAT TO EXPECT AT YOUR PRENATAL VISITS During a routine prenatal visit:  You will be weighed to make sure you and the baby are growing normally.  Your blood pressure will be taken.  Your abdomen will be measured to track your baby's growth.  The fetal heartbeat will be listened to starting around week 10 or 12 of your pregnancy.  Test results from any previous visits will be discussed. Your health care provider may ask you:  How you are feeling.  If you are feeling the baby move.  If you have had any abnormal symptoms, such as leaking fluid, bleeding, severe headaches, or abdominal cramping.  If you are using any tobacco   products, including cigarettes, chewing tobacco, and electronic cigarettes.  If you have any questions. Other tests that may be performed during your first trimester include:  Blood tests to find your blood type and to check for the presence of any previous infections. They will also be used to check for low iron levels (anemia) and Rh antibodies. Later in the pregnancy, blood tests for diabetes will be done along with other tests if problems develop.  Urine tests to check for infections, diabetes, or protein in the urine.  An ultrasound to confirm the  proper growth and development of the baby.  An amniocentesis to check for possible genetic problems.  Fetal screens for spina bifida and Down syndrome.  You may need other tests to make sure you and the baby are doing well.  HIV (human immunodeficiency virus) testing. Routine prenatal testing includes screening for HIV, unless you choose not to have this test. HOME CARE INSTRUCTIONS  Medicines  Follow your health care provider's instructions regarding medicine use. Specific medicines may be either safe or unsafe to take during pregnancy.  Take your prenatal vitamins as directed.  If you develop constipation, try taking a stool softener if your health care provider approves. Diet  Eat regular, well-balanced meals. Choose a variety of foods, such as meat or vegetable-based protein, fish, milk and low-fat dairy products, vegetables, fruits, and whole grain breads and cereals. Your health care provider will help you determine the amount of weight gain that is right for you.  Avoid raw meat and uncooked cheese. These carry germs that can cause birth defects in the baby.  Eating four or five small meals rather than three large meals a day may help relieve nausea and vomiting. If you start to feel nauseous, eating a few soda crackers can be helpful. Drinking liquids between meals instead of during meals also seems to help nausea and vomiting.  If you develop constipation, eat more high-fiber foods, such as fresh vegetables or fruit and whole grains. Drink enough fluids to keep your urine clear or pale yellow. Activity and Exercise  Exercise only as directed by your health care provider. Exercising will help you:  Control your weight.  Stay in shape.  Be prepared for labor and delivery.  Experiencing pain or cramping in the lower abdomen or low back is a good sign that you should stop exercising. Check with your health care provider before continuing normal exercises.  Try to avoid  standing for long periods of time. Move your legs often if you must stand in one place for a long time.  Avoid heavy lifting.  Wear low-heeled shoes, and practice good posture.  You may continue to have sex unless your health care provider directs you otherwise. Relief of Pain or Discomfort  Wear a good support bra for breast tenderness.   Take warm sitz baths to soothe any pain or discomfort caused by hemorrhoids. Use hemorrhoid cream if your health care provider approves.   Rest with your legs elevated if you have leg cramps or low back pain.  If you develop varicose veins in your legs, wear support hose. Elevate your feet for 15 minutes, 3-4 times a day. Limit salt in your diet. Prenatal Care  Schedule your prenatal visits by the twelfth week of pregnancy. They are usually scheduled monthly at first, then more often in the last 2 months before delivery.  Write down your questions. Take them to your prenatal visits.  Keep all your prenatal visits as directed by   your health care provider. Safety  Wear your seat belt at all times when driving.  Make a list of emergency phone numbers, including numbers for family, friends, the hospital, and police and fire departments. General Tips  Ask your health care provider for a referral to a local prenatal education class. Begin classes no later than at the beginning of month 6 of your pregnancy.  Ask for help if you have counseling or nutritional needs during pregnancy. Your health care provider can offer advice or refer you to specialists for help with various needs.  Do not use hot tubs, steam rooms, or saunas.  Do not douche or use tampons or scented sanitary pads.  Do not cross your legs for long periods of time.  Avoid cat litter boxes and soil used by cats. These carry germs that can cause birth defects in the baby and possibly loss of the fetus by miscarriage or stillbirth.  Avoid all smoking, herbs, alcohol, and medicines  not prescribed by your health care provider. Chemicals in these affect the formation and growth of the baby.  Do not use any tobacco products, including cigarettes, chewing tobacco, and electronic cigarettes. If you need help quitting, ask your health care provider. You may receive counseling support and other resources to help you quit.  Schedule a dentist appointment. At home, brush your teeth with a soft toothbrush and be gentle when you floss. SEEK MEDICAL CARE IF:   You have dizziness.  You have mild pelvic cramps, pelvic pressure, or nagging pain in the abdominal area.  You have persistent nausea, vomiting, or diarrhea.  You have a bad smelling vaginal discharge.  You have pain with urination.  You notice increased swelling in your face, hands, legs, or ankles. SEEK IMMEDIATE MEDICAL CARE IF:   You have a fever.  You are leaking fluid from your vagina.  You have spotting or bleeding from your vagina.  You have severe abdominal cramping or pain.  You have rapid weight gain or loss.  You vomit blood or material that looks like coffee grounds.  You are exposed to German measles and have never had them.  You are exposed to fifth disease or chickenpox.  You develop a severe headache.  You have shortness of breath.  You have any kind of trauma, such as from a fall or a car accident.   This information is not intended to replace advice given to you by your health care provider. Make sure you discuss any questions you have with your health care provider.   Document Released: 11/07/2001 Document Revised: 12/04/2014 Document Reviewed: 09/23/2013 Elsevier Interactive Patient Education 2016 [employer]   Breastfeeding Deciding to breastfeed is one of the best choices you can make for you and your baby. A change in hormones during pregnancy causes your breast tissue to grow and increases the number and size of your milk ducts. These hormones also allow proteins, sugars,  and fats from your blood supply to make breast milk in your milk-producing glands. Hormones prevent breast milk from being released before your baby is born as well as prompt milk flow after birth. Once breastfeeding has begun, thoughts of your baby, as well as his or her sucking or crying, can stimulate the release of milk from your milk-producing glands.  BENEFITS OF BREASTFEEDING For Your Baby  Your first milk (colostrum) helps your baby's digestive system function better.  There are antibodies in your milk that help your baby fight off infections.  Your baby has   a lower incidence of asthma, allergies, and sudden infant death syndrome.  The nutrients in breast milk are better for your baby than infant formulas and are designed uniquely for your baby's needs.  Breast milk improves your baby's brain development.  Your baby is less likely to develop other conditions, such as childhood obesity, asthma, or type 2 diabetes mellitus. For You  Breastfeeding helps to create a very special bond between you and your baby.  Breastfeeding is convenient. Breast milk is always available at the correct temperature and costs nothing.  Breastfeeding helps to burn calories and helps you lose the weight gained during pregnancy.  Breastfeeding makes your uterus contract to its prepregnancy size faster and slows bleeding (lochia) after you give birth.   Breastfeeding helps to lower your risk of developing type 2 diabetes mellitus, osteoporosis, and breast or ovarian cancer later in life. SIGNS THAT YOUR BABY IS HUNGRY Early Signs of Hunger  Increased alertness or activity.  Stretching.  Movement of the head from side to side.  Movement of the head and opening of the mouth when the corner of the mouth or cheek is stroked (rooting).  Increased sucking sounds, smacking lips, cooing, sighing, or squeaking.  Hand-to-mouth movements.  Increased sucking of fingers or hands. Late Signs of  Hunger  Fussing.  Intermittent crying. Extreme Signs of Hunger Signs of extreme hunger will require calming and consoling before your baby will be able to breastfeed successfully. Do not wait for the following signs of extreme hunger to occur before you initiate breastfeeding:  Restlessness.  A loud, Garoutte cry.  Screaming. BREASTFEEDING BASICS Breastfeeding Initiation  Find a comfortable place to sit or lie down, with your neck and back well supported.  Place a pillow or rolled up blanket under your baby to bring him or her to the level of your breast (if you are seated). Nursing pillows are specially designed to help support your arms and your baby while you breastfeed.  Make sure that your baby's abdomen is facing your abdomen.  Gently massage your breast. With your fingertips, massage from your chest wall toward your nipple in a circular motion. This encourages milk flow. You may need to continue this action during the feeding if your milk flows slowly.  Support your breast with 4 fingers underneath and your thumb above your nipple. Make sure your fingers are well away from your nipple and your baby's mouth.  Stroke your baby's lips gently with your finger or nipple.  When your baby's mouth is open wide enough, quickly bring your baby to your breast, placing your entire nipple and as much of the colored area around your nipple (areola) as possible into your baby's mouth.  More areola should be visible above your baby's upper lip than below the lower lip.  Your baby's tongue should be between his or her lower gum and your breast.  Ensure that your baby's mouth is correctly positioned around your nipple (latched). Your baby's lips should create a seal on your breast and be turned out (everted).  It is common for your baby to suck about 2-3 minutes in order to start the flow of breast milk. Latching Teaching your baby how to latch on to your breast properly is very important.  An improper latch can cause nipple pain and decreased milk supply for you and poor weight gain in your baby. Also, if your baby is not latched onto your nipple properly, he or she may swallow some air during feeding. This   can make your baby fussy. Burping your baby when you switch breasts during the feeding can help to get rid of the air. However, teaching your baby to latch on properly is still the best way to prevent fussiness from swallowing air while breastfeeding. Signs that your baby has successfully latched on to your nipple:  Silent tugging or silent sucking, without causing you pain.  Swallowing heard between every 3-4 sucks.  Muscle movement above and in front of his or her ears while sucking. Signs that your baby has not successfully latched on to nipple:  Sucking sounds or smacking sounds from your baby while breastfeeding.  Nipple pain. If you think your baby has not latched on correctly, slip your finger into the corner of your baby's mouth to break the suction and place it between your baby's gums. Attempt breastfeeding initiation again. Signs of Successful Breastfeeding Signs from your baby:  A gradual decrease in the number of sucks or complete cessation of sucking.  Falling asleep.  Relaxation of his or her body.  Retention of a small amount of milk in his or her mouth.  Letting go of your breast by himself or herself. Signs from you:  Breasts that have increased in firmness, weight, and size 1-3 hours after feeding.  Breasts that are softer immediately after breastfeeding.  Increased milk volume, as well as a change in milk consistency and color by the fifth day of breastfeeding.  Nipples that are not sore, cracked, or bleeding. Signs That Your Baby is Getting Enough Milk  Wetting at least 3 diapers in a 24-hour period. The urine should be clear and pale yellow by age 5 days.  At least 3 stools in a 24-hour period by age 5 days. The stool should be soft and  yellow.  At least 3 stools in a 24-hour period by age 7 days. The stool should be seedy and yellow.  No loss of weight greater than 10% of birth weight during the first 3 days of age.  Average weight gain of 4-7 ounces (113-198 g) per week after age 4 days.  Consistent daily weight gain by age 5 days, without weight loss after the age of 2 weeks. After a feeding, your baby may spit up a small amount. This is common. BREASTFEEDING FREQUENCY AND DURATION Frequent feeding will help you make more milk and can prevent sore nipples and breast engorgement. Breastfeed when you feel the need to reduce the fullness of your breasts or when your baby shows signs of hunger. This is called "breastfeeding on demand." Avoid introducing a pacifier to your baby while you are working to establish breastfeeding (the first 4-6 weeks after your baby is born). After this time you may choose to use a pacifier. Research has shown that pacifier use during the first year of a baby's life decreases the risk of sudden infant death syndrome (SIDS). Allow your baby to feed on each breast as long as he or she wants. Breastfeed until your baby is finished feeding. When your baby unlatches or falls asleep while feeding from the first breast, offer the second breast. Because newborns are often sleepy in the first few weeks of life, you may need to awaken your baby to get him or her to feed. Breastfeeding times will vary from baby to baby. However, the following rules can serve as a guide to help you ensure that your baby is properly fed:  Newborns (babies 4 weeks of age or younger) may breastfeed every 1-3 hours.    Newborns should not go longer than 3 hours during the day or 5 hours during the night without breastfeeding.  You should breastfeed your baby a minimum of 8 times in a 24-hour period until you begin to introduce solid foods to your baby at around 6 months of age. BREAST MILK PUMPING Pumping and storing breast milk  allows you to ensure that your baby is exclusively fed your breast milk, even at times when you are unable to breastfeed. This is especially important if you are going back to work while you are still breastfeeding or when you are not able to be present during feedings. Your lactation consultant can give you guidelines on how long it is safe to store breast milk. A breast pump is a machine that allows you to pump milk from your breast into a sterile bottle. The pumped breast milk can then be stored in a refrigerator or freezer. Some breast pumps are operated by hand, while others use electricity. Ask your lactation consultant which type will work best for you. Breast pumps can be purchased, but some hospitals and breastfeeding support groups lease breast pumps on a monthly basis. A lactation consultant can teach you how to hand express breast milk, if you prefer not to use a pump. CARING FOR YOUR BREASTS WHILE YOU BREASTFEED Nipples can become dry, cracked, and sore while breastfeeding. The following recommendations can help keep your breasts moisturized and healthy:  Avoid using soap on your nipples.  Wear a supportive bra. Although not required, special nursing bras and tank tops are designed to allow access to your breasts for breastfeeding without taking off your entire bra or top. Avoid wearing underwire-style bras or extremely tight bras.  Air dry your nipples for 3-4minutes after each feeding.  Use only cotton bra pads to absorb leaked breast milk. Leaking of breast milk between feedings is normal.  Use lanolin on your nipples after breastfeeding. Lanolin helps to maintain your skin's normal moisture barrier. If you use pure lanolin, you do not need to wash it off before feeding your baby again. Pure lanolin is not toxic to your baby. You may also hand express a few drops of breast milk and gently massage that milk into your nipples and allow the milk to air dry. In the first few weeks after  giving birth, some women experience extremely full breasts (engorgement). Engorgement can make your breasts feel heavy, warm, and tender to the touch. Engorgement peaks within 3-5 days after you give birth. The following recommendations can help ease engorgement:  Completely empty your breasts while breastfeeding or pumping. You may want to start by applying warm, moist heat (in the shower or with warm water-soaked hand towels) just before feeding or pumping. This increases circulation and helps the milk flow. If your baby does not completely empty your breasts while breastfeeding, pump any extra milk after he or she is finished.  Wear a snug bra (nursing or regular) or tank top for 1-2 days to signal your body to slightly decrease milk production.  Apply ice packs to your breasts, unless this is too uncomfortable for you.  Make sure that your baby is latched on and positioned properly while breastfeeding. If engorgement persists after 48 hours of following these recommendations, contact your health care provider or a lactation consultant. OVERALL HEALTH CARE RECOMMENDATIONS WHILE BREASTFEEDING  Eat healthy foods. Alternate between meals and snacks, eating 3 of each per day. Because what you eat affects your breast milk, some of the foods   may make your baby more irritable than usual. Avoid eating these foods if you are sure that they are negatively affecting your baby.  Drink milk, fruit juice, and water to satisfy your thirst (about 10 glasses a day).  Rest often, relax, and continue to take your prenatal vitamins to prevent fatigue, stress, and anemia.  Continue breast self-awareness checks.  Avoid chewing and smoking tobacco. Chemicals from cigarettes that pass into breast milk and exposure to secondhand smoke may harm your baby.  Avoid alcohol and drug use, including marijuana. Some medicines that may be harmful to your baby can pass through breast milk. It is important to ask your health  care provider before taking any medicine, including all over-the-counter and prescription medicine as well as vitamin and herbal supplements. It is possible to become pregnant while breastfeeding. If birth control is desired, ask your health care provider about options that will be safe for your baby. SEEK MEDICAL CARE IF:  You feel like you want to stop breastfeeding or have become frustrated with breastfeeding.  You have painful breasts or nipples.  Your nipples are cracked or bleeding.  Your breasts are red, tender, or warm.  You have a swollen area on either breast.  You have a fever or chills.  You have nausea or vomiting.  You have drainage other than breast milk from your nipples.  Your breasts do not become full before feedings by the fifth day after you give birth.  You feel sad and depressed.  Your baby is too sleepy to eat well.  Your baby is having trouble sleeping.   Your baby is wetting less than 3 diapers in a 24-hour period.  Your baby has less than 3 stools in a 24-hour period.  Your baby's skin or the white part of his or her eyes becomes yellow.   Your baby is not gaining weight by 5 days of age. SEEK IMMEDIATE MEDICAL CARE IF:  Your baby is overly tired (lethargic) and does not want to wake up and feed.  Your baby develops an unexplained fever.   This information is not intended to replace advice given to you by your health care provider. Make sure you discuss any questions you have with your health care provider.   Document Released: 11/13/2005 Document Revised: 08/04/2015 Document Reviewed: 05/07/2013 Elsevier Interactive Patient Education 2016 [employer]  

## 2016-05-03 NOTE — Progress Notes (Signed)
Bedside US shows single IUP with CRL measuring [id number] consistent with previous US dates and FHR 168.

## 2016-05-03 NOTE — Progress Notes (Signed)
Subjective:    Jody Perez is a 25 y.o. G3P1011 at 139w5d by 5 week scan not consistent with LMP being seen today for her first obstetrical visit.  Her obstetrical history is significant for term SVD, and history of genital HSV. Patient does intend to breast feed. Pregnancy history fully reviewed.  Patient reports no complaints.  Desires HSV suppression throughout pregnancy, had last outbreak 3-4 months ago.  There were no vitals filed for this visit.  HISTORY: OB History  Gravida Para Term Preterm AB SAB TAB Ectopic Multiple Living  3 1 1  1 1    1     # Outcome Date GA Lbr Len/2nd Weight Sex Delivery Anes PTL Lv  3 Current           2 Term 08/18/13 1244w3d 18:30 / 04:10 6 lb 2.1 oz (2.78 kg) F Vag-Spont EPI  Y  1 SAB 2013             Comments: System Generated. Please review and update pregnancy details.     Past Medical History  Diagnosis Date  . Chlamydia   . BV (bacterial vaginosis)   . Trichomonas   . UTI (lower urinary tract infection)   . HSV infection   . Herpes genitalis in women   . Miscarriage    Past Surgical History  Procedure Laterality Date  . No past surgeries     Family History  Problem Relation Age of Onset  . Hypertension Mother   . Hypertension Brother      Exam    Uterus:     Pelvic Exam:    Perineum: No Hemorrhoids, Normal Perineum   Vulva: normal   Vagina:  normal mucosa, curdlike discharge, wet prep obtained   Cervix: multiparous appearance, scant bleeding following Pap, no cervical motion tenderness and no lesions   Adnexa: not evaluated   Bony Pelvis: average  System: Breast:  normal appearance, no masses or tenderness   Skin: normal coloration and turgor, no rashes    Neurologic: oriented, normal, negative   Extremities: normal strength, tone, and muscle mass, ROM of all joints is normal   HEENT PERRLA, extra ocular movement intact and sclera clear, anicteric   Mouth/Teeth mucous membranes moist, pharynx normal without lesions and  dental hygiene good   Neck supple and no masses   Cardiovascular: regular rate and rhythm   Respiratory:  appears well, vitals normal, no respiratory distress, acyanotic, normal RR, chest clear, no wheezing, crepitations, rhonchi, normal symmetric air entry   Abdomen: soft, non-tender; bowel sounds normal; no masses,  no organomegaly   Urinary: urethral meatus normal   +FHR on u/s   Assessment:    Pregnancy: G3P1011 Patient Active Problem List   Diagnosis Date Noted  . Supervision of normal pregnancy 05/03/2016  . History of genital HSV 01/28/2013     Plan:   1. History of genital HSV Suppression ordered - valACYclovir (VALTREX) 500 MG tablet; Take 1 tablet (500 mg total) by mouth 2 (two) times daily.  Dispense: 60 tablet; Refill: 6  2. Yeast infection - terconazole (TERAZOL 7) 0.4 % vaginal cream; Place 1 applicator vaginally at bedtime. Use for seven days  Dispense: 45 g; Refill: 3 Will follow up wet prep also and manage accordingly. 3. Supervision of normal pregnancy, first trimester - US bedside; Future - US MFM Fetal Nuchal Translucency; Future - Prenatal Profile - Culture, OB Urine - WET PREP BY MOLECULAR PROBE - Cytology - PAP Initial labs drawn. Continue  Prenatal vitamins. Problem list reviewed and updated. Genetic Screening discussed Integrated Screen: ordered. Ultrasound discussed; fetal survey: to be ordered later. The nature of [facility] with multiple MDs and other Advanced Practice Providers was explained to patient; also emphasized that residents, students are part of our team. Follow up in 4 weeks.   Tereso Newcomer, MD 05/03/2016

## 2016-05-04 LAB — PRENATAL PROFILE (SOLSTAS)
Antibody Screen: NEGATIVE
BASOS ABS: 0 {cells}/uL (ref 0–200)
Basophils Relative: 0 %
EOS ABS: 238 {cells}/uL (ref 15–500)
Eosinophils Relative: 2 %
HEMATOCRIT: 36.7 % (ref 35.0–45.0)
HEMOGLOBIN: 12.1 g/dL (ref 11.7–15.5)
HIV 1&2 Ab, 4th Generation: NONREACTIVE
Hepatitis B Surface Ag: NEGATIVE
Lymphocytes Relative: 19 %
Lymphs Abs: 2261 cells/uL (ref 850–3900)
MCH: 30.3 pg (ref 27.0–33.0)
MCHC: 33 g/dL (ref 32.0–36.0)
MCV: 91.8 fL (ref 80.0–100.0)
MONOS PCT: 5 %
MPV: 10.3 fL (ref 7.5–12.5)
Monocytes Absolute: 595 cells/uL (ref 200–950)
NEUTROS ABS: 8806 {cells}/uL — AB (ref 1500–7800)
NEUTROS PCT: 74 %
Platelets: 342 10*3/uL (ref 140–400)
RBC: 4 MIL/uL (ref 3.80–5.10)
RDW: 13.3 % (ref 11.0–15.0)
RUBELLA: 3.95 {index} — AB (ref <0.90)
Rh Type: POSITIVE
WBC: 11.9 10*3/uL — ABNORMAL HIGH (ref 3.8–10.8)

## 2016-05-04 LAB — CYTOLOGY - PAP

## 2016-05-04 LAB — WET PREP BY MOLECULAR PROBE
Candida species: NEGATIVE
GARDNERELLA VAGINALIS: NEGATIVE
TRICHOMONAS VAG: NEGATIVE

## 2016-05-05 LAB — CULTURE, OB URINE: Colony Count: 30000

## 2016-05-08 ENCOUNTER — Encounter: Payer: Self-pay | Admitting: *Deleted

## 2016-05-16 ENCOUNTER — Encounter (HOSPITAL_COMMUNITY): Payer: Self-pay | Admitting: Obstetrics & Gynecology

## 2016-05-24 ENCOUNTER — Ambulatory Visit (HOSPITAL_COMMUNITY)
Admission: RE | Admit: 2016-05-24 | Discharge: 2016-05-24 | Disposition: A | Discharge status: Home / Self Care | Payer: Medicaid Other | Source: Ambulatory Visit | Attending: Obstetrics & Gynecology | Admitting: Obstetrics & Gynecology

## 2016-05-24 ENCOUNTER — Encounter (HOSPITAL_COMMUNITY): Payer: Self-pay

## 2016-05-24 DIAGNOSIS — Z3A12 12 weeks gestation of pregnancy: Secondary | ICD-10-CM | POA: Insufficient documentation

## 2016-05-24 DIAGNOSIS — Z3491 Encounter for supervision of normal pregnancy, unspecified, first trimester: Secondary | ICD-10-CM | POA: Insufficient documentation

## 2016-05-24 DIAGNOSIS — Z36 Encounter for antenatal screening of mother: Secondary | ICD-10-CM | POA: Insufficient documentation

## 2016-05-24 IMAGING — US US MFM FETAL NUCHAL TRANSLUCENCY
1 series · 15 of 28 positions shown · non-contrast
Comparison: none

[Series 1: us mfm fetal nuchal translucency · 15 of 36 slices shown]
[im 1/36]
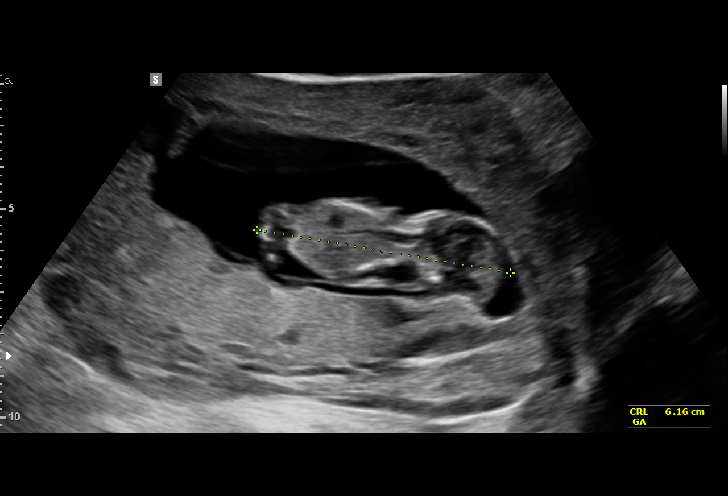
[im 3/36]
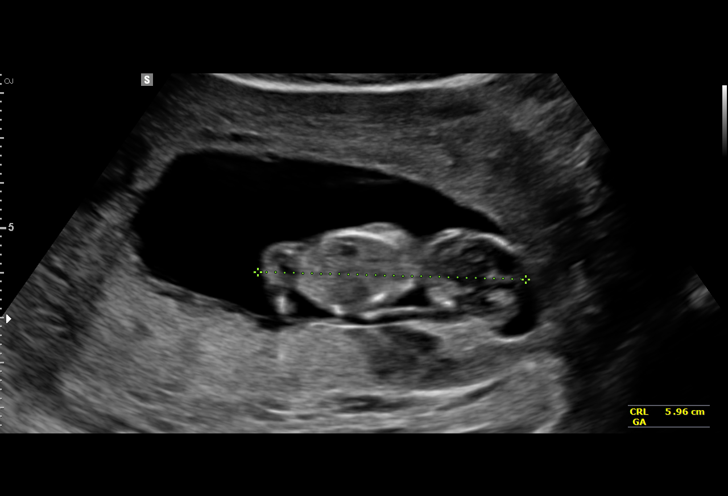
[im 6/36]
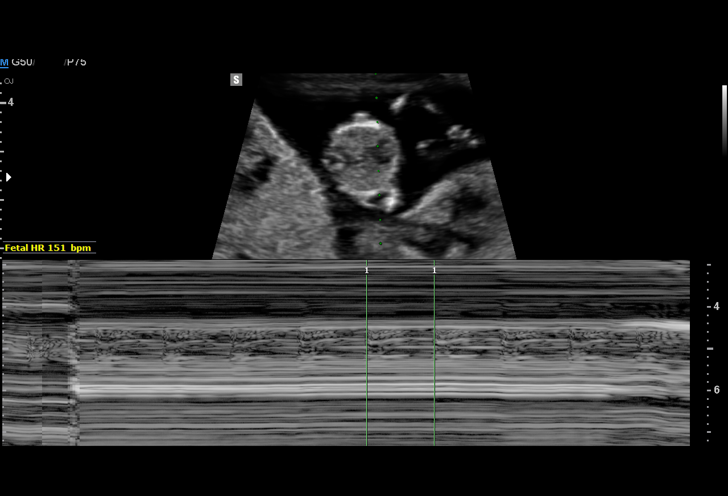
[im 8/36]
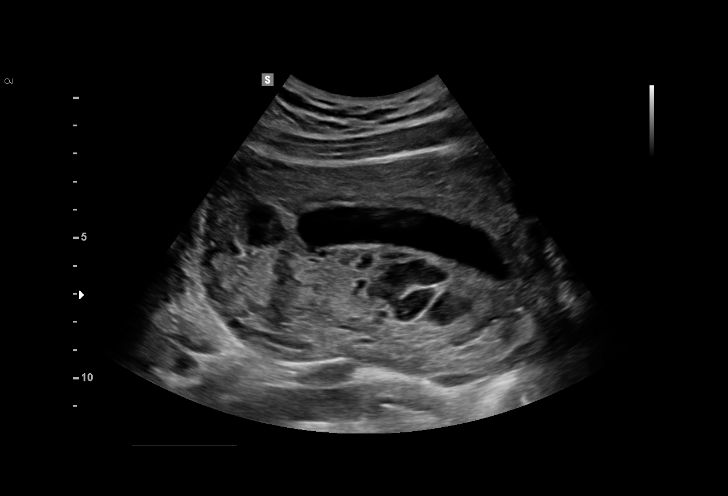
[im 11/36]
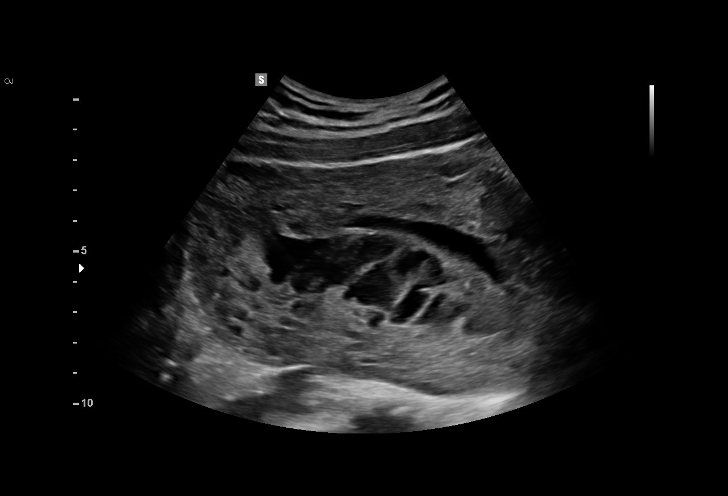
[im 13/36]
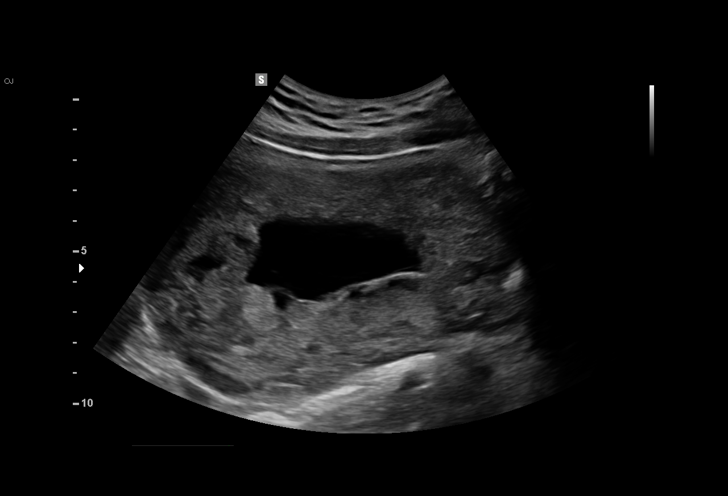
[im 16/36]
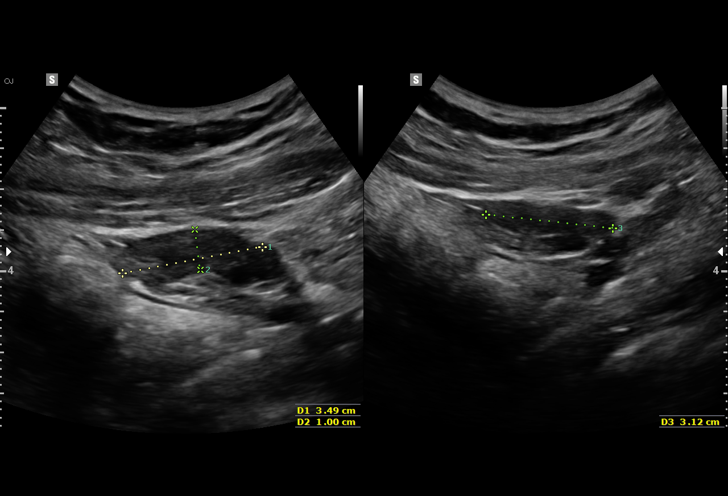
[im 19/36]
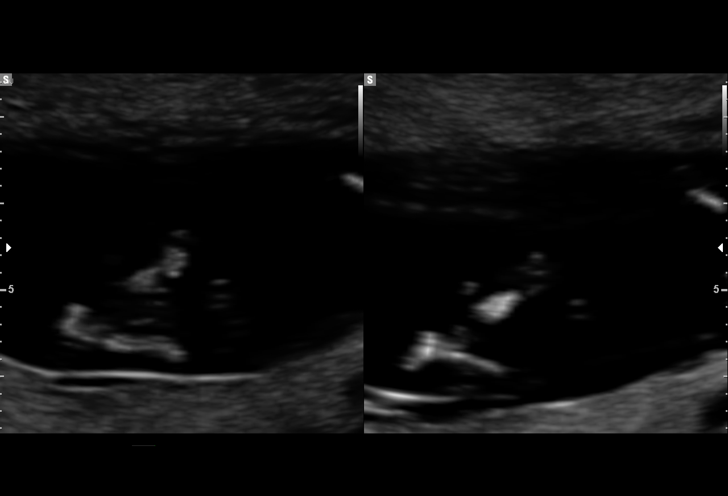
[im 20/36]
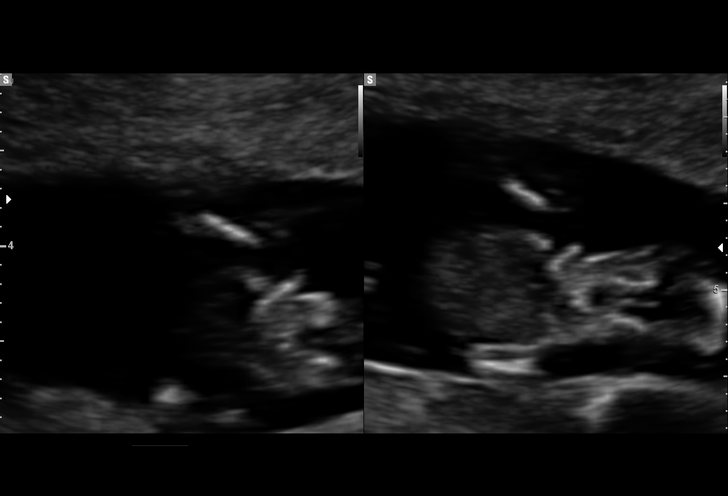
[im 23/36]
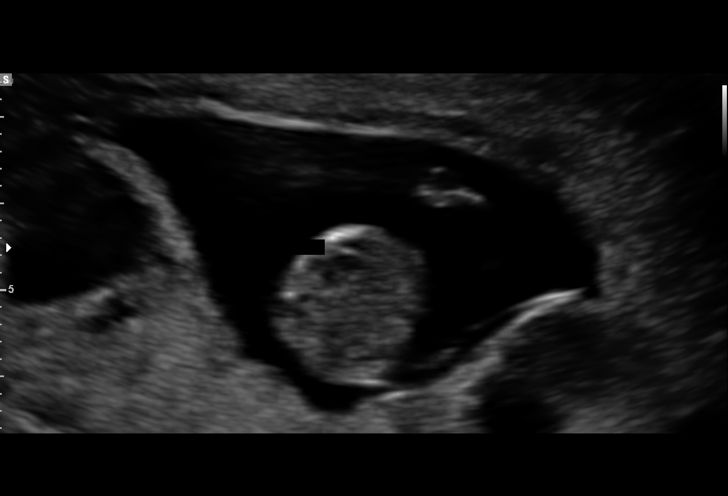
[im 25/36]
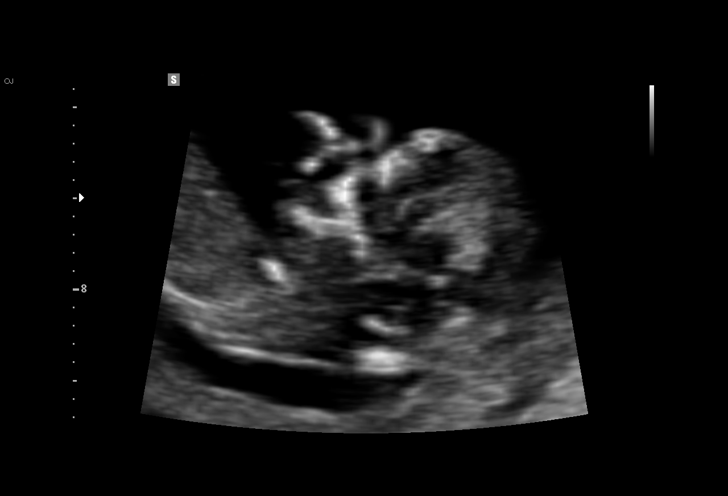
[im 28/36]
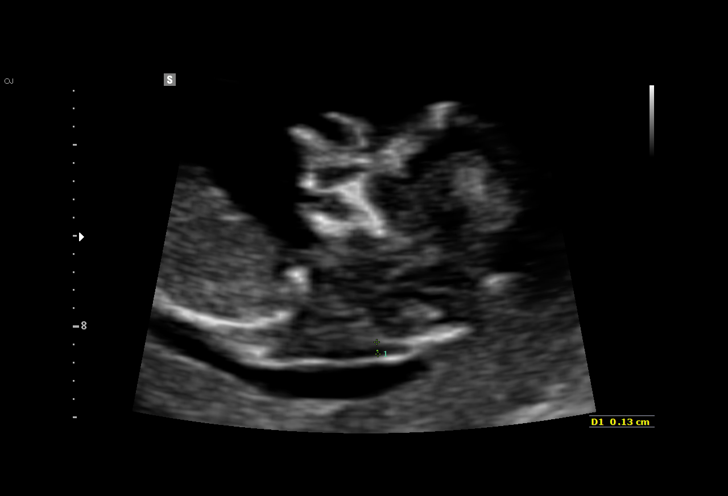
[im 30/36]
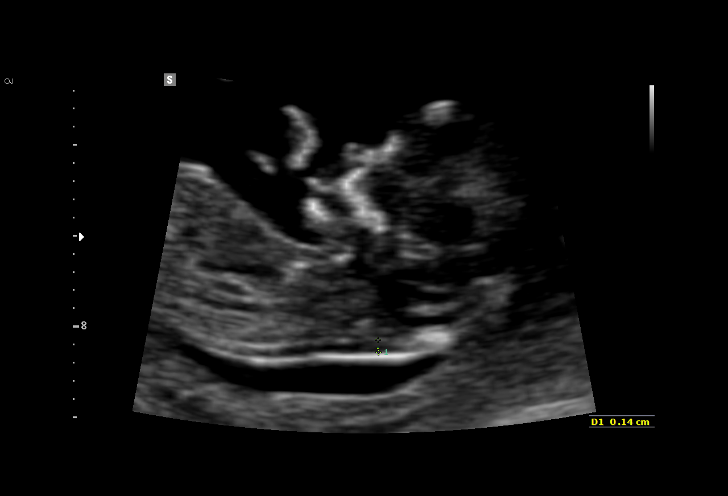
[im 33/36]
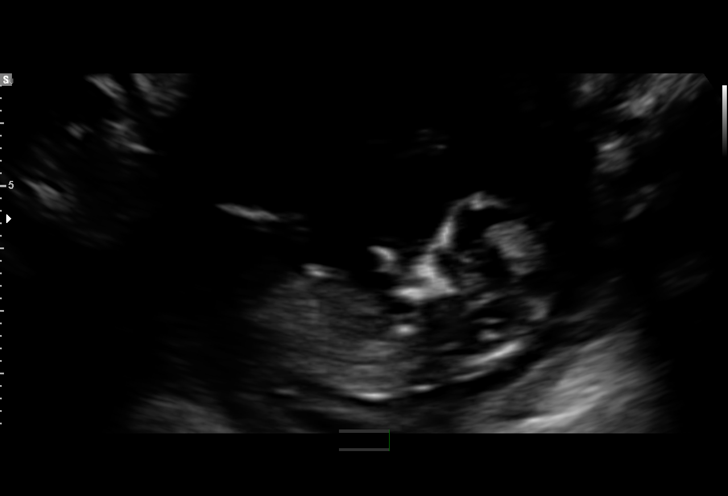
[im 36/36]
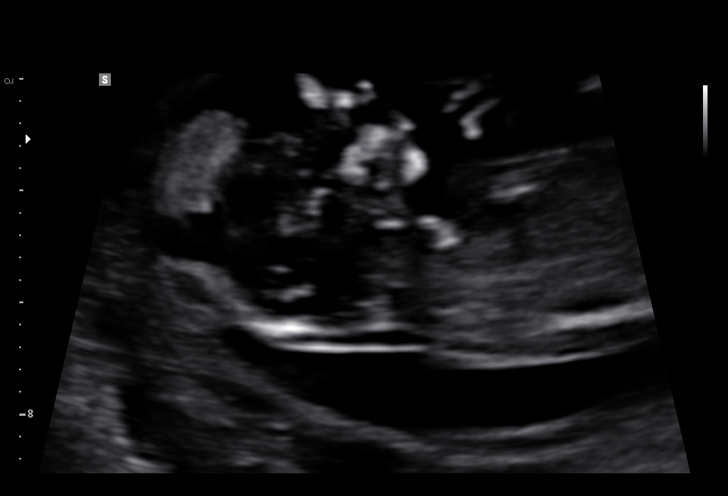

[15 of 28 positions shown; findings below may reference images not displayed]

TRANSLUCENCY

1  ORDUNO           [PHONE_NUMBER]      [PHONE_NUMBER]     [PHONE_NUMBER]
Indications

12 weeks gestation of pregnancy
First trimester aneuploidy screen (NT)         Z36
Fetal Evaluation

Num Of Fetuses:     1
Preg. Location:     Intrauterine
Gest. Sac:          Intrauterine
Fetal Pole:         Visualized
Fetal Heart         151
Rate(bpm):
Cardiac Activity:   Observed
Biometry

CRL:      60.5  mm     G. Age:  12w 2d                   EDD:   [DATE]
Gestational Age

LMP:           15w 0d        Date:  [DATE]                 EDD:    [DATE]
1st Trimester Genetic Sonogram Screening

CRL:            60.5  mm    G. Age:   12w 2d                 EDD:    [DATE]
Nuc Trans:       1.3  mm
Nasal Bone:                 Present
Anatomy
Cranium:               Appears normal         Cord Vessels:           Appears normal (3
vessel cord)
Stomach:               Appears normal, left   Bladder:                Appears normal
sided
Abdomen:               Appears normal
Impression

SIUP at 12+2 weeks
No gross abnormalities identified
NT measurement was within normal limits for this GA; NB
present
Normal amniotic fluid volume
EDC based on today's measurements: [DATE]  (5+ week
US - no embryo identified for measurement)
Recommendations

Offer MSAFP in the second trimester for ONTD screening
Offer U/S for anatomy by 18 weeks

## 2016-05-25 ENCOUNTER — Encounter: Payer: Self-pay | Admitting: Obstetrics & Gynecology

## 2016-05-31 ENCOUNTER — Ambulatory Visit (INDEPENDENT_AMBULATORY_CARE_PROVIDER_SITE_OTHER): Payer: Medicaid Other | Admitting: Obstetrics and Gynecology

## 2016-05-31 ENCOUNTER — Encounter: Payer: Self-pay | Admitting: *Deleted

## 2016-05-31 VITALS — BP 120/77 | HR 105 | Wt 171.0 lb

## 2016-05-31 DIAGNOSIS — Z3482 Encounter for supervision of other normal pregnancy, second trimester: Secondary | ICD-10-CM

## 2016-05-31 DIAGNOSIS — Z3491 Encounter for supervision of normal pregnancy, unspecified, first trimester: Secondary | ICD-10-CM

## 2016-05-31 NOTE — Progress Notes (Signed)
Subjective:  Jody Perez is a 25 y.o. G3P1011 at 7754w2d being seen today for ongoing prenatal care.  She is currently monitored for the following issues for this low-risk pregnancy and has History of genital HSV and Supervision of normal pregnancy on her problem list.  Patient reports no complaints.   Contractions: Not present. Vag. Bleeding: None.  Movement: Absent. Denies leaking of fluid.   The following portions of the patient's history were reviewed and updated as appropriate: allergies, current medications, past family history, past medical history, past social history, past surgical history and problem list. Problem list updated.  Objective:   Filed Vitals:   05/31/16 1057  BP: 120/77  Pulse: 105  Weight: 171 lb (77.565 kg)    Fetal Status: Fetal Heart Rate (bpm): 154   Movement: Absent     General:  Alert, oriented and cooperative. Patient is in no acute distress.  Skin: Skin is warm and dry. No rash noted.   Cardiovascular: Normal heart rate noted  Respiratory: Normal respiratory effort, no problems with respiration noted  Abdomen: Soft, gravid, appropriate for gestational age. Pain/Pressure: Absent     Pelvic:  Cervical exam deferred        Extremities: Normal range of motion.  Edema: None  Mental Status: Normal mood and affect. Normal behavior. Normal judgment and thought content.   Urinalysis:      Assessment and Plan:  Pregnancy: G3P1011 at 5654w2d  1. Supervision of normal pregnancy, first trimester Continue to watch weight. Pt states she doesn't have any issues with eating or drinking and only minimal s/s of nausea. AFP nv Anatomy scan ordered - US MFM OB COMP + 14 WK; Future  Preterm labor symptoms and general obstetric precautions including but not limited to vaginal bleeding, contractions, leaking of fluid and fetal movement were reviewed in detail with the patient. Please refer to After Visit Summary for other counseling recommendations.  2-3wk  ROB   [place] Jody Alleta Avery, MD

## 2016-06-01 ENCOUNTER — Other Ambulatory Visit (HOSPITAL_COMMUNITY): Payer: Self-pay

## 2016-06-21 ENCOUNTER — Encounter: Payer: Medicaid Other | Admitting: Obstetrics & Gynecology

## 2016-06-22 ENCOUNTER — Ambulatory Visit (INDEPENDENT_AMBULATORY_CARE_PROVIDER_SITE_OTHER): Payer: Medicaid Other | Admitting: Obstetrics & Gynecology

## 2016-06-22 VITALS — BP 104/66 | HR 81 | Wt 173.0 lb

## 2016-06-22 DIAGNOSIS — Z36 Encounter for antenatal screening of mother: Secondary | ICD-10-CM

## 2016-06-22 DIAGNOSIS — Z3482 Encounter for supervision of other normal pregnancy, second trimester: Secondary | ICD-10-CM

## 2016-06-22 DIAGNOSIS — Z3492 Encounter for supervision of normal pregnancy, unspecified, second trimester: Secondary | ICD-10-CM

## 2016-06-22 DIAGNOSIS — Z8619 Personal history of other infectious and parasitic diseases: Secondary | ICD-10-CM

## 2016-06-22 NOTE — Progress Notes (Signed)
Subjective:  Jody Perez is a 25 y.o. S [id number] (2 yo daughter at home)  at [redacted]w[redacted]d being seen today for ongoing prenatal care.  She is currently monitored for the following issues for this low-risk pregnancy and has History of genital HSV and Supervision of normal pregnancy on her problem list.  Patient reports daily headaches, chronic. They are BL and temporal, uses Tylenol which helps.She denies aura. Some allergies.  Contractions: Not present. Vag. Bleeding: None.  Movement: Absent. Denies leaking of fluid.   The following portions of the patient's history were reviewed and updated as appropriate: allergies, current medications, past family history, past medical history, past social history, past surgical history and problem list. Problem list updated.  Objective:   Vitals:   06/22/16 1050  BP: 104/66  Pulse: 81  Weight: 173 lb (78.5 kg)    Fetal Status: Fetal Heart Rate (bpm): 147   Movement: Absent     General:  Alert, oriented and cooperative. Patient is in no acute distress.  Skin: Skin is warm and dry. No rash noted.   Cardiovascular: Normal heart rate noted  Respiratory: Normal respiratory effort, no problems with respiration noted  Abdomen: Soft, gravid, appropriate for gestational age. Pain/Pressure: Absent     Pelvic:  Cervical exam deferred        Extremities: Normal range of motion.  Edema: None  Mental Status: Normal mood and affect. Normal behavior. Normal judgment and thought content.   Urinalysis:      Assessment and Plan:  Pregnancy: G3P1011 at [redacted]w[redacted]d  1. History of genital HSV - Daily valtrex  2. Supervision of normal pregnancy, second trimester  - Anatomy u/s 07-11-16 -  AFP today 3. Headaches- rec daily zyrtec  Preterm labor symptoms and general obstetric precautions including but not limited to vaginal bleeding, contractions, leaking of fluid and fetal movement were reviewed in detail with the patient. Please refer to After Visit Summary for other  counseling recommendations.  Return in about 4 weeks (around 07/20/2016).   Allie Bossier, MD

## 2016-06-22 NOTE — Addendum Note (Signed)
Addended by: Gita Kudo on: 06/22/2016 11:08 AM   Modules accepted: Orders

## 2016-06-23 LAB — ALPHA FETOPROTEIN, MATERNAL
AFP: 31.2 ng/mL
Curr Gest Age: 16.4 wk
MoM for AFP: 0.84
Open Spina bifida: NEGATIVE
Osb Risk: 1:47300 {titer}

## 2016-07-11 ENCOUNTER — Encounter (HOSPITAL_COMMUNITY): Payer: Self-pay

## 2016-07-11 ENCOUNTER — Ambulatory Visit (HOSPITAL_COMMUNITY)
Admission: RE | Admit: 2016-07-11 | Discharge: 2016-07-11 | Disposition: A | Discharge status: Home / Self Care | Payer: Medicaid Other | Source: Ambulatory Visit | Attending: Obstetrics and Gynecology | Admitting: Obstetrics and Gynecology

## 2016-07-11 DIAGNOSIS — Z36 Encounter for antenatal screening of mother: Secondary | ICD-10-CM | POA: Diagnosis not present

## 2016-07-11 DIAGNOSIS — Z3491 Encounter for supervision of normal pregnancy, unspecified, first trimester: Secondary | ICD-10-CM

## 2016-07-11 DIAGNOSIS — Z3A19 19 weeks gestation of pregnancy: Secondary | ICD-10-CM | POA: Diagnosis not present

## 2016-07-11 IMAGING — US US MFM OB COMP +14 WKS
1 series · 14 of 28 positions shown · non-contrast
Comparison: none

[Series 1: us mfm ob comp +14 wks · 14 of 76 slices shown]
[im 3/76]
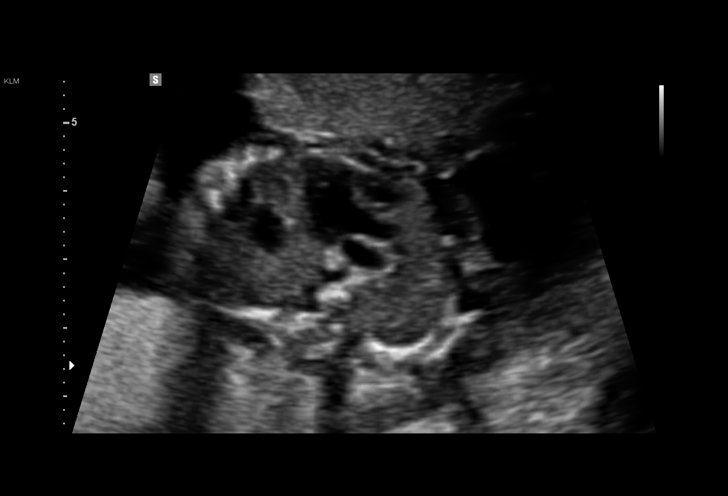
[im 9/76]
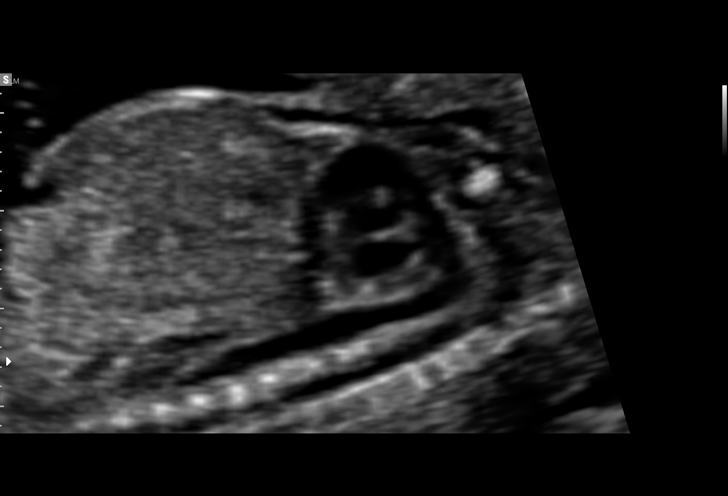
[im 14/76]
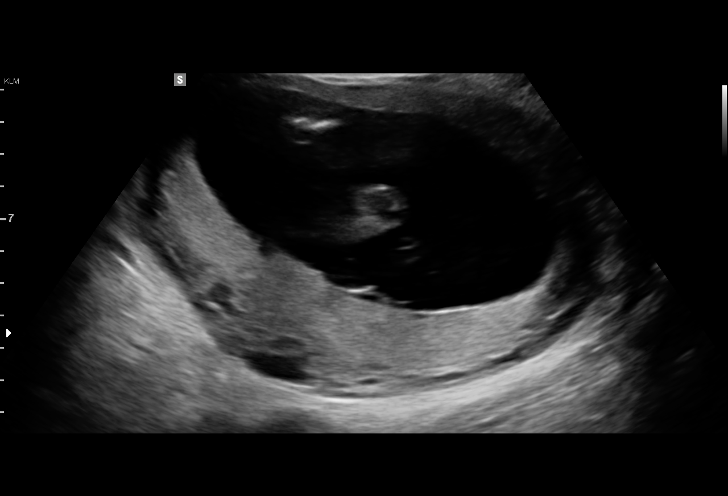
[im 20/76]
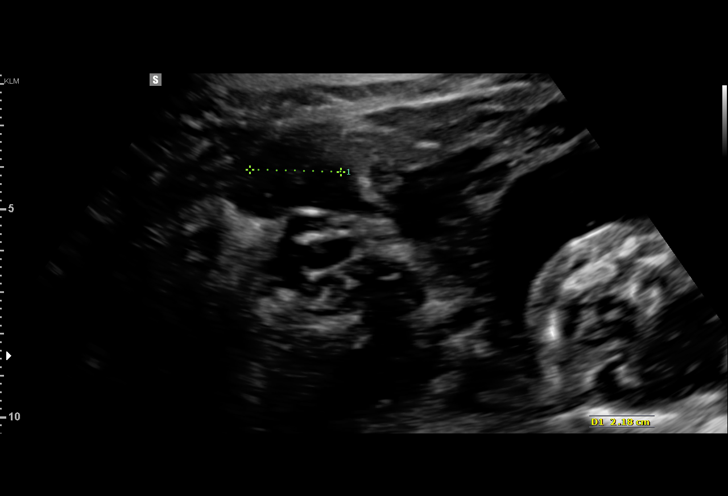
[im 26/76]
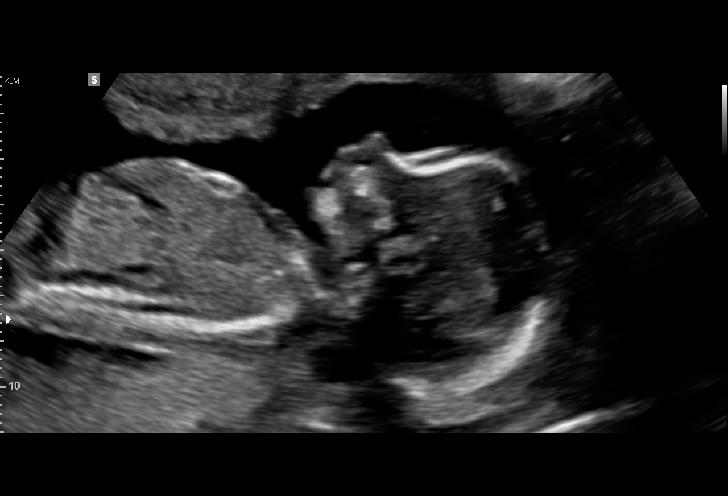
[im 31/76]
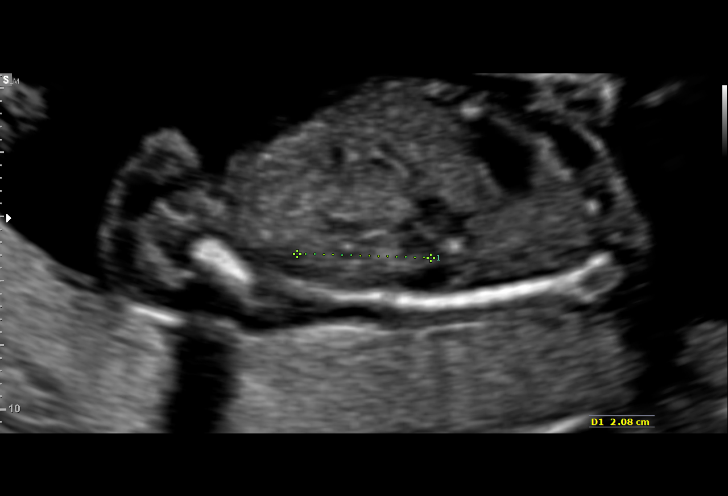
[im 37/76]
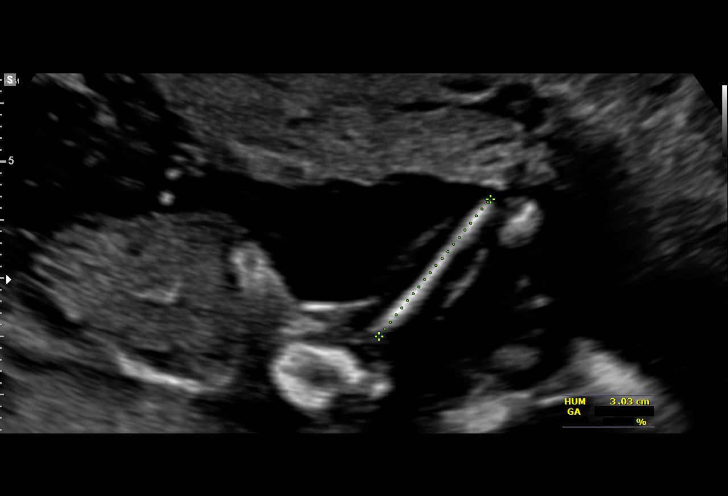
[im 42/76]
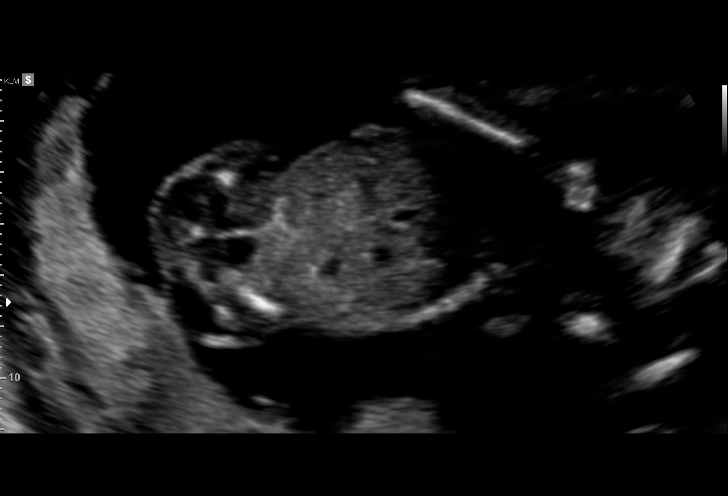
[im 48/76]
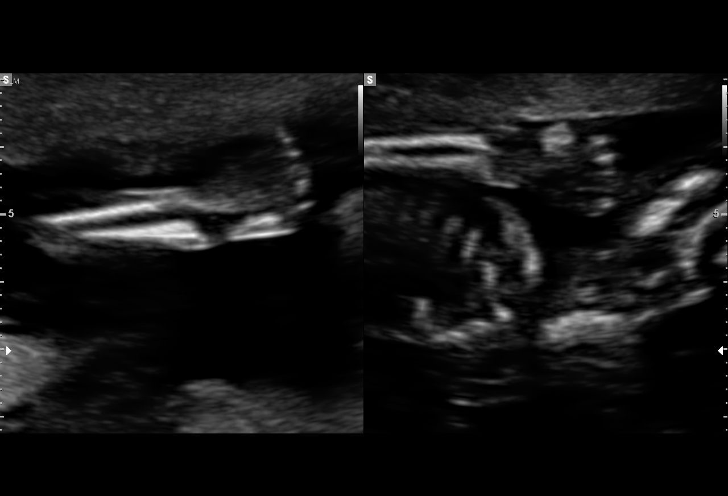
[im 53/76]
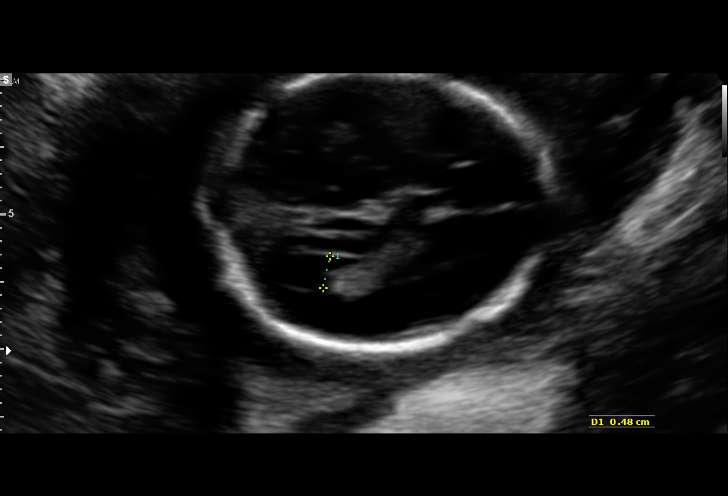
[im 59/76]
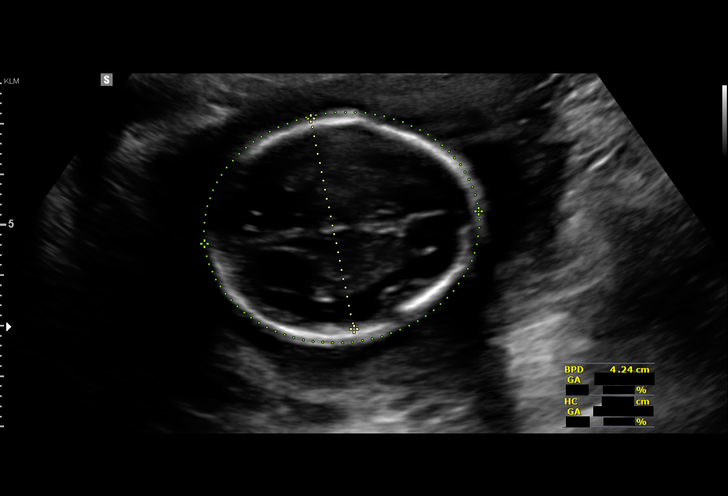
[im 64/76]
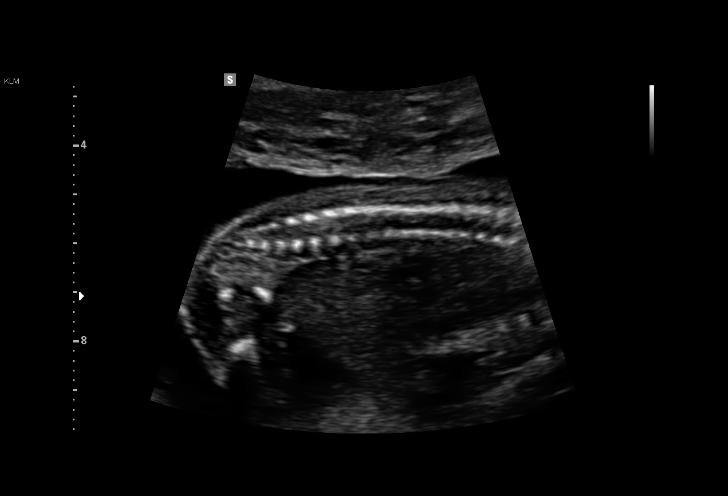
[im 70/76]
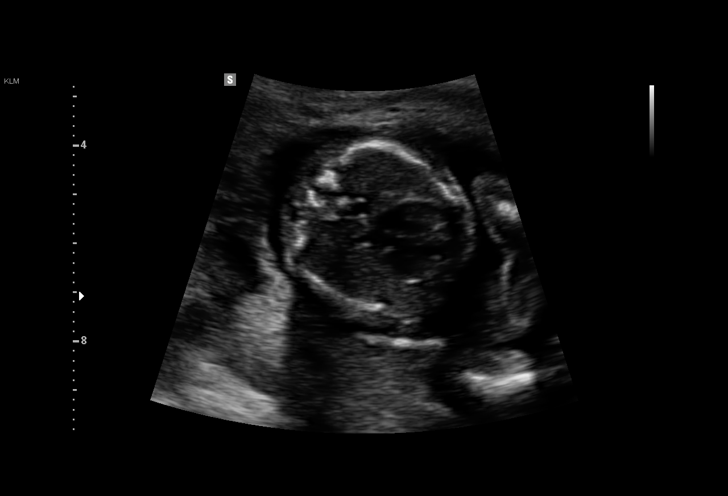
[im 76/76]
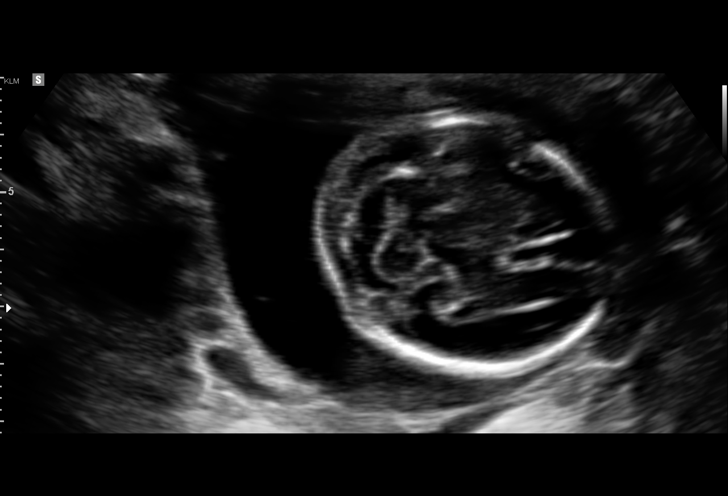

[14 of 28 positions shown; findings below may reference images not displayed]

1  SUAD SUKI          [PHONE_NUMBER]      [PHONE_NUMBER]     [PHONE_NUMBER]
Indications

19 weeks gestation of pregnancy
Basic anatomic survey                          Z36
Fetal Evaluation

Num Of Fetuses:     1
Fetal Heart         161
Rate(bpm):
Cardiac Activity:   Observed
Presentation:       Cephalic
Placenta:           Posterior, above cervical os
P. Cord Insertion:  Previously Visualized

Amniotic Fluid
AFI FV:      Subjectively within normal limits

Largest Pocket(cm)
3.9
Biometry

BPD:      42.5  mm     G. Age:  18w 6d         38  %    CI:        73.18   %   70 - 86
FL/HC:      18.7   %   16.1 -
HC:      157.9  mm     G. Age:  18w 5d         22  %    HC/AC:      1.15       1.09 -
AC:       137   mm     G. Age:  19w 1d         45  %    FL/BPD:     69.4   %
FL:       29.5  mm     G. Age:  19w 1d         41  %    FL/AC:      21.5   %   20 - 24
HUM:      28.4  mm     G. Age:  19w 1d         51  %

Est. FW:     272  gm    0 lb 10 oz      44  %
Gestational Age
LMP:           21w 6d       Date:   [DATE]                 EDD:   [DATE]
U/S Today:     19w 0d                                        EDD:   [DATE]
Anatomy

Cranium:               Appears normal         Aortic Arch:            Appears normal
Cavum:                 Appears normal         Ductal Arch:            Appears normal
Ventricles:            Appears normal         Diaphragm:              Appears normal
Choroid Plexus:        Appears normal         Stomach:                Appears normal, left
sided
Cerebellum:            Appears normal         Abdomen:                Appears normal
Posterior Fossa:       Appears normal         Abdominal Wall:         Appears nml (cord
insert, abd wall)
Nuchal Fold:           Appears normal         Cord Vessels:           Appears normal (3
vessel cord)
Face:                  Appears normal         Kidneys:                Appear normal
(orbits and profile)
Lips:                  Appears normal         Bladder:                Appears normal
Thoracic:              Appears normal         Spine:                  Appears normal
Heart:                 Appears normal         Upper Extremities:      Appears normal
(4CH, axis, and situs
RVOT:                  Appears normal         Lower Extremities:      Appears normal
LVOT:                  Appears normal

Other:  Parents do not wish to know sex of fetus. Fetus appears to be a
female. 5th digit visualized. Nasal bone visualized. Technically
difficult due to fetal position.
Cervix Uterus Adnexa

Cervix
Length:            3.9  cm.
Normal appearance by transabdominal scan.

Uterus
No abnormality visualized.

Left Ovary
Within normal limits.

Right Ovary
Within normal limits.

Cul De Sac:   No free fluid seen.

Adnexa:       No abnormality visualized.
Impression

SIUP at 19+1 weeks
Normal detailed fetal anatomy
Markers of aneuploidy: none
Normal amniotic fluid volume
Measurements consistent with prior US
Recommendations

Follow-up as clinically indicated

## 2016-07-18 ENCOUNTER — Encounter: Payer: Medicaid Other | Admitting: Obstetrics & Gynecology

## 2016-07-24 ENCOUNTER — Encounter: Payer: Medicaid Other | Admitting: Obstetrics & Gynecology

## 2016-07-27 ENCOUNTER — Ambulatory Visit (INDEPENDENT_AMBULATORY_CARE_PROVIDER_SITE_OTHER): Payer: Medicaid Other | Admitting: Family Medicine

## 2016-07-27 ENCOUNTER — Encounter: Payer: Self-pay | Admitting: Family Medicine

## 2016-07-27 VITALS — BP 114/73 | HR 84 | Wt 177.0 lb

## 2016-07-27 DIAGNOSIS — Z3482 Encounter for supervision of other normal pregnancy, second trimester: Secondary | ICD-10-CM

## 2016-07-27 DIAGNOSIS — Z3492 Encounter for supervision of normal pregnancy, unspecified, second trimester: Secondary | ICD-10-CM

## 2016-07-27 NOTE — Progress Notes (Signed)
   PRENATAL VISIT NOTE  Subjective:  Jody Perez is a 25 y.o. G3P1011 at 5850w3d being seen today for ongoing prenatal care.  She is currently monitored for the following issues for this low-risk pregnancy and has History of genital HSV and Supervision of normal pregnancy on her problem list.  Patient reports no complaints.  Contractions: Not present. Vag. Bleeding: None.  Movement: Present. Denies leaking of fluid.   The following portions of the patient's history were reviewed and updated as appropriate: allergies, current medications, past family history, past medical history, past social history, past surgical history and problem list. Problem list updated.  Objective:   Vitals:   07/27/16 0859  BP: 114/73  Pulse: 84  Weight: 177 lb (80.3 kg)    Fetal Status: Fetal Heart Rate (bpm): 140   Movement: Present     General:  Alert, oriented and cooperative. Patient is in no acute distress.  Skin: Skin is warm and dry. No rash noted.   Cardiovascular: Normal heart rate noted  Respiratory: Normal respiratory effort, no problems with respiration noted  Abdomen: Soft, gravid, appropriate for gestational age. Pain/Pressure: Present     Pelvic:  Cervical exam deferred        Extremities: Normal range of motion.  Edema: None  Mental Status: Normal mood and affect. Normal behavior. Normal judgment and thought content.   Urinalysis:      Assessment and Plan:  Pregnancy: G3P1011 at 7450w3d  1. Supervision of normal pregnancy, second trimester TUms for GERD.  FHT and Fh normal.    Preterm labor symptoms and general obstetric precautions including but not limited to vaginal bleeding, contractions, leaking of fluid and fetal movement were reviewed in detail with the patient. Please refer to After Visit Summary for other counseling recommendations.  No Follow-up on file.  Levie HeritageJacob J Stinson, DO

## 2016-08-24 ENCOUNTER — Other Ambulatory Visit: Payer: Self-pay | Admitting: Obstetrics and Gynecology

## 2016-08-24 ENCOUNTER — Ambulatory Visit (INDEPENDENT_AMBULATORY_CARE_PROVIDER_SITE_OTHER): Payer: Medicaid Other | Admitting: Obstetrics and Gynecology

## 2016-08-24 DIAGNOSIS — Z3492 Encounter for supervision of normal pregnancy, unspecified, second trimester: Secondary | ICD-10-CM | POA: Diagnosis not present

## 2016-08-24 NOTE — Addendum Note (Signed)
Addended by: Gita KudoLASSITER, Krrish Freund S on: 08/24/2016 11:52 AM   Modules accepted: Orders

## 2016-08-24 NOTE — Progress Notes (Signed)
   PRENATAL VISIT NOTE  Subjective:  Jody Perez is a 25 y.o. G3P1011 at 3545w3d being seen today for ongoing prenatal care.  She is currently monitored for the following issues for this low-risk pregnancy and has History of genital HSV and Supervision of normal pregnancy on her problem list.  Patient reports a pruritic discharge.  Contractions: Not present. Vag. Bleeding: None.  Movement: Present. Denies leaking of fluid.   The following portions of the patient's history were reviewed and updated as appropriate: allergies, current medications, past family history, past medical history, past social history, past surgical history and problem list. Problem list updated.  Objective:   Vitals:   08/24/16 1041  BP: 127/82  Pulse: (!) 101    Fetal Status: Fetal Heart Rate (bpm): 146 Fundal Height: 25 cm Movement: Present     General:  Alert, oriented and cooperative. Patient is in no acute distress.  Skin: Skin is warm and dry. No rash noted.   Cardiovascular: Normal heart rate noted  Respiratory: Normal respiratory effort, no problems with respiration noted  Abdomen: Soft, gravid, appropriate for gestational age. Pain/Pressure: Present     Pelvic:  Thin white discharge without odor        Extremities: Normal range of motion.  Edema: None  Mental Status: Normal mood and affect. Normal behavior. Normal judgment and thought content.   Urinalysis: Urine Protein: 1+ Urine Glucose: Negative  Assessment and Plan:  Pregnancy: G3P1011 at 3445w3d  1) SUPERVISION OF NORMAL PREGNANCY - Patient is doing well Wet prep collected Patient declined flu vaccine Patient will be contacted with abnormal results Third trimester labs next visit  Preterm labor symptoms and general obstetric precautions including but not limited to vaginal bleeding, contractions, leaking of fluid and fetal movement were reviewed in detail with the patient. Please refer to After Visit Summary for other counseling  recommendations.  No Follow-up on file.  Catalina AntiguaPeggy Fady Stamps, MD

## 2016-08-24 NOTE — Addendum Note (Signed)
Addended by: Catalina AntiguaONSTANT, Bonne Whack on: 08/24/2016 11:48 AM   Modules accepted: Orders

## 2016-08-25 ENCOUNTER — Telehealth: Payer: Self-pay | Admitting: *Deleted

## 2016-08-25 ENCOUNTER — Encounter (HOSPITAL_COMMUNITY): Payer: Self-pay

## 2016-08-25 ENCOUNTER — Inpatient Hospital Stay (HOSPITAL_COMMUNITY)
Admission: AD | Admit: 2016-08-25 | Discharge: 2016-08-25 | Disposition: A | Discharge status: Home / Self Care | Payer: Medicaid Other | Source: Ambulatory Visit | Attending: Obstetrics & Gynecology | Admitting: Obstetrics & Gynecology

## 2016-08-25 DIAGNOSIS — Z3A25 25 weeks gestation of pregnancy: Secondary | ICD-10-CM | POA: Diagnosis not present

## 2016-08-25 DIAGNOSIS — O4692 Antepartum hemorrhage, unspecified, second trimester: Secondary | ICD-10-CM | POA: Diagnosis present

## 2016-08-25 DIAGNOSIS — O479 False labor, unspecified: Secondary | ICD-10-CM

## 2016-08-25 LAB — WET PREP, GENITAL
Clue Cells Wet Prep HPF POC: NONE SEEN
SPERM: NONE SEEN
TRICH WET PREP: NONE SEEN
Trich, Wet Prep: NONE SEEN
YEAST WET PREP: NONE SEEN
Yeast Wet Prep HPF POC: NONE SEEN

## 2016-08-25 LAB — URINE MICROSCOPIC-ADD ON

## 2016-08-25 LAB — URINALYSIS, ROUTINE W REFLEX MICROSCOPIC
BILIRUBIN URINE: NEGATIVE
Glucose, UA: NEGATIVE mg/dL
HGB URINE DIPSTICK: NEGATIVE
KETONES UR: 15 mg/dL — AB
NITRITE: NEGATIVE
PROTEIN: NEGATIVE mg/dL
SPECIFIC GRAVITY, URINE: 1.02 (ref 1.005–1.030)
pH: 6.5 (ref 5.0–8.0)

## 2016-08-25 LAB — GC/CHLAMYDIA PROBE AMP (~~LOC~~) NOT AT ARMC
CHLAMYDIA, DNA PROBE: NEGATIVE
NEISSERIA GONORRHEA: NEGATIVE

## 2016-08-25 MED ORDER — METRONIDAZOLE 500 MG PO TABS: 500.0000 mg | ORAL_TABLET | Freq: Two times a day (BID) | ORAL | 0 refills | Status: DC

## 2016-08-25 NOTE — Discharge Instructions (Signed)
Braxton Hicks Contractions °Contractions of the uterus can occur throughout pregnancy. Contractions are not always a sign that you are in labor.  °WHAT ARE BRAXTON HICKS CONTRACTIONS?  °Contractions that occur before labor are called Braxton Hicks contractions, or false labor. Toward the end of pregnancy (32-34 weeks), these contractions can develop more often and may become more forceful. This is not true labor because these contractions do not result in opening (dilatation) and thinning of the cervix. They are sometimes difficult to tell apart from true labor because these contractions can be forceful and people have different pain tolerances. You should not feel embarrassed if you go to the hospital with false labor. Sometimes, the only way to tell if you are in true labor is for your health care provider to look for changes in the cervix. °If there are no prenatal problems or other health problems associated with the pregnancy, it is completely safe to be sent home with false labor and await the onset of true labor. °HOW CAN YOU TELL THE DIFFERENCE BETWEEN TRUE AND FALSE LABOR? °False Labor °· The contractions of false labor are usually shorter and not as hard as those of true labor.   °· The contractions are usually irregular.   °· The contractions are often felt in the front of the lower abdomen and in the groin.   °· The contractions may go away when you walk around or change positions while lying down.   °· The contractions get weaker and are shorter lasting as time goes on.   °· The contractions do not usually become progressively stronger, regular, and closer together as with true labor.   °True Labor °· Contractions in true labor last 30-70 seconds, become very regular, usually become more intense, and increase in frequency.   °· The contractions do not go away with walking.   °· The discomfort is usually felt in the top of the uterus and spreads to the lower abdomen and low back.   °· True labor can be  determined by your health care provider with an exam. This will show that the cervix is dilating and getting thinner.   °WHAT TO REMEMBER °· Keep up with your usual exercises and follow other instructions given by your health care provider.   °· Take medicines as directed by your health care provider.   °· Keep your regular prenatal appointments.   °· Eat and drink lightly if you think you are going into labor.   °· If Braxton Hicks contractions are making you uncomfortable:   °¨ Change your position from lying down or resting to walking, or from walking to resting.   °¨ Sit and rest in a tub of warm water.   °¨ Drink 2-3 glasses of water. Dehydration may cause these contractions.   °¨ Do slow and deep breathing several times an hour.   °WHEN SHOULD I SEEK IMMEDIATE MEDICAL CARE? °Seek immediate medical care if: °· Your contractions become stronger, more regular, and closer together.   °· You have fluid leaking or gushing from your vagina.   °· You have a fever.   °· You pass blood-tinged mucus.   °· You have vaginal bleeding.   °· You have continuous abdominal pain.   °· You have low back pain that you never had before.   °· You feel your baby's head pushing down and causing pelvic pressure.   °· Your baby is not moving as much as it used to.   °  °This information is not intended to replace advice given to you by your health care provider. Make sure you discuss any questions you have with your health care   provider. °  °Document Released: 11/13/2005 Document Revised: 11/18/2013 Document Reviewed: 08/25/2013 °Elsevier Interactive Patient Education ©2016 [employer] ° °

## 2016-08-25 NOTE — Addendum Note (Signed)
Addended by: Catalina AntiguaONSTANT, Alyana Kreiter on: 08/25/2016 09:04 AM   Modules accepted: Orders

## 2016-08-25 NOTE — MAU Note (Signed)
Pt c/o vag spotting and pressure with urination. Denies contractions. +FM. Denies hx of PTL

## 2016-08-25 NOTE — MAU Provider Note (Signed)
  History     CSN: [id number]  Arrival date and time: 08/25/16 0047   First Provider Initiated Contact with Patient 08/25/16 0146      Chief Complaint  Patient presents with  . Vaginal Bleeding   Jody HeadlandKyata Mayol is a 25 y.o. G3P1011 at 6477w4d who presents with pelvic pressure. States has had increase pelvic pressure for the last 2-3 days,  "feels like baby is low and wants to come out" but denies contractions. Was seen for regularly scheduled pre-natal visit earlier today and was told that she was not in labor. States then went home and after urinating this evening noticed bright red blood only on wiping x1. States also has vaginal whitish-yellow itchy discharge x 2 days for the past 2 days. Denies LOF. Endorses good fetal movement.    OB History    Gravida Para Term Preterm AB Living   3 1 1   1 1    SAB TAB Ectopic Multiple Live Births   1       1      Past Medical History:  Diagnosis Date  . BV (bacterial vaginosis)   . Chlamydia   . Herpes genitalis in women   . HSV infection   . Miscarriage   . Trichomonas   . UTI (lower urinary tract infection)     Past Surgical History:  Procedure Laterality Date  . NO PAST SURGERIES      Family History  Problem Relation Age of Onset  . Hypertension Mother   . Hypertension Brother     Social History  Substance Use Topics  . Smoking status: Never Smoker  . Smokeless tobacco: Never Used  . Alcohol use Yes     Comment: socially    Allergies: No Known Allergies  No prescriptions prior to admission.    Review of Systems  Constitutional: Negative for chills and fever.  Eyes: Negative for blurred vision, double vision and photophobia.  Respiratory: Negative for shortness of breath.   Cardiovascular: Negative for chest pain.  Gastrointestinal: Negative for abdominal pain, constipation, diarrhea, nausea and vomiting.  Genitourinary: Negative for dysuria, frequency and urgency.       Itchy vaginal discharge  Musculoskeletal:  Negative for back pain.  Neurological: Negative for headaches.   Physical Exam   Blood pressure 115/66, pulse 85, temperature 98.4 F (36.9 C), temperature source Oral, resp. rate 18, height 5\' 4"  (1.626 m), weight 80.3 kg (177 lb), last menstrual period 02/09/2016, SpO2 100 %.  Physical Exam  Constitutional: She is oriented to person, place, and time. She appears well-developed and well-nourished. No distress.  HENT:  Head: Normocephalic and atraumatic.  Cardiovascular: Normal rate and regular rhythm.   No murmur heard. Respiratory: Effort normal and breath sounds normal. No respiratory distress. She has no wheezes.  GI: Soft. Bowel sounds are normal. There is no tenderness. There is no rebound and no guarding.  Genitourinary: Vaginal discharge (thin whitish) found.  Neurological: She is alert and oriented to person, place, and time.  Skin: Skin is warm and dry.  Psychiatric: She has a normal mood and affect.    MAU Course  Procedures  MDM NST reactive. Closed on cervical exam. Wet mount negative UA neg for nitrites  Assessment and Plan  IUP@24 .4 - given labor precautions  Jody Perez,Jody Perez PGY-1 08/25/2016, 8:04 AM

## 2016-08-25 NOTE — Telephone Encounter (Signed)
-----   Message from ElyJacinda S Battle sent at 08/25/2016 11:05 AM EDT ----- Regarding: phone call Pt called back, informed her of BV, and [facility] , told her to take 1 pill by mouth twice a day for 7 days and to call us back if she had any questions

## 2016-08-25 NOTE — Telephone Encounter (Signed)
-----   Message from Catalina AntiguaPeggy Constant, MD sent at 08/25/2016  9:04 AM EDT ----- Please inform patient of BV infection. Flagyl has been e-prescribed  AnimatorThanks  Peggy

## 2016-08-25 NOTE — Telephone Encounter (Signed)
Called pt, no answer, left message to call the office.  

## 2016-08-26 LAB — CULTURE, OB URINE

## 2016-09-08 ENCOUNTER — Inpatient Hospital Stay (HOSPITAL_COMMUNITY)
Admission: AD | Admit: 2016-09-08 | Discharge: 2016-09-08 | Disposition: A | Discharge status: Home / Self Care | Payer: Medicaid Other | Source: Ambulatory Visit | Attending: Family Medicine | Admitting: Family Medicine

## 2016-09-08 ENCOUNTER — Encounter (HOSPITAL_COMMUNITY): Payer: Self-pay

## 2016-09-08 DIAGNOSIS — O4692 Antepartum hemorrhage, unspecified, second trimester: Secondary | ICD-10-CM | POA: Diagnosis not present

## 2016-09-08 DIAGNOSIS — O26892 Other specified pregnancy related conditions, second trimester: Secondary | ICD-10-CM | POA: Insufficient documentation

## 2016-09-08 DIAGNOSIS — Z87440 Personal history of urinary (tract) infections: Secondary | ICD-10-CM | POA: Insufficient documentation

## 2016-09-08 DIAGNOSIS — Z8249 Family history of ischemic heart disease and other diseases of the circulatory system: Secondary | ICD-10-CM | POA: Insufficient documentation

## 2016-09-08 DIAGNOSIS — R109 Unspecified abdominal pain: Secondary | ICD-10-CM | POA: Insufficient documentation

## 2016-09-08 DIAGNOSIS — Z8619 Personal history of other infectious and parasitic diseases: Secondary | ICD-10-CM | POA: Insufficient documentation

## 2016-09-08 DIAGNOSIS — O4693 Antepartum hemorrhage, unspecified, third trimester: Secondary | ICD-10-CM | POA: Insufficient documentation

## 2016-09-08 DIAGNOSIS — Z3A27 27 weeks gestation of pregnancy: Secondary | ICD-10-CM | POA: Insufficient documentation

## 2016-09-08 LAB — WET PREP, GENITAL
Clue Cells Wet Prep HPF POC: NONE SEEN
Sperm: NONE SEEN
Trich, Wet Prep: NONE SEEN
YEAST WET PREP: NONE SEEN

## 2016-09-08 LAB — GC/CHLAMYDIA PROBE AMP (~~LOC~~) NOT AT ARMC
Chlamydia: NEGATIVE
Neisseria Gonorrhea: NEGATIVE

## 2016-09-08 NOTE — MAU Note (Signed)
Patient presents to mau with c/o lower abdominal cramping and vaginal pressure that started within the last 1/2 hour. Also endorses dark red vaginal bleeding; quarter size spot noted on pad. +FM.

## 2016-09-08 NOTE — MAU Provider Note (Signed)
None     Chief Complaint:  Vaginal Bleeding and Abdominal Cramping   Jody Perez is  25 y.o. G3P1011 at 6824w4d presents complaining of Vaginal Bleeding and Abdominal Cramping . Patient presents to mau with c/o lower abdominal cramping and vaginal pressure that started within the last 1/2 hour. Also endorses dark red vaginal bleeding; quarter size spot noted on pad. +FM.   Obstetrical/Gynecological History: OB History    Gravida Para Term Preterm AB Living   3 1 1   1 1    SAB TAB Ectopic Multiple Live Births   1       1     Past Medical History: Past Medical History:  Diagnosis Date  . BV (bacterial vaginosis)   . Chlamydia   . Herpes genitalis in women   . HSV infection   . Miscarriage   . Trichomonas   . UTI (lower urinary tract infection)     Past Surgical History: Past Surgical History:  Procedure Laterality Date  . NO PAST SURGERIES      Family History: Family History  Problem Relation Age of Onset  . Hypertension Mother   . Hypertension Brother     Social History: Social History  Substance Use Topics  . Smoking status: Never Smoker  . Smokeless tobacco: Never Used  . Alcohol use Yes     Comment: socially    Allergies: No Known Allergies  Meds:  Prescriptions Prior to Admission  Medication Sig Dispense Refill Last Dose  . metroNIDAZOLE (FLAGYL) 500 MG tablet Take 1 tablet (500 mg total) by mouth 2 (two) times daily. 14 tablet 0   . Prenatal Vit-Fe Fumarate-FA (PRENATAL MULTIVITAMIN) TABS tablet Take 1 tablet by mouth daily at 12 noon.   Taking    Review of Systems   Constitutional: Negative for fever and chills Eyes: Negative for visual disturbances Respiratory: Negative for shortness of breath, dyspnea Cardiovascular: Negative for chest pain or palpitations  Gastrointestinal: Negative for vomiting, diarrhea and constipation Genitourinary: Negative for dysuria and urgency Musculoskeletal: Negative for back pain, joint pain, myalgias.  Normal  ROM  Neurological: Negative for dizziness and headaches    Physical Exam  Last menstrual period 02/09/2016. GENERAL: Well-developed, well-nourished female in no acute distress.  LUNGS: Clear to auscultation bilaterally.  HEART: Regular rate and rhythm. ABDOMEN: Soft, nontender, nondistended, gravid.  EXTREMITIES: Nontender, no edema, 2+ distal pulses. DTR's 2+ PELVIC:  SSE: no blood whatsoever in vagina  THin white dishcarge without odor.  Cx non friable CERVICAL EXAM: Dilatation 0cm   Effacement 0%   Station -4   Presentation: unsure FHT:  Baseline rate 150 bpm   Variability moderate  Accelerations: reassuring for GA Contractions: Every 0 mins   Labs: Results for orders placed or performed during the hospital encounter of 09/08/16 (from the past 24 hour(s))  Wet prep, genital   Collection Time: 09/08/16  2:25 AM  Result Value Ref Range   Yeast Wet Prep HPF POC NONE SEEN NONE SEEN   Trich, Wet Prep NONE SEEN NONE SEEN   Clue Cells Wet Prep HPF POC NONE SEEN NONE SEEN   WBC, Wet Prep HPF POC MODERATE (A) NONE SEEN   Sperm NONE SEEN    Imaging Studies:  No results found.  Assessment: Jody Perez is  25 y.o. G3P1011 at 5424w4d presents with Vaginal Bleeding, resolved.  Plan: DC home with bleeding precautions  CRESENZO-DISHMAN,Dreon Pineda 10/13/20172:43 AM

## 2016-09-13 ENCOUNTER — Ambulatory Visit (INDEPENDENT_AMBULATORY_CARE_PROVIDER_SITE_OTHER): Payer: Medicaid Other | Admitting: Family Medicine

## 2016-09-13 VITALS — BP 108/69 | HR 84 | Wt 179.0 lb

## 2016-09-13 DIAGNOSIS — Z3403 Encounter for supervision of normal first pregnancy, third trimester: Secondary | ICD-10-CM

## 2016-09-13 DIAGNOSIS — Z8619 Personal history of other infectious and parasitic diseases: Secondary | ICD-10-CM

## 2016-09-13 LAB — CBC
HCT: 32.7 % — ABNORMAL LOW (ref 35.0–45.0)
Hemoglobin: 10.7 g/dL — ABNORMAL LOW (ref 11.7–15.5)
MCH: 30.1 pg (ref 27.0–33.0)
MCHC: 32.7 g/dL (ref 32.0–36.0)
MCV: 92.1 fL (ref 80.0–100.0)
MPV: 10.7 fL (ref 7.5–12.5)
PLATELETS: 360 10*3/uL (ref 140–400)
RBC: 3.55 MIL/uL — AB (ref 3.80–5.10)
RDW: 14.1 % (ref 11.0–15.0)
WBC: 11.7 10*3/uL — AB (ref 3.8–10.8)

## 2016-09-13 NOTE — Progress Notes (Signed)
Pt stated that she had some bleeding on 09/10/2016, [facility] and they examined her and said everything was normal and discharged her the same day.

## 2016-09-13 NOTE — Progress Notes (Signed)
Patient ID: Jody Perez, female   DOB: 24-Aug-1991, 24 y.o.   MRN: [id number]   PRENATAL VISIT NOTE  Subjective:  Jody Perez is a 25 y.o. G3P1011 at 6743w2d being seen today for ongoing prenatal care.  She is currently monitored for the following issues for this low-risk pregnancy and has History of genital HSV and Supervision of normal pregnancy on her problem list.  Patient reports no complaints.  Contractions: Not present. Vag. Bleeding: None.  Movement: Present. Denies leaking of fluid.   The following portions of the patient's history were reviewed and updated as appropriate: allergies, current medications, past family history, past medical history, past social history, past surgical history and problem list. Problem list updated.  Objective:   Vitals:   09/13/16 1033  BP: 108/69  Pulse: 84  Weight: 179 lb (81.2 kg)    Fetal Status: Fetal Heart Rate (bpm): 144   Movement: Present     General:  Alert, oriented and cooperative. Patient is in no acute distress.  Skin: Skin is warm and dry. No rash noted.   Cardiovascular: Normal heart rate noted  Respiratory: Normal respiratory effort, no problems with respiration noted  Abdomen: Soft, gravid, appropriate for gestational age. Pain/Pressure: Present     Pelvic:  Cervical exam deferred        Extremities: Normal range of motion.  Edema: Trace  Mental Status: Normal mood and affect. Normal behavior. Normal judgment and thought content.   Assessment and Plan:  Pregnancy: G3P1011 at 643w2d  1. Encounter for supervision of normal first pregnancy in third trimester - Getting 3rd trimester labs - Glucose Tolerance, 1 HR (50g) - HIV antibody (with reflex) - RPR - CBC - reviewed using [employer] for pictures and that patient does not need medical US - Recommended use of TUMS for reflux  2. History of genital HSV Needs ppx at 36 weeks  Preterm labor symptoms and general obstetric precautions including but not limited to vaginal  bleeding, contractions, leaking of fluid and fetal movement were reviewed in detail with the patient. Please refer to After Visit Summary for other counseling recommendations.  Return in about 2 weeks (around 09/27/2016) for Routine prenatal care.  Federico FlakeKimberly Niles Newton, MD

## 2016-09-14 LAB — RPR

## 2016-09-14 LAB — HIV ANTIBODY (ROUTINE TESTING W REFLEX): HIV: NONREACTIVE

## 2016-09-14 LAB — GLUCOSE TOLERANCE, 1 HOUR (50G) W/O FASTING: GLUCOSE, 1 HR, GESTATIONAL: 79 mg/dL (ref <140)

## 2016-09-27 ENCOUNTER — Encounter: Payer: Self-pay | Admitting: *Deleted

## 2016-09-27 ENCOUNTER — Ambulatory Visit (INDEPENDENT_AMBULATORY_CARE_PROVIDER_SITE_OTHER): Payer: Medicaid Other | Admitting: Family Medicine

## 2016-09-27 VITALS — BP 123/74 | HR 104 | Wt 181.0 lb

## 2016-09-27 DIAGNOSIS — Z8619 Personal history of other infectious and parasitic diseases: Secondary | ICD-10-CM

## 2016-09-27 DIAGNOSIS — Z3403 Encounter for supervision of normal first pregnancy, third trimester: Secondary | ICD-10-CM

## 2016-09-27 DIAGNOSIS — Z23 Encounter for immunization: Secondary | ICD-10-CM

## 2016-09-27 NOTE — Progress Notes (Signed)
   PRENATAL VISIT NOTE  Subjective:  Jody Perez is a 25 y.o. G3P1011 at 952w2d being seen today for ongoing prenatal care.  She is currently monitored for the following issues for this low-risk pregnancy and has History of genital HSV and Supervision of normal pregnancy on her problem list.  Patient reports nausea- intermittent associated with eating.  Contractions: Not present. Vag. Bleeding: None.  Movement: Present. Denies leaking of fluid.   The following portions of the patient's history were reviewed and updated as appropriate: allergies, current medications, past family history, past medical history, past social history, past surgical history and problem list. Problem list updated.  Objective:   Vitals:   09/27/16 1037  BP: 123/74  Pulse: (!) 104  Weight: 181 lb (82.1 kg)    Fetal Status: Fetal Heart Rate (bpm): 158   Movement: Present     General:  Alert, oriented and cooperative. Patient is in no acute distress.  Skin: Skin is warm and dry. No rash noted.   Cardiovascular: Normal heart rate noted  Respiratory: Normal respiratory effort, no problems with respiration noted  Abdomen: Soft, gravid, appropriate for gestational age. Pain/Pressure: Absent     Pelvic:  Cervical exam deferred        Extremities: Normal range of motion.  Edema: Trace  Mental Status: Normal mood and affect. Normal behavior. Normal judgment and thought content.   Assessment and Plan:  Pregnancy: G3P1011 at 5052w2d  1. Encounter for supervision of normal first pregnancy in third trimester UTD Declined flu Tdap today Recommended TUMS with every meal to help with nausea.  2. History of genital HSV Start ppx at 36 weeks-- not currently taking her valtrex despite being given in early pregnancy.  Preterm labor symptoms and general obstetric precautions including but not limited to vaginal bleeding, contractions, leaking of fluid and fetal movement were reviewed in detail with the patient. Please  refer to After Visit Summary for other counseling recommendations.   Follow up in 2 week for routine care  Federico FlakeKimberly Niles Mckinley Olheiser, MD

## 2016-09-27 NOTE — Patient Instructions (Signed)

## 2016-10-12 ENCOUNTER — Ambulatory Visit (INDEPENDENT_AMBULATORY_CARE_PROVIDER_SITE_OTHER): Payer: Medicaid Other | Admitting: Family Medicine

## 2016-10-12 ENCOUNTER — Telehealth: Payer: Self-pay | Admitting: *Deleted

## 2016-10-12 VITALS — BP 116/71 | HR 98 | Wt 182.0 lb

## 2016-10-12 DIAGNOSIS — O219 Vomiting of pregnancy, unspecified: Secondary | ICD-10-CM

## 2016-10-12 DIAGNOSIS — Z8619 Personal history of other infectious and parasitic diseases: Secondary | ICD-10-CM

## 2016-10-12 DIAGNOSIS — Z3403 Encounter for supervision of normal first pregnancy, third trimester: Secondary | ICD-10-CM

## 2016-10-12 MED ORDER — VALACYCLOVIR HCL 1 G PO TABS: 1000.0000 mg | ORAL_TABLET | Freq: Every day | ORAL | 2 refills | Status: DC

## 2016-10-12 MED ORDER — PROMETHAZINE HCL 25 MG PO TABS: 25.0000 mg | ORAL_TABLET | Freq: Four times a day (QID) | ORAL | 2 refills | Status: DC | PRN

## 2016-10-12 NOTE — Progress Notes (Signed)
   PRENATAL VISIT NOTE  Subjective:  Jody Perez is a 25 y.o. G3P1011 at 6579w3d being seen today for ongoing prenatal care.  She is currently monitored for the following issues for this low-risk pregnancy and has History of genital HSV and Supervision of normal pregnancy on her problem list.  Patient reports no complaints.  Contractions: Not present. Vag. Bleeding: None.  Movement: Present. Denies leaking of fluid.   The following portions of the patient's history were reviewed and updated as appropriate: allergies, current medications, past family history, past medical history, past social history, past surgical history and problem list. Problem list updated.  Objective:   Vitals:   10/12/16 1018  BP: 116/71  Pulse: 98  Weight: 182 lb (82.6 kg)    Fetal Status: Fetal Heart Rate (bpm): 150 Fundal Height: 30 cm Movement: Present     General:  Alert, oriented and cooperative. Patient is in no acute distress.  Skin: Skin is warm and dry. No rash noted.   Cardiovascular: Normal heart rate noted  Respiratory: Normal respiratory effort, no problems with respiration noted  Abdomen: Soft, gravid, appropriate for gestational age. Pain/Pressure: Present     Pelvic:  Cervical exam deferred        Extremities: Normal range of motion.  Edema: None  Mental Status: Normal mood and affect. Normal behavior. Normal judgment and thought content.   Assessment and Plan:  Pregnancy: G3P1011 at 8879w3d  1. Encounter for supervision of normal first pregnancy in third trimester Continue routine prenatal care.   2. Nausea and vomiting in pregnancy Phenergan given  3. History of genital HSV Valtrex rx sent in  Preterm labor symptoms and general obstetric precautions including but not limited to vaginal bleeding, contractions, leaking of fluid and fetal movement were reviewed in detail with the patient. Please refer to After Visit Summary for other counseling recommendations.  Return in 2 weeks (on  10/26/2016).   Reva Boresanya S Edwar Coe, MD

## 2016-10-12 NOTE — Telephone Encounter (Signed)
-----   Message from Olevia BowensJacinda S Battle sent at 10/12/2016 11:25 AM EST ----- Regarding: RX Change Pharmacy and [facility] [address] [place]

## 2016-10-12 NOTE — Patient Instructions (Signed)
Third Trimester of Pregnancy The third trimester is from week 29 through week 40 (months 7 through 9). The third trimester is a time when the unborn baby (fetus) is growing rapidly. At the end of the ninth month, the fetus is about 20 inches in length and weighs 6-10 pounds. Body changes during your third trimester Your body goes through many changes during pregnancy. The changes vary from woman to woman. During the third trimester:  Your weight will continue to increase. You can expect to gain 25-35 pounds (11-16 kg) by the end of the pregnancy.  You may begin to get stretch marks on your hips, abdomen, and breasts.  You may urinate more often because the fetus is moving lower into your pelvis and pressing on your bladder.  You may develop or continue to have heartburn. This is caused by increased hormones that slow down muscles in the digestive tract.  You may develop or continue to have constipation because increased hormones slow digestion and cause the muscles that push waste through your intestines to relax.  You may develop hemorrhoids. These are swollen veins (varicose veins) in the rectum that can itch or be painful.  You may develop swollen, bulging veins (varicose veins) in your legs.  You may have increased body aches in the pelvis, back, or thighs. This is due to weight gain and increased hormones that are relaxing your joints.  You may have changes in your hair. These can include thickening of your hair, rapid growth, and changes in texture. Some women also have hair loss during or after pregnancy, or hair that feels dry or thin. Your hair will most likely return to normal after your baby is born.  Your breasts will continue to grow and they will continue to become tender. A yellow fluid (colostrum) may leak from your breasts. This is the first milk you are producing for your baby.  Your belly button may stick out.  You may notice more swelling in your hands, face, or  ankles.  You may have increased tingling or numbness in your hands, arms, and legs. The skin on your belly may also feel numb.  You may feel short of breath because of your expanding uterus.  You may have more problems sleeping. This can be caused by the size of your belly, increased need to urinate, and an increase in your body's metabolism.  You may notice the fetus "dropping," or moving lower in your abdomen.  You may have increased vaginal discharge.  Your cervix becomes thin and soft (effaced) near your due date. What to expect at prenatal visits You will have prenatal exams every 2 weeks until week 36. Then you will have weekly prenatal exams. During a routine prenatal visit:  You will be weighed to make sure you and the fetus are growing normally.  Your blood pressure will be taken.  Your abdomen will be measured to track your baby's growth.  The fetal heartbeat will be listened to.  Any test results from the previous visit will be discussed.  You may have a cervical check near your due date to see if you have effaced. At around 36 weeks, your health care provider will check your cervix. At the same time, your health care provider will also perform a test on the secretions of the vaginal tissue. This test is to determine if a type of bacteria, Group B streptococcus, is present. Your health care provider will explain this further. Your health care provider may ask you:    What your birth plan is.  How you are feeling.  If you are feeling the baby move.  If you have had any abnormal symptoms, such as leaking fluid, bleeding, severe headaches, or abdominal cramping.  If you are using any tobacco products, including cigarettes, chewing tobacco, and electronic cigarettes.  If you have any questions. Other tests or screenings that may be performed during your third trimester include:  Blood tests that check for low iron levels (anemia).  Fetal testing to check the health,  activity level, and growth of the fetus. Testing is done if you have certain medical conditions or if there are problems during the pregnancy.  Nonstress test (NST). This test checks the health of your baby to make sure there are no signs of problems, such as the baby not getting enough oxygen. During this test, a belt is placed around your belly. The baby is made to move, and its heart rate is monitored during movement. What is false labor? False labor is a condition in which you feel small, irregular tightenings of the muscles in the womb (contractions) that eventually go away. These are called Braxton Hicks contractions. Contractions may last for hours, days, or even weeks before true labor sets in. If contractions come at regular intervals, become more frequent, increase in intensity, or become painful, you should see your health care provider. What are the signs of labor?  Abdominal cramps.  Regular contractions that start at 10 minutes apart and become stronger and more frequent with time.  Contractions that start on the top of the uterus and spread down to the lower abdomen and back.  Increased pelvic pressure and dull back pain.  A watery or bloody mucus discharge that comes from the vagina.  Leaking of amniotic fluid. This is also known as your "water breaking." It could be a slow trickle or a gush. Let your doctor know if it has a color or strange odor. If you have any of these signs, call your health care provider right away, even if it is before your due date. Follow these instructions at home: Eating and drinking  Continue to eat regular, healthy meals.  Do not eat:  Raw meat or meat spreads.  Unpasteurized milk or cheese.  Unpasteurized juice.  Store-made salad.  Refrigerated smoked seafood.  Hot dogs or deli meat, unless they are piping hot.  More than 6 ounces of albacore tuna a week.  Shark, swordfish, king mackerel, or tile fish.  Store-made salads.  Raw  sprouts, such as mung bean or alfalfa sprouts.  Take prenatal vitamins as told by your health care provider.  Take 1000 mg of calcium daily as told by your health care provider.  If you develop constipation:  Take over-the-counter or prescription medicines.  Drink enough fluid to keep your urine clear or pale yellow.  Eat foods that are high in fiber, such as fresh fruits and vegetables, whole grains, and beans.  Limit foods that are high in fat and processed sugars, such as fried and sweet foods. Activity  Exercise only as directed by your health care provider. Healthy pregnant women should aim for 2 hours and 30 minutes of moderate exercise per week. If you experience any pain or discomfort while exercising, stop.  Avoid heavy lifting.  Do not exercise in extreme heat or humidity, or at high altitudes.  Wear low-heel, comfortable shoes.  Practice good posture.  Do not travel far distances unless it is absolutely necessary and only with the approval   of your health care provider.  Wear your seat belt at all times while in a car, on a bus, or on a plane.  Take frequent breaks and rest with your legs elevated if you have leg cramps or low back pain.  Do not use hot tubs, steam rooms, or saunas.  You may continue to have sex unless your health care provider tells you otherwise. Lifestyle  Do not use any products that contain nicotine or tobacco, such as cigarettes and e-cigarettes. If you need help quitting, ask your health care provider.  Do not drink alcohol.  Do not use any medicinal herbs or unprescribed drugs. These chemicals affect the formation and growth of the baby.  If you develop varicose veins:  Wear support pantyhose or compression stockings as told by your healthcare provider.  Elevate your feet for 15 minutes, 3-4 times a day.  Wear a supportive maternity bra to help with breast tenderness. General instructions  Take over-the-counter and prescription  medicines only as told by your health care provider. There are medicines that are either safe or unsafe to take during pregnancy.  Take warm sitz baths to soothe any pain or discomfort caused by hemorrhoids. Use hemorrhoid cream or witch hazel if your health care provider approves.  Avoid cat litter boxes and soil used by cats. These carry germs that can cause birth defects in the baby. If you have a cat, ask someone to clean the litter box for you.  To prepare for the arrival of your baby:  Take prenatal classes to understand, practice, and ask questions about the labor and delivery.  Make a trial run to the hospital.  Visit the hospital and tour the maternity area.  Arrange for maternity or paternity leave through employers.  Arrange for family and friends to take care of pets while you are in the hospital.  Purchase a rear-facing car seat and make sure you know how to install it in your car.  Pack your hospital bag.  Prepare the baby's nursery. Make sure to remove all pillows and stuffed animals from the baby's crib to prevent suffocation.  Visit your dentist if you have not gone during your pregnancy. Use a soft toothbrush to brush your teeth and be gentle when you floss.  Keep all prenatal follow-up visits as told by your health care provider. This is important. Contact a health care provider if:  You are unsure if you are in labor or if your water has broken.  You become dizzy.  You have mild pelvic cramps, pelvic pressure, or nagging pain in your abdominal area.  You have lower back pain.  You have persistent nausea, vomiting, or diarrhea.  You have an unusual or bad smelling vaginal discharge.  You have pain when you urinate. Get help right away if:  You have a fever.  You are leaking fluid from your vagina.  You have spotting or bleeding from your vagina.  You have severe abdominal pain or cramping.  You have rapid weight loss or weight gain.  You have  shortness of breath with chest pain.  You notice sudden or extreme swelling of your face, hands, ankles, feet, or legs.  Your baby makes fewer than 10 movements in 2 hours.  You have severe headaches that do not go away with medicine.  You have vision changes. Summary  The third trimester is from week 29 through week 40, months 7 through 9. The third trimester is a time when the unborn baby (fetus)   is growing rapidly.  During the third trimester, your discomfort may increase as you and your baby continue to gain weight. You may have abdominal, leg, and back pain, sleeping problems, and an increased need to urinate.  During the third trimester your breasts will keep growing and they will continue to become tender. A yellow fluid (colostrum) may leak from your breasts. This is the first milk you are producing for your baby.  False labor is a condition in which you feel small, irregular tightenings of the muscles in the womb (contractions) that eventually go away. These are called Braxton Hicks contractions. Contractions may last for hours, days, or even weeks before true labor sets in.  Signs of labor can include: abdominal cramps; regular contractions that start at 10 minutes apart and become stronger and more frequent with time; watery or bloody mucus discharge that comes from the vagina; increased pelvic pressure and dull back pain; and leaking of amniotic fluid. This information is not intended to replace advice given to you by your health care provider. Make sure you discuss any questions you have with your health care provider. Document Released: 11/07/2001 Document Revised: 04/20/2016 Document Reviewed: 01/14/2013 [employer] Interactive Patient Education  2017 [employer]   Breastfeeding Deciding to breastfeed is one of the best choices you can make for you and your baby. A change in hormones during pregnancy causes your breast tissue to grow and increases the number and size of your  milk ducts. These hormones also allow proteins, sugars, and fats from your blood supply to make breast milk in your milk-producing glands. Hormones prevent breast milk from being released before your baby is born as well as prompt milk flow after birth. Once breastfeeding has begun, thoughts of your baby, as well as his or her sucking or crying, can stimulate the release of milk from your milk-producing glands. Benefits of breastfeeding For Your Baby  Your first milk (colostrum) helps your baby's digestive system function better.  There are antibodies in your milk that help your baby fight off infections.  Your baby has a lower incidence of asthma, allergies, and sudden infant death syndrome.  The nutrients in breast milk are better for your baby than infant formulas and are designed uniquely for your baby's needs.  Breast milk improves your baby's brain development.  Your baby is less likely to develop other conditions, such as childhood obesity, asthma, or type 2 diabetes mellitus. For You  Breastfeeding helps to create a very special bond between you and your baby.  Breastfeeding is convenient. Breast milk is always available at the correct temperature and costs nothing.  Breastfeeding helps to burn calories and helps you lose the weight gained during pregnancy.  Breastfeeding makes your uterus contract to its prepregnancy size faster and slows bleeding (lochia) after you give birth.  Breastfeeding helps to lower your risk of developing type 2 diabetes mellitus, osteoporosis, and breast or ovarian cancer later in life. Signs that your baby is hungry Early Signs of Hunger  Increased alertness or activity.  Stretching.  Movement of the head from side to side.  Movement of the head and opening of the mouth when the corner of the mouth or cheek is stroked (rooting).  Increased sucking sounds, smacking lips, cooing, sighing, or squeaking.  Hand-to-mouth movements.  Increased  sucking of fingers or hands. Late Signs of Hunger  Fussing.  Intermittent crying. Extreme Signs of Hunger  Signs of extreme hunger will require calming and consoling before your baby will   be able to breastfeed successfully. Do not wait for the following signs of extreme hunger to occur before you initiate breastfeeding:  Restlessness.  A loud, [place] cry.  Screaming. Breastfeeding basics  Breastfeeding Initiation  Find a comfortable place to sit or lie down, with your neck and back well supported.  Place a pillow or rolled up blanket under your baby to bring him or her to the level of your breast (if you are seated). Nursing pillows are specially designed to help support your arms and your baby while you breastfeed.  Make sure that your baby's abdomen is facing your abdomen.  Gently massage your breast. With your fingertips, massage from your chest wall toward your nipple in a circular motion. This encourages milk flow. You may need to continue this action during the feeding if your milk flows slowly.  Support your breast with 4 fingers underneath and your thumb above your nipple. Make sure your fingers are well away from your nipple and your baby's mouth.  Stroke your baby's lips gently with your finger or nipple.  When your baby's mouth is open wide enough, quickly bring your baby to your breast, placing your entire nipple and as much of the colored area around your nipple (areola) as possible into your baby's mouth.  More areola should be visible above your baby's upper lip than below the lower lip.  Your baby's tongue should be between his or her lower gum and your breast.  Ensure that your baby's mouth is correctly positioned around your nipple (latched). Your baby's lips should create a seal on your breast and be turned out (everted).  It is common for your baby to suck about 2-3 minutes in order to start the flow of breast milk. Latching  Teaching your baby how to latch  on to your breast properly is very important. An improper latch can cause nipple pain and decreased milk supply for you and poor weight gain in your baby. Also, if your baby is not latched onto your nipple properly, he or she may swallow some air during feeding. This can make your baby fussy. Burping your baby when you switch breasts during the feeding can help to get rid of the air. However, teaching your baby to latch on properly is still the best way to prevent fussiness from swallowing air while breastfeeding. Signs that your baby has successfully latched on to your nipple:  Silent tugging or silent sucking, without causing you pain.  Swallowing heard between every 3-4 sucks.  Muscle movement above and in front of his or her ears while sucking. Signs that your baby has not successfully latched on to nipple:  Sucking sounds or smacking sounds from your baby while breastfeeding.  Nipple pain. If you think your baby has not latched on correctly, slip your finger into the corner of your baby's mouth to break the suction and place it between your baby's gums. Attempt breastfeeding initiation again. Signs of Successful Breastfeeding  Signs from your baby:  A gradual decrease in the number of sucks or complete cessation of sucking.  Falling asleep.  Relaxation of his or her body.  Retention of a small amount of milk in his or her mouth.  Letting go of your breast by himself or herself. Signs from you:  Breasts that have increased in firmness, weight, and size 1-3 hours after feeding.  Breasts that are softer immediately after breastfeeding.  Increased milk volume, as well as a change in milk consistency and color by   the fifth day of breastfeeding.  Nipples that are not sore, cracked, or bleeding. Signs That Your Baby is Getting Enough Milk  Wetting at least 1-2 diapers during the first 24 hours after birth.  Wetting at least 5-6 diapers every 24 hours for the first week after  birth. The urine should be clear or pale yellow by 5 days after birth.  Wetting 6-8 diapers every 24 hours as your baby continues to grow and develop.  At least 3 stools in a 24-hour period by age 5 days. The stool should be soft and yellow.  At least 3 stools in a 24-hour period by age 7 days. The stool should be seedy and yellow.  No loss of weight greater than 10% of birth weight during the first 3 days of age.  Average weight gain of 4-7 ounces (113-198 g) per week after age 4 days.  Consistent daily weight gain by age 5 days, without weight loss after the age of 2 weeks. After a feeding, your baby may spit up a small amount. This is common. Breastfeeding frequency and duration Frequent feeding will help you make more milk and can prevent sore nipples and breast engorgement. Breastfeed when you feel the need to reduce the fullness of your breasts or when your baby shows signs of hunger. This is called "breastfeeding on demand." Avoid introducing a pacifier to your baby while you are working to establish breastfeeding (the first 4-6 weeks after your baby is born). After this time you may choose to use a pacifier. Research has shown that pacifier use during the first year of a baby's life decreases the risk of sudden infant death syndrome (SIDS). Allow your baby to feed on each breast as long as he or she wants. Breastfeed until your baby is finished feeding. When your baby unlatches or falls asleep while feeding from the first breast, offer the second breast. Because newborns are often sleepy in the first few weeks of life, you may need to awaken your baby to get him or her to feed. Breastfeeding times will vary from baby to baby. However, the following rules can serve as a guide to help you ensure that your baby is properly fed:  Newborns (babies 4 weeks of age or younger) may breastfeed every 1-3 hours.  Newborns should not go longer than 3 hours during the day or 5 hours during the night  without breastfeeding.  You should breastfeed your baby a minimum of 8 times in a 24-hour period until you begin to introduce solid foods to your baby at around 6 months of age. Breast milk pumping Pumping and storing breast milk allows you to ensure that your baby is exclusively fed your breast milk, even at times when you are unable to breastfeed. This is especially important if you are going back to work while you are still breastfeeding or when you are not able to be present during feedings. Your lactation consultant can give you guidelines on how long it is safe to store breast milk. A breast pump is a machine that allows you to pump milk from your breast into a sterile bottle. The pumped breast milk can then be stored in a refrigerator or freezer. Some breast pumps are operated by hand, while others use electricity. Ask your lactation consultant which type will work best for you. Breast pumps can be purchased, but some hospitals and breastfeeding support groups lease breast pumps on a monthly basis. A lactation consultant can teach you how to   hand express breast milk, if you prefer not to use a pump. Caring for your breasts while you breastfeed Nipples can become dry, cracked, and sore while breastfeeding. The following recommendations can help keep your breasts moisturized and healthy:  Avoid using soap on your nipples.  Wear a supportive bra. Although not required, special nursing bras and tank tops are designed to allow access to your breasts for breastfeeding without taking off your entire bra or top. Avoid wearing underwire-style bras or extremely tight bras.  Air dry your nipples for 3-4minutes after each feeding.  Use only cotton bra pads to absorb leaked breast milk. Leaking of breast milk between feedings is normal.  Use lanolin on your nipples after breastfeeding. Lanolin helps to maintain your skin's normal moisture barrier. If you use pure lanolin, you do not need to wash it off  before feeding your baby again. Pure lanolin is not toxic to your baby. You may also hand express a few drops of breast milk and gently massage that milk into your nipples and allow the milk to air dry. In the first few weeks after giving birth, some women experience extremely full breasts (engorgement). Engorgement can make your breasts feel heavy, warm, and tender to the touch. Engorgement peaks within 3-5 days after you give birth. The following recommendations can help ease engorgement:  Completely empty your breasts while breastfeeding or pumping. You may want to start by applying warm, moist heat (in the shower or with warm water-soaked hand towels) just before feeding or pumping. This increases circulation and helps the milk flow. If your baby does not completely empty your breasts while breastfeeding, pump any extra milk after he or she is finished.  Wear a snug bra (nursing or regular) or tank top for 1-2 days to signal your body to slightly decrease milk production.  Apply ice packs to your breasts, unless this is too uncomfortable for you.  Make sure that your baby is latched on and positioned properly while breastfeeding. If engorgement persists after 48 hours of following these recommendations, contact your health care provider or a lactation consultant. Overall health care recommendations while breastfeeding  Eat healthy foods. Alternate between meals and snacks, eating 3 of each per day. Because what you eat affects your breast milk, some of the foods may make your baby more irritable than usual. Avoid eating these foods if you are sure that they are negatively affecting your baby.  Drink milk, fruit juice, and water to satisfy your thirst (about 10 glasses a day).  Rest often, relax, and continue to take your prenatal vitamins to prevent fatigue, stress, and anemia.  Continue breast self-awareness checks.  Avoid chewing and smoking tobacco. Chemicals from cigarettes that pass  into breast milk and exposure to secondhand smoke may harm your baby.  Avoid alcohol and drug use, including marijuana. Some medicines that may be harmful to your baby can pass through breast milk. It is important to ask your health care provider before taking any medicine, including all over-the-counter and prescription medicine as well as vitamin and herbal supplements. It is possible to become pregnant while breastfeeding. If birth control is desired, ask your health care provider about options that will be safe for your baby. Contact a health care provider if:  You feel like you want to stop breastfeeding or have become frustrated with breastfeeding.  You have painful breasts or nipples.  Your nipples are cracked or bleeding.  Your breasts are red, tender, or warm.  You have   a swollen area on either breast.  You have a fever or chills.  You have nausea or vomiting.  You have drainage other than breast milk from your nipples.  Your breasts do not become full before feedings by the fifth day after you give birth.  You feel sad and depressed.  Your baby is too sleepy to eat well.  Your baby is having trouble sleeping.  Your baby is wetting less than 3 diapers in a 24-hour period.  Your baby has less than 3 stools in a 24-hour period.  Your baby's skin or the white part of his or her eyes becomes yellow.  Your baby is not gaining weight by 5 days of age. Get help right away if:  Your baby is overly tired (lethargic) and does not want to wake up and feed.  Your baby develops an unexplained fever. This information is not intended to replace advice given to you by your health care provider. Make sure you discuss any questions you have with your health care provider. Document Released: 11/13/2005 Document Revised: 04/26/2016 Document Reviewed: 05/07/2013 Elsevier Interactive Patient Education  2017 [employer]vier [employer]  

## 2016-10-12 NOTE — Telephone Encounter (Signed)
Resent rx to requested pharmacy.

## 2016-10-15 ENCOUNTER — Other Ambulatory Visit: Payer: Self-pay | Admitting: Obstetrics and Gynecology

## 2016-10-15 ENCOUNTER — Encounter: Payer: Self-pay | Admitting: Emergency Medicine

## 2016-10-15 ENCOUNTER — Emergency Department
Admission: EM | Admit: 2016-10-15 | Discharge: 2016-10-15 | Disposition: A | Discharge status: Home / Self Care | Payer: No Typology Code available for payment source | Attending: Emergency Medicine | Admitting: Emergency Medicine

## 2016-10-15 ENCOUNTER — Observation Stay
Admission: EM | Admit: 2016-10-15 | Discharge: 2016-10-15 | Disposition: A | Discharge status: Home / Self Care | Payer: Medicaid Other | Attending: Obstetrics and Gynecology | Admitting: Obstetrics and Gynecology

## 2016-10-15 ENCOUNTER — Emergency Department: Payer: No Typology Code available for payment source

## 2016-10-15 DIAGNOSIS — O26893 Other specified pregnancy related conditions, third trimester: Secondary | ICD-10-CM | POA: Diagnosis present

## 2016-10-15 DIAGNOSIS — Z3A32 32 weeks gestation of pregnancy: Secondary | ICD-10-CM | POA: Diagnosis not present

## 2016-10-15 DIAGNOSIS — Z3A30 30 weeks gestation of pregnancy: Secondary | ICD-10-CM | POA: Insufficient documentation

## 2016-10-15 DIAGNOSIS — M545 Low back pain, unspecified: Secondary | ICD-10-CM

## 2016-10-15 DIAGNOSIS — Y999 Unspecified external cause status: Secondary | ICD-10-CM | POA: Insufficient documentation

## 2016-10-15 DIAGNOSIS — Y939 Activity, unspecified: Secondary | ICD-10-CM | POA: Diagnosis not present

## 2016-10-15 DIAGNOSIS — R040 Epistaxis: Secondary | ICD-10-CM | POA: Diagnosis not present

## 2016-10-15 DIAGNOSIS — O9A213 Injury, poisoning and certain other consequences of external causes complicating pregnancy, third trimester: Secondary | ICD-10-CM | POA: Insufficient documentation

## 2016-10-15 DIAGNOSIS — Z3403 Encounter for supervision of normal first pregnancy, third trimester: Secondary | ICD-10-CM

## 2016-10-15 DIAGNOSIS — Y92410 Unspecified street and highway as the place of occurrence of the external cause: Secondary | ICD-10-CM | POA: Diagnosis not present

## 2016-10-15 DIAGNOSIS — Z79899 Other long term (current) drug therapy: Secondary | ICD-10-CM | POA: Insufficient documentation

## 2016-10-15 DIAGNOSIS — M549 Dorsalgia, unspecified: Secondary | ICD-10-CM | POA: Diagnosis not present

## 2016-10-15 LAB — URINALYSIS COMPLETE WITH MICROSCOPIC (ARMC ONLY)
BILIRUBIN URINE: NEGATIVE
Glucose, UA: NEGATIVE mg/dL
Hgb urine dipstick: NEGATIVE
KETONES UR: NEGATIVE mg/dL
Leukocytes, UA: NEGATIVE
Nitrite: NEGATIVE
PROTEIN: NEGATIVE mg/dL
Specific Gravity, Urine: 1.012 (ref 1.005–1.030)
pH: 7 (ref 5.0–8.0)

## 2016-10-15 LAB — COMPREHENSIVE METABOLIC PANEL
ALT: 9 U/L — AB (ref 14–54)
AST: 20 U/L (ref 15–41)
Albumin: 2.8 g/dL — ABNORMAL LOW (ref 3.5–5.0)
Alkaline Phosphatase: 69 U/L (ref 38–126)
Anion gap: 8 (ref 5–15)
BUN: 6 mg/dL (ref 6–20)
CHLORIDE: 106 mmol/L (ref 101–111)
CO2: 21 mmol/L — ABNORMAL LOW (ref 22–32)
CREATININE: 0.72 mg/dL (ref 0.44–1.00)
Calcium: 8.5 mg/dL — ABNORMAL LOW (ref 8.9–10.3)
GFR calc Af Amer: >60 mL/min (ref >60)
Glucose, Bld: 92 mg/dL (ref 65–99)
Potassium: 3.2 mmol/L — ABNORMAL LOW (ref 3.5–5.1)
Sodium: 135 mmol/L (ref 135–145)
Total Bilirubin: 0.4 mg/dL (ref 0.3–1.2)
Total Protein: 7.4 g/dL (ref 6.5–8.1)

## 2016-10-15 LAB — CBC WITH DIFFERENTIAL/PLATELET
Basophils Absolute: 0 10*3/uL (ref 0–0.1)
Basophils Relative: 0 %
EOS ABS: 0.2 10*3/uL (ref 0–0.7)
EOS PCT: 1 %
HCT: 32.7 % — ABNORMAL LOW (ref 35.0–47.0)
Hemoglobin: 11.2 g/dL — ABNORMAL LOW (ref 12.0–16.0)
LYMPHS ABS: 2.7 10*3/uL (ref 1.0–3.6)
Lymphocytes Relative: 22 %
MCH: 31.4 pg (ref 26.0–34.0)
MCHC: 34.3 g/dL (ref 32.0–36.0)
MCV: 91.6 fL (ref 80.0–100.0)
MONO ABS: 0.5 10*3/uL (ref 0.2–0.9)
MONOS PCT: 4 %
Neutro Abs: 9.2 10*3/uL — ABNORMAL HIGH (ref 1.4–6.5)
Neutrophils Relative %: 73 %
PLATELETS: 343 10*3/uL (ref 150–440)
RBC: 3.57 MIL/uL — ABNORMAL LOW (ref 3.80–5.20)
RDW: 14.1 % (ref 11.5–14.5)
WBC: 12.7 10*3/uL — AB (ref 3.6–11.0)

## 2016-10-15 MED ORDER — SODIUM CHLORIDE 0.9 % IV SOLN
Freq: Once | INTRAVENOUS | Status: AC
  Administered 2016-10-15: 08:00:00 via INTRAVENOUS

## 2016-10-15 MED ORDER — OXYCODONE-ACETAMINOPHEN 5-325 MG PO TABS
1.0000 | ORAL_TABLET | Freq: Four times a day (QID) | ORAL | Status: DC | PRN
Start: 2016-10-15 — End: 2016-10-15
  Administered 2016-10-15: 2 via ORAL

## 2016-10-15 MED ORDER — ACETAMINOPHEN-CODEINE #3 300-30 MG PO TABS: 1.0000 | ORAL_TABLET | Freq: Four times a day (QID) | ORAL | 0 refills | Status: DC | PRN

## 2016-10-15 MED ORDER — PROMETHAZINE HCL 25 MG/ML IJ SOLN
25.0000 mg | Freq: Once | INTRAMUSCULAR | Status: AC
  Administered 2016-10-15: 25 mg via INTRAVENOUS
  Filled 2016-10-15: qty 1

## 2016-10-15 MED ORDER — OXYCODONE-ACETAMINOPHEN 5-325 MG PO TABS
ORAL_TABLET | ORAL | Status: AC
  Administered 2016-10-15: 2 via ORAL
  Filled 2016-10-15: qty 2
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
## 2016-10-15 NOTE — ED Notes (Signed)
Spoke with [place]jenna, rn in ob, they will see pt in ob at this time.
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
## 2016-10-15 NOTE — Discharge Instructions (Signed)
Take over the counter Tylenol Extra Strength for backache as needed. You will be sore for the next couple as days.   [facility] to make an appointment Monday or Tuesday so they can evaluate you.  If you have any bleeding. Discharge of fluids, decrease fetal movement go to you OB office or hospital if after hours.   Get plenty of rest and fluids.  Floy SabinaEvelyn Eloyse Causey, RN
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
## 2016-10-27 ENCOUNTER — Ambulatory Visit (INDEPENDENT_AMBULATORY_CARE_PROVIDER_SITE_OTHER): Payer: Medicaid Other | Admitting: Obstetrics & Gynecology

## 2016-10-27 VITALS — BP 124/79 | HR 97 | Wt 184.0 lb

## 2016-10-27 DIAGNOSIS — Z8619 Personal history of other infectious and parasitic diseases: Secondary | ICD-10-CM

## 2016-10-27 DIAGNOSIS — Z3403 Encounter for supervision of normal first pregnancy, third trimester: Secondary | ICD-10-CM

## 2016-10-27 DIAGNOSIS — N760 Acute vaginitis: Secondary | ICD-10-CM

## 2016-10-27 DIAGNOSIS — B9689 Other specified bacterial agents as the cause of diseases classified elsewhere: Secondary | ICD-10-CM

## 2016-10-27 MED ORDER — METRONIDAZOLE 500 MG PO TABS: 500.0000 mg | ORAL_TABLET | Freq: Two times a day (BID) | ORAL | 0 refills | Status: DC

## 2016-10-27 NOTE — Progress Notes (Signed)
C/O vaginal irritation with white discharge
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
## 2016-11-06 ENCOUNTER — Encounter: Payer: Self-pay | Admitting: Obstetrics and Gynecology

## 2016-11-06 ENCOUNTER — Encounter: Payer: Medicaid Other | Admitting: Obstetrics and Gynecology

## 2016-11-06 NOTE — Progress Notes (Signed)
Patient did not keep OB appointment for 11/06/2016.  Cornelia Copaharlie Xavien Dauphinais, Jr MD Attending [facility](Faculty Practice)

## 2016-11-14 ENCOUNTER — Other Ambulatory Visit (HOSPITAL_COMMUNITY)
Admission: RE | Admit: 2016-11-14 | Discharge: 2016-11-14 | Disposition: A | Discharge status: Home / Self Care | Payer: Medicaid Other | Source: Ambulatory Visit | Attending: Obstetrics & Gynecology | Admitting: Obstetrics & Gynecology

## 2016-11-14 ENCOUNTER — Ambulatory Visit (INDEPENDENT_AMBULATORY_CARE_PROVIDER_SITE_OTHER): Payer: Medicaid Other | Admitting: Obstetrics & Gynecology

## 2016-11-14 VITALS — BP 124/77 | HR 108 | Wt 184.0 lb

## 2016-11-14 DIAGNOSIS — Z8619 Personal history of other infectious and parasitic diseases: Secondary | ICD-10-CM

## 2016-11-14 DIAGNOSIS — Z3403 Encounter for supervision of normal first pregnancy, third trimester: Secondary | ICD-10-CM

## 2016-11-14 DIAGNOSIS — Z113 Encounter for screening for infections with a predominantly sexual mode of transmission: Secondary | ICD-10-CM | POA: Insufficient documentation

## 2016-11-14 LAB — OB RESULTS CONSOLE GBS: STREP GROUP B AG: POSITIVE

## 2016-11-14 LAB — OB RESULTS CONSOLE GC/CHLAMYDIA: GC PROBE AMP, GENITAL: NEGATIVE

## 2016-11-14 NOTE — Patient Instructions (Signed)
Return to clinic for any scheduled appointments or obstetric concerns, or go to [facility] for evaluation  
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
## 2016-11-15 LAB — GC/CHLAMYDIA PROBE AMP (~~LOC~~) NOT AT ARMC
CHLAMYDIA, DNA PROBE: NEGATIVE
NEISSERIA GONORRHEA: NEGATIVE

## 2016-11-17 ENCOUNTER — Encounter: Payer: Self-pay | Admitting: Obstetrics & Gynecology

## 2016-11-17 DIAGNOSIS — O99820 Streptococcus B carrier state complicating pregnancy: Secondary | ICD-10-CM | POA: Insufficient documentation

## 2016-11-17 LAB — CULTURE, BETA STREP (GROUP B ONLY)

## 2016-11-21 ENCOUNTER — Ambulatory Visit (INDEPENDENT_AMBULATORY_CARE_PROVIDER_SITE_OTHER): Payer: Medicaid Other | Admitting: Obstetrics and Gynecology

## 2016-11-21 VITALS — BP 129/81 | HR 108 | Wt 188.0 lb

## 2016-11-21 DIAGNOSIS — O99820 Streptococcus B carrier state complicating pregnancy: Secondary | ICD-10-CM

## 2016-11-21 DIAGNOSIS — Z8619 Personal history of other infectious and parasitic diseases: Secondary | ICD-10-CM

## 2016-11-21 DIAGNOSIS — Z3403 Encounter for supervision of normal first pregnancy, third trimester: Secondary | ICD-10-CM

## 2016-11-21 MED ORDER — MICONAZOLE NITRATE 2 % VA CREA: 1.0000 | TOPICAL_CREAM | Freq: Every day | VAGINAL | 0 refills | Status: DC

## 2016-11-21 NOTE — Progress Notes (Signed)
Pt states 1000mg  Valtrex is causing leg pain. She would also like to be checked for vaginal yeast infection.

## 2016-11-21 NOTE — Progress Notes (Signed)
Prenatal Visit Note Date: 11/21/2016 Clinic: [facility]  Subjective:  Jody Perez is a 25 y.o. G3P1011 at 9231w1d being seen today for ongoing prenatal care.  She is currently monitored for the following issues for this low-risk pregnancy and has History of genital HSV; Supervision of normal pregnancy; and Group B Streptococcus carrier, +RV culture, currently pregnant on her problem list.  Patient reports desires cx check and feels like yeast infection (d/c, itching and c/w prior s/s) Contractions: Irregular. Vag. Bleeding: None.  Movement: Present. Denies leaking of fluid.   The following portions of the patient's history were reviewed and updated as appropriate: allergies, current medications, past family history, past medical history, past social history, past surgical history and problem list. Problem list updated.  Objective:   Vitals:   11/21/16 1058  BP: 129/81  Pulse: (!) 108  Weight: 188 lb (85.3 kg)    Fetal Status: Fetal Heart Rate (bpm): 143 Fundal Height: 38 cm Movement: Present  Presentation: Vertex  General:  Alert, oriented and cooperative. Patient is in no acute distress.  Skin: Skin is warm and dry. No rash noted.   Cardiovascular: Normal heart rate noted  Respiratory: Normal respiratory effort, no problems with respiration noted  Abdomen: Soft, gravid, appropriate for gestational age. Pain/Pressure: Present     Pelvic:  Cervical exam performed Dilation: Fingertip Effacement (%): Thick Station: -3+erythema on b/l labia majora and white, cottage cheese d/c in vault.   Extremities: Normal range of motion.  Edema: Trace  Mental Status: Normal mood and affect. Normal behavior. Normal judgment and thought content.   Urinalysis:      Assessment and Plan:  Pregnancy: G3P1011 at 1031w1d  1. Encounter for supervision of normal first pregnancy in third trimester Routine care. Undecided about BC. Monistat 7 for yeast infection  2. History of genital  HSV Continue valtrex  3. Group B Streptococcus carrier, +RV culture, currently pregnant tx in labor.   Term labor symptoms and general obstetric precautions including but not limited to vaginal bleeding, contractions, leaking of fluid and fetal movement were reviewed in detail with the patient. Please refer to After Visit Summary for other counseling recommendations.  Return in about 1 week (around 11/28/2016) for 7-10d rob.   [place]land Bingharlie Lavonda Thal, MD

## 2016-11-25 ENCOUNTER — Inpatient Hospital Stay (HOSPITAL_COMMUNITY)
Admission: AD | Admit: 2016-11-25 | Discharge: 2016-11-25 | Disposition: A | Discharge status: Home / Self Care | Payer: Medicaid Other | Source: Ambulatory Visit | Attending: Obstetrics and Gynecology | Admitting: Obstetrics and Gynecology

## 2016-11-25 ENCOUNTER — Encounter (HOSPITAL_COMMUNITY): Payer: Self-pay | Admitting: *Deleted

## 2016-11-25 DIAGNOSIS — Z3403 Encounter for supervision of normal first pregnancy, third trimester: Secondary | ICD-10-CM

## 2016-11-25 DIAGNOSIS — N898 Other specified noninflammatory disorders of vagina: Secondary | ICD-10-CM

## 2016-11-25 DIAGNOSIS — O26893 Other specified pregnancy related conditions, third trimester: Secondary | ICD-10-CM | POA: Diagnosis not present

## 2016-11-25 DIAGNOSIS — O9989 Other specified diseases and conditions complicating pregnancy, childbirth and the puerperium: Secondary | ICD-10-CM | POA: Diagnosis not present

## 2016-11-25 DIAGNOSIS — Z3A38 38 weeks gestation of pregnancy: Secondary | ICD-10-CM

## 2016-11-25 DIAGNOSIS — Z3689 Encounter for other specified antenatal screening: Secondary | ICD-10-CM

## 2016-11-25 DIAGNOSIS — O99820 Streptococcus B carrier state complicating pregnancy: Secondary | ICD-10-CM

## 2016-11-25 LAB — WET PREP, GENITAL
Clue Cells Wet Prep HPF POC: NONE SEEN
Sperm: NONE SEEN
Trich, Wet Prep: NONE SEEN
Yeast Wet Prep HPF POC: NONE SEEN

## 2016-11-25 LAB — URINALYSIS, ROUTINE W REFLEX MICROSCOPIC
BILIRUBIN URINE: NEGATIVE
GLUCOSE, UA: NEGATIVE mg/dL
HGB URINE DIPSTICK: NEGATIVE
KETONES UR: NEGATIVE mg/dL
Nitrite: NEGATIVE
PH: 7 (ref 5.0–8.0)
Protein, ur: NEGATIVE mg/dL
Specific Gravity, Urine: 1.012 (ref 1.005–1.030)

## 2016-11-25 NOTE — MAU Provider Note (Signed)
History     CSN: [id number]  Arrival date and time: 11/25/16 1619   None     Chief Complaint  Patient presents with  . Vaginal Discharge   G3P1011 @38 .5 weeks here for LOF. She reports leaking small amounts of watery fluid off and on since 0900. She reports good FM. No VB or regular ctx. She denies recent IC. She reports recently being treated for yeast infection and finished vaginal cream 3 days ago.   OB History    Gravida Para Term Preterm AB Living   3 1 1   1 1    SAB TAB Ectopic Multiple Live Births   1       1      Past Medical History:  Diagnosis Date  . BV (bacterial vaginosis)   . Chlamydia   . Herpes genitalis in women   . HSV infection   . Miscarriage   . Trichomonas   . UTI (lower urinary tract infection)     Past Surgical History:  Procedure Laterality Date  . NO PAST SURGERIES      Family History  Problem Relation Age of Onset  . Hypertension Mother   . Hypertension Brother     Social History  Substance Use Topics  . Smoking status: Never Smoker  . Smokeless tobacco: Never Used  . Alcohol use No     Comment: socially    Allergies: No Known Allergies  Prescriptions Prior to Admission  Medication Sig Dispense Refill Last Dose  . acetaminophen-codeine (TYLENOL #3) 300-30 MG tablet Take 1-2 tablets by mouth every 6 (six) hours as needed for moderate pain. 20 tablet 0 Taking  . metroNIDAZOLE (FLAGYL) 500 MG tablet Take 1 tablet (500 mg total) by mouth 2 (two) times daily. 14 tablet 0 Taking  . miconazole (MONISTAT 7) 2 % vaginal cream Place 1 Applicatorful vaginally at bedtime. 45 g 0   . Prenatal Vit-Fe Fumarate-FA (PRENATAL MULTIVITAMIN) TABS tablet Take 1 tablet by mouth daily at 12 noon.   Taking  . valACYclovir (VALTREX) 1000 MG tablet Take 1,000 mg by mouth daily.   Taking    Review of Systems  Constitutional: Negative.   Gastrointestinal: Negative.   Genitourinary: Negative.    Physical Exam   Blood pressure 123/87, pulse (!)  129, temperature 98 F (36.7 C), temperature source Oral, resp. rate 18, last menstrual period 02/09/2016.  Physical Exam  Constitutional: She is oriented to person, place, and time. She appears well-developed and well-nourished. No distress.  HENT:  Head: Normocephalic and atraumatic.  Neck: Normal range of motion.  Cardiovascular: Normal rate.   Respiratory: Effort normal.  GI: Soft. She exhibits no distension. There is no tenderness.  gravid  Genitourinary:  Genitourinary Comments: External: no lesions or erythema Vagina: rugated, parous, thin white discharge, no pool, fern neg SVE: FT/thick   Musculoskeletal: Normal range of motion.  Neurological: She is alert and oriented to person, place, and time.  Skin: Skin is warm and dry.  Psychiatric: She has a normal mood and affect.   EFM: 135 bpm, mod variability, + accels, no decels Toco: irritability  Results for orders placed or performed during the hospital encounter of 11/25/16 (from the past 24 hour(s))  Urinalysis, Routine w reflex microscopic     Status: Abnormal   Collection Time: 11/25/16  4:25 PM  Result Value Ref Range   Color, Urine YELLOW YELLOW   APPearance CLEAR CLEAR   Specific Gravity, Urine 1.012 1.005 - 1.030   pH  7.0 5.0 - 8.0   Glucose, UA NEGATIVE NEGATIVE mg/dL   Hgb urine dipstick NEGATIVE NEGATIVE   Bilirubin Urine NEGATIVE NEGATIVE   Ketones, ur NEGATIVE NEGATIVE mg/dL   Protein, ur NEGATIVE NEGATIVE mg/dL   Nitrite NEGATIVE NEGATIVE   Leukocytes, UA SMALL (A) NEGATIVE   RBC / HPF 0-5 0 - 5 RBC/hpf   WBC, UA 0-5 0 - 5 WBC/hpf   Bacteria, UA RARE (A) NONE SEEN   Squamous Epithelial / LPF 6-30 (A) NONE SEEN   Mucous PRESENT   Wet prep, genital     Status: Abnormal   Collection Time: 11/25/16  5:11 PM  Result Value Ref Range   Yeast Wet Prep HPF POC NONE SEEN NONE SEEN   Trich, Wet Prep NONE SEEN NONE SEEN   Clue Cells Wet Prep HPF POC NONE SEEN NONE SEEN   WBC, Wet Prep HPF POC MODERATE (A)  NONE SEEN   Sperm NONE SEEN     MAU Course  Procedures  MDM Labs ordered and reviewed. No evidence of SROM or labor. Stable for discharge home.   Assessment and Plan  [redacted] weeks gestation Reactive NST Leukorrhea  Discharge home Labor precautions Follow up in office as scheduled in 4 days  Allergies as of 11/25/2016   No Known Allergies     Medication List    TAKE these medications   acetaminophen-codeine 300-30 MG tablet Commonly known as:  TYLENOL #3 Take 1-2 tablets by mouth every 6 (six) hours as needed for moderate pain.   metroNIDAZOLE 500 MG tablet Commonly known as:  FLAGYL Take 1 tablet (500 mg total) by mouth 2 (two) times daily.   miconazole 2 % vaginal cream Commonly known as:  MONISTAT 7 Place 1 Applicatorful vaginally at bedtime.   prenatal multivitamin Tabs tablet Take 1 tablet by mouth daily at 12 noon.   valACYclovir 1000 MG tablet Commonly known as:  VALTREX Take 1,000 mg by mouth daily.      Donette LarryMelanie Vincy Feliz, CNM 11/25/2016, 5:11 PM

## 2016-11-25 NOTE — MAU Note (Signed)
Having 2 loose  Stools per day for 2 days.  Vomiting all day (thoughout pregnancy).  Groin pain for a while, feels dehydrated.  Leaking clear fluid since about 10 am.  Lower back cramping since 10 am. Denies vaginal bleeding. No intercourse x 1 month.

## 2016-11-25 NOTE — Discharge Instructions (Signed)
Braxton Hicks Contractions °Contractions of the uterus can occur throughout pregnancy. Contractions are not always a sign that you are in labor.  °WHAT ARE BRAXTON HICKS CONTRACTIONS?  °Contractions that occur before labor are called Braxton Hicks contractions, or false labor. Toward the end of pregnancy (32-34 weeks), these contractions can develop more often and may become more forceful. This is not true labor because these contractions do not result in opening (dilatation) and thinning of the cervix. They are sometimes difficult to tell apart from true labor because these contractions can be forceful and people have different pain tolerances. You should not feel embarrassed if you go to the hospital with false labor. Sometimes, the only way to tell if you are in true labor is for your health care provider to look for changes in the cervix. °If there are no prenatal problems or other health problems associated with the pregnancy, it is completely safe to be sent home with false labor and await the onset of true labor. °HOW CAN YOU TELL THE DIFFERENCE BETWEEN TRUE AND FALSE LABOR? °False Labor  °· The contractions of false labor are usually shorter and not as hard as those of true labor.   °· The contractions are usually irregular.   °· The contractions are often felt in the front of the lower abdomen and in the groin.   °· The contractions may go away when you walk around or change positions while lying down.   °· The contractions get weaker and are shorter lasting as time goes on.   °· The contractions do not usually become progressively stronger, regular, and closer together as with true labor.   °True Labor  °· Contractions in true labor last 30-70 seconds, become very regular, usually become more intense, and increase in frequency.   °· The contractions do not go away with walking.   °· The discomfort is usually felt in the top of the uterus and spreads to the lower abdomen and low back.   °· True labor can be  determined by your health care provider with an exam. This will show that the cervix is dilating and getting thinner.   °WHAT TO REMEMBER °· Keep up with your usual exercises and follow other instructions given by your health care provider.   °· Take medicines as directed by your health care provider.   °· Keep your regular prenatal appointments.   °· Eat and drink lightly if you think you are going into labor.   °· If Braxton Hicks contractions are making you uncomfortable:   °¨ Change your position from lying down or resting to walking, or from walking to resting.   °¨ Sit and rest in a tub of warm water.   °¨ Drink 2-3 glasses of water. Dehydration may cause these contractions.   °¨ Do slow and deep breathing several times an hour.   °WHEN SHOULD I SEEK IMMEDIATE MEDICAL CARE? °Seek immediate medical care if: °· Your contractions become stronger, more regular, and closer together.   °· You have fluid leaking or gushing from your vagina.   °· You have a fever.   °· You pass blood-tinged mucus.   °· You have vaginal bleeding.   °· You have continuous abdominal pain.   °· You have low back pain that you never had before.   °· You feel your baby's head pushing down and causing pelvic pressure.   °· Your baby is not moving as much as it used to.   °This information is not intended to replace advice given to you by your health care provider. Make sure you discuss any questions you have with your health care   provider. °Document Released: 11/13/2005 Document Revised: 03/06/2016 Document Reviewed: 08/25/2013 °Elsevier Interactive Patient Education © 2017 [employer] ° °

## 2016-11-27 NOTE — L&D Delivery Note (Signed)
Delivery Note Called to bedside for FHR decels to 80bpm with UCs, multiple maternal postiion changes, oxygen and bolus in progress. SVE done- fetus at +1 station with large Ant lip, cervix manually reduced, pt pushed well, cervix remained reduced.   First Stage: Labor onset: 2330 Augmentation : None Analgesia /Anesthesia intrapartum: epidural SROM at 11/17/17 at 1630  Second Stage:  Complete dilation at 0314 Onset of pushing at 0314 FHR second stage Cat II, variable and early decels with moderate variability  Delivery of a viable female 11/18/17 at 0439 by R. Oriah Leinweber CNM delivery of fetal head in LOA position with restitution to LOT. No nuchal cord;  Anterior then posterior shoulders delivered easily with gentle downward traction. Baby placed on mom's chest, and attended to by peds.  Cord double clamped after cessation of pulsation, cut by CNM. Cord blood sample collected.   Third Stage: Placenta delivered Spontaneously intact with 3 VC @ 0459 Placenta disposition: disposal Uterine tone Firm / bleeding scant  no laceration identified; inspected cervix, vagina and perineum.  Est. Blood Loss (mL): 150  Complications: none  Mom to postpartum.  Baby to Couplet care / Skin to Skin.  Newborn: Birth Weight: 8#2oz  Apgar Scores: 7/8 Feeding planned: Breast

## 2016-11-27 NOTE — L&D Delivery Note (Signed)
26 y.o. G3P1011 at 4846w3d delivered a viable female infant in cephalic, OA position. Tight nuchal cord, reduced after delivery, delivered through with ease. Right anterior shoulder delivered with ease. 15 sec delayed cord clamping. Cord clamped x2 and cut. Baby taken to warmer immediately to be assessed by team. Placenta delivered spontaneously intact, with 3VC. Fundus firm on exam with massage and pitocin. Good hemostasis noted.  Laceration: None Suture: N/A Good hemostasis noted. EBL: 100cc  Mom and baby recovering in LDR.    Apgars: 6 /8 Weight: weight pending  Cord gas pH: 7.09    Jody MowElizabeth Aylene Acoff, [facility] 12/07/2016, 8:15 PM

## 2016-11-29 ENCOUNTER — Ambulatory Visit (INDEPENDENT_AMBULATORY_CARE_PROVIDER_SITE_OTHER): Payer: Medicaid Other | Admitting: Obstetrics & Gynecology

## 2016-11-29 VITALS — BP 122/77 | HR 114 | Wt 186.0 lb

## 2016-11-29 DIAGNOSIS — O99820 Streptococcus B carrier state complicating pregnancy: Secondary | ICD-10-CM

## 2016-11-29 DIAGNOSIS — Z8619 Personal history of other infectious and parasitic diseases: Secondary | ICD-10-CM

## 2016-11-29 DIAGNOSIS — Z3403 Encounter for supervision of normal first pregnancy, third trimester: Secondary | ICD-10-CM

## 2016-11-29 NOTE — Progress Notes (Signed)
Pt desire cervical check

## 2016-11-29 NOTE — Progress Notes (Signed)
   PRENATAL VISIT NOTE  Subjective:  Jody Perez is a 26 y.o. G3P1011 at 7760w2d being seen today for ongoing prenatal care.  She is currently monitored for the following issues for this low-risk pregnancy and has History of genital HSV; Supervision of normal pregnancy; and Group B Streptococcus carrier, +RV culture, currently pregnant on her problem list.  Patient reports no complaints.  Contractions: Not present. Vag. Bleeding: None.  Movement: Present. Denies leaking of fluid.   The following portions of the patient's history were reviewed and updated as appropriate: allergies, current medications, past family history, past medical history, past social history, past surgical history and problem list. Problem list updated.  Objective:   Vitals:   11/29/16 1038  BP: 122/77  Pulse: (!) 114  Weight: 186 lb (84.4 kg)    Fetal Status: Fetal Heart Rate (bpm): 149   Movement: Present     General:  Alert, oriented and cooperative. Patient is in no acute distress.  Skin: Skin is warm and dry. No rash noted.   Cardiovascular: Normal heart rate noted  Respiratory: Normal respiratory effort, no problems with respiration noted  Abdomen: Soft, gravid, appropriate for gestational age. Pain/Pressure: Present     Pelvic:  Cervical exam deferred        Extremities: Normal range of motion.  Edema: Trace  Mental Status: Normal mood and affect. Normal behavior. Normal judgment and thought content.   Assessment and Plan:  Pregnancy: G3P1011 at 6760w2d  1. Group B Streptococcus carrier, +RV culture, currently pregnant   2. History of genital HSV -on Valtrex  3. Encounter for supervision of normal first pregnancy in third trimester   Term labor symptoms and general obstetric precautions including but not limited to vaginal bleeding, contractions, leaking of fluid and fetal movement were reviewed in detail with the patient. Please refer to After Visit Summary for other counseling recommendations.    No Follow-up on file.   Allie BossierMyra C Camey Edell, MD

## 2016-12-04 ENCOUNTER — Encounter: Payer: Medicaid Other | Admitting: Obstetrics & Gynecology

## 2016-12-05 ENCOUNTER — Encounter (HOSPITAL_COMMUNITY): Payer: Self-pay | Admitting: *Deleted

## 2016-12-05 ENCOUNTER — Telehealth (HOSPITAL_COMMUNITY): Payer: Self-pay | Admitting: *Deleted

## 2016-12-05 NOTE — Telephone Encounter (Signed)
Preadmission screen  

## 2016-12-07 ENCOUNTER — Encounter: Payer: Medicaid Other | Admitting: Obstetrics & Gynecology

## 2016-12-07 ENCOUNTER — Inpatient Hospital Stay (HOSPITAL_COMMUNITY): Payer: Medicaid Other | Admitting: Anesthesiology

## 2016-12-07 ENCOUNTER — Encounter (HOSPITAL_COMMUNITY): Payer: Self-pay | Admitting: *Deleted

## 2016-12-07 ENCOUNTER — Inpatient Hospital Stay (HOSPITAL_COMMUNITY)
Admission: AD | Admit: 2016-12-07 | Discharge: 2016-12-09 | DRG: 774 | Disposition: A | Discharge status: Home / Self Care | Payer: Medicaid Other | Source: Ambulatory Visit | Attending: Obstetrics and Gynecology | Admitting: Obstetrics and Gynecology

## 2016-12-07 DIAGNOSIS — O411230 Chorioamnionitis, third trimester, not applicable or unspecified: Secondary | ICD-10-CM | POA: Diagnosis present

## 2016-12-07 DIAGNOSIS — O9832 Other infections with a predominantly sexual mode of transmission complicating childbirth: Secondary | ICD-10-CM | POA: Diagnosis present

## 2016-12-07 DIAGNOSIS — Z8249 Family history of ischemic heart disease and other diseases of the circulatory system: Secondary | ICD-10-CM

## 2016-12-07 DIAGNOSIS — O99824 Streptococcus B carrier state complicating childbirth: Secondary | ICD-10-CM | POA: Diagnosis present

## 2016-12-07 DIAGNOSIS — O99820 Streptococcus B carrier state complicating pregnancy: Secondary | ICD-10-CM

## 2016-12-07 DIAGNOSIS — Z3A40 40 weeks gestation of pregnancy: Secondary | ICD-10-CM

## 2016-12-07 DIAGNOSIS — A6000 Herpesviral infection of urogenital system, unspecified: Secondary | ICD-10-CM | POA: Diagnosis present

## 2016-12-07 DIAGNOSIS — O480 Post-term pregnancy: Secondary | ICD-10-CM

## 2016-12-07 DIAGNOSIS — Z3493 Encounter for supervision of normal pregnancy, unspecified, third trimester: Secondary | ICD-10-CM | POA: Diagnosis present

## 2016-12-07 LAB — CBC
HEMATOCRIT: 32 % — AB (ref 36.0–46.0)
HEMOGLOBIN: 10.4 g/dL — AB (ref 12.0–15.0)
MCH: 28.3 pg (ref 26.0–34.0)
MCHC: 32.5 g/dL (ref 30.0–36.0)
MCV: 87.2 fL (ref 78.0–100.0)
Platelets: 440 10*3/uL — ABNORMAL HIGH (ref 150–400)
RBC: 3.67 MIL/uL — AB (ref 3.87–5.11)
RDW: 15 % (ref 11.5–15.5)
WBC: 14.1 10*3/uL — ABNORMAL HIGH (ref 4.0–10.5)

## 2016-12-07 LAB — TYPE AND SCREEN
ABO/RH(D): O POS
ANTIBODY SCREEN: NEGATIVE

## 2016-12-07 LAB — RPR: RPR: NONREACTIVE

## 2016-12-07 MED ORDER — PRENATAL MULTIVITAMIN CH
1.0000 | ORAL_TABLET | Freq: Every day | ORAL | Status: DC
  Administered 2016-12-08 – 2016-12-09 (×2): 1 via ORAL
  Filled 2016-12-07 (×2): qty 1

## 2016-12-07 MED ORDER — FLEET ENEMA 7-19 GM/118ML RE ENEM: 1.0000 | ENEMA | RECTAL | Status: DC | PRN

## 2016-12-07 MED ORDER — TETANUS-DIPHTH-ACELL PERTUSSIS 5-2.5-18.5 LF-MCG/0.5 IM SUSP: 0.5000 mL | Freq: Once | INTRAMUSCULAR | Status: DC

## 2016-12-07 MED ORDER — OXYTOCIN 40 UNITS IN LACTATED RINGERS INFUSION - SIMPLE MED: 2.5000 [IU]/h | INTRAVENOUS | Status: DC | PRN

## 2016-12-07 MED ORDER — SIMETHICONE 80 MG PO CHEW: 80.0000 mg | CHEWABLE_TABLET | ORAL | Status: DC | PRN

## 2016-12-07 MED ORDER — EPHEDRINE 5 MG/ML INJ
10.0000 mg | INTRAVENOUS | Status: DC | PRN
  Filled 2016-12-07: qty 4

## 2016-12-07 MED ORDER — ONDANSETRON HCL 4 MG/2ML IJ SOLN
4.0000 mg | Freq: Four times a day (QID) | INTRAMUSCULAR | Status: DC | PRN
  Administered 2016-12-07 (×2): 4 mg via INTRAVENOUS
  Filled 2016-12-07: qty 2

## 2016-12-07 MED ORDER — LACTATED RINGERS IV SOLN
INTRAVENOUS | Status: DC
  Administered 2016-12-07 (×2): via INTRAVENOUS

## 2016-12-07 MED ORDER — ZOLPIDEM TARTRATE 5 MG PO TABS: 5.0000 mg | ORAL_TABLET | Freq: Every evening | ORAL | Status: DC | PRN

## 2016-12-07 MED ORDER — OXYTOCIN BOLUS FROM INFUSION
500.0000 mL | Freq: Once | INTRAVENOUS | Status: AC
  Administered 2016-12-07: 500 mL via INTRAVENOUS

## 2016-12-07 MED ORDER — SODIUM CHLORIDE 0.9% FLUSH: 3.0000 mL | Freq: Two times a day (BID) | INTRAVENOUS | Status: DC

## 2016-12-07 MED ORDER — OXYCODONE-ACETAMINOPHEN 5-325 MG PO TABS: 1.0000 | ORAL_TABLET | ORAL | Status: DC | PRN

## 2016-12-07 MED ORDER — LACTATED RINGERS IV SOLN: 500.0000 mL | Freq: Once | INTRAVENOUS | Status: DC

## 2016-12-07 MED ORDER — ONDANSETRON HCL 4 MG/2ML IJ SOLN
4.0000 mg | Freq: Once | INTRAMUSCULAR | Status: DC
  Filled 2016-12-07: qty 2

## 2016-12-07 MED ORDER — DIPHENHYDRAMINE HCL 50 MG/ML IJ SOLN
12.5000 mg | INTRAMUSCULAR | Status: AC | PRN
  Administered 2016-12-07 (×3): 12.5 mg via INTRAVENOUS
  Filled 2016-12-07 (×3): qty 1

## 2016-12-07 MED ORDER — LIDOCAINE HCL (PF) 1 % IJ SOLN
INTRAMUSCULAR | Status: DC | PRN
  Administered 2016-12-07 (×2): 5 mL

## 2016-12-07 MED ORDER — LIDOCAINE HCL (PF) 1 % IJ SOLN
30.0000 mL | INTRAMUSCULAR | Status: DC | PRN
Start: 2016-12-07 — End: 2016-12-07
  Filled 2016-12-07: qty 30

## 2016-12-07 MED ORDER — WITCH HAZEL-GLYCERIN EX PADS: 1.0000 "application " | MEDICATED_PAD | CUTANEOUS | Status: DC | PRN

## 2016-12-07 MED ORDER — COCONUT OIL OIL
1.0000 "application " | TOPICAL_OIL | Status: DC | PRN
  Administered 2016-12-09: 1 via TOPICAL
  Filled 2016-12-07: qty 120

## 2016-12-07 MED ORDER — OXYTOCIN 40 UNITS IN LACTATED RINGERS INFUSION - SIMPLE MED
2.5000 [IU]/h | INTRAVENOUS | Status: DC
  Filled 2016-12-07: qty 1000

## 2016-12-07 MED ORDER — FENTANYL 2.5 MCG/ML BUPIVACAINE 1/10 % EPIDURAL INFUSION (WH - ANES)
INTRAMUSCULAR | Status: AC
  Filled 2016-12-07: qty 100

## 2016-12-07 MED ORDER — LACTATED RINGERS IV BOLUS (SEPSIS)
1000.0000 mL | Freq: Once | INTRAVENOUS | Status: AC
  Administered 2016-12-07: 1000 mL via INTRAVENOUS

## 2016-12-07 MED ORDER — DIBUCAINE 1 % RE OINT: 1.0000 "application " | TOPICAL_OINTMENT | RECTAL | Status: DC | PRN

## 2016-12-07 MED ORDER — DIPHENHYDRAMINE HCL 25 MG PO CAPS: 25.0000 mg | ORAL_CAPSULE | Freq: Four times a day (QID) | ORAL | Status: DC | PRN

## 2016-12-07 MED ORDER — IBUPROFEN 600 MG PO TABS
600.0000 mg | ORAL_TABLET | Freq: Four times a day (QID) | ORAL | Status: DC
  Administered 2016-12-07 – 2016-12-09 (×7): 600 mg via ORAL
  Filled 2016-12-07 (×8): qty 1

## 2016-12-07 MED ORDER — PHENYLEPHRINE 40 MCG/ML (10ML) SYRINGE FOR IV PUSH (FOR BLOOD PRESSURE SUPPORT)
PREFILLED_SYRINGE | INTRAVENOUS | Status: AC
  Filled 2016-12-07: qty 10

## 2016-12-07 MED ORDER — DEXTROSE 5 % IV SOLN
5.0000 10*6.[IU] | Freq: Once | INTRAVENOUS | Status: AC
  Administered 2016-12-07: 5 10*6.[IU] via INTRAVENOUS
  Filled 2016-12-07: qty 5

## 2016-12-07 MED ORDER — SODIUM CHLORIDE 0.9% FLUSH: 3.0000 mL | INTRAVENOUS | Status: DC | PRN

## 2016-12-07 MED ORDER — SOD CITRATE-CITRIC ACID 500-334 MG/5ML PO SOLN: 30.0000 mL | ORAL | Status: DC | PRN

## 2016-12-07 MED ORDER — OXYCODONE-ACETAMINOPHEN 5-325 MG PO TABS: 2.0000 | ORAL_TABLET | ORAL | Status: DC | PRN

## 2016-12-07 MED ORDER — GENTAMICIN SULFATE 40 MG/ML IJ SOLN
160.0000 mg | Freq: Three times a day (TID) | INTRAVENOUS | Status: DC
  Administered 2016-12-07: 160 mg via INTRAVENOUS
  Filled 2016-12-07 (×2): qty 4

## 2016-12-07 MED ORDER — FENTANYL CITRATE (PF) 100 MCG/2ML IJ SOLN: 100.0000 ug | INTRAMUSCULAR | Status: DC | PRN

## 2016-12-07 MED ORDER — ONDANSETRON HCL 4 MG PO TABS: 4.0000 mg | ORAL_TABLET | ORAL | Status: DC | PRN

## 2016-12-07 MED ORDER — LACTATED RINGERS IV SOLN: 500.0000 mL | INTRAVENOUS | Status: DC | PRN

## 2016-12-07 MED ORDER — PHENYLEPHRINE 40 MCG/ML (10ML) SYRINGE FOR IV PUSH (FOR BLOOD PRESSURE SUPPORT)
80.0000 ug | PREFILLED_SYRINGE | INTRAVENOUS | Status: DC | PRN
  Filled 2016-12-07: qty 5

## 2016-12-07 MED ORDER — SODIUM CHLORIDE 0.9 % IV SOLN
2.0000 g | Freq: Four times a day (QID) | INTRAVENOUS | Status: DC
  Administered 2016-12-07: 2 g via INTRAVENOUS
  Filled 2016-12-07 (×2): qty 2000

## 2016-12-07 MED ORDER — BENZOCAINE-MENTHOL 20-0.5 % EX AERO
1.0000 "application " | INHALATION_SPRAY | CUTANEOUS | Status: DC | PRN
  Administered 2016-12-07: 1 via TOPICAL
  Filled 2016-12-07: qty 56

## 2016-12-07 MED ORDER — PHENYLEPHRINE 40 MCG/ML (10ML) SYRINGE FOR IV PUSH (FOR BLOOD PRESSURE SUPPORT)
80.0000 ug | PREFILLED_SYRINGE | INTRAVENOUS | Status: DC | PRN
Start: 2016-12-07 — End: 2016-12-07
  Filled 2016-12-07: qty 5

## 2016-12-07 MED ORDER — SENNOSIDES-DOCUSATE SODIUM 8.6-50 MG PO TABS
2.0000 | ORAL_TABLET | ORAL | Status: DC
  Administered 2016-12-07 – 2016-12-09 (×2): 2 via ORAL
  Filled 2016-12-07 (×2): qty 2

## 2016-12-07 MED ORDER — TERBUTALINE SULFATE 1 MG/ML IJ SOLN
0.2500 mg | Freq: Once | INTRAMUSCULAR | Status: DC | PRN
  Filled 2016-12-07: qty 1

## 2016-12-07 MED ORDER — PENICILLIN G POT IN DEXTROSE 60000 UNIT/ML IV SOLN
3.0000 10*6.[IU] | INTRAVENOUS | Status: DC
  Administered 2016-12-07 (×2): 3 10*6.[IU] via INTRAVENOUS
  Filled 2016-12-07 (×5): qty 50

## 2016-12-07 MED ORDER — SODIUM CHLORIDE 0.9 % IV SOLN: 250.0000 mL | INTRAVENOUS | Status: DC | PRN

## 2016-12-07 MED ORDER — ACETAMINOPHEN 325 MG PO TABS
650.0000 mg | ORAL_TABLET | ORAL | Status: DC | PRN
  Administered 2016-12-07: 650 mg via ORAL
  Filled 2016-12-07: qty 2

## 2016-12-07 MED ORDER — ONDANSETRON HCL 4 MG/2ML IJ SOLN: 4.0000 mg | INTRAMUSCULAR | Status: DC | PRN

## 2016-12-07 MED ORDER — ACETAMINOPHEN 325 MG PO TABS
650.0000 mg | ORAL_TABLET | ORAL | Status: DC | PRN
  Administered 2016-12-08 – 2016-12-09 (×2): 650 mg via ORAL
  Filled 2016-12-07 (×2): qty 2

## 2016-12-07 MED ORDER — OXYTOCIN 40 UNITS IN LACTATED RINGERS INFUSION - SIMPLE MED
1.0000 m[IU]/min | INTRAVENOUS | Status: DC
  Administered 2016-12-07: 2 m[IU]/min via INTRAVENOUS

## 2016-12-07 MED ORDER — FENTANYL 2.5 MCG/ML BUPIVACAINE 1/10 % EPIDURAL INFUSION (WH - ANES)
14.0000 mL/h | INTRAMUSCULAR | Status: DC | PRN
  Administered 2016-12-07 (×3): 14 mL/h via EPIDURAL
  Filled 2016-12-07 (×2): qty 100

## 2016-12-07 MED ORDER — FENTANYL CITRATE (PF) 100 MCG/2ML IJ SOLN
100.0000 ug | INTRAMUSCULAR | Status: DC | PRN
  Administered 2016-12-07: 100 ug via INTRAVENOUS
  Filled 2016-12-07: qty 2

## 2016-12-07 NOTE — H&P (Signed)
Jody Perez is a 26 y.o. female G3P1011 @ 40.3wks presenting for reg ctx. Denies leaking or bldg. No H/A, N/V or visual disturbances. [facility] and has been remarkable for 1) GBS pos 2) hx HSV- no current outbreak   OB History    Gravida Para Term Preterm AB Living   3 1 1   1 1    SAB TAB Ectopic Multiple Live Births   1       1     Past Medical History:  Diagnosis Date  . BV (bacterial vaginosis)   . Chlamydia   . Herpes genitalis in women   . HSV infection   . Miscarriage   . Trichomonas   . UTI (lower urinary tract infection)    Past Surgical History:  Procedure Laterality Date  . NO PAST SURGERIES     Family History: family history includes Hypertension in her brother and mother. Social History:  reports that she has never smoked. She has never used smokeless tobacco. She reports that she does not drink alcohol or use drugs.     Maternal Diabetes: No Genetic Screening: Normal Maternal Ultrasounds/Referrals: Normal Fetal Ultrasounds or other Referrals:  None Maternal Substance Abuse:  No Significant Maternal Medications:  None Significant Maternal Lab Results:  Lab values include: Group B Strep positive Other Comments:  None  ROS History Dilation: 5 Effacement (%): 90 Exam by:: smith,cnm Blood pressure 117/66, pulse 76, temperature 98.9 F (37.2 C), temperature source Oral, resp. rate 18, height 5\' 4"  (1.626 m), weight 84.4 kg (186 lb), last menstrual period 02/09/2016, SpO2 100 %. Exam Physical Exam  Constitutional: She is oriented to person, place, and time. She appears well-developed.  HENT:  Head: Normocephalic.  Neck: Normal range of motion.  Cardiovascular: Normal rate.   Respiratory: Effort normal.  GI:  EFM 135-145, +accels, no decels Ctx q 3-5 mins  Musculoskeletal: Normal range of motion.  Neurological: She is alert and oriented to person, place, and time.  Skin: Skin is warm and dry.  Psychiatric: She  has a normal mood and affect. Her behavior is normal. Thought content normal.    Prenatal labs: ABO, Rh: --/--/O POS (01/11 0420) Antibody: NEG (01/11 0420) Rubella: 3.95 (06/07 0948) RPR: NON REAC (10/18 1126)  HBsAg: NEGATIVE (06/07 0948)  HIV: NONREACTIVE (10/18 1126)  GBS: Positive (12/19 0000)   Assessment/Plan: IUP@40 .3wks Early labor GBS pos  Admit to [facility] for GBS ppx Expectant management Epidural for pain Anticipate SVD   SHAW, KIMBERLY CNM 12/07/2016, 6:17 AM

## 2016-12-07 NOTE — Progress Notes (Signed)
Evaluated patient and she is feeling well.  Pain well controlled. Cervical exam revealed 8cm dilated cervix and -1 station. Placed IUPC without complications. Will continue to monitor.

## 2016-12-07 NOTE — Anesthesia Preprocedure Evaluation (Signed)
Anesthesia Evaluation  Patient identified by MRN, date of birth, ID band Patient awake    Reviewed: Allergy & Precautions, H&P , NPO status , Patient's Chart, lab work & pertinent test results  History of Anesthesia Complications Negative for: history of anesthetic complications  Airway Mallampati: II  TM Distance: >3 FB Neck ROM: full    Dental no notable dental hx. (+) Teeth Intact   Pulmonary neg pulmonary ROS,    Pulmonary exam normal breath sounds clear to auscultation       Cardiovascular negative cardio ROS Normal cardiovascular exam Rhythm:regular Rate:Normal     Neuro/Psych negative neurological ROS  negative psych ROS   GI/Hepatic negative GI ROS, Neg liver ROS,   Endo/Other  negative endocrine ROS  Renal/GU negative Renal ROS  negative genitourinary   Musculoskeletal   Abdominal   Peds  Hematology negative hematology ROS (+)   Anesthesia Other Findings   Reproductive/Obstetrics (+) Pregnancy                             Anesthesia Physical Anesthesia Plan  ASA: II  Anesthesia Plan: Epidural   Post-op Pain Management:    Induction:   Airway Management Planned:   Additional Equipment:   Intra-op Plan:   Post-operative Plan:   Informed Consent: I have reviewed the patients History and Physical, chart, labs and discussed the procedure including the risks, benefits and alternatives for the proposed anesthesia with the patient or authorized representative who has indicated his/her understanding and acceptance.     Plan Discussed with:   Anesthesia Plan Comments:         Anesthesia Quick Evaluation  

## 2016-12-07 NOTE — Progress Notes (Signed)
ANTIBIOTIC CONSULT NOTE - INITIAL  Pharmacy Consult for Gentamicin Indication: Chorioamnionitis   No Known Allergies  Patient Measurements: Height: 5\' 4"  (162.6 cm) Weight: 186 lb (84.4 kg) IBW/kg (Calculated) : 54.7 Adjusted Body Weight: 63.6  Vital Signs: Temp: 100.6 F (38.1 C) (01/11 1300) Temp Source: Axillary (01/11 1300) BP: 116/67 (01/11 1300) Pulse Rate: 101 (01/11 1300)  Labs:  Recent Labs  12/07/16 0420  WBC 14.1*  HGB 10.4*  PLT 440*   No results for input(s): GENTTROUGH, GENTPEAK, GENTRANDOM in the last 72 hours.   Microbiology: Recent Results (from the past 720 hour(s))  OB RESULT CONSOLE Group B Strep     Status: None   Collection Time: 11/14/16 12:00 AM  Result Value Ref Range Status   GBS Positive  Final  Culture, beta strep (group b only)     Status: None   Collection Time: 11/14/16 11:15 AM  Result Value Ref Range Status   Organism ID, Bacteria GROUP B STREP ([email or web address]) ISOLATED  Final    Comment: Beta hemolytic streptococci are predictably susceptible to penicillin and other beta-lactams. Susceptibility testing not routinely performed.   Wet prep, genital     Status: Abnormal   Collection Time: 11/25/16  5:11 PM  Result Value Ref Range Status   Yeast Wet Prep HPF POC NONE SEEN NONE SEEN Final   Trich, Wet Prep NONE SEEN NONE SEEN Final   Clue Cells Wet Prep HPF POC NONE SEEN NONE SEEN Final   WBC, Wet Prep HPF POC MODERATE (A) NONE SEEN Final    Comment: BACTERIA- TOO NUMEROUS TO COUNT   Sperm NONE SEEN  Final    Medications:  Ampicillin 2g IV q6h  Assessment: 26 y.o. female G3P1011 at 7575w3d  Estimated Ke = 0.371, Vd = 25.4  Goal of Therapy:  Gentamicin peak 6-8 mg/L and Trough < 1 mg/L  Plan:   Gentamicin 160 mg IV every 8 hrs  Check Scr with next labs if gentamicin continued. Will check gentamicin levels if continued > 72hr or clinically indicated.  Drusilla KannerGrimsley, Luticia Tadros Lydia 12/07/2016,1:34 PM

## 2016-12-07 NOTE — Progress Notes (Signed)
Pt assisted up to bathroom to void, gait steady. Peri care taught. Pt voided without difficulty. Pt ambulated to bed. Pt tolerated ambulation well.

## 2016-12-07 NOTE — Anesthesia Procedure Notes (Signed)
Epidural Patient location during procedure: OB  Staffing Anesthesiologist: Marda Breidenbach Performed: anesthesiologist   Preanesthetic Checklist Completed: patient identified, site marked, surgical consent, pre-op evaluation, timeout performed, IV checked, risks and benefits discussed and monitors and equipment checked  Epidural Patient position: sitting Prep: DuraPrep Patient monitoring: heart rate, continuous pulse ox and blood pressure Approach: right paramedian Location: L3-L4 Injection technique: LOR saline  Needle:  Needle type: Tuohy  Needle gauge: 17 G Needle length: 9 cm and 9 Needle insertion depth: 5 cm Catheter type: closed end flexible Catheter size: 20 Guage Catheter at skin depth: 10 cm Test dose: negative  Assessment Events: blood not aspirated, injection not painful, no injection resistance, negative IV test and no paresthesia  Additional Notes Patient identified. Risks/Benefits/Options discussed with patient including but not limited to bleeding, infection, nerve damage, paralysis, failed block, incomplete pain control, headache, blood pressure changes, nausea, vomiting, reactions to medication both or allergic, itching and postpartum back pain. Confirmed with bedside nurse the patient's most recent platelet count. Confirmed with patient that they are not currently taking any anticoagulation, have any bleeding history or any family history of bleeding disorders. Patient expressed understanding and wished to proceed. All questions were answered. Sterile technique was used throughout the entire procedure. Please see nursing notes for vital signs. Test dose was given through epidural needle and negative prior to continuing to dose epidural or start infusion. Warning signs of high block given to the patient including shortness of breath, tingling/numbness in hands, complete motor block, or any concerning symptoms with instructions to call for help. Patient was given  instructions on fall risk and not to get out of bed. All questions and concerns addressed with instructions to call with any issues.     

## 2016-12-07 NOTE — Progress Notes (Signed)
OB Interim Progress Note  S: Pain well controlled. No concerns.   O: BP (!) 98/53   Pulse 86   Temp 100.3 F (37.9 C) (Axillary)   Resp 20   Ht 5\' 4"  (1.626 m)   Wt 84.4 kg (186 lb)   LMP 02/09/2016 (Approximate)   SpO2 100%   BMI 31.93 kg/m    Dilation: 8 Effacement (%): 60 (cervical swelling) Cervical Position: Posterior Station: -1 Presentation: Vertex Exam by:: J.Cox, RN  Fetal monitoring: FHR 150s, moderate variability, pos acels, no decels, regular contractions q462min   A/P: Noted to be spiking fever >100.2 x2 and greater than 30min apart with fetal tachycardia >160 for 10min.  Concern for Triple I and started on amp/gentamycin. Last cervical check 8cm with 60% with cervical swelling and -1 station.  Will continue with pit, currently at 8/12.  Continue expectant management Anticipate SVD  [name], MD 12/07/2016, 2:43 PM PGY-1

## 2016-12-08 LAB — CBC
HCT: 29.6 % — ABNORMAL LOW (ref 36.0–46.0)
Hemoglobin: 10 g/dL — ABNORMAL LOW (ref 12.0–15.0)
MCH: 28.9 pg (ref 26.0–34.0)
MCHC: 33.8 g/dL (ref 30.0–36.0)
MCV: 85.5 fL (ref 78.0–100.0)
PLATELETS: 413 10*3/uL — AB (ref 150–400)
RBC: 3.46 MIL/uL — ABNORMAL LOW (ref 3.87–5.11)
RDW: 15 % (ref 11.5–15.5)
WBC: 26.7 10*3/uL — AB (ref 4.0–10.5)

## 2016-12-08 MED ORDER — OXYCODONE-ACETAMINOPHEN 5-325 MG PO TABS
1.0000 | ORAL_TABLET | Freq: Once | ORAL | Status: AC
  Administered 2016-12-08: 1 via ORAL
  Filled 2016-12-08: qty 1

## 2016-12-08 NOTE — Anesthesia Postprocedure Evaluation (Signed)
Anesthesia Post Note  Patient: Jody HeadlandKyata Bensch  Procedure(s) Performed: * No procedures listed *  Patient location during evaluation: Mother Baby Anesthesia Type: Epidural Level of consciousness: awake and alert Pain management: pain level controlled Respiratory status: spontaneous breathing Cardiovascular status: blood pressure returned to baseline Postop Assessment: no headache, patient able to bend at knees, no backache, no signs of nausea or vomiting, epidural receding and adequate PO intake Anesthetic complications: no        Last Vitals:  Vitals:   12/08/16 0310 12/08/16 0756  BP: 118/62 (!) 116/57  Pulse: 66 77  Resp: 18 18  Temp: 36.8 C 37 C    Last Pain:  Vitals:   12/08/16 0756  TempSrc: Oral  PainSc: 3    Pain Goal: Patients Stated Pain Goal: 1 (12/08/16 0310)               Salome ArntSterling, Shanedra Lave Marie

## 2016-12-08 NOTE — Progress Notes (Signed)
Post Partum Day 1 Subjective: no complaints, up ad lib, voiding and tolerating PO  Objective: Blood pressure (!) 116/57, pulse 77, temperature 98.6 F (37 C), temperature source Oral, resp. rate 18, height 5\' 4"  (1.626 m), weight 186 lb (84.4 kg), last menstrual period 02/09/2016, SpO2 100 %, unknown if currently breastfeeding.  Physical Exam:  General: alert, cooperative and no distress Lochia: appropriate Uterine Fundus: firm Incision: healing well DVT Evaluation: No evidence of DVT seen on physical exam.   Recent Labs  12/07/16 0420 12/08/16 0519  HGB 10.4* 10.0*  HCT 32.0* 29.6*    Assessment/Plan: Plan for discharge tomorrow and Breastfeeding   LOS: 1 day   Jody Perez 12/08/2016, 4:11 PM

## 2016-12-08 NOTE — Progress Notes (Signed)
CM / UR chart review completed.  

## 2016-12-08 NOTE — Lactation Note (Signed)
This note was copied from a baby's chart. Lactation Consultation Note New mom states baby hasn't been interested in BF, been sleepy. Hand expression taught w/colostrum noted. Taught spoon feeding when baby is sleepy and doesn't want to latch. Mom excited about colostrum. Took baby a little bit to suckle on gloved finger and take 1 ml colostrum. Had to suction baby's mouth from thick mucous held in mouth. After suckling on gloved finger well assisted in football position, latched well to everted nipple. FOB enjoying massaging mom's breast. (then acted like he was growsed out when [facility] colostrum). Educated on the richness of the colostrum and how good it was for baby. Mom asked about pacifier, educated.  Baby latched on well, popped off a few times, then re-latched w/assistance. Taught mom how to [facility] for wrong latch. Taught mom and FOB chin tug.   Mom had lots of questions. Mom hopes to BF for 1 yr.  Educated on Newborn behavior, Mom encouraged to feed baby 8-12 times/24 hours and with feeding cues. Encouraged comfort during BF so colostrum flows better and mom will enjoy the feeding longer. Taking deep breaths and breast massage during BF. WH/LC brochure given w/resources, support groups and LC services. Patient Name: Jody Perez Reason for consult: Initial assessment   Maternal Data Has patient been taught Hand Expression?: Yes Does the patient have breastfeeding experience prior to this delivery?: No  Feeding Feeding Type: Breast Fed Length of feed: 15 min (still BF)  LATCH Score/Interventions Latch: Repeated attempts needed to sustain latch, nipple held in mouth throughout feeding, stimulation needed to elicit sucking reflex. Intervention(s): Assist with latch;Adjust position;Breast massage;Breast compression  Audible Swallowing: A few with stimulation Intervention(s): Hand expression;Skin to skin;Alternate breast massage  Type of Nipple:  Everted at rest and after stimulation  Comfort (Breast/Nipple): Soft / non-tender     Hold (Positioning): Assistance needed to correctly position infant at breast and maintain latch. Intervention(s): Breastfeeding basics reviewed;Support Pillows;Position options;Skin to skin  LATCH Score: 7  Lactation Tools Discussed/Used WIC Program: Yes   Consult Status Consult Status: Follow-up Date: 12/08/16 (in pm) Follow-up type: In-patient    [name], Diamond NickelLAURA G Perez, 1:20 AM

## 2016-12-08 NOTE — Progress Notes (Signed)
Post Partum Day 1 Subjective: No acute events overnight, patient reports pain is well managed with motrin. Lochia is appropriate. Patient reports minimal ambulation, but has been up to the bathroom. Voiding well, no bowel movement.  Objective: Blood pressure 118/62, pulse 66, temperature 98.3 F (36.8 C), temperature source Oral, resp. rate 18, height 5\' 4"  (1.626 m), weight 84.4 kg (186 lb), last menstrual period 02/09/2016, SpO2 100 %, unknown if currently breastfeeding.  Physical Exam:  General: alert and no distress Lochia: appropriate Uterine Fundus: firm Incision: non3 DVT Evaluation: No evidence of DVT seen on physical exam. No cords or calf tenderness. No significant calf/ankle edema.   Recent Labs  12/07/16 0420 12/08/16 0519  HGB 10.4* 10.0*  HCT 32.0* 29.6*    Assessment/Plan: Jody Perez is a 26 y.o. female, [id number] who is PP day 1  Plan for discharge tomorrow Breastfeeding Contraception with Depo or Nexplanon   LOS: 1 day   Dartha LodgeKelsey N Ford 12/08/2016, 7:36 AM

## 2016-12-09 MED ORDER — IBUPROFEN 600 MG PO TABS: 600.0000 mg | ORAL_TABLET | Freq: Four times a day (QID) | ORAL | 0 refills | Status: DC | PRN

## 2016-12-09 NOTE — Discharge Instructions (Signed)

## 2016-12-09 NOTE — Discharge Summary (Signed)
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 yo [id number] at 5441w3d was admitted in Latent Labor on 12/07/2016. Patient had a labor course complicated by Triple I for which she rec'd Amp and [place]. Membrane Rupture Time/Date: 11:35 AM ,12/07/2016   Intrapartum Procedures: Episiotomy: None [1]                                         Lacerations:  None [1]  Patient had a delivery of a Viable infant. 12/07/2016  Information for the patient's newborn:  Barrington EllisonStrong, Girl Melida GimenezKyata [[id number]][[id number]]  Delivery Method: Vag-Spont    Pateint had an uncomplicated postpartum course. She remained afebrile.  She is ambulating, tolerating a regular diet, passing flatus, and urinating well. Patient is discharged home in stable condition on 12/09/16.    Physical exam Vitals:   12/08/16 0310 12/08/16 0756 12/08/16 1851 12/09/16 0619  BP: 118/62 (!) 116/57 116/61 127/75  Pulse: 66 77 (!) 106 72  Resp: 18 18 17 18   Temp: 98.3 F (36.8 C) 98.6 F (37 C) 98.1 F (36.7 C) 98.2 F (36.8 C)  TempSrc: Oral Oral Oral Oral  SpO2:      Weight:      Height:       General: alert and cooperative Lochia: appropriate Uterine Fundus: firm Incision: N/A DVT Evaluation: No evidence of DVT seen on physical exam. Labs: Lab Results   Component Value Date   WBC 26.7 (H) 12/08/2016   HGB 10.0 (L) 12/08/2016   HCT 29.6 (L) 12/08/2016   MCV 85.5 12/08/2016   PLT 413 (H) 12/08/2016   CMP Latest Ref Rng & Units 10/15/2016  Glucose 65 - 99 mg/dL 92  BUN 6 - 20 mg/dL 6  Creatinine 1.300.44 - 8.651.00 mg/dL 7.840.72  Sodium 696135 - 295145 mmol/L 135  Potassium 3.5 - 5.1 mmol/L 3.2(L)  Chloride 101 - 111 mmol/L 106  CO2 22 - 32 mmol/L 21(L)  Calcium 8.9 - 10.3 mg/dL 2.8(U8.5(L)  Total Protein 6.5 - 8.1 g/dL 7.4  Total Bilirubin 0.3 - 1.2 mg/dL 0.4  Alkaline Phos 38 - 126 U/L 69  AST 15 - 41 U/L 20  ALT 14 - 54 U/L 9(L)    Discharge instruction: per After Visit Summary and "Baby and Me Booklet".  After  visit meds:  Allergies as of 12/09/2016   No Known Allergies     Medication List    STOP taking these medications   valACYclovir 1000 MG tablet Commonly known as:  VALTREX     TAKE these medications   ibuprofen 600 MG tablet Commonly known as:  ADVIL,MOTRIN Take 1 tablet (600 mg total) by mouth every 6 (six) hours as needed.   prenatal multivitamin Tabs tablet Take 1 tablet by mouth daily at 12 noon.       Diet: routine diet  Activity: Advance as tolerated. Pelvic rest for 6 weeks.   Outpatient follow up:6 weeks Follow up Appt:Future Appointments Date Time Provider Department Center  01/16/2017 10:45 AM Tereso Newcomer, MD [facility]   Follow up Visit:No Follow-up on file.  Postpartum contraception: Depo Provera and Nexplanon (not sure)  Newborn Data: Live born female  Birth Weight: 7 lb 11.6 oz (3505 g) APGAR: 6, 8  Baby Feeding: Breast Disposition:home with mother   12/09/2016 Cam Hai, CNM  9:35 AM

## 2016-12-09 NOTE — Lactation Note (Signed)
This note was copied from a baby's chart. Lactation Consultation Note  Comfort gels were given to mother via RN related to broken blister on the right nipple. When IBCLC entered for consult mother reported that she had fixed her problem and the solution was to formula feed. IBCLC discussed her decision with her and offer the idea of using a nipple shield or pumping and bottle feeding. FOB stated they could obtain a pump but that "she does not want to BF" explained they needed to make the best decision for their family. Mother to call if additional help is desired.   Patient Name: Jody Perez UJWJX'BToday's Date: 12/09/2016 Reason for consult: Follow-up assessment   Maternal Data    Feeding Feeding Type: Bottle Fed - Formula Length of feed: 40 min  LATCH Score/Interventions Latch: Grasps breast easily, tongue down, lips flanged, rhythmical sucking. Intervention(s): Assist with latch  Audible Swallowing: Spontaneous and intermittent Intervention(s): Skin to skin  Type of Nipple: Everted at rest and after stimulation  Comfort (Breast/Nipple): Filling, red/small blisters or bruises, mild/mod discomfort  Problem noted: Mild/Moderate discomfort Interventions (Mild/moderate discomfort):  (coconut oil)  Hold (Positioning): Assistance needed to correctly position infant at breast and maintain latch.  LATCH Score: 8  Lactation Tools Discussed/Used     Consult Status Consult Status: Complete    Soyla DryerJoseph, Chiquita Heckert 12/09/2016, 12:19 PM

## 2016-12-11 ENCOUNTER — Inpatient Hospital Stay (HOSPITAL_COMMUNITY): Admission: RE | Admit: 2016-12-11 | Payer: Medicaid Other | Source: Ambulatory Visit

## 2017-01-16 ENCOUNTER — Ambulatory Visit: Payer: Medicaid Other | Admitting: Obstetrics & Gynecology

## 2017-03-14 ENCOUNTER — Encounter: Payer: Self-pay | Admitting: *Deleted

## 2017-03-14 ENCOUNTER — Ambulatory Visit
Admission: EM | Admit: 2017-03-14 | Discharge: 2017-03-14 | Disposition: A | Discharge status: Home / Self Care | Payer: Medicaid Other | Attending: Family Medicine | Admitting: Family Medicine

## 2017-03-14 DIAGNOSIS — Z3201 Encounter for pregnancy test, result positive: Secondary | ICD-10-CM

## 2017-03-14 DIAGNOSIS — N760 Acute vaginitis: Secondary | ICD-10-CM | POA: Insufficient documentation

## 2017-03-14 DIAGNOSIS — R300 Dysuria: Secondary | ICD-10-CM

## 2017-03-14 DIAGNOSIS — Z331 Pregnant state, incidental: Secondary | ICD-10-CM | POA: Diagnosis not present

## 2017-03-14 DIAGNOSIS — B9689 Other specified bacterial agents as the cause of diseases classified elsewhere: Secondary | ICD-10-CM

## 2017-03-14 DIAGNOSIS — Z3490 Encounter for supervision of normal pregnancy, unspecified, unspecified trimester: Secondary | ICD-10-CM

## 2017-03-14 LAB — WET PREP, GENITAL
Sperm: NONE SEEN
TRICH WET PREP: NONE SEEN
YEAST WET PREP: NONE SEEN

## 2017-03-14 LAB — URINALYSIS, COMPLETE (UACMP) WITH MICROSCOPIC
Bilirubin Urine: NEGATIVE
GLUCOSE, UA: NEGATIVE mg/dL
KETONES UR: NEGATIVE mg/dL
NITRITE: NEGATIVE
Protein, ur: NEGATIVE mg/dL
Specific Gravity, Urine: 1.02 (ref 1.005–1.030)
pH: 6.5 (ref 5.0–8.0)

## 2017-03-14 LAB — CHLAMYDIA/NGC RT PCR (ARMC ONLY)
Chlamydia Tr: NOT DETECTED
N gonorrhoeae: NOT DETECTED

## 2017-03-14 LAB — PREGNANCY, URINE: Preg Test, Ur: POSITIVE — AB

## 2017-03-14 MED ORDER — METRONIDAZOLE 500 MG PO TABS: 500.0000 mg | ORAL_TABLET | Freq: Two times a day (BID) | ORAL | 0 refills | Status: DC

## 2017-03-14 NOTE — ED Triage Notes (Signed)
Dysuria, and vaginal discharge x3 days.

## 2017-03-14 NOTE — ED Provider Notes (Signed)
[facility] ____________________________________________  Time seen: Approximately 11:27 AM  I have reviewed the triage vital signs and the nursing notes.   HISTORY  Chief Complaint Dysuria and Vaginal Discharge  HPI Jody Perez is a 26 y.o. female presenting for evaluation of vaginal discharge for the last 3 days that is whitish in color with mild associated burning with urination. Denies any urinary frequency or urinary urgency. Denies vaginal pain or pelvic pain. Patient reports sexually active with one partner with last sexual intercourse about one week ago. Denies concerns of STDs. Patient reports she does not use condoms or other birth control methods. Denies any abnormal vaginal bleeding or recent vaginal bleeding. Reports last menstrual was mid-March and was described as normal. Patient reports that she is 3 months post partum vaginal delivery that was uncomplicated. Patient states that she did not have a follow-up or 6 week appointment after vaginal delivery. Patient reports that she has had similar vaginal complaints with previous bacterial vaginosis infection.  Denies chest pain, shortness of breath, abdominal pain, extremity pain, extremity swelling or rash. Denies recent sickness. Denies recent antibiotic use.   OB/GYN : Adline Peals  Patient's last menstrual period was 02/15/2017 (exact date).   Past Medical History:  Diagnosis Date  . BV (bacterial vaginosis)   . Chlamydia   . Herpes genitalis in women   . HSV infection   . Miscarriage   . Trichomonas   . UTI (lower urinary tract infection)     Patient Active Problem List   Diagnosis Date Noted  . Labor and delivery, indication for care 12/07/2016  . Group B Streptococcus carrier, +RV culture, currently pregnant 11/17/2016  . Supervision of normal pregnancy 05/03/2016  . History of genital HSV 01/28/2013    Past Surgical History:  Procedure Laterality Date  . NO PAST SURGERIES       No  current facility-administered medications for this encounter.   Current Outpatient Prescriptions:  .  ibuprofen (ADVIL,MOTRIN) 600 MG tablet, Take 1 tablet (600 mg total) by mouth every 6 (six) hours as needed., Disp: 30 tablet, Rfl: 0 .  metroNIDAZOLE (FLAGYL) 500 MG tablet, Take 1 tablet (500 mg total) by mouth 2 (two) times daily., Disp: 14 tablet, Rfl: 0 .  Prenatal Vit-Fe Fumarate-FA (PRENATAL MULTIVITAMIN) TABS tablet, Take 1 tablet by mouth daily at 12 noon., Disp: , Rfl:   Allergies Patient has no known allergies.  Family History  Problem Relation Age of Onset  . Hypertension Mother   . Hypertension Brother     Social History Social History  Substance Use Topics  . Smoking status: Never Smoker  . Smokeless tobacco: Never Used  . Alcohol use No     Comment: socially    Review of Systems Constitutional: No fever/chills ENT: No sore throat. Cardiovascular: Denies chest pain. Respiratory: Denies shortness of breath. Gastrointestinal: No abdominal pain.  No nausea, no vomiting.  No diarrhea.  No constipation. Genitourinary: as above.  Musculoskeletal: Negative for back pain. Skin: Negative for rash.  ____________________________________________   PHYSICAL EXAM:  VITAL SIGNS: ED Triage Vitals  Enc Vitals Group     BP 03/14/17 1027 129/73     Pulse Rate 03/14/17 1027 80     Resp 03/14/17 1027 16     Temp 03/14/17 1027 98.8 F (37.1 C)     Temp Source 03/14/17 1027 Oral     SpO2 03/14/17 1027 100 %     Weight 03/14/17 1028 162 lb (73.5 kg)  Height 03/14/17 1028  (1.626 m)     Head Circumference --      Peak Flow --      Pain Score --      Pain Loc --      Pain Edu? --      Excl. in GC? --     Constitutional: Alert and oriented. Well appearing and in no acute distress. ENT      Head: Normocephalic and atraumatic. Cardiovascular: Normal rate, regular rhythm. Grossly normal heart sounds.  Good peripheral circulation. Respiratory: Normal respiratory  effort without tachypnea nor retractions. Breath sounds are clear and equal bilaterally. No wheezes, rales, rhonchi. Gastrointestinal: Soft and nontender. No distention. Normal Bowel sounds. No CVA tenderness. Musculoskeletal:   No midline cervical, thoracic or lumbar tenderness to palpation.  Pelvic: Pelvic exam completed with Melissa CMA at bedside as chaperone. External: Normal appearance, no rash or lesion. Speculum: Mild to moderate amount of whitish greenish vaginal discharge, cervical os closed, no bleeding, no signs of recent bleeding. Bimanual: Nontender, no cervical or adnexal tenderness. Neurologic:  Normal speech and language. Speech is normal. No gait instability.  Skin:  Skin is warm, dry and intact. No rash noted. Psychiatric: Mood and affect are normal. Speech and behavior are normal. Patient exhibits appropriate insight and judgment   ___________________________________________   LABS (all labs ordered are listed, but only abnormal results are displayed)  Labs Reviewed  WET PREP, GENITAL - Abnormal; Notable for the following:       Result Value   Clue Cells Wet Prep HPF POC PRESENT (*)    WBC, Wet Prep HPF POC MODERATE (*)    All other components within normal limits  URINALYSIS, COMPLETE (UACMP) WITH MICROSCOPIC - Abnormal; Notable for the following:    Hgb urine dipstick TRACE (*)    Leukocytes, UA SMALL (*)    Squamous Epithelial / LPF 0-5 (*)    Bacteria, UA FEW (*)    All other components within normal limits  PREGNANCY, URINE - Abnormal; Notable for the following:    Preg Test, Ur POSITIVE (*)    All other components within normal limits  CHLAMYDIA/NGC RT PCR ([facility] ONLY)  URINE CULTURE   ____________________________________________  RADIOLOGY  No results found. ____________________________________________   PROCEDURES Procedures    INITIAL IMPRESSION / ASSESSMENT AND PLAN / ED COURSE  Pertinent labs & imaging results that were available  during my care of the patient were reviewed by me and considered in my medical decision making (see chart for details).  Well-appearing patient. No acute distress. Discussed the patient urinalysis reviewed and not clear UTI. Pelvic exam completed. Clue cells positive, bacterial vaginosis. Patient urine pregnancy positive. Discussed in detail with patient regarding this. Patient denies any abnormal or recent vaginal bleeding. Denies any abdominal pain, pelvic pain or back pain. Counseled regarding taking prenatal vitamins, no alcohol or drug use and following up closely with her OB/GYN. Also counseled in regards to bacterial vaginosis in early pregnancy and discussed with benefits of medication taken and will treat patient with oral Flagyl. Discussed indication, risks and benefits of medications with patient.  Discussed follow up with Primary care physician or OBGYN this week. Discussed follow up and return parameters including no resolution or any worsening concerns. Patient verbalized understanding and agreed to plan.    ____________________________________________   FINAL CLINICAL IMPRESSION(S) / ED DIAGNOSES  Final diagnoses:  Bacterial vaginosis  Pregnancy, unspecified gestational age     Discharge Medication List as of  03/14/2017 12:35 PM    START taking these medications   Details  metroNIDAZOLE (FLAGYL) 500 MG tablet Take 1 tablet (500 mg total) by mouth 2 (two) times daily., Starting Wed 03/14/2017, Normal        Note: This dictation was prepared with Dragon dictation along with smaller phrase technology. Any transcriptional errors that result from this process are unintentional.         Renford Dills, NP 03/14/17 1411

## 2017-03-14 NOTE — Discharge Instructions (Signed)
Take medication as prescribed. Rest. Drink plenty of fluids. Take prenatal vitamins.   Follow up with your OBGYN this week.  Follow up with your primary care physician this week as needed. Return to Urgent care for new or worsening concerns.

## 2017-03-17 ENCOUNTER — Telehealth: Payer: Self-pay

## 2017-03-17 LAB — URINE CULTURE

## 2017-03-18 ENCOUNTER — Telehealth: Payer: Self-pay | Admitting: *Deleted

## 2017-03-18 NOTE — Telephone Encounter (Signed)
Patient returned phone call. DOB verified, communicated positive for bacteria urine culture and that a prescription for keflex has been called in to her pharmacy on record. Patient reported that she was called 4/51/18 about results and has already picked up the keflex prescription. No note was found in chart recording the phone call placed from MUC to the patient dated 03/17/17.

## 2017-04-17 ENCOUNTER — Inpatient Hospital Stay (HOSPITAL_COMMUNITY)
Admission: AD | Admit: 2017-04-17 | Discharge: 2017-04-17 | Disposition: A | Discharge status: Home / Self Care | Payer: Medicaid Other | Source: Ambulatory Visit | Attending: Family Medicine | Admitting: Family Medicine

## 2017-04-17 ENCOUNTER — Encounter (HOSPITAL_COMMUNITY): Payer: Self-pay

## 2017-04-17 DIAGNOSIS — Z8249 Family history of ischemic heart disease and other diseases of the circulatory system: Secondary | ICD-10-CM | POA: Insufficient documentation

## 2017-04-17 DIAGNOSIS — Z3A08 8 weeks gestation of pregnancy: Secondary | ICD-10-CM | POA: Insufficient documentation

## 2017-04-17 DIAGNOSIS — Z79899 Other long term (current) drug therapy: Secondary | ICD-10-CM | POA: Insufficient documentation

## 2017-04-17 DIAGNOSIS — O26891 Other specified pregnancy related conditions, first trimester: Secondary | ICD-10-CM

## 2017-04-17 DIAGNOSIS — N898 Other specified noninflammatory disorders of vagina: Secondary | ICD-10-CM | POA: Diagnosis not present

## 2017-04-17 LAB — URINALYSIS, ROUTINE W REFLEX MICROSCOPIC
Bilirubin Urine: NEGATIVE
Glucose, UA: NEGATIVE mg/dL
Hgb urine dipstick: NEGATIVE
Ketones, ur: NEGATIVE mg/dL
LEUKOCYTES UA: NEGATIVE
NITRITE: NEGATIVE
PH: 7 (ref 5.0–8.0)
PROTEIN: NEGATIVE mg/dL
SPECIFIC GRAVITY, URINE: 1.019 (ref 1.005–1.030)

## 2017-04-17 LAB — POCT PREGNANCY, URINE: Preg Test, Ur: POSITIVE — AB

## 2017-04-17 LAB — WET PREP, GENITAL
CLUE CELLS WET PREP: NONE SEEN
Sperm: NONE SEEN
Trich, Wet Prep: NONE SEEN
YEAST WET PREP: NONE SEEN

## 2017-04-17 NOTE — MAU Note (Signed)
Patient presents with c/o discharge that started 2 days ago. Patient states that it is whit and has an odor to it. Patient states that it is also irritated.

## 2017-04-17 NOTE — MAU Provider Note (Signed)
History     CSN: [id number]  Arrival date and time: 04/17/17 1450   First Provider Initiated Contact with Patient 04/17/17 1555      Chief Complaint  Patient presents with  . Vaginal Discharge   [id number] @[redacted]w[redacted]d  here with vaginal discharge and irritation. Sx started 2 days ago. Discharge is white and both thick and thin with malodor. No bleeding or pain. No new partner. Remote hx of STIs. She reports a recent change in soap prior to onset of sx.    OB History    Gravida Para Term Preterm AB Living   4 2 2   1 2    SAB TAB Ectopic Multiple Live Births   1     0 2      Past Medical History:  Diagnosis Date  . BV (bacterial vaginosis)   . Chlamydia   . Herpes genitalis in women   . HSV infection   . Miscarriage   . Trichomonas   . UTI (lower urinary tract infection)     Past Surgical History:  Procedure Laterality Date  . NO PAST SURGERIES      Family History  Problem Relation Age of Onset  . Hypertension Mother   . Hypertension Brother     Social History  Substance Use Topics  . Smoking status: Never Smoker  . Smokeless tobacco: Never Used  . Alcohol use No     Comment: socially    Allergies: No Known Allergies  Prescriptions Prior to Admission  Medication Sig Dispense Refill Last Dose  . ibuprofen (ADVIL,MOTRIN) 600 MG tablet Take 1 tablet (600 mg total) by mouth every 6 (six) hours as needed. 30 tablet 0   . metroNIDAZOLE (FLAGYL) 500 MG tablet Take 1 tablet (500 mg total) by mouth 2 (two) times daily. 14 tablet 0   . Prenatal Vit-Fe Fumarate-FA (PRENATAL MULTIVITAMIN) TABS tablet Take 1 tablet by mouth daily at 12 noon.   12/06/2016 at Unknown time    Review of Systems  Gastrointestinal: Negative for abdominal pain.  Genitourinary: Positive for vaginal discharge. Negative for vaginal bleeding.   Physical Exam   Blood pressure 126/77, pulse 83, temperature 98.4 F (36.9 C), temperature source Oral, resp. rate 18, last menstrual period 02/15/2017,  SpO2 99 %, unknown if currently breastfeeding.  Physical Exam  Constitutional: She is oriented to person, place, and time. She appears well-developed and well-nourished. No distress.  HENT:  Head: Normocephalic and atraumatic.  Neck: Normal range of motion.  Cardiovascular: Normal rate.   Respiratory: Effort normal.  Genitourinary:  Genitourinary Comments: External: no lesions or erythema Vagina: rugated, parous, moderate amt thick yellow discharge from os    Musculoskeletal: Normal range of motion.  Neurological: She is alert and oriented to person, place, and time.  Skin: Skin is warm and dry.  Psychiatric: She has a normal mood and affect.   Results for orders placed or performed during the hospital encounter of 04/17/17 (from the past 24 hour(s))  Urinalysis, Routine w reflex microscopic     Status: None   Collection Time: 04/17/17  3:20 PM  Result Value Ref Range   Color, Urine YELLOW YELLOW   APPearance CLEAR CLEAR   Specific Gravity, Urine 1.019 1.005 - 1.030   pH 7.0 5.0 - 8.0   Glucose, UA NEGATIVE NEGATIVE mg/dL   Hgb urine dipstick NEGATIVE NEGATIVE   Bilirubin Urine NEGATIVE NEGATIVE   Ketones, ur NEGATIVE NEGATIVE mg/dL   Protein, ur NEGATIVE NEGATIVE mg/dL   Nitrite NEGATIVE  NEGATIVE   Leukocytes, UA NEGATIVE NEGATIVE  Pregnancy, urine POC     Status: Abnormal   Collection Time: 04/17/17  3:45 PM  Result Value Ref Range   Preg Test, Ur POSITIVE (A) NEGATIVE  Wet prep, genital     Status: Abnormal   Collection Time: 04/17/17  4:00 PM  Result Value Ref Range   Yeast Wet Prep HPF POC NONE SEEN NONE SEEN   Trich, Wet Prep NONE SEEN NONE SEEN   Clue Cells Wet Prep HPF POC NONE SEEN NONE SEEN   WBC, Wet Prep HPF POC MANY (A) NONE SEEN   Sperm NONE SEEN    MAU Course  Procedures  MDM Labs ordered and reviewed. GC/CT pending. No evidence of BV or YV. Stable for discharge home.  Assessment and Plan   1. [redacted] weeks gestation of pregnancy   2. Vaginal discharge  during pregnancy in first trimester    [facility] Pregnancy verification letter provided Start PNV 1 po daily, may by OTC  Allergies as of 04/17/2017   No Known Allergies     Medication List    STOP taking these medications   ibuprofen 600 MG tablet Commonly known as:  ADVIL,MOTRIN   metroNIDAZOLE 500 MG tablet Commonly known as:  Lezlie LyeFLAGYL      Jody Perez, CNM 04/17/2017, 4:06 PM

## 2017-04-18 LAB — GC/CHLAMYDIA PROBE AMP (~~LOC~~) NOT AT ARMC
Chlamydia: NEGATIVE
Neisseria Gonorrhea: NEGATIVE

## 2017-05-07 ENCOUNTER — Encounter: Payer: Self-pay | Admitting: Family Medicine

## 2017-05-07 ENCOUNTER — Ambulatory Visit (INDEPENDENT_AMBULATORY_CARE_PROVIDER_SITE_OTHER): Payer: Medicaid Other | Admitting: Family Medicine

## 2017-05-07 VITALS — BP 105/70 | HR 82 | Wt 162.0 lb

## 2017-05-07 DIAGNOSIS — Z3481 Encounter for supervision of other normal pregnancy, first trimester: Secondary | ICD-10-CM | POA: Diagnosis not present

## 2017-05-07 DIAGNOSIS — O219 Vomiting of pregnancy, unspecified: Secondary | ICD-10-CM

## 2017-05-07 DIAGNOSIS — O09899 Supervision of other high risk pregnancies, unspecified trimester: Secondary | ICD-10-CM | POA: Insufficient documentation

## 2017-05-07 DIAGNOSIS — Z3689 Encounter for other specified antenatal screening: Secondary | ICD-10-CM | POA: Diagnosis not present

## 2017-05-07 DIAGNOSIS — Z3480 Encounter for supervision of other normal pregnancy, unspecified trimester: Secondary | ICD-10-CM | POA: Insufficient documentation

## 2017-05-07 MED ORDER — DOXYLAMINE-PYRIDOXINE ER 20-20 MG PO TBCR: 1.0000 | EXTENDED_RELEASE_TABLET | Freq: Two times a day (BID) | ORAL | 3 refills | Status: DC

## 2017-05-07 NOTE — Progress Notes (Signed)
Subjective:    Jody Perez is a [id number] being seen today for her first obstetrical visit.  Her obstetrical history is significant for short interval between pregnancy. Patient does intend to breast feed. Pregnancy history fully reviewed.  Patient reports nausea.  Vitals:   05/07/17 1509  BP: 105/70  Pulse: 82  Weight: 162 lb (73.5 kg)    HISTORY: OB History  Gravida Para Term Preterm AB Living  4 2 2  0 1 2  SAB TAB Ectopic Multiple Live Births  1 0 0 0 2    # Outcome Date GA Lbr Len/2nd Weight Sex Delivery Anes PTL Lv  4 Current           3 Term 12/07/16 275w3d 16:36 / 01:24 7 lb 11.6 oz (3.505 kg) F Vag-Spont EPI  LIV  2 Term 08/18/13 5561w3d 18:30 / 04:10 6 lb 2.1 oz (2.78 kg) F Vag-Spont EPI  LIV     Birth Comments: Delivery complications:       Spontaneous labor at 39 2/7 weeks. Intrapartum fever. Mom considered to have chorioamnionitis. Given antibiotics (ampicillin and gentamicin). SVD after prolonged pushing.   HEPATIC:    Mother's blood type is O positive, infant is A positive with positive Coombs.  The baby had a rapid initial rise in serum bilirubin for which she was treated aggressively with triple phototherapy and IV hydration.Treatment for hyperbilirubinemia included IVIG on the first day of life and phototherapy days 1-4. Serum bilirubin levels were monitored frequently, peaking at 10.6 on day 5. Last bilirubin prior to discharge was 10.6 on 08/22/13, and baby had been off phototherapy for 24 hours. Infant will return to the hospital for an outpatient bilirubin check on 08/23/13.   HEME:   Hematocrit on admission was 54.9 with reticulocyte count 8.1%. Repeat Hematocrit on 08/23/13 was 34.4. Baby is without symptoms of anemia. Her mother was counseled about risk of late anemia due to continued low-grade hemolysis. The baby received 1 dose of IVIG on DOL 1 with the intention of reducing this risk.  Infant should be followed clinically for signs of  anemia.   INFECTION:    Infection risks at delivery included maternal GBS and clinical chorioamnionitis.  The admission CBC was benign but the procalcitonin (bio-maker for infection) was elevated.  Infant received 3 days of IV antibiotics for possible sepsis by which time the procalcitonin had normalized and infant was clinically stable. Her blood culture was negative. The placenta was not sent for pathology examination.  1 SAB 2013             Birth Comments: System Generated. Please review and update pregnancy details.     Past Medical History:  Diagnosis Date  . BV (bacterial vaginosis)   . Chlamydia   . Herpes genitalis in women   . HSV infection   . Miscarriage   . Trichomonas   . UTI (lower urinary tract infection)    Past Surgical History:  Procedure Laterality Date  . NO PAST SURGERIES     Family History  Problem Relation Age of Onset  . Hypertension Mother   . Hypertension Brother     Exam    Uterus:   12 wk  Pelvic Exam:    Skin: normal coloration and turgor, no rashes    Neurologic: normal   Extremities: normal strength, tone, and muscle mass   HEENT PERRLA and extra ocular movement intact   Mouth/Teeth mucous membranes moist, pharynx normal without lesions   Neck  supple   Cardiovascular: regular rate and rhythm, no murmurs or gallops   Respiratory:  appears well, vitals normal, no respiratory distress, acyanotic, normal RR, ear and throat exam is normal, neck free of mass or lymphadenopathy, chest clear, no wheezing, crepitations, rhonchi, normal symmetric air entry   Abdomen: soft, non-tender; bowel sounds normal; no masses,  no organomegaly   Limited u/s reveals SIUP  11 wk 1 day + flicker   Assessment/Plan:   1. Supervision of other normal pregnancy, antepartum Just had a baby in January of 2018.  Normal pap 04/2016 - Obstetric Panel, Including HIV - GC/Chlamydia probe amp ([place])not at [facility] - [place] MFM Fetal Nuchal Translucency; Future - [place]  bedside; Future  2. Short interval between pregnancies affecting pregnancy, antepartum No longer nursing Baby is 3 months old  3. Nausea and vomiting during pregnancy - Doxylamine-Pyridoxine ER ([facility]) 20-20 MG TBCR; Take 1 tablet by mouth 2 (two) times daily.  Dispense: 60 tablet; Refill: 3    Jody Perez 05/07/2017

## 2017-05-07 NOTE — Progress Notes (Signed)
Desire suppression treatment throughout pregnancy.

## 2017-05-07 NOTE — Patient Instructions (Signed)
 First Trimester of Pregnancy The first trimester of pregnancy is from week 1 until the end of week 13 (months 1 through 3). A week after a sperm fertilizes an egg, the egg will implant on the wall of the uterus. This embryo will begin to develop into a baby. Genes from you and your partner will form the baby. The female genes will determine whether the baby will be a boy or a girl. At 6-8 weeks, the eyes and face will be formed, and the heartbeat can be seen on ultrasound. At the end of 12 weeks, all the baby's organs will be formed. Now that you are pregnant, you will want to do everything you can to have a healthy baby. Two of the most important things are to get good prenatal care and to follow your health care provider's instructions. Prenatal care is all the medical care you receive before the baby's birth. This care will help prevent, find, and treat any problems during the pregnancy and childbirth. Body changes during your first trimester Your body goes through many changes during pregnancy. The changes vary from woman to woman.  You may gain or lose a couple of pounds at first.  You may feel sick to your stomach (nauseous) and you may throw up (vomit). If the vomiting is uncontrollable, call your health care provider.  You may tire easily.  You may develop headaches that can be relieved by medicines. All medicines should be approved by your health care provider.  You may urinate more often. Painful urination may mean you have a bladder infection.  You may develop heartburn as a result of your pregnancy.  You may develop constipation because certain hormones are causing the muscles that push stool through your intestines to slow down.  You may develop hemorrhoids or swollen veins (varicose veins).  Your breasts may begin to grow larger and become tender. Your nipples may stick out more, and the tissue that surrounds them (areola) may become darker.  Your gums may bleed and may be  sensitive to brushing and flossing.  Dark spots or blotches (chloasma, mask of pregnancy) may develop on your face. This will likely fade after the baby is born.  Your menstrual periods will stop.  You may have a loss of appetite.  You may develop cravings for certain kinds of food.  You may have changes in your emotions from day to day, such as being excited to be pregnant or being concerned that something may go wrong with the pregnancy and baby.  You may have more vivid and strange dreams.  You may have changes in your hair. These can include thickening of your hair, rapid growth, and changes in texture. Some women also have hair loss during or after pregnancy, or hair that feels dry or thin. Your hair will most likely return to normal after your baby is born.  What to expect at prenatal visits During a routine prenatal visit:  You will be weighed to make sure you and the baby are growing normally.  Your blood pressure will be taken.  Your abdomen will be measured to track your baby's growth.  The fetal heartbeat will be listened to between weeks 10 and 14 of your pregnancy.  Test results from any previous visits will be discussed.  Your health care provider may ask you:  How you are feeling.  If you are feeling the baby move.  If you have had any abnormal symptoms, such as leaking fluid, bleeding, severe   headaches, or abdominal cramping.  If you are using any tobacco products, including cigarettes, chewing tobacco, and electronic cigarettes.  If you have any questions.  Other tests that may be performed during your first trimester include:  Blood tests to find your blood type and to check for the presence of any previous infections. The tests will also be used to check for low iron levels (anemia) and protein on red blood cells (Rh antibodies). Depending on your risk factors, or if you previously had diabetes during pregnancy, you may have tests to check for high blood  sugar that affects pregnant women (gestational diabetes).  Urine tests to check for infections, diabetes, or protein in the urine.  An ultrasound to confirm the proper growth and development of the baby.  Fetal screens for spinal cord problems (spina bifida) and Down syndrome.  HIV (human immunodeficiency virus) testing. Routine prenatal testing includes screening for HIV, unless you choose not to have this test.  You may need other tests to make sure you and the baby are doing well.  Follow these instructions at home: Medicines  Follow your health care provider's instructions regarding medicine use. Specific medicines may be either safe or unsafe to take during pregnancy.  Take a prenatal vitamin that contains at least 600 micrograms (mcg) of folic acid.  If you develop constipation, try taking a stool softener if your health care provider approves. Eating and drinking  Eat a balanced diet that includes fresh fruits and vegetables, whole grains, good sources of protein such as meat, eggs, or tofu, and low-fat dairy. Your health care provider will help you determine the amount of weight gain that is right for you.  Avoid raw meat and uncooked cheese. These carry germs that can cause birth defects in the baby.  Eating four or five small meals rather than three large meals a day may help relieve nausea and vomiting. If you start to feel nauseous, eating a few soda crackers can be helpful. Drinking liquids between meals, instead of during meals, also seems to help ease nausea and vomiting.  Limit foods that are high in fat and processed sugars, such as fried and sweet foods.  To prevent constipation: ? Eat foods that are high in fiber, such as fresh fruits and vegetables, whole grains, and beans. ? Drink enough fluid to keep your urine clear or pale yellow. Activity  Exercise only as directed by your health care provider. Most women can continue their usual exercise routine during  pregnancy. Try to exercise for 30 minutes at least 5 days a week. Exercising will help you: ? Control your weight. ? Stay in shape. ? Be prepared for labor and delivery.  Experiencing pain or cramping in the lower abdomen or lower back is a good sign that you should stop exercising. Check with your health care provider before continuing with normal exercises.  Try to avoid standing for long periods of time. Move your legs often if you must stand in one place for a long time.  Avoid heavy lifting.  Wear low-heeled shoes and practice good posture.  You may continue to have sex unless your health care provider tells you not to. Relieving pain and discomfort  Wear a good support bra to relieve breast tenderness.  Take warm sitz baths to soothe any pain or discomfort caused by hemorrhoids. Use hemorrhoid cream if your health care provider approves.  Rest with your legs elevated if you have leg cramps or low back pain.  If you   develop varicose veins in your legs, wear support hose. Elevate your feet for 15 minutes, 3-4 times a day. Limit salt in your diet. Prenatal care  Schedule your prenatal visits by the twelfth week of pregnancy. They are usually scheduled monthly at first, then more often in the last 2 months before delivery.  Write down your questions. Take them to your prenatal visits.  Keep all your prenatal visits as told by your health care provider. This is important. Safety  Wear your seat belt at all times when driving.  Make a list of emergency phone numbers, including numbers for family, friends, the hospital, and police and fire departments. General instructions  Ask your health care provider for a referral to a local prenatal education class. Begin classes no later than the beginning of month 6 of your pregnancy.  Ask for help if you have counseling or nutritional needs during pregnancy. Your health care provider can offer advice or refer you to specialists for help  with various needs.  Do not use hot tubs, steam rooms, or saunas.  Do not douche or use tampons or scented sanitary pads.  Do not cross your legs for long periods of time.  Avoid cat litter boxes and soil used by cats. These carry germs that can cause birth defects in the baby and possibly loss of the fetus by miscarriage or stillbirth.  Avoid all smoking, herbs, alcohol, and medicines not prescribed by your health care provider. Chemicals in these products affect the formation and growth of the baby.  Do not use any products that contain nicotine or tobacco, such as cigarettes and e-cigarettes. If you need help quitting, ask your health care provider. You may receive counseling support and other resources to help you quit.  Schedule a dentist appointment. At home, brush your teeth with a soft toothbrush and be gentle when you floss. Contact a health care provider if:  You have dizziness.  You have mild pelvic cramps, pelvic pressure, or nagging pain in the abdominal area.  You have persistent nausea, vomiting, or diarrhea.  You have a bad smelling vaginal discharge.  You have pain when you urinate.  You notice increased swelling in your face, hands, legs, or ankles.  You are exposed to fifth disease or chickenpox.  You are exposed to German measles (rubella) and have never had it. Get help right away if:  You have a fever.  You are leaking fluid from your vagina.  You have spotting or bleeding from your vagina.  You have severe abdominal cramping or pain.  You have rapid weight gain or loss.  You vomit blood or material that looks like coffee grounds.  You develop a severe headache.  You have shortness of breath.  You have any kind of trauma, such as from a fall or a car accident. Summary  The first trimester of pregnancy is from week 1 until the end of week 13 (months 1 through 3).  Your body goes through many changes during pregnancy. The changes vary from  woman to woman.  You will have routine prenatal visits. During those visits, your health care provider will examine you, discuss any test results you may have, and talk with you about how you are feeling. This information is not intended to replace advice given to you by your health care provider. Make sure you discuss any questions you have with your health care provider. Document Released: 11/07/2001 Document Revised: 10/25/2016 Document Reviewed: 10/25/2016 [employer] Interactive Patient Education  2017   [employer]   Breastfeeding Deciding to breastfeed is one of the best choices you can make for you and your baby. A change in hormones during pregnancy causes your breast tissue to grow and increases the number and size of your milk ducts. These hormones also allow proteins, sugars, and fats from your blood supply to make breast milk in your milk-producing glands. Hormones prevent breast milk from being released before your baby is born as well as prompt milk flow after birth. Once breastfeeding has begun, thoughts of your baby, as well as his or her sucking or crying, can stimulate the release of milk from your milk-producing glands. Benefits of breastfeeding For Your Baby  Your first milk (colostrum) helps your baby's digestive system function better.  There are antibodies in your milk that help your baby fight off infections.  Your baby has a lower incidence of asthma, allergies, and sudden infant death syndrome.  The nutrients in breast milk are better for your baby than infant formulas and are designed uniquely for your baby's needs.  Breast milk improves your baby's brain development.  Your baby is less likely to develop other conditions, such as childhood obesity, asthma, or type 2 diabetes mellitus.  For You  Breastfeeding helps to create a very special bond between you and your baby.  Breastfeeding is convenient. Breast milk is always available at the correct temperature and  costs nothing.  Breastfeeding helps to burn calories and helps you lose the weight gained during pregnancy.  Breastfeeding makes your uterus contract to its prepregnancy size faster and slows bleeding (lochia) after you give birth.  Breastfeeding helps to lower your risk of developing type 2 diabetes mellitus, osteoporosis, and breast or ovarian cancer later in life.  Signs that your baby is hungry Early Signs of Hunger  Increased alertness or activity.  Stretching.  Movement of the head from side to side.  Movement of the head and opening of the mouth when the corner of the mouth or cheek is stroked (rooting).  Increased sucking sounds, smacking lips, cooing, sighing, or squeaking.  Hand-to-mouth movements.  Increased sucking of fingers or hands.  Late Signs of Hunger  Fussing.  Intermittent crying.  Extreme Signs of Hunger Signs of extreme hunger will require calming and consoling before your baby will be able to breastfeed successfully. Do not wait for the following signs of extreme hunger to occur before you initiate breastfeeding:  Restlessness.  A loud, Sarafian cry.  Screaming.  Breastfeeding basics Breastfeeding Initiation  Find a comfortable place to sit or lie down, with your neck and back well supported.  Place a pillow or rolled up blanket under your baby to bring him or her to the level of your breast (if you are seated). Nursing pillows are specially designed to help support your arms and your baby while you breastfeed.  Make sure that your baby's abdomen is facing your abdomen.  Gently massage your breast. With your fingertips, massage from your chest wall toward your nipple in a circular motion. This encourages milk flow. You may need to continue this action during the feeding if your milk flows slowly.  Support your breast with 4 fingers underneath and your thumb above your nipple. Make sure your fingers are well away from your nipple and your baby's  mouth.  Stroke your baby's lips gently with your finger or nipple.  When your baby's mouth is open wide enough, quickly bring your baby to your breast, placing your entire nipple and as   much of the colored area around your nipple (areola) as possible into your baby's mouth. ? More areola should be visible above your baby's upper lip than below the lower lip. ? Your baby's tongue should be between his or her lower gum and your breast.  Ensure that your baby's mouth is correctly positioned around your nipple (latched). Your baby's lips should create a seal on your breast and be turned out (everted).  It is common for your baby to suck about 2-3 minutes in order to start the flow of breast milk.  Latching Teaching your baby how to latch on to your breast properly is very important. An improper latch can cause nipple pain and decreased milk supply for you and poor weight gain in your baby. Also, if your baby is not latched onto your nipple properly, he or she may swallow some air during feeding. This can make your baby fussy. Burping your baby when you switch breasts during the feeding can help to get rid of the air. However, teaching your baby to latch on properly is still the best way to prevent fussiness from swallowing air while breastfeeding. Signs that your baby has successfully latched on to your nipple:  Silent tugging or silent sucking, without causing you pain.  Swallowing heard between every 3-4 sucks.  Muscle movement above and in front of his or her ears while sucking.  Signs that your baby has not successfully latched on to nipple:  Sucking sounds or smacking sounds from your baby while breastfeeding.  Nipple pain.  If you think your baby has not latched on correctly, slip your finger into the corner of your baby's mouth to break the suction and place it between your baby's gums. Attempt breastfeeding initiation again. Signs of Successful Breastfeeding Signs from your  baby:  A gradual decrease in the number of sucks or complete cessation of sucking.  Falling asleep.  Relaxation of his or her body.  Retention of a small amount of milk in his or her mouth.  Letting go of your breast by himself or herself.  Signs from you:  Breasts that have increased in firmness, weight, and size 1-3 hours after feeding.  Breasts that are softer immediately after breastfeeding.  Increased milk volume, as well as a change in milk consistency and color by the fifth day of breastfeeding.  Nipples that are not sore, cracked, or bleeding.  Signs That Your Baby is Getting Enough Milk  Wetting at least 1-2 diapers during the first 24 hours after birth.  Wetting at least 5-6 diapers every 24 hours for the first week after birth. The urine should be clear or pale yellow by 5 days after birth.  Wetting 6-8 diapers every 24 hours as your baby continues to grow and develop.  At least 3 stools in a 24-hour period by age 5 days. The stool should be soft and yellow.  At least 3 stools in a 24-hour period by age 7 days. The stool should be seedy and yellow.  No loss of weight greater than 10% of birth weight during the first 3 days of age.  Average weight gain of 4-7 ounces (113-198 g) per week after age 4 days.  Consistent daily weight gain by age 5 days, without weight loss after the age of 2 weeks.  After a feeding, your baby may spit up a small amount. This is common. Breastfeeding frequency and duration Frequent feeding will help you make more milk and can prevent sore nipples and   breast engorgement. Breastfeed when you feel the need to reduce the fullness of your breasts or when your baby shows signs of hunger. This is called "breastfeeding on demand." Avoid introducing a pacifier to your baby while you are working to establish breastfeeding (the first 4-6 weeks after your baby is born). After this time you may choose to use a pacifier. Research has shown that  pacifier use during the first year of a baby's life decreases the risk of sudden infant death syndrome (SIDS). Allow your baby to feed on each breast as long as he or she wants. Breastfeed until your baby is finished feeding. When your baby unlatches or falls asleep while feeding from the first breast, offer the second breast. Because newborns are often sleepy in the first few weeks of life, you may need to awaken your baby to get him or her to feed. Breastfeeding times will vary from baby to baby. However, the following rules can serve as a guide to help you ensure that your baby is properly fed:  Newborns (babies 4 weeks of age or younger) may breastfeed every 1-3 hours.  Newborns should not go longer than 3 hours during the day or 5 hours during the night without breastfeeding.  You should breastfeed your baby a minimum of 8 times in a 24-hour period until you begin to introduce solid foods to your baby at around 6 months of age.  Breast milk pumping Pumping and storing breast milk allows you to ensure that your baby is exclusively fed your breast milk, even at times when you are unable to breastfeed. This is especially important if you are going back to work while you are still breastfeeding or when you are not able to be present during feedings. Your lactation consultant can give you guidelines on how long it is safe to store breast milk. A breast pump is a machine that allows you to pump milk from your breast into a sterile bottle. The pumped breast milk can then be stored in a refrigerator or freezer. Some breast pumps are operated by hand, while others use electricity. Ask your lactation consultant which type will work best for you. Breast pumps can be purchased, but some hospitals and breastfeeding support groups lease breast pumps on a monthly basis. A lactation consultant can teach you how to hand express breast milk, if you prefer not to use a pump. Caring for your breasts while you  breastfeed Nipples can become dry, cracked, and sore while breastfeeding. The following recommendations can help keep your breasts moisturized and healthy:  Avoid using soap on your nipples.  Wear a supportive bra. Although not required, special nursing bras and tank tops are designed to allow access to your breasts for breastfeeding without taking off your entire bra or top. Avoid wearing underwire-style bras or extremely tight bras.  Air dry your nipples for 3-4minutes after each feeding.  Use only cotton bra pads to absorb leaked breast milk. Leaking of breast milk between feedings is normal.  Use lanolin on your nipples after breastfeeding. Lanolin helps to maintain your skin's normal moisture barrier. If you use pure lanolin, you do not need to wash it off before feeding your baby again. Pure lanolin is not toxic to your baby. You may also hand express a few drops of breast milk and gently massage that milk into your nipples and allow the milk to air dry.  In the first few weeks after giving birth, some women experience extremely full breasts (engorgement).   Engorgement can make your breasts feel heavy, warm, and tender to the touch. Engorgement peaks within 3-5 days after you give birth. The following recommendations can help ease engorgement:  Completely empty your breasts while breastfeeding or pumping. You may want to start by applying warm, moist heat (in the shower or with warm water-soaked hand towels) just before feeding or pumping. This increases circulation and helps the milk flow. If your baby does not completely empty your breasts while breastfeeding, pump any extra milk after he or she is finished.  Wear a snug bra (nursing or regular) or tank top for 1-2 days to signal your body to slightly decrease milk production.  Apply ice packs to your breasts, unless this is too uncomfortable for you.  Make sure that your baby is latched on and positioned properly while  breastfeeding.  If engorgement persists after 48 hours of following these recommendations, contact your health care provider or a lactation consultant. Overall health care recommendations while breastfeeding  Eat healthy foods. Alternate between meals and snacks, eating 3 of each per day. Because what you eat affects your breast milk, some of the foods may make your baby more irritable than usual. Avoid eating these foods if you are sure that they are negatively affecting your baby.  Drink milk, fruit juice, and water to satisfy your thirst (about 10 glasses a day).  Rest often, relax, and continue to take your prenatal vitamins to prevent fatigue, stress, and anemia.  Continue breast self-awareness checks.  Avoid chewing and smoking tobacco. Chemicals from cigarettes that pass into breast milk and exposure to secondhand smoke may harm your baby.  Avoid alcohol and drug use, including marijuana. Some medicines that may be harmful to your baby can pass through breast milk. It is important to ask your health care provider before taking any medicine, including all over-the-counter and prescription medicine as well as vitamin and herbal supplements. It is possible to become pregnant while breastfeeding. If birth control is desired, ask your health care provider about options that will be safe for your baby. Contact a health care provider if:  You feel like you want to stop breastfeeding or have become frustrated with breastfeeding.  You have painful breasts or nipples.  Your nipples are cracked or bleeding.  Your breasts are red, tender, or warm.  You have a swollen area on either breast.  You have a fever or chills.  You have nausea or vomiting.  You have drainage other than breast milk from your nipples.  Your breasts do not become full before feedings by the fifth day after you give birth.  You feel sad and depressed.  Your baby is too sleepy to eat well.  Your baby is having  trouble sleeping.  Your baby is wetting less than 3 diapers in a 24-hour period.  Your baby has less than 3 stools in a 24-hour period.  Your baby's skin or the white part of his or her eyes becomes yellow.  Your baby is not gaining weight by 5 days of age. Get help right away if:  Your baby is overly tired (lethargic) and does not want to wake up and feed.  Your baby develops an unexplained fever. This information is not intended to replace advice given to you by your health care provider. Make sure you discuss any questions you have with your health care provider. Document Released: 11/13/2005 Document Revised: 04/26/2016 Document Reviewed: 05/07/2013 [employer] Interactive Patient Education  2017 [employer]  

## 2017-05-08 ENCOUNTER — Encounter: Payer: Self-pay | Admitting: *Deleted

## 2017-05-08 LAB — OBSTETRIC PANEL, INCLUDING HIV
Antibody Screen: NEGATIVE
BASOS ABS: 0 10*3/uL (ref 0.0–0.2)
Basos: 0 %
EOS (ABSOLUTE): 0.1 10*3/uL (ref 0.0–0.4)
Eos: 1 %
HEMATOCRIT: 37.3 % (ref 34.0–46.6)
HEP B S AG: NEGATIVE
HIV Screen 4th Generation wRfx: NONREACTIVE
Hemoglobin: 12.3 g/dL (ref 11.1–15.9)
IMMATURE GRANS (ABS): 0 10*3/uL (ref 0.0–0.1)
Immature Granulocytes: 0 %
LYMPHS: 19 %
Lymphocytes Absolute: 2 10*3/uL (ref 0.7–3.1)
MCH: 28.9 pg (ref 26.6–33.0)
MCHC: 33 g/dL (ref 31.5–35.7)
MCV: 88 fL (ref 79–97)
MONOCYTES: 4 %
Monocytes Absolute: 0.5 10*3/uL (ref 0.1–0.9)
NEUTROS ABS: 8 10*3/uL — AB (ref 1.4–7.0)
Neutrophils: 76 %
PLATELETS: 376 10*3/uL (ref 150–379)
RBC: 4.25 x10E6/uL (ref 3.77–5.28)
RDW: 16.3 % — AB (ref 12.3–15.4)
RPR: NONREACTIVE
RUBELLA: 3.4 {index} (ref >0.99)
Rh Factor: POSITIVE
WBC: 10.7 10*3/uL (ref 3.4–10.8)

## 2017-05-19 ENCOUNTER — Ambulatory Visit (HOSPITAL_COMMUNITY)
Admission: EM | Admit: 2017-05-19 | Discharge: 2017-05-19 | Disposition: A | Discharge status: Home / Self Care | Payer: Medicaid Other | Attending: Nurse Practitioner | Admitting: Nurse Practitioner

## 2017-05-19 ENCOUNTER — Encounter (HOSPITAL_COMMUNITY): Payer: Self-pay | Admitting: *Deleted

## 2017-05-19 DIAGNOSIS — Z79899 Other long term (current) drug therapy: Secondary | ICD-10-CM | POA: Diagnosis not present

## 2017-05-19 DIAGNOSIS — N898 Other specified noninflammatory disorders of vagina: Secondary | ICD-10-CM | POA: Diagnosis present

## 2017-05-19 DIAGNOSIS — N760 Acute vaginitis: Secondary | ICD-10-CM | POA: Diagnosis not present

## 2017-05-19 MED ORDER — TERCONAZOLE 0.4 % VA CREA: 1.0000 | TOPICAL_CREAM | Freq: Every day | VAGINAL | 0 refills | Status: AC

## 2017-05-19 MED ORDER — CLINDAMYCIN HCL 300 MG PO CAPS: 300.0000 mg | ORAL_CAPSULE | Freq: Two times a day (BID) | ORAL | 0 refills | Status: AC

## 2017-05-19 NOTE — ED Provider Notes (Signed)
CSN: [id number]     Arrival date & time 05/19/17  1430 History   First MD Initiated Contact with Patient 05/19/17 1531     Chief Complaint  Patient presents with  . Vaginal Discharge   (Consider location/radiation/quality/duration/timing/severity/associated sxs/prior Treatment)      Vaginal Discharge  Quality:  White and thin Severity:  Moderate Onset quality:  Sudden Duration:  2 days Timing:  Constant Progression:  Worsening Chronicity:  New Relieved by:  Nothing Worsened by:  Nothing Ineffective treatments:  None tried Associated symptoms: vaginal itching   Associated symptoms: no dyspareunia, no dysuria, no fever, no genital lesions, no nausea, no rash, no urinary hesitancy, no urinary incontinence and no vomiting     Past Medical History:  Diagnosis Date  . BV (bacterial vaginosis)   . Chlamydia   . Herpes genitalis in women   . HSV infection   . Miscarriage   . Trichomonas   . UTI (lower urinary tract infection)    Past Surgical History:  Procedure Laterality Date  . NO PAST SURGERIES     Family History  Problem Relation Age of Onset  . Hypertension Mother   . Hypertension Brother    Social History  Substance Use Topics  . Smoking status: Never Smoker  . Smokeless tobacco: Never Used  . Alcohol use No     Comment: socially   OB History    Gravida Para Term Preterm AB Living   4 2 2  0 1 2   SAB TAB Ectopic Multiple Live Births   1 0 0 0 2     Review of Systems  Constitutional: Negative for fever.  Gastrointestinal: Negative for nausea and vomiting.  Genitourinary: Positive for vaginal discharge. Negative for bladder incontinence, dyspareunia, dysuria and hesitancy.    Allergies  Patient has no known allergies.  Home Medications   Prior to Admission medications   Medication Sig Start Date End Date Taking? Authorizing Provider  clindamycin (CLEOCIN) 300 MG capsule Take 1 capsule (300 mg total) by mouth 2 (two) times daily. 05/19/17 05/26/17   Lucia EstelleZheng, Kirill Chatterjee, NP  Doxylamine-Pyridoxine ER (BONJESTA) 20-20 MG TBCR Take 1 tablet by mouth 2 (two) times daily. 05/07/17   Reva BoresPratt, Tanya S, MD  Prenatal Vit-Fe Fumarate-FA (MULTIVITAMIN-PRENATAL) 27-0.8 MG TABS tablet Take 1 tablet by mouth daily at 12 noon.    [provider]  terconazole (TERAZOL 7) 0.4 % vaginal cream Place 1 applicator vaginally at bedtime. 05/19/17 05/26/17  Lucia EstelleZheng, Drezden Seitzinger, NP   Meds Ordered and Administered this Visit  Medications - No data to display  BP 122/70 (BP Location: Right Arm)   Pulse 72   Temp 98.6 F (37 C) (Oral)   Resp 18   LMP 02/15/2017 (Exact Date)   SpO2 100%  No data found.   Physical Exam  Constitutional: She is oriented to person, place, and time. She appears well-developed and well-nourished.  Cardiovascular: Normal rate, regular rhythm and normal heart sounds.   No murmur heard. Pulmonary/Chest: Effort normal and breath sounds normal. She has no wheezes.  Abdominal: Soft. Bowel sounds are normal. There is tenderness.  Genitourinary:  Genitourinary Comments: Labia minora and majora symmetrical with no lesions. Some white discharge noted at the vaginal entry. Vaginal canal is pink and moist with no lesion. Moderate amount of white semi-thick discharge noted in the canal. -CMT  Neurological: She is alert and oriented to person, place, and time.  Skin: Skin is warm and dry.  Psychiatric: She has a normal mood  and affect.  Nursing note and vitals reviewed.   Urgent Care Course     Procedures (including critical care time)  Labs Review Labs Reviewed  CERVICOVAGINAL ANCILLARY ONLY    Imaging Review No results found.  MDM   1. Vaginitis and vulvovaginitis    Differential includes BV and/or vaginal candidiasis. Will treat empirically for both with Clindamycin and Terazole 7.  Cytology also pending for STD She declines HIV today Will call patient with lab results.  F/u with OBGYN for no improvement.      Lucia Estelle,  NP 05/19/17 1549

## 2017-05-19 NOTE — ED Triage Notes (Signed)
Pt  Reports      Vaginal  Discharge   Since  Yesterday   Pt  Is  [redacted]   Weeks  Pregnant       She  Denies   Any   Symptoms     Pt  Is  Ambulatory  To  Room  With a  Steady  Fluid  Gait

## 2017-05-19 NOTE — Discharge Instructions (Signed)
Attached are the prescriptions. Both are SAFE to use during pregnancy. We will call you with your results.

## 2017-05-21 LAB — CERVICOVAGINAL ANCILLARY ONLY
BACTERIAL VAGINITIS: NEGATIVE
CANDIDA VAGINITIS: POSITIVE — AB
Chlamydia: NEGATIVE
Neisseria Gonorrhea: NEGATIVE
Trichomonas: NEGATIVE

## 2017-05-22 ENCOUNTER — Ambulatory Visit (HOSPITAL_COMMUNITY)
Admission: RE | Admit: 2017-05-22 | Discharge: 2017-05-22 | Disposition: A | Discharge status: Home / Self Care | Payer: Medicaid Other | Source: Ambulatory Visit | Attending: Family Medicine | Admitting: Family Medicine

## 2017-05-22 ENCOUNTER — Encounter (HOSPITAL_COMMUNITY): Payer: Self-pay

## 2017-05-22 ENCOUNTER — Other Ambulatory Visit: Payer: Self-pay | Admitting: Family Medicine

## 2017-05-22 DIAGNOSIS — O09891 Supervision of other high risk pregnancies, first trimester: Secondary | ICD-10-CM | POA: Diagnosis not present

## 2017-05-22 DIAGNOSIS — O09899 Supervision of other high risk pregnancies, unspecified trimester: Secondary | ICD-10-CM

## 2017-05-22 DIAGNOSIS — Z3682 Encounter for antenatal screening for nuchal translucency: Secondary | ICD-10-CM

## 2017-05-22 DIAGNOSIS — Z3480 Encounter for supervision of other normal pregnancy, unspecified trimester: Secondary | ICD-10-CM

## 2017-05-22 DIAGNOSIS — Z3A13 13 weeks gestation of pregnancy: Secondary | ICD-10-CM | POA: Diagnosis not present

## 2017-05-22 IMAGING — US US MFM FETAL NUCHAL TRANSLUCENCY
1 series · 15 of 28 positions shown · non-contrast
Comparison: none

[Series 1: us mfm fetal nuchal translucency · 15 of 46 slices shown]
[im 1/46]
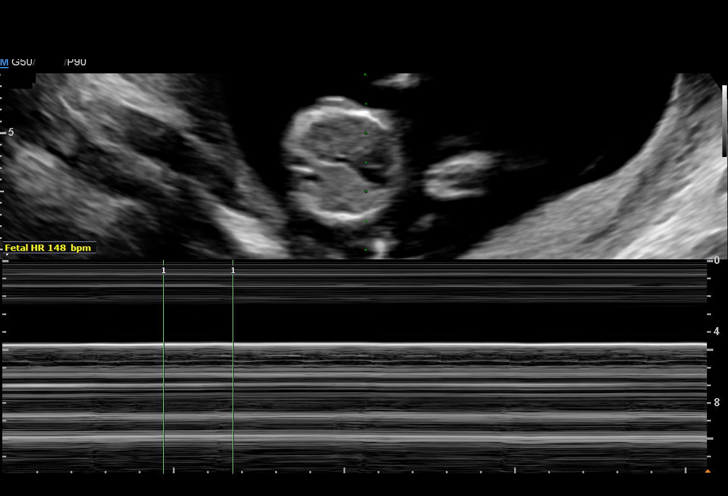
[im 4/46]
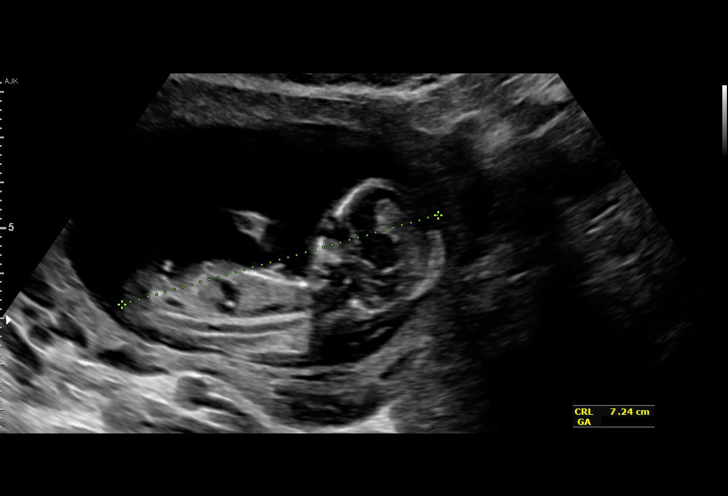
[im 7/46]
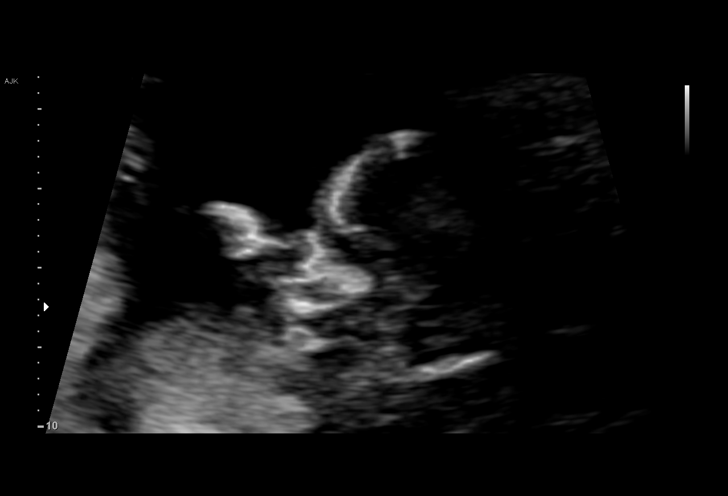
[im 11/46]
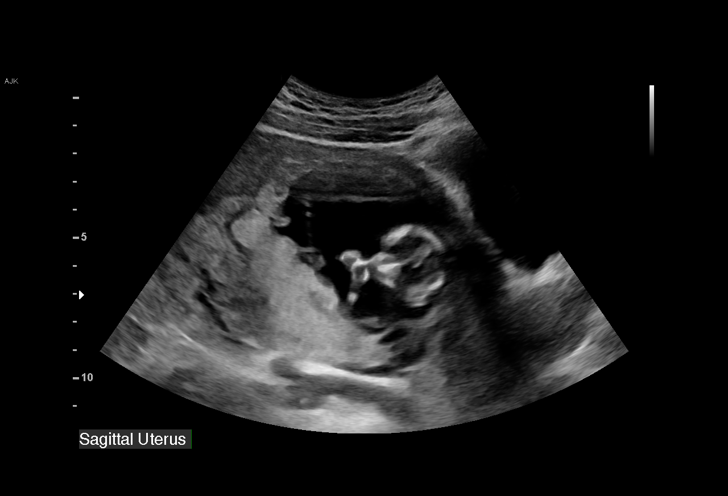
[im 14/46]
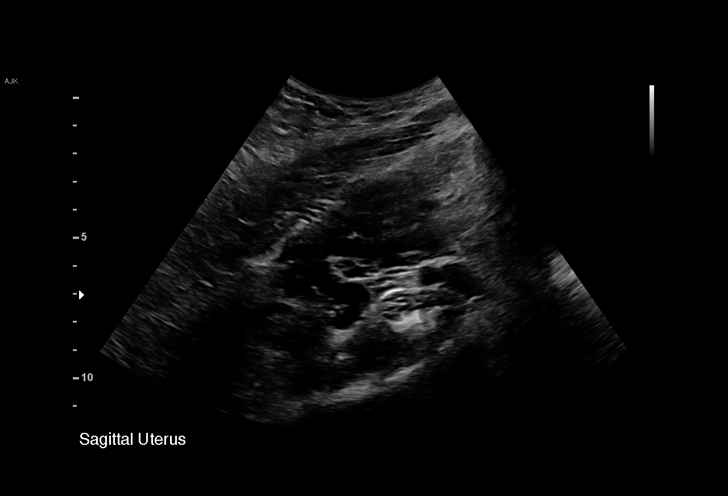
[im 17/46]
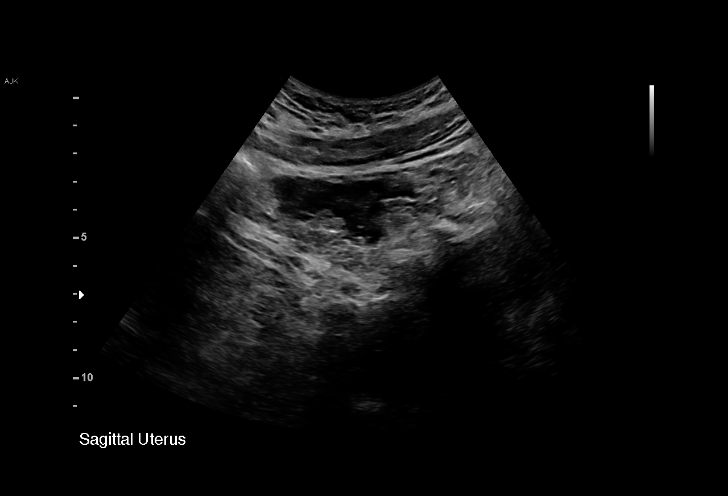
[im 21/46]
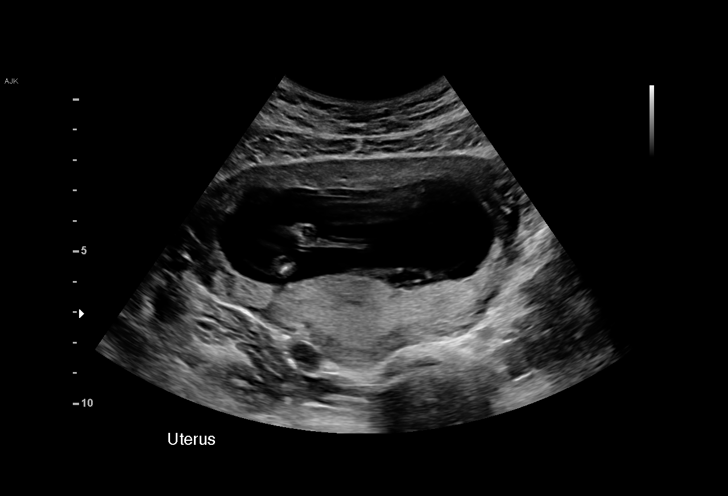
[im 24/46]
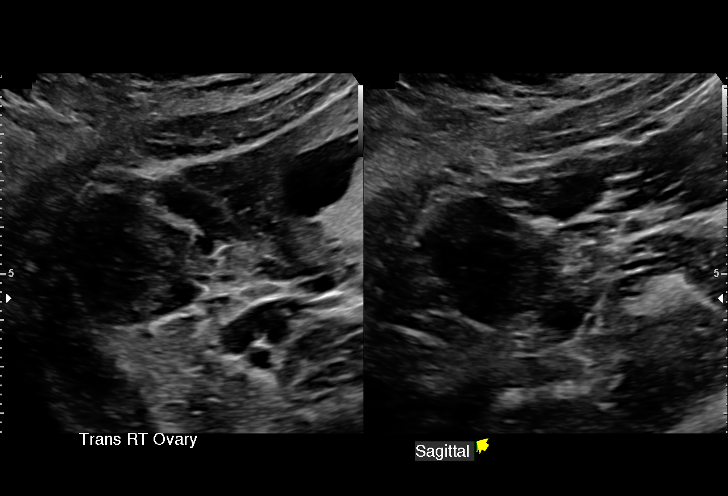
[im 26/46]
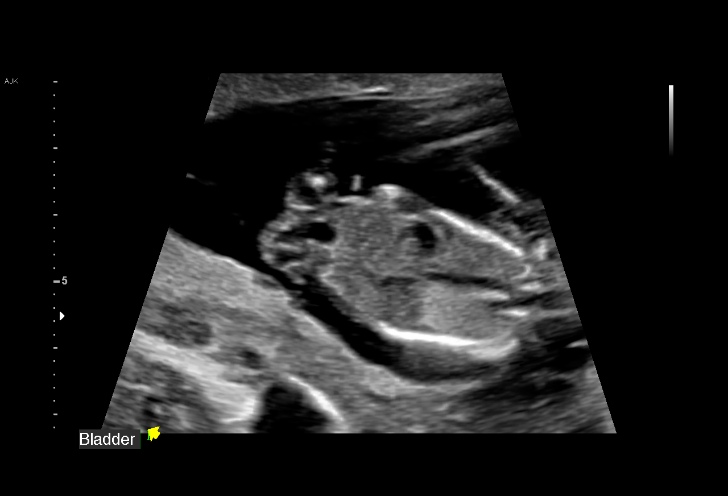
[im 29/46]
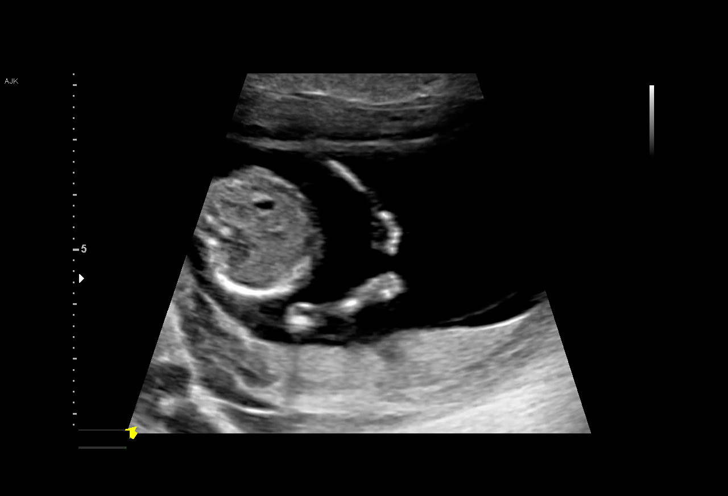
[im 32/46]
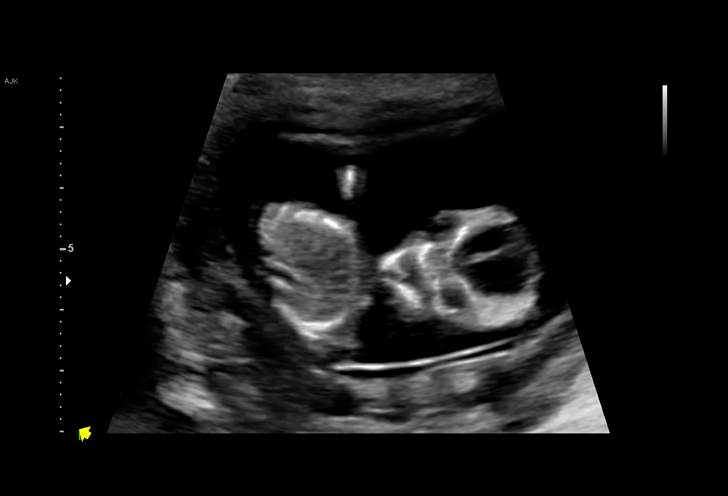
[im 36/46]
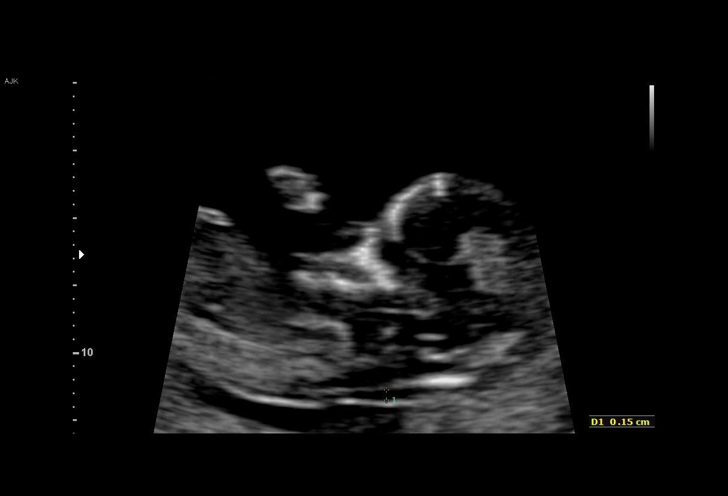
[im 39/46]
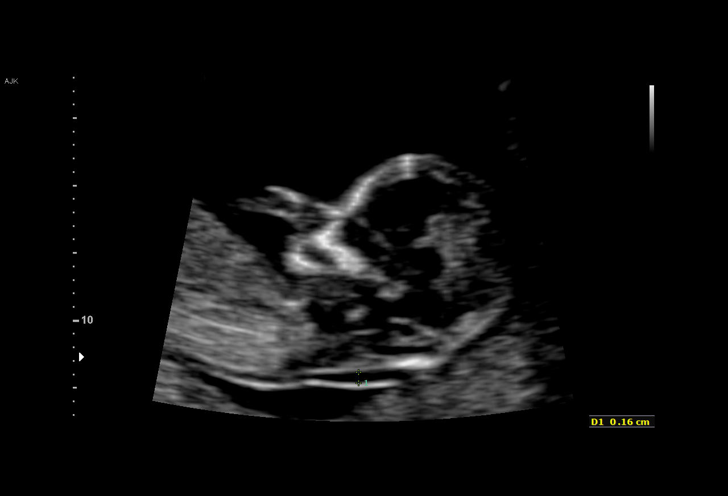
[im 42/46]
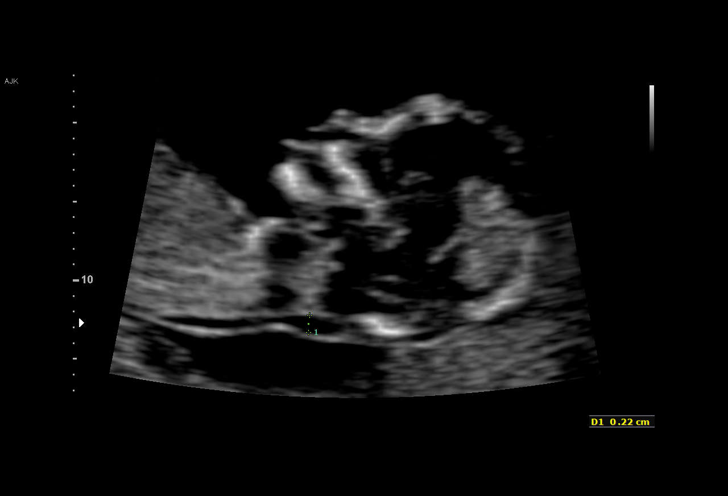
[im 46/46]
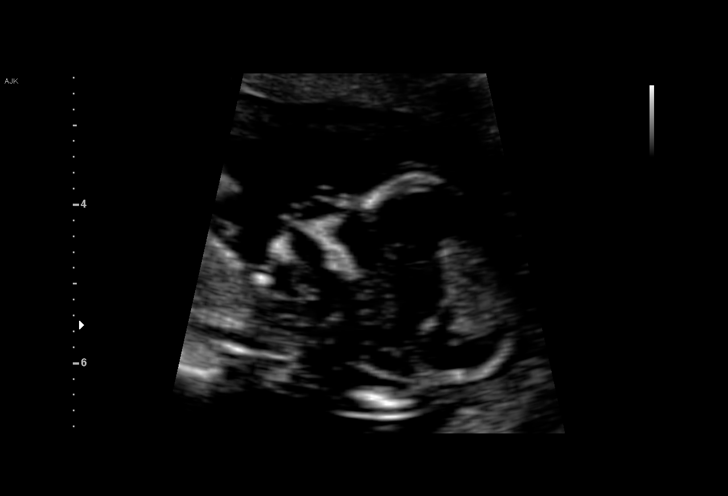

[15 of 28 positions shown; findings below may reference images not displayed]

TRANSLUCENCY

1  PLANCIUNAS              [PHONE_NUMBER]      [PHONE_NUMBER]     [PHONE_NUMBER]
Indications

13 weeks gestation of pregnancy
Encounter for nuchal translucency              [2O]
Short interval between pregancies, 1st         [2O]
trimester
OB History

Blood Type:            Height:  5'4"   Weight (lb):  162      BMI:
Gravidity:    4         Term:   2        Prem:   0        SAB:   1
TOP:          0       Ectopic:  0        Living: 2
Fetal Evaluation

Num Of Fetuses:     1
Fetal Heart         148
Rate(bpm):
Cardiac Activity:   Observed
Presentation:       Variable
Placenta:           Posterior

Amniotic Fluid
AFI FV:      Subjectively within normal limits
Biometry

CRL:      71.6  mm     G. Age:  13w 1d                  EDD:   [DATE]
Gestational Age

LMP:           13w 5d       Date:   [DATE]                 EDD:   [DATE]
Best:          13w 5d    Det. By:   LMP  ([DATE])          EDD:   [DATE]
1st Trimester Genetic Sonogram Screening

CRL:            71.6  mm    G. Age:   13w 1d                 EDD:   [DATE]
Nuc Trans:       1.6  mm
Nasal Bone:                 Present
Anatomy

Choroid Plexus:        Visualized             Bladder:                Visualized
Diaphragm:             Visualized             Upper Extremities:      Visualized
Stomach:               Visualized             Lower Extremities:      Visualized
Cervix Uterus Adnexa

Cervix
Appears closed, without funnelling.

Uterus
No abnormality visualized.

Left Ovary
Within normal limits.

Right Ovary
Within normal limits.

Adnexa:       No abnormality visualized.
Impression

IUP at 13+5 weeks, here for first trimester screening
Normal fetal morphology for this EGA
Normal cardiac activity
CRL confirms assigned EGA
NT measures 1.6mm
Recommendations

Serum analytes will be drawn today
recommend anatomic survey in 6 weeks

## 2017-05-25 ENCOUNTER — Other Ambulatory Visit: Payer: Self-pay

## 2017-05-29 ENCOUNTER — Encounter: Payer: Self-pay | Admitting: *Deleted

## 2017-06-04 ENCOUNTER — Ambulatory Visit (INDEPENDENT_AMBULATORY_CARE_PROVIDER_SITE_OTHER): Payer: Medicaid Other | Admitting: Family Medicine

## 2017-06-04 VITALS — BP 109/66 | HR 84 | Wt 161.0 lb

## 2017-06-04 DIAGNOSIS — O09899 Supervision of other high risk pregnancies, unspecified trimester: Secondary | ICD-10-CM

## 2017-06-04 DIAGNOSIS — Z3480 Encounter for supervision of other normal pregnancy, unspecified trimester: Secondary | ICD-10-CM

## 2017-06-04 DIAGNOSIS — Z3482 Encounter for supervision of other normal pregnancy, second trimester: Secondary | ICD-10-CM

## 2017-06-04 NOTE — Addendum Note (Signed)
Addended by: Gita KudoLASSITER, KRISTEN S on: 06/04/2017 02:25 PM   Modules accepted: Orders

## 2017-06-10 ENCOUNTER — Inpatient Hospital Stay (HOSPITAL_COMMUNITY)
Admission: AD | Admit: 2017-06-10 | Discharge: 2017-06-10 | Disposition: A | Discharge status: Home / Self Care | Payer: Medicaid Other | Source: Ambulatory Visit | Attending: Obstetrics & Gynecology | Admitting: Obstetrics & Gynecology

## 2017-06-10 DIAGNOSIS — Z3A16 16 weeks gestation of pregnancy: Secondary | ICD-10-CM | POA: Diagnosis not present

## 2017-06-10 DIAGNOSIS — K0889 Other specified disorders of teeth and supporting structures: Secondary | ICD-10-CM | POA: Insufficient documentation

## 2017-06-10 DIAGNOSIS — O26892 Other specified pregnancy related conditions, second trimester: Secondary | ICD-10-CM | POA: Diagnosis not present

## 2017-06-10 DIAGNOSIS — K029 Dental caries, unspecified: Secondary | ICD-10-CM | POA: Insufficient documentation

## 2017-06-10 DIAGNOSIS — O9989 Other specified diseases and conditions complicating pregnancy, childbirth and the puerperium: Secondary | ICD-10-CM

## 2017-06-10 DIAGNOSIS — O09899 Supervision of other high risk pregnancies, unspecified trimester: Secondary | ICD-10-CM

## 2017-06-10 DIAGNOSIS — Z3480 Encounter for supervision of other normal pregnancy, unspecified trimester: Secondary | ICD-10-CM

## 2017-06-10 MED ORDER — AMOXICILLIN-POT CLAVULANATE 875-125 MG PO TABS: 1.0000 | ORAL_TABLET | Freq: Two times a day (BID) | ORAL | 0 refills | Status: DC

## 2017-06-10 MED ORDER — OXYCODONE-ACETAMINOPHEN 5-325 MG PO TABS: 1.0000 | ORAL_TABLET | Freq: Four times a day (QID) | ORAL | 0 refills | Status: DC | PRN

## 2017-06-10 NOTE — MAU Provider Note (Signed)
  History     CSN: [id number]  Arrival date and time: 06/10/17 1844   First Provider Initiated Contact with Patient 06/10/17 1854      Chief Complaint  Patient presents with  . Dental Pain   HPI Jody Perez is a 26 y.o. [phone] at 6937w3d who presents to MAU today with complaint of toothache. The patient states that she has had issues with this tooth for "a while" but the pain is much worse today. She denies fever. She also denies abdominal pain or vaginal bleeding today.   OB History    Gravida Para Term Preterm AB Living   4 2 2  0 1 2   SAB TAB Ectopic Multiple Live Births   1 0 0 0 2      Past Medical History:  Diagnosis Date  . BV (bacterial vaginosis)   . Chlamydia   . Herpes genitalis in women   . HSV infection   . Miscarriage   . Trichomonas   . UTI (lower urinary tract infection)     Past Surgical History:  Procedure Laterality Date  . NO PAST SURGERIES      Family History  Problem Relation Age of Onset  . Hypertension Mother   . Hypertension Brother     Social History  Substance Use Topics  . Smoking status: Never Smoker  . Smokeless tobacco: Never Used  . Alcohol use No     Comment: socially    Allergies: No Known Allergies  Prescriptions Prior to Admission  Medication Sig Dispense Refill Last Dose  . Prenatal Vit-Fe Fumarate-FA (MULTIVITAMIN-PRENATAL) 27-0.8 MG TABS tablet Take 1 tablet by mouth daily at 12 noon.   Taking    Review of Systems  Constitutional: Negative for fever.  HENT: Positive for dental problem.   Gastrointestinal: Negative for abdominal pain.  Genitourinary: Negative for vaginal bleeding.   Physical Exam   Blood pressure 112/63, pulse 80, temperature 99 F (37.2 C), temperature source Oral, resp. rate 18, last menstrual period 02/15/2017, SpO2 100 %, unknown if currently breastfeeding.  Physical Exam  Vitals reviewed. Constitutional: She is oriented to person, place, and time. She appears well-developed and  well-nourished.  HENT:  Head: Normocephalic and atraumatic.  Mouth/Throat: Oropharynx is clear and moist and mucous membranes are normal. No oral lesions. Abnormal dentition. Dental caries present. No dental abscesses.    Cardiovascular: Normal rate.   Respiratory: Effort normal.  GI: Soft.  Neurological: She is alert and oriented to person, place, and time.  Skin: Skin is warm and dry. No erythema.  Psychiatric: She has a normal mood and affect.    MAU Course  Procedures None  MDM Patient denies any pregnancy related concerns  Assessment and Plan  A: SIUP at 4737w3d Dental cary Dental pain   P: Discharge home Rx for Augmentin and Percocet given to patient  Warning signs for worsening condition discussed Patient encouraged to follow-up with a dentist ASAP. Dental letter given.  Patient advised to follow-up with [facility] as scheduled for routine prenatal care  Patient may return to MAU as needed or if her condition were to change or worsen   Vonzella NippleJulie Zailyn Rowser, PA-C 06/10/2017, 6:55 PM

## 2017-06-10 NOTE — Discharge Instructions (Signed)
Dental Pain Dental pain may be caused by many things, including:  Tooth decay (cavities or caries). Cavities expose the nerve of your tooth to air and hot or cold temperatures. This can cause pain or discomfort.  Abscess or infection. A dental abscess is a collection of infected pus from a bacterial infection in the inner part of the tooth (pulp). It usually occurs at the end of the tooths root.  Injury.  An unknown reason (idiopathic).  Your pain may be mild or severe. It may only occur when:  You are chewing.  You are exposed to hot or cold temperature.  You are eating or drinking sugary foods or beverages, such as soda or candy.  Your pain may also be constant. Follow these instructions at home: Watch your dental pain for any changes. The following actions may help to lessen any discomfort that you are feeling:  Take medicines only as directed by your dentist.  If you were prescribed an antibiotic medicine, finish all of it even if you start to feel better.  Keep all follow-up visits as directed by your dentist. This is important.  Do not apply heat to the outside of your face.  Rinse your mouth or gargle with salt water if directed by your dentist. This helps with pain and swelling. ? You can make salt water by adding  tsp of salt to 1 cup of warm water.  Apply ice to the painful area of your face: ? Put ice in a plastic bag. ? Place a towel between your skin and the bag. ? Leave the ice on for 20 minutes, 2-3 times per day.  Avoid foods or drinks that cause you pain, such as: ? Very hot or very cold foods or drinks. ? Sweet or sugary foods or drinks.  Contact a health care provider if:  Your pain is not controlled with medicines.  Your symptoms are worse.  You have new symptoms. Get help right away if:  You are unable to open your mouth.  You are having trouble breathing or swallowing.  You have a fever.  Your face, neck, or jaw is swollen. This  information is not intended to replace advice given to you by your health care provider. Make sure you discuss any questions you have with your health care provider. Document Released: 11/13/2005 Document Revised: 03/23/2016 Document Reviewed: 11/09/2014 Elsevier Interactive Patient Education  2017 [employer] Dental Caries Dental caries are spots of decay (cavities) in teeth. They are in the outer layer of your tooth (enamel). Treat them as soon as you can. If they are not treated, they can spread decay and lead to painful infection. Follow these instructions at home: General instructions  Take good care of your mouth and teeth. This keeps them healthy. ? Brush your teeth 2 times a day. Use toothpaste with fluoride in it. ? Floss your teeth once a day.  If your dentist prescribed an antibiotic medicine to treat an infection, take it as told. Do not stop taking the antibiotic even if your condition gets better.  Keep all follow-up visits as told by your dentist. This is important. This includes all cleanings. Preventing dental caries  Brush your teeth every morning and night. Use fluoride toothpaste.  Get regular dental cleanings.  If you are at risk of dental caries. ? Wash your mouth with prescription mouthwash (chlorhexidine). ? Put topical fluoride on your teeth.  Drink water with fluoride in it.  Drink water instead of sugary drinks.  Eat healthy meals and snacks. Contact a doctor if:  You have symptoms of tooth decay. Summary  Dental caries are spots of decay (cavities) in teeth. They are in the outer layer of your tooth.  Take an antibiotic to treat an infection, if told by your dentist. Do not stop taking the antibiotic even if your condition gets better.  Regular dental cleanings and brushing can help prevent dental caries. This information is not intended to replace advice given to you by your health care provider. Make sure you discuss any questions you have with  your health care provider. Document Released: 08/22/2008 Document Revised: 07/30/2016 Document Reviewed: 07/30/2016 Elsevier Interactive Patient Education  2017 [employer].

## 2017-06-10 NOTE — MAU Note (Signed)
Bad toothache, lower rt side, at the back; has been hurting about 3 days. Making her head hurt, has been popping Tylenol- it's not helping.

## 2017-06-15 ENCOUNTER — Encounter (HOSPITAL_COMMUNITY): Payer: Self-pay

## 2017-06-15 ENCOUNTER — Inpatient Hospital Stay (HOSPITAL_COMMUNITY)
Admission: AD | Admit: 2017-06-15 | Discharge: 2017-06-15 | Disposition: A | Discharge status: Home / Self Care | Payer: Medicaid Other | Source: Ambulatory Visit | Attending: Obstetrics & Gynecology | Admitting: Obstetrics & Gynecology

## 2017-06-15 DIAGNOSIS — O26892 Other specified pregnancy related conditions, second trimester: Secondary | ICD-10-CM | POA: Insufficient documentation

## 2017-06-15 DIAGNOSIS — O210 Mild hyperemesis gravidarum: Secondary | ICD-10-CM | POA: Diagnosis not present

## 2017-06-15 DIAGNOSIS — O368120 Decreased fetal movements, second trimester, not applicable or unspecified: Secondary | ICD-10-CM | POA: Diagnosis present

## 2017-06-15 DIAGNOSIS — Z3A17 17 weeks gestation of pregnancy: Secondary | ICD-10-CM | POA: Insufficient documentation

## 2017-06-15 DIAGNOSIS — K047 Periapical abscess without sinus: Secondary | ICD-10-CM | POA: Diagnosis not present

## 2017-06-15 DIAGNOSIS — O219 Vomiting of pregnancy, unspecified: Secondary | ICD-10-CM

## 2017-06-15 DIAGNOSIS — O9989 Other specified diseases and conditions complicating pregnancy, childbirth and the puerperium: Secondary | ICD-10-CM | POA: Diagnosis not present

## 2017-06-15 DIAGNOSIS — O09899 Supervision of other high risk pregnancies, unspecified trimester: Secondary | ICD-10-CM

## 2017-06-15 DIAGNOSIS — K0889 Other specified disorders of teeth and supporting structures: Secondary | ICD-10-CM | POA: Diagnosis not present

## 2017-06-15 DIAGNOSIS — Z3480 Encounter for supervision of other normal pregnancy, unspecified trimester: Secondary | ICD-10-CM

## 2017-06-15 LAB — URINALYSIS, ROUTINE W REFLEX MICROSCOPIC
Bilirubin Urine: NEGATIVE
Glucose, UA: NEGATIVE mg/dL
HGB URINE DIPSTICK: NEGATIVE
Ketones, ur: 5 mg/dL — AB
NITRITE: NEGATIVE
PROTEIN: NEGATIVE mg/dL
SPECIFIC GRAVITY, URINE: 1.02 (ref 1.005–1.030)
pH: 6 (ref 5.0–8.0)

## 2017-06-15 MED ORDER — ONDANSETRON 8 MG PO TBDP
8.0000 mg | ORAL_TABLET | Freq: Once | ORAL | Status: AC
  Administered 2017-06-15: 8 mg via ORAL
  Filled 2017-06-15 (×2): qty 1

## 2017-06-15 MED ORDER — ACETAMINOPHEN 500 MG PO TABS
1000.0000 mg | ORAL_TABLET | Freq: Once | ORAL | Status: AC
  Administered 2017-06-15: 1000 mg via ORAL
  Filled 2017-06-15: qty 2

## 2017-06-15 MED ORDER — ONDANSETRON 8 MG PO TBDP: 8.0000 mg | ORAL_TABLET | Freq: Three times a day (TID) | ORAL | 0 refills | Status: DC | PRN

## 2017-06-15 NOTE — MAU Note (Signed)
Was here a wk ago for toothache. Given Kimrey pain med and antibiotic for tooth and kept making me sick so I stopped taking both. Was feeling baby flutters and now not feeling them. Denies LOF or bleeding. Unable to keep down anything. Still has toothache R lower which will be removed Monday.

## 2017-06-15 NOTE — Discharge Instructions (Signed)
Dental Abscess A dental abscess is pus in or around a tooth. Follow these instructions at home:  Take medicines only as told by your dentist.  If you were prescribed antibiotic medicine, finish all of it even if you start to feel better.  Rinse your mouth (gargle) often with salt water.  Do not drive or use heavy machinery, like a lawn mower, while taking pain medicine.  Do not apply heat to the outside of your mouth.  Keep all follow-up visits as told by your dentist. This is important. Contact a doctor if:  Your pain is worse, and medicine does not help. Get help right away if:  You have a fever or chills.  Your symptoms suddenly get worse.  You have a very bad headache.  You have problems breathing or swallowing.  You have trouble opening your mouth.  You have puffiness (swelling) in your neck or around your eye. This information is not intended to replace advice given to you by your health care provider. Make sure you discuss any questions you have with your health care provider. Document Released: 03/30/2015 Document Revised: 04/20/2016 Document Reviewed: 11/10/2014 Elsevier Interactive Patient Education  2018 Elsevier Inc.  

## 2017-06-15 NOTE — MAU Note (Signed)
Unable to void currently. Has cup and aware needs u/a when able to go

## 2017-06-15 NOTE — MAU Provider Note (Signed)
History     CSN: [id number]  Arrival date and time: 06/15/17 2051   First Provider Initiated Contact with Patient 06/15/17 2240     Chief Complaint  Patient presents with  . Decreased Fetal Movement   HPI Jody Perez is a 26 y.o. [id number] at 5924w1d who presents with nausea and vomiting and a toothache. She was seen in MAU on 7/15 and prescribed percocet and augmentin. She states she took the medication for 2 days and then stopped because it was making her vomit. She has an appointment on Monday 7/23 to have her tooth removed. She is also concerned because she was feeling the baby move and hasn't felt the baby today.   OB History    Gravida Para Term Preterm AB Living   4 2 2  0 1 2   SAB TAB Ectopic Multiple Live Births   1 0 0 0 2      Past Medical History:  Diagnosis Date  . BV (bacterial vaginosis)   . Chlamydia   . Herpes genitalis in women   . HSV infection   . Miscarriage   . Trichomonas   . UTI (lower urinary tract infection)     Past Surgical History:  Procedure Laterality Date  . NO PAST SURGERIES      Family History  Problem Relation Age of Onset  . Hypertension Mother   . Hypertension Brother     Social History  Substance Use Topics  . Smoking status: Never Smoker  . Smokeless tobacco: Never Used  . Alcohol use No     Comment: socially    Allergies: No Known Allergies  Prescriptions Prior to Admission  Medication Sig Dispense Refill Last Dose  . acetaminophen (TYLENOL) 500 MG tablet Take 500 mg by mouth every 6 (six) hours as needed.   06/15/2017 at Unknown time  . Prenatal Vit-Fe Fumarate-FA (MULTIVITAMIN-PRENATAL) 27-0.8 MG TABS tablet Take 1 tablet by mouth daily at 12 noon.   06/15/2017 at Unknown time  . amoxicillin-clavulanate (AUGMENTIN) 875-125 MG tablet Take 1 tablet by mouth every 12 (twelve) hours. 14 tablet 0   . oxyCODONE-acetaminophen (PERCOCET/ROXICET) 5-325 MG tablet Take 1 tablet by mouth every 6 (six) hours as needed for severe  pain. 20 tablet 0     Review of Systems  Constitutional: Negative.  Negative for chills and fever.  HENT:       Toothache  Respiratory: Negative.  Negative for shortness of breath.   Cardiovascular: Negative.  Negative for chest pain.  Gastrointestinal: Positive for nausea and vomiting. Negative for abdominal pain, constipation and diarrhea.  Genitourinary: Negative.  Negative for dysuria, vaginal bleeding and vaginal discharge.  Neurological: Negative.  Negative for dizziness and headaches.  Psychiatric/Behavioral: Negative.    Physical Exam   Blood pressure 124/77, pulse 76, temperature 98.1 F (36.7 C), temperature source Oral, resp. rate 18, height 5\' 4"  (1.626 m), weight 160 lb (72.6 kg), last menstrual period 02/15/2017, unknown if currently breastfeeding.  Physical Exam  Nursing note and vitals reviewed. Constitutional: She appears well-developed and well-nourished.  HENT:  Head: Normocephalic and atraumatic.  Mouth/Throat: Dental caries present.  Eyes: Conjunctivae are normal. No scleral icterus.  Cardiovascular: Normal rate, regular rhythm and normal heart sounds.   Respiratory: Effort normal and breath sounds normal. No respiratory distress.  Neurological: She is alert.  Skin: Skin is warm and dry.  Psychiatric: She has a normal mood and affect. Her behavior is normal. Judgment and thought content normal.   FHT: 150  MAU Course  Procedures Results for orders placed or performed during the hospital encounter of 06/15/17 (from the past 24 hour(s))  Urinalysis, Routine w reflex microscopic     Status: Abnormal   Collection Time: 06/15/17 10:25 PM  Result Value Ref Range   Color, Urine YELLOW YELLOW   APPearance HAZY (A) CLEAR   Specific Gravity, Urine 1.020 1.005 - 1.030   pH 6.0 5.0 - 8.0   Glucose, UA NEGATIVE NEGATIVE mg/dL   Hgb urine dipstick NEGATIVE NEGATIVE   Bilirubin Urine NEGATIVE NEGATIVE   Ketones, ur 5 (A) NEGATIVE mg/dL   Protein, ur NEGATIVE  NEGATIVE mg/dL   Nitrite NEGATIVE NEGATIVE   Leukocytes, UA TRACE (A) NEGATIVE   RBC / HPF 0-5 0 - 5 RBC/hpf   WBC, UA 0-5 0 - 5 WBC/hpf   Bacteria, UA RARE (A) NONE SEEN   Squamous Epithelial / LPF 0-5 (A) NONE SEEN   Mucous PRESENT    MDM UA Tylenol 1000mg  PO Zofran 8mg  ODT Patient reports relief and requesting discharge home Assessment and Plan   1. Infected tooth   2. Supervision of other normal pregnancy, antepartum   3. Short interval between pregnancies affecting pregnancy, antepartum   4. Nausea/vomiting in pregnancy    -Discharge patient home in stable condition -Prescription for zofran given to patient -Encouraged patient to take pain medication as prescribed for tooth pain and keep appointment on Monday for extraction -Follow up with [facility] as scheduled for prenatal care -Encouraged to return here or to other Urgent Care/ED if she develops worsening of symptoms, increase in pain, fever, or other concerning symptoms.   Cleone Slim SNM 06/15/2017, 10:54 PM   I confirm that I have verified the information documented in the nurse midwife student's note and that I have also personally reperformed the physical exam and all medical decision making activities.   Thressa Sheller 12:40 AM 06/16/17

## 2017-06-27 ENCOUNTER — Ambulatory Visit (HOSPITAL_COMMUNITY)
Admission: RE | Admit: 2017-06-27 | Discharge: 2017-06-27 | Disposition: A | Discharge status: Home / Self Care | Payer: Medicaid Other | Source: Ambulatory Visit | Attending: Family Medicine | Admitting: Family Medicine

## 2017-06-27 ENCOUNTER — Other Ambulatory Visit: Payer: Self-pay | Admitting: Family Medicine

## 2017-06-27 DIAGNOSIS — Z3480 Encounter for supervision of other normal pregnancy, unspecified trimester: Secondary | ICD-10-CM

## 2017-06-27 DIAGNOSIS — O09892 Supervision of other high risk pregnancies, second trimester: Secondary | ICD-10-CM | POA: Insufficient documentation

## 2017-06-27 DIAGNOSIS — Z3689 Encounter for other specified antenatal screening: Secondary | ICD-10-CM

## 2017-06-27 DIAGNOSIS — Z3A18 18 weeks gestation of pregnancy: Secondary | ICD-10-CM | POA: Diagnosis not present

## 2017-06-27 IMAGING — US US MFM OB COMP +14 WKS
1 series · 14 of 28 positions shown · non-contrast
Comparison: none

[Series 1: us mfm ob comp +14 wks · 118 acquisitions, 14 frames shown]
[im 5/118]
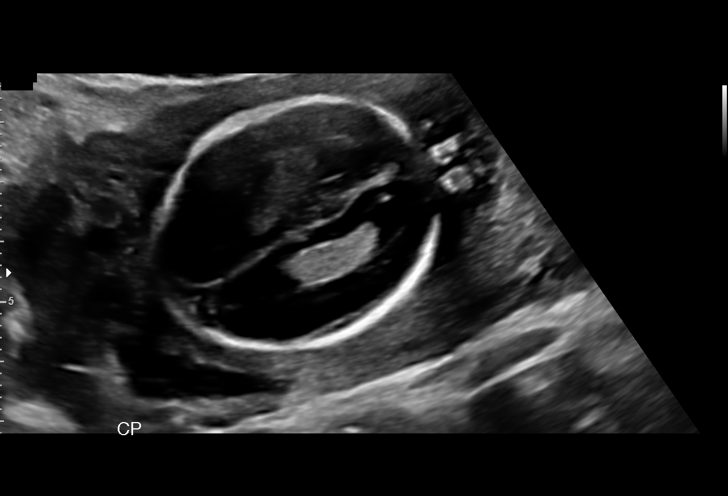
[im 14/118]
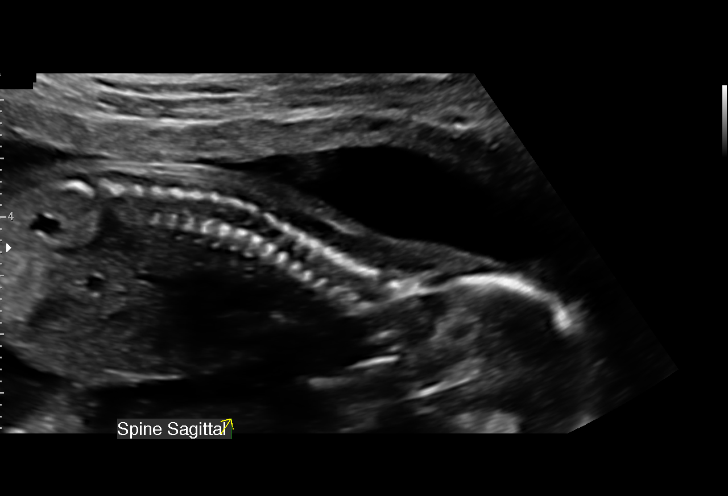
[im 22/118]
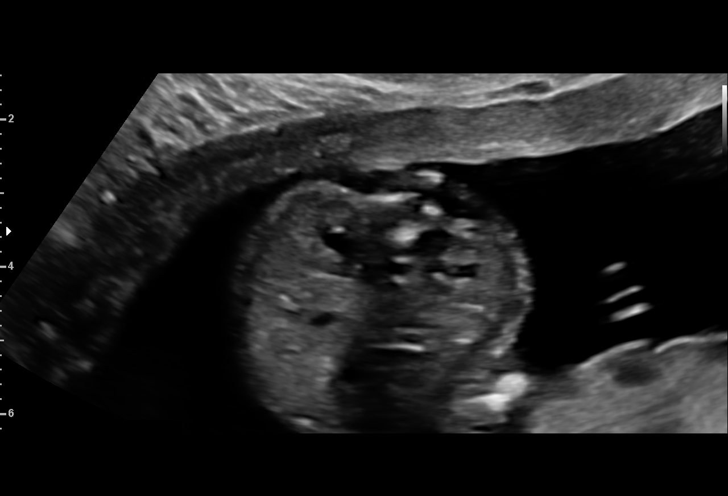
[im 31/118]
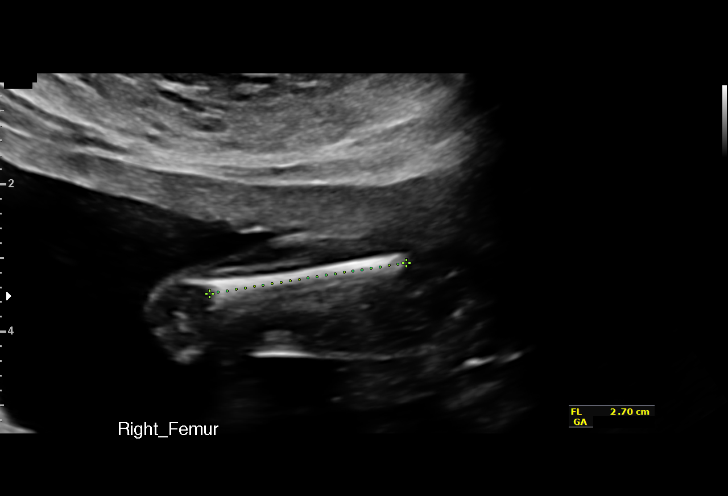
[im 40/118]
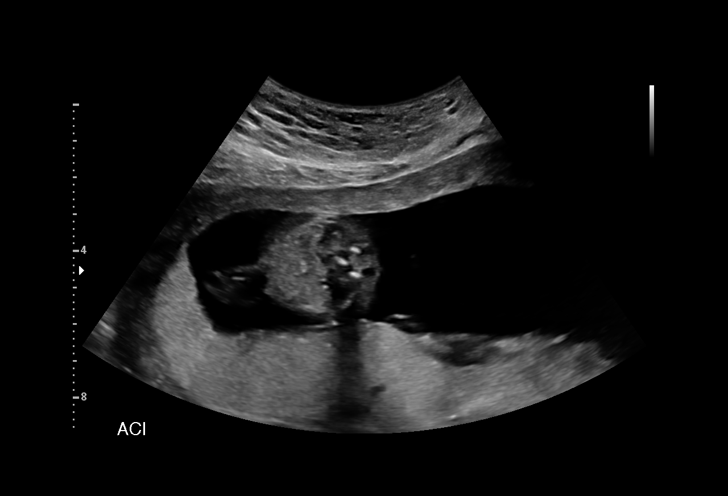
[im 48/118]
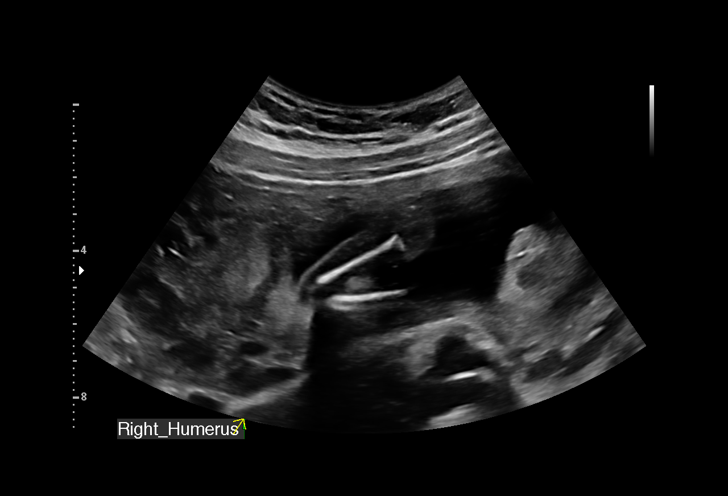
[im 57/118]
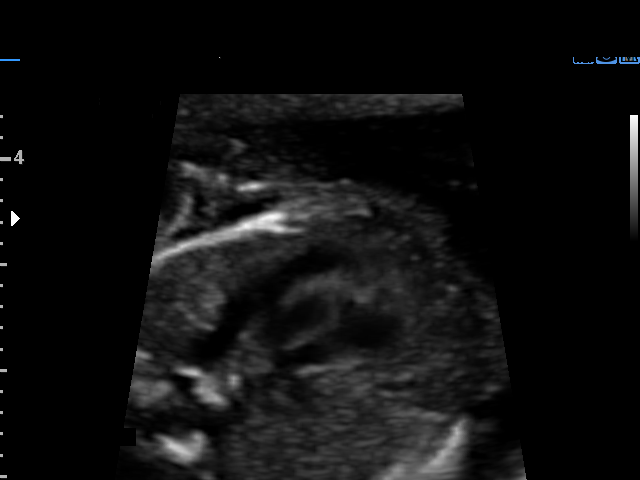
[im 66/118]
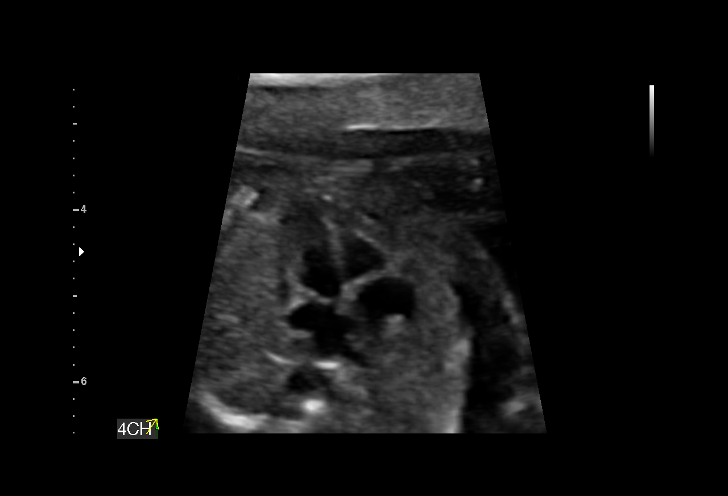
[im 74/118]
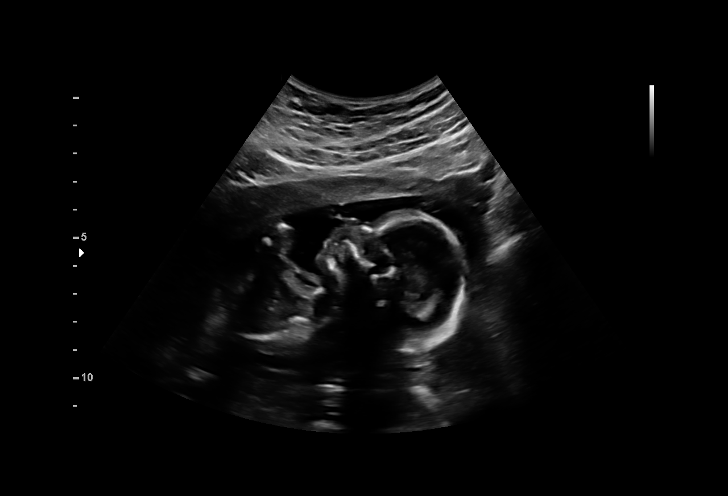
[im 83/118]
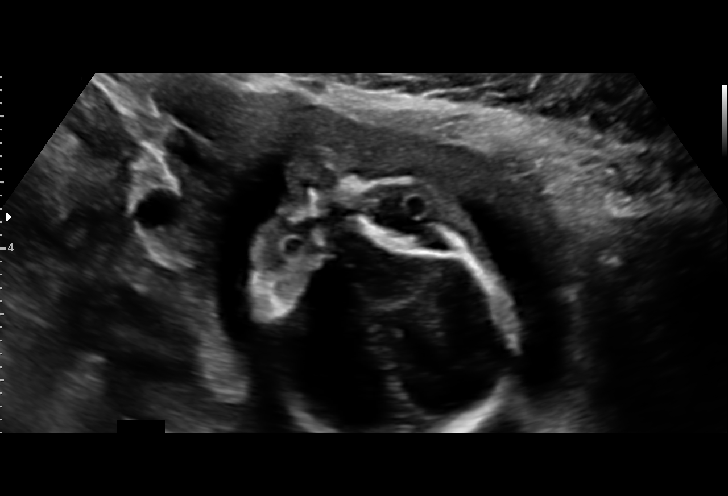
[im 92/118]
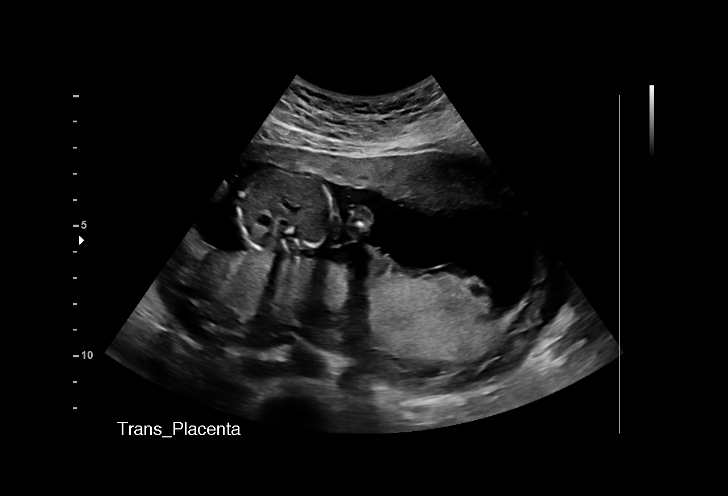
[im 100/118]
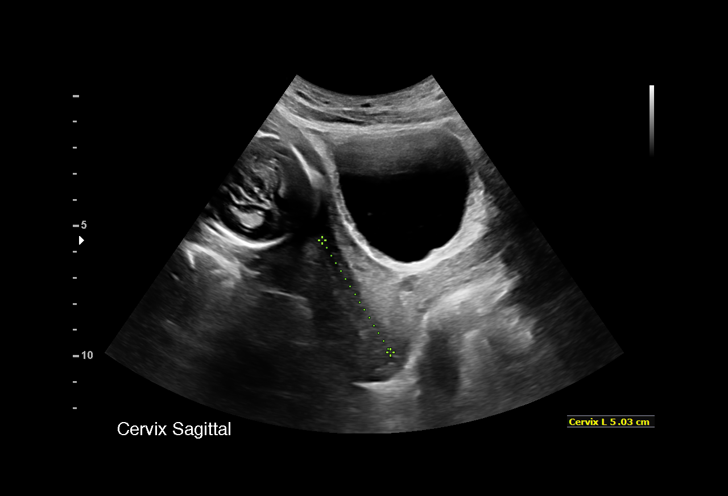
[im 109/118]
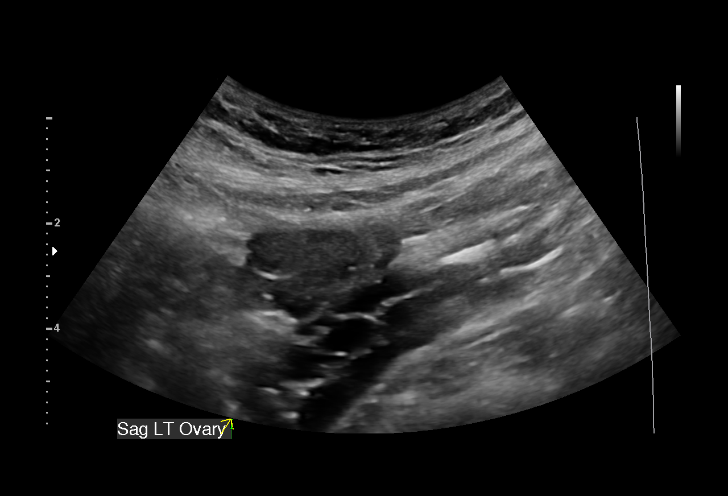
[im 118/118]
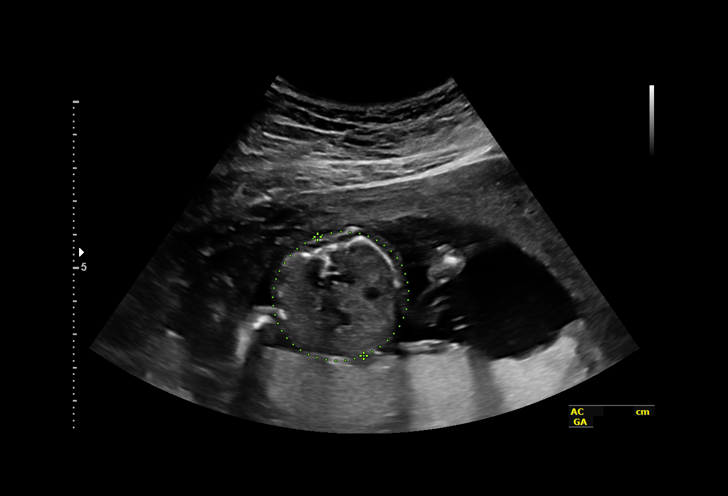

[14 of 28 positions shown; findings below may reference images not displayed]

Indications

18 weeks gestation of pregnancy
Short interval between pregancies, 1st         [6I]
trimester
Encounter for fetal anatomic survey            [6I]
OB History

Blood Type:            Height:  5'4"   Weight (lb):  162      BMI:
Gravidity:    4         Term:   2        Prem:   0        SAB:   1
TOP:          0       Ectopic:  0        Living: 2
Fetal Evaluation

Num Of Fetuses:     1
Fetal Heart         132
Rate(bpm):
Cardiac Activity:   Observed
Presentation:       Cephalic
Placenta:           Posterior, above cervical os
P. Cord Insertion:  Visualized

Amniotic Fluid
AFI FV:      Subjectively within normal limits

Largest Pocket(cm)
5.32
Biometry

BPD:      41.1  mm     G. Age:  18w 3d         33  %    CI:        72.44   %   70 - 86
FL/HC:      17.2   %   16.1 -
HC:      153.6  mm     G. Age:  18w 3d         20  %    HC/AC:      1.24       1.09 -
AC:      124.3  mm     G. Age:  18w 0d         22  %    FL/BPD:     64.2   %
FL:       26.4  mm     G. Age:  18w 0d         18  %    FL/AC:      21.2   %   20 - 24
HUM:      27.2  mm     G. Age:  18w 5d         46  %
CER:      19.2  mm     G. Age:  18w 4d         42  %
NFT:       4.4  mm

CM:        5.8  mm
Est. FW:     223  gm      0 lb 8 oz     30  %
Gestational Age

LMP:           18w 6d       Date:   [DATE]                 EDD:   [DATE]
U/S Today:     18w 2d                                        EDD:   [DATE]
Best:          18w 6d    Det. By:   LMP  ([DATE])          EDD:   [DATE]
Anatomy

Cranium:               Appears normal         Aortic Arch:            Appears normal
Cavum:                 Appears normal         Ductal Arch:            Appears normal
Ventricles:            Appears normal         Diaphragm:              Appears normal
Choroid Plexus:        Appears normal         Stomach:                Appears normal, left
sided
Cerebellum:            Appears normal         Abdomen:                Appears normal
Posterior Fossa:       Appears normal         Abdominal Wall:         Appears nml (cord
insert, abd wall)
Nuchal Fold:           Appears normal         Cord Vessels:           Appears normal (3
vessel cord)
Face:                  Appears normal         Kidneys:                Appear normal
(orbits and profile)
Lips:                  Appears normal         Bladder:                Appears normal
Thoracic:              Appears normal         Spine:                  Appears normal
Heart:                 Appears normal         Upper Extremities:      Appears normal
(4CH, axis, and situs
RVOT:                  Appears normal         Lower Extremities:      Appears normal
LVOT:                  Appears normal

Other:  Fetus appears to be a male. Heels visualized.
Cervix Uterus Adnexa

Cervix
Length:           5.03  cm.
Normal appearance by transabdominal scan.

Uterus
No abnormality visualized.

Left Ovary
Size(cm)     2.23  x    1.89   x  3.16      Vol(ml): 7

Right Ovary
Size(cm)     4.04  x    1.85   x  3.18      Vol(ml):
Impression

Single IUP at 18w 6d
Normal fetal anatomic survey
Ultrasound measurements are consistent with LMP
Posterior placenta without previa
Normal amniotic fluid volume
Recommendations

Follow-up ultrasounds as clinically indicated.

## 2017-07-02 ENCOUNTER — Encounter: Payer: Medicaid Other | Admitting: Obstetrics and Gynecology

## 2017-07-10 ENCOUNTER — Ambulatory Visit (INDEPENDENT_AMBULATORY_CARE_PROVIDER_SITE_OTHER): Payer: Medicaid Other | Admitting: Family Medicine

## 2017-07-10 VITALS — BP 119/66 | HR 73 | Wt 163.0 lb

## 2017-07-10 DIAGNOSIS — Z3482 Encounter for supervision of other normal pregnancy, second trimester: Secondary | ICD-10-CM

## 2017-07-10 DIAGNOSIS — Z3480 Encounter for supervision of other normal pregnancy, unspecified trimester: Secondary | ICD-10-CM

## 2017-07-10 NOTE — Patient Instructions (Signed)
 Second Trimester of Pregnancy The second trimester is from week 14 through week 27 (months 4 through 6). The second trimester is often a time when you feel your best. Your body has adjusted to being pregnant, and you begin to feel better physically. Usually, morning sickness has lessened or quit completely, you may have more energy, and you may have an increase in appetite. The second trimester is also a time when the fetus is growing rapidly. At the end of the sixth month, the fetus is about 9 inches long and weighs about 1 pounds. You will likely begin to feel the baby move (quickening) between 16 and 20 weeks of pregnancy. Body changes during your second trimester Your body continues to go through many changes during your second trimester. The changes vary from woman to woman.  Your weight will continue to increase. You will notice your lower abdomen bulging out.  You may begin to get stretch marks on your hips, abdomen, and breasts.  You may develop headaches that can be relieved by medicines. The medicines should be approved by your health care provider.  You may urinate more often because the fetus is pressing on your bladder.  You may develop or continue to have heartburn as a result of your pregnancy.  You may develop constipation because certain hormones are causing the muscles that push waste through your intestines to slow down.  You may develop hemorrhoids or swollen, bulging veins (varicose veins).  You may have back pain. This is caused by: ? Weight gain. ? Pregnancy hormones that are relaxing the joints in your pelvis. ? A shift in weight and the muscles that support your balance.  Your breasts will continue to grow and they will continue to become tender.  Your gums may bleed and may be sensitive to brushing and flossing.  Dark spots or blotches (chloasma, mask of pregnancy) may develop on your face. This will likely fade after the baby is born.  A dark line from  your belly button to the pubic area (linea nigra) may appear. This will likely fade after the baby is born.  You may have changes in your hair. These can include thickening of your hair, rapid growth, and changes in texture. Some women also have hair loss during or after pregnancy, or hair that feels dry or thin. Your hair will most likely return to normal after your baby is born.  What to expect at prenatal visits During a routine prenatal visit:  You will be weighed to make sure you and the fetus are growing normally.  Your blood pressure will be taken.  Your abdomen will be measured to track your baby's growth.  The fetal heartbeat will be listened to.  Any test results from the previous visit will be discussed.  Your health care provider may ask you:  How you are feeling.  If you are feeling the baby move.  If you have had any abnormal symptoms, such as leaking fluid, bleeding, severe headaches, or abdominal cramping.  If you are using any tobacco products, including cigarettes, chewing tobacco, and electronic cigarettes.  If you have any questions.  Other tests that may be performed during your second trimester include:  Blood tests that check for: ? Low iron levels (anemia). ? High blood sugar that affects pregnant women (gestational diabetes) between 24 and 28 weeks. ? Rh antibodies. This is to check for a protein on red blood cells (Rh factor).  Urine tests to check for infections, diabetes,   or protein in the urine.  An ultrasound to confirm the proper growth and development of the baby.  An amniocentesis to check for possible genetic problems.  Fetal screens for spina bifida and Down syndrome.  HIV (human immunodeficiency virus) testing. Routine prenatal testing includes screening for HIV, unless you choose not to have this test.  Follow these instructions at home: Medicines  Follow your health care provider's instructions regarding medicine use. Specific  medicines may be either safe or unsafe to take during pregnancy.  Take a prenatal vitamin that contains at least 600 micrograms (mcg) of folic acid.  If you develop constipation, try taking a stool softener if your health care provider approves. Eating and drinking  Eat a balanced diet that includes fresh fruits and vegetables, whole grains, good sources of protein such as meat, eggs, or tofu, and low-fat dairy. Your health care provider will help you determine the amount of weight gain that is right for you.  Avoid raw meat and uncooked cheese. These carry germs that can cause birth defects in the baby.  If you have low calcium intake from food, talk to your health care provider about whether you should take a daily calcium supplement.  Limit foods that are high in fat and processed sugars, such as fried and sweet foods.  To prevent constipation: ? Drink enough fluid to keep your urine clear or pale yellow. ? Eat foods that are high in fiber, such as fresh fruits and vegetables, whole grains, and beans. Activity  Exercise only as directed by your health care provider. Most women can continue their usual exercise routine during pregnancy. Try to exercise for 30 minutes at least 5 days a week. Stop exercising if you experience uterine contractions.  Avoid heavy lifting, wear low heel shoes, and practice good posture.  A sexual relationship may be continued unless your health care provider directs you otherwise. Relieving pain and discomfort  Wear a good support bra to prevent discomfort from breast tenderness.  Take warm sitz baths to soothe any pain or discomfort caused by hemorrhoids. Use hemorrhoid cream if your health care provider approves.  Rest with your legs elevated if you have leg cramps or low back pain.  If you develop varicose veins, wear support hose. Elevate your feet for 15 minutes, 3-4 times a day. Limit salt in your diet. Prenatal Care  Write down your questions.  Take them to your prenatal visits.  Keep all your prenatal visits as told by your health care provider. This is important. Safety  Wear your seat belt at all times when driving.  Make a list of emergency phone numbers, including numbers for family, friends, the hospital, and police and fire departments. General instructions  Ask your health care provider for a referral to a local prenatal education class. Begin classes no later than the beginning of month 6 of your pregnancy.  Ask for help if you have counseling or nutritional needs during pregnancy. Your health care provider can offer advice or refer you to specialists for help with various needs.  Do not use hot tubs, steam rooms, or saunas.  Do not douche or use tampons or scented sanitary pads.  Do not cross your legs for long periods of time.  Avoid cat litter boxes and soil used by cats. These carry germs that can cause birth defects in the baby and possibly loss of the fetus by miscarriage or stillbirth.  Avoid all smoking, herbs, alcohol, and unprescribed drugs. Chemicals in these products   can affect the formation and growth of the baby.  Do not use any products that contain nicotine or tobacco, such as cigarettes and e-cigarettes. If you need help quitting, ask your health care provider.  Visit your dentist if you have not gone yet during your pregnancy. Use a soft toothbrush to brush your teeth and be gentle when you floss. Contact a health care provider if:  You have dizziness.  You have mild pelvic cramps, pelvic pressure, or nagging pain in the abdominal area.  You have persistent nausea, vomiting, or diarrhea.  You have a bad smelling vaginal discharge.  You have pain when you urinate. Get help right away if:  You have a fever.  You are leaking fluid from your vagina.  You have spotting or bleeding from your vagina.  You have severe abdominal cramping or pain.  You have rapid weight gain or weight  loss.  You have shortness of breath with chest pain.  You notice sudden or extreme swelling of your face, hands, ankles, feet, or legs.  You have not felt your baby move in over an hour.  You have severe headaches that do not go away when you take medicine.  You have vision changes. Summary  The second trimester is from week 14 through week 27 (months 4 through 6). It is also a time when the fetus is growing rapidly.  Your body goes through many changes during pregnancy. The changes vary from woman to woman.  Avoid all smoking, herbs, alcohol, and unprescribed drugs. These chemicals affect the formation and growth your baby.  Do not use any tobacco products, such as cigarettes, chewing tobacco, and e-cigarettes. If you need help quitting, ask your health care provider.  Contact your health care provider if you have any questions. Keep all prenatal visits as told by your health care provider. This is important. This information is not intended to replace advice given to you by your health care provider. Make sure you discuss any questions you have with your health care provider. Document Released: 11/07/2001 Document Revised: 04/20/2016 Document Reviewed: 01/14/2013 [employer] Interactive Patient Education  2017 [employer]   Breastfeeding Deciding to breastfeed is one of the best choices you can make for you and your baby. A change in hormones during pregnancy causes your breast tissue to grow and increases the number and size of your milk ducts. These hormones also allow proteins, sugars, and fats from your blood supply to make breast milk in your milk-producing glands. Hormones prevent breast milk from being released before your baby is born as well as prompt milk flow after birth. Once breastfeeding has begun, thoughts of your baby, as well as his or her sucking or crying, can stimulate the release of milk from your milk-producing glands. Benefits of breastfeeding For Your  Baby  Your first milk (colostrum) helps your baby's digestive system function better.  There are antibodies in your milk that help your baby fight off infections.  Your baby has a lower incidence of asthma, allergies, and sudden infant death syndrome.  The nutrients in breast milk are better for your baby than infant formulas and are designed uniquely for your baby's needs.  Breast milk improves your baby's brain development.  Your baby is less likely to develop other conditions, such as childhood obesity, asthma, or type 2 diabetes mellitus.  For You  Breastfeeding helps to create a very special bond between you and your baby.  Breastfeeding is convenient. Breast milk is always available at   the correct temperature and costs nothing.  Breastfeeding helps to burn calories and helps you lose the weight gained during pregnancy.  Breastfeeding makes your uterus contract to its prepregnancy size faster and slows bleeding (lochia) after you give birth.  Breastfeeding helps to lower your risk of developing type 2 diabetes mellitus, osteoporosis, and breast or ovarian cancer later in life.  Signs that your baby is hungry Early Signs of Hunger  Increased alertness or activity.  Stretching.  Movement of the head from side to side.  Movement of the head and opening of the mouth when the corner of the mouth or cheek is stroked (rooting).  Increased sucking sounds, smacking lips, cooing, sighing, or squeaking.  Hand-to-mouth movements.  Increased sucking of fingers or hands.  Late Signs of Hunger  Fussing.  Intermittent crying.  Extreme Signs of Hunger Signs of extreme hunger will require calming and consoling before your baby will be able to breastfeed successfully. Do not wait for the following signs of extreme hunger to occur before you initiate breastfeeding:  Restlessness.  A loud, Robin cry.  Screaming.  Breastfeeding basics Breastfeeding Initiation  Find a  comfortable place to sit or lie down, with your neck and back well supported.  Place a pillow or rolled up blanket under your baby to bring him or her to the level of your breast (if you are seated). Nursing pillows are specially designed to help support your arms and your baby while you breastfeed.  Make sure that your baby's abdomen is facing your abdomen.  Gently massage your breast. With your fingertips, massage from your chest wall toward your nipple in a circular motion. This encourages milk flow. You may need to continue this action during the feeding if your milk flows slowly.  Support your breast with 4 fingers underneath and your thumb above your nipple. Make sure your fingers are well away from your nipple and your baby's mouth.  Stroke your baby's lips gently with your finger or nipple.  When your baby's mouth is open wide enough, quickly bring your baby to your breast, placing your entire nipple and as much of the colored area around your nipple (areola) as possible into your baby's mouth. ? More areola should be visible above your baby's upper lip than below the lower lip. ? Your baby's tongue should be between his or her lower gum and your breast.  Ensure that your baby's mouth is correctly positioned around your nipple (latched). Your baby's lips should create a seal on your breast and be turned out (everted).  It is common for your baby to suck about 2-3 minutes in order to start the flow of breast milk.  Latching Teaching your baby how to latch on to your breast properly is very important. An improper latch can cause nipple pain and decreased milk supply for you and poor weight gain in your baby. Also, if your baby is not latched onto your nipple properly, he or she may swallow some air during feeding. This can make your baby fussy. Burping your baby when you switch breasts during the feeding can help to get rid of the air. However, teaching your baby to latch on properly is  still the best way to prevent fussiness from swallowing air while breastfeeding. Signs that your baby has successfully latched on to your nipple:  Silent tugging or silent sucking, without causing you pain.  Swallowing heard between every 3-4 sucks.  Muscle movement above and in front of his or her   ears while sucking.  Signs that your baby has not successfully latched on to nipple:  Sucking sounds or smacking sounds from your baby while breastfeeding.  Nipple pain.  If you think your baby has not latched on correctly, slip your finger into the corner of your baby's mouth to break the suction and place it between your baby's gums. Attempt breastfeeding initiation again. Signs of Successful Breastfeeding Signs from your baby:  A gradual decrease in the number of sucks or complete cessation of sucking.  Falling asleep.  Relaxation of his or her body.  Retention of a small amount of milk in his or her mouth.  Letting go of your breast by himself or herself.  Signs from you:  Breasts that have increased in firmness, weight, and size 1-3 hours after feeding.  Breasts that are softer immediately after breastfeeding.  Increased milk volume, as well as a change in milk consistency and color by the fifth day of breastfeeding.  Nipples that are not sore, cracked, or bleeding.  Signs That Your Baby is Getting Enough Milk  Wetting at least 1-2 diapers during the first 24 hours after birth.  Wetting at least 5-6 diapers every 24 hours for the first week after birth. The urine should be clear or pale yellow by 5 days after birth.  Wetting 6-8 diapers every 24 hours as your baby continues to grow and develop.  At least 3 stools in a 24-hour period by age 5 days. The stool should be soft and yellow.  At least 3 stools in a 24-hour period by age 7 days. The stool should be seedy and yellow.  No loss of weight greater than 10% of birth weight during the first 3 days of age.  Average  weight gain of 4-7 ounces (113-198 g) per week after age 4 days.  Consistent daily weight gain by age 5 days, without weight loss after the age of 2 weeks.  After a feeding, your baby may spit up a small amount. This is common. Breastfeeding frequency and duration Frequent feeding will help you make more milk and can prevent sore nipples and breast engorgement. Breastfeed when you feel the need to reduce the fullness of your breasts or when your baby shows signs of hunger. This is called "breastfeeding on demand." Avoid introducing a pacifier to your baby while you are working to establish breastfeeding (the first 4-6 weeks after your baby is born). After this time you may choose to use a pacifier. Research has shown that pacifier use during the first year of a baby's life decreases the risk of sudden infant death syndrome (SIDS). Allow your baby to feed on each breast as long as he or she wants. Breastfeed until your baby is finished feeding. When your baby unlatches or falls asleep while feeding from the first breast, offer the second breast. Because newborns are often sleepy in the first few weeks of life, you may need to awaken your baby to get him or her to feed. Breastfeeding times will vary from baby to baby. However, the following rules can serve as a guide to help you ensure that your baby is properly fed:  Newborns (babies 4 weeks of age or younger) may breastfeed every 1-3 hours.  Newborns should not go longer than 3 hours during the day or 5 hours during the night without breastfeeding.  You should breastfeed your baby a minimum of 8 times in a 24-hour period until you begin to introduce solid foods to your   baby at around 6 months of age.  Breast milk pumping Pumping and storing breast milk allows you to ensure that your baby is exclusively fed your breast milk, even at times when you are unable to breastfeed. This is especially important if you are going back to work while you are still  breastfeeding or when you are not able to be present during feedings. Your lactation consultant can give you guidelines on how long it is safe to store breast milk. A breast pump is a machine that allows you to pump milk from your breast into a sterile bottle. The pumped breast milk can then be stored in a refrigerator or freezer. Some breast pumps are operated by hand, while others use electricity. Ask your lactation consultant which type will work best for you. Breast pumps can be purchased, but some hospitals and breastfeeding support groups lease breast pumps on a monthly basis. A lactation consultant can teach you how to hand express breast milk, if you prefer not to use a pump. Caring for your breasts while you breastfeed Nipples can become dry, cracked, and sore while breastfeeding. The following recommendations can help keep your breasts moisturized and healthy:  Avoid using soap on your nipples.  Wear a supportive bra. Although not required, special nursing bras and tank tops are designed to allow access to your breasts for breastfeeding without taking off your entire bra or top. Avoid wearing underwire-style bras or extremely tight bras.  Air dry your nipples for 3-4minutes after each feeding.  Use only cotton bra pads to absorb leaked breast milk. Leaking of breast milk between feedings is normal.  Use lanolin on your nipples after breastfeeding. Lanolin helps to maintain your skin's normal moisture barrier. If you use pure lanolin, you do not need to wash it off before feeding your baby again. Pure lanolin is not toxic to your baby. You may also hand express a few drops of breast milk and gently massage that milk into your nipples and allow the milk to air dry.  In the first few weeks after giving birth, some women experience extremely full breasts (engorgement). Engorgement can make your breasts feel heavy, warm, and tender to the touch. Engorgement peaks within 3-5 days after you give  birth. The following recommendations can help ease engorgement:  Completely empty your breasts while breastfeeding or pumping. You may want to start by applying warm, moist heat (in the shower or with warm water-soaked hand towels) just before feeding or pumping. This increases circulation and helps the milk flow. If your baby does not completely empty your breasts while breastfeeding, pump any extra milk after he or she is finished.  Wear a snug bra (nursing or regular) or tank top for 1-2 days to signal your body to slightly decrease milk production.  Apply ice packs to your breasts, unless this is too uncomfortable for you.  Make sure that your baby is latched on and positioned properly while breastfeeding.  If engorgement persists after 48 hours of following these recommendations, contact your health care provider or a lactation consultant. Overall health care recommendations while breastfeeding  Eat healthy foods. Alternate between meals and snacks, eating 3 of each per day. Because what you eat affects your breast milk, some of the foods may make your baby more irritable than usual. Avoid eating these foods if you are sure that they are negatively affecting your baby.  Drink milk, fruit juice, and water to satisfy your thirst (about 10 glasses a day).    Rest often, relax, and continue to take your prenatal vitamins to prevent fatigue, stress, and anemia.  Continue breast self-awareness checks.  Avoid chewing and smoking tobacco. Chemicals from cigarettes that pass into breast milk and exposure to secondhand smoke may harm your baby.  Avoid alcohol and drug use, including marijuana. Some medicines that may be harmful to your baby can pass through breast milk. It is important to ask your health care provider before taking any medicine, including all over-the-counter and prescription medicine as well as vitamin and herbal supplements. It is possible to become pregnant while breastfeeding.  If birth control is desired, ask your health care provider about options that will be safe for your baby. Contact a health care provider if:  You feel like you want to stop breastfeeding or have become frustrated with breastfeeding.  You have painful breasts or nipples.  Your nipples are cracked or bleeding.  Your breasts are red, tender, or warm.  You have a swollen area on either breast.  You have a fever or chills.  You have nausea or vomiting.  You have drainage other than breast milk from your nipples.  Your breasts do not become full before feedings by the fifth day after you give birth.  You feel sad and depressed.  Your baby is too sleepy to eat well.  Your baby is having trouble sleeping.  Your baby is wetting less than 3 diapers in a 24-hour period.  Your baby has less than 3 stools in a 24-hour period.  Your baby's skin or the white part of his or her eyes becomes yellow.  Your baby is not gaining weight by 5 days of age. Get help right away if:  Your baby is overly tired (lethargic) and does not want to wake up and feed.  Your baby develops an unexplained fever. This information is not intended to replace advice given to you by your health care provider. Make sure you discuss any questions you have with your health care provider. Document Released: 11/13/2005 Document Revised: 04/26/2016 Document Reviewed: 05/07/2013 [employer] Interactive Patient Education  2017 [employer]  

## 2017-07-10 NOTE — Progress Notes (Signed)
   PRENATAL VISIT NOTE  Subjective:  Jody Perez is a 26 y.o. [id number] at 6136w5d being seen today for ongoing prenatal care.  She is currently monitored for the following issues for this low-risk pregnancy and has History of genital HSV; Supervision of other normal pregnancy, antepartum; and Short interval between pregnancies affecting pregnancy, antepartum on her problem list.  Patient reports no complaints.  Contractions: Not present. Vag. Bleeding: None.  Movement: Present. Denies leaking of fluid.   The following portions of the patient's history were reviewed and updated as appropriate: allergies, current medications, past family history, past medical history, past social history, past surgical history and problem list. Problem list updated.  Objective:   Vitals:   07/10/17 1355  BP: 119/66  Pulse: 73  Weight: 163 lb (73.9 kg)    Fetal Status: Fetal Heart Rate (bpm): 144 Fundal Height: 20 cm Movement: Present     General:  Alert, oriented and cooperative. Patient is in no acute distress.  Skin: Skin is warm and dry. No rash noted.   Cardiovascular: Normal heart rate noted  Respiratory: Normal respiratory effort, no problems with respiration noted  Abdomen: Soft, gravid, appropriate for gestational age.  Pain/Pressure: Present     Pelvic: Cervical exam deferred        Extremities: Normal range of motion.  Edema: None  Mental Status:  Normal mood and affect. Normal behavior. Normal judgment and thought content.   Assessment and Plan:  Pregnancy: [id number] at 4636w5d  1. Supervision of other normal pregnancy, antepartum To resume babyscripts.  AFP today - AFP, Serum, Open Spina Bifida  General obstetric precautions including but not limited to vaginal bleeding, contractions, leaking of fluid and fetal movement were reviewed in detail with the patient. Please refer to After Visit Summary for other counseling recommendations.  Return in 8 weeks (on 09/04/2017) for ob  visit.   Reva Boresanya S Tyce Delcid, MD

## 2017-07-13 LAB — AFP, SERUM, OPEN SPINA BIFIDA
AFP MoM: 0.74
AFP Value: 48.1 ng/mL
GEST. AGE ON COLLECTION DATE: 20.7 wk
Maternal Age At EDD: 25.7 yr
OSBR RISK 1 IN: 10000
TEST RESULTS AFP: NEGATIVE
Weight: 160 [lb_av]

## 2017-07-24 ENCOUNTER — Inpatient Hospital Stay
Admission: EM | Admit: 2017-07-24 | Discharge: 2017-07-25 | Disposition: A | Discharge status: Home / Self Care | Payer: Medicaid Other | Attending: Obstetrics and Gynecology | Admitting: Obstetrics and Gynecology

## 2017-07-24 DIAGNOSIS — Z3A23 23 weeks gestation of pregnancy: Secondary | ICD-10-CM | POA: Diagnosis not present

## 2017-07-24 DIAGNOSIS — M545 Low back pain: Secondary | ICD-10-CM | POA: Insufficient documentation

## 2017-07-24 DIAGNOSIS — R109 Unspecified abdominal pain: Secondary | ICD-10-CM | POA: Insufficient documentation

## 2017-07-24 DIAGNOSIS — O09899 Supervision of other high risk pregnancies, unspecified trimester: Secondary | ICD-10-CM

## 2017-07-24 DIAGNOSIS — Z3480 Encounter for supervision of other normal pregnancy, unspecified trimester: Secondary | ICD-10-CM

## 2017-07-24 DIAGNOSIS — O26892 Other specified pregnancy related conditions, second trimester: Secondary | ICD-10-CM | POA: Diagnosis present

## 2017-07-24 LAB — URINALYSIS, ROUTINE W REFLEX MICROSCOPIC
Bilirubin Urine: NEGATIVE
Glucose, UA: NEGATIVE mg/dL
HGB URINE DIPSTICK: NEGATIVE
Ketones, ur: NEGATIVE mg/dL
NITRITE: NEGATIVE
PROTEIN: NEGATIVE mg/dL
Specific Gravity, Urine: 1.015 (ref 1.005–1.030)
pH: 7 (ref 5.0–8.0)

## 2017-07-24 MED ORDER — ZOLPIDEM TARTRATE 5 MG PO TABS: 5.0000 mg | ORAL_TABLET | Freq: Every evening | ORAL | Status: DC | PRN

## 2017-07-24 MED ORDER — DOCUSATE SODIUM 100 MG PO CAPS: 100.0000 mg | ORAL_CAPSULE | Freq: Every day | ORAL | Status: DC

## 2017-07-24 MED ORDER — CALCIUM CARBONATE ANTACID 500 MG PO CHEW: 2.0000 | CHEWABLE_TABLET | ORAL | Status: DC | PRN

## 2017-07-24 MED ORDER — ACETAMINOPHEN 325 MG PO TABS: 650.0000 mg | ORAL_TABLET | ORAL | Status: DC | PRN

## 2017-07-24 MED ORDER — PRENATAL MULTIVITAMIN CH: 1.0000 | ORAL_TABLET | Freq: Every day | ORAL | Status: DC

## 2017-07-24 NOTE — OB Triage Note (Signed)
Patient arrived in triage with complaints of intermittent low back pain the radiated to front of abdomen x approx 2 hours, describing as "sharp, shooting". States pain is gone at present time. Denies leaking of fluid and vaginal bleeding. States fetal movement is slightly decreased from normal. Discussed plan of care. Patient verbalized understanding.

## 2017-07-24 NOTE — OB Triage Provider Note (Signed)
TRIAGE NOTE to rule out Preterm Labor   History of Present Illness: Jody Perez is a 26 y.o. [id number] at [redacted]w[redacted]d presenting to triage for preterm pain. She describes sharp shooting pain in lower back for two hours, now resolved on triage admit. No dysuria, blood in urine or stool. No ctx or LOF.  She has had no water to drink today, only tea.   - short interpregnancy interval    Patient Active Problem List   Diagnosis Date Noted  . Supervision of other normal pregnancy, antepartum 05/07/2017  . Short interval between pregnancies affecting pregnancy, antepartum 05/07/2017  . History of genital HSV 01/28/2013    Past Medical History:  Diagnosis Date  . BV (bacterial vaginosis)   . Chlamydia   . Herpes genitalis in women   . HSV infection   . Miscarriage   . Trichomonas   . UTI (lower urinary tract infection)     Past Surgical History:  Procedure Laterality Date  . NO PAST SURGERIES      OB History  Gravida Para Term Preterm AB Living  4 2 2  0 1 2  SAB TAB Ectopic Multiple Live Births  1 0 0 0 2    # Outcome Date GA Lbr Len/2nd Weight Sex Delivery Anes PTL Lv  4 Current           3 Term 12/07/16 [redacted]w[redacted]d 16:36 / 01:24 7 lb 11.6 oz (3.505 kg) F Vag-Spont EPI  LIV  2 Term 08/18/13 [redacted]w[redacted]d 18:30 / 04:10 6 lb 2.1 oz (2.78 kg) F Vag-Spont EPI  LIV     Birth Comments: Delivery complications:       Spontaneous labor at 39 2/7 weeks. Intrapartum fever. Mom considered to have chorioamnionitis. Given antibiotics (ampicillin and gentamicin). SVD after prolonged pushing.   HEPATIC:    Mother's blood type is O positive, infant is A positive with positive Coombs.  The baby had a rapid initial rise in serum bilirubin for which she was treated aggressively with triple phototherapy and IV hydration.Treatment for hyperbilirubinemia included IVIG on the first day of life and phototherapy days 1-4. Serum bilirubin levels were monitored frequently, peaking at 10.6 on day 5. Last bilirubin prior to  discharge was 10.6 on 08/22/13, and baby had been off phototherapy for 24 hours. Infant will return to the hospital for an outpatient bilirubin check on 08/23/13.   HEME:   Hematocrit on admission was 54.9 with reticulocyte count 8.1%. Repeat Hematocrit on 08/23/13 was 34.4. Baby is without symptoms of anemia. Her mother was counseled about risk of late anemia due to continued low-grade hemolysis. The baby received 1 dose of IVIG on DOL 1 with the intention of reducing this risk.  Infant should be followed clinically for signs of anemia.   INFECTION:    Infection risks at delivery included maternal GBS and clinical chorioamnionitis.  The admission CBC was benign but the procalcitonin (bio-maker for infection) was elevated.  Infant received 3 days of IV antibiotics for possible sepsis by which time the procalcitonin had normalized and infant was clinically stable. Her blood culture was negative. The placenta was not sent for pathology examination.  1 SAB 2013             Birth Comments: System Generated. Please review and update pregnancy details.      Social History   Social History  . Marital status: Single    Spouse name: N/A  . Number of children: N/A  . Years of education: N/A  Social History Main Topics  . Smoking status: Never Smoker  . Smokeless tobacco: Never Used  . Alcohol use No     Comment: socially  . Drug use: No  . Sexual activity: Yes    Birth control/ protection: None   Other Topics Concern  . None   Social History Narrative  . None    Family History  Problem Relation Age of Onset  . Hypertension Mother   . Hypertension Brother     No Known Allergies  No prescriptions prior to admission.    Review of Systems - See HPI for OB specific ROS.   Vitals:  BP 123/77 (BP Location: Right Arm)   Pulse 97   Temp 98.3 F (36.8 C) (Oral)   Resp 16   Ht 5\' 4"  (1.626 m)   Wt 163 lb (73.9 kg)   LMP 02/15/2017 (Exact Date)   BMI 27.98 kg/m  Physical  Examination: CONSTITUTIONAL: Well-developed, well-nourished female in no acute distress.  HENT:  Normocephalic, atraumatic EYES: Conjunctivae and EOM are normal. No scleral icterus.  NECK: Normal range of motion, supple, SKIN: Skin is warm and dry. No rash noted. Not diaphoretic. No erythema. No pallor. NEUROLGIC: Alert and oriented to person, place, and time. No gross cranial nerve deficit noted. PSYCHIATRIC: Normal mood and affect. Normal behavior. Normal judgment and thought content. ABDOMEN: Soft, nontender, nondistended, gravid.  Cervix: deferred Membranes: intact Fetal Monitoring: FHT at 145, appropriate for gestational age Tocometer: Flat  Labs:  Urine dipstick shows trace for leukocytes.  Micro exam: rare+ bacteria.   Assessment and Plan: Patient Active Problem List   Diagnosis Date Noted  . Supervision of other normal pregnancy, antepartum 05/07/2017  . Short interval between pregnancies affecting pregnancy, antepartum 05/07/2017  . History of genital HSV 01/28/2013    1. Pain now resolved, no evidence of kidney disfunction, UTI, uterine infection. No concern for LOF or preterm labor. Recommend f/u with OB as scheduled. Recommend aggressive po hydration at home with water. Call OB if pain returns or vaginal discomfort develops, with hx of vaginitis in past. 2. FHT 145, previable fetal activity noted on the monitor.   Cline Cools, MD, MPH

## 2017-07-24 NOTE — OB Triage Note (Signed)
Discharge instructions given and reviewed.  All questions addressed.

## 2017-08-07 ENCOUNTER — Encounter: Payer: Self-pay | Admitting: Emergency Medicine

## 2017-08-07 ENCOUNTER — Emergency Department
Admission: EM | Admit: 2017-08-07 | Discharge: 2017-08-07 | Disposition: A | Discharge status: Home / Self Care | Payer: Medicaid Other | Attending: Emergency Medicine | Admitting: Emergency Medicine

## 2017-08-07 DIAGNOSIS — Z79899 Other long term (current) drug therapy: Secondary | ICD-10-CM | POA: Diagnosis not present

## 2017-08-07 DIAGNOSIS — G8929 Other chronic pain: Secondary | ICD-10-CM | POA: Insufficient documentation

## 2017-08-07 DIAGNOSIS — M5441 Lumbago with sciatica, right side: Secondary | ICD-10-CM | POA: Insufficient documentation

## 2017-08-07 DIAGNOSIS — M7631 Iliotibial band syndrome, right leg: Secondary | ICD-10-CM

## 2017-08-07 DIAGNOSIS — Z3A26 26 weeks gestation of pregnancy: Secondary | ICD-10-CM | POA: Diagnosis not present

## 2017-08-07 DIAGNOSIS — M79604 Pain in right leg: Secondary | ICD-10-CM | POA: Diagnosis present

## 2017-08-07 DIAGNOSIS — O9989 Other specified diseases and conditions complicating pregnancy, childbirth and the puerperium: Secondary | ICD-10-CM | POA: Insufficient documentation

## 2017-08-07 NOTE — ED Triage Notes (Signed)
Right leg pain/weakness x9 months , leg gives out occasionally, pain/ burning sensation radiating from lower back down to right leg, increased pain with ambulation

## 2017-08-07 NOTE — ED Provider Notes (Signed)
[facility] Emergency Department Provider Note  ____________________________________________  Time seen: Approximately 5:26 PM  I have reviewed the triage vital signs and the nursing notes.   HISTORY  Chief Complaint Leg Pain    HPI Jody Perez is a 26 y.o. female who complains of right leg pain and paresthesia for the past 9 months. Symptoms are intermittent, worse with standing and walking. Causes her leg to occasionally "give out" but without serious fall or injury. The symptoms consist of pain in the right lower back that is burning and sharp and radiated down the right leg in the posterior and lateral aspects. Denies any prior injuries, but notes that the symptoms seem to start after she delivered her last child 9 months ago. Symptoms have been worsening over the past few months in the setting of her current pregnancy. She is about [redacted] weeks pregnant and following up with [facility].denies any vaginal bleeding leakage of fluid cramps contractions or decreased fetal movements.  Patient also  requests a letter asserting her need for accommodations in her housing unit to be placed on a level floor and not have to go of any steps. I advised her she'll need to see her primary care doctor for this.  no bowel or bladder incontinence or retention. Symptoms are intermittent with periods of completely normal function in between   Past Medical History:  Diagnosis Date  . BV (bacterial vaginosis)   . Chlamydia   . Herpes genitalis in women   . HSV infection   . Miscarriage   . Trichomonas   . UTI (lower urinary tract infection)      Patient Active Problem List   Diagnosis Date Noted  . Supervision of other normal pregnancy, antepartum 05/07/2017  . Short interval between pregnancies affecting pregnancy, antepartum 05/07/2017  . History of genital HSV 01/28/2013     Past Surgical History:  Procedure Laterality Date  . NO PAST SURGERIES        Prior to Admission medications   Medication Sig Start Date End Date Taking? Authorizing Provider  acetaminophen (TYLENOL) 500 MG tablet Take 500 mg by mouth every 6 (six) hours as needed.    [provider]  ondansetron (ZOFRAN ODT) 8 MG disintegrating tablet Take 1 tablet (8 mg total) by mouth every 8 (eight) hours as needed for nausea or vomiting. 06/15/17   Armando ReichertHogan, Heather D, CNM  Prenatal Vit-Fe Fumarate-FA (MULTIVITAMIN-PRENATAL) 27-0.8 MG TABS tablet Take 1 tablet by mouth daily at 12 noon.    [provider]     Allergies Patient has no known allergies.   Family History  Problem Relation Age of Onset  . Hypertension Mother   . Hypertension Brother     Social History Social History  Substance Use Topics  . Smoking status: Never Smoker  . Smokeless tobacco: Never Used  . Alcohol use No     Comment: socially    Review of Systems  Constitutional:   No fever or chills.  ENT:   No sore throat. No rhinorrhea. Cardiovascular:   No chest pain or syncope. Respiratory:   No dyspnea or cough. Gastrointestinal:   Negative for abdominal pain, vomiting and diarrhea.  Musculoskeletal:   lower back pain and right leg pain as above. All other systems reviewed and are negative except as documented above in ROS and HPI.  ____________________________________________   PHYSICAL EXAM:  VITAL SIGNS: ED Triage Vitals  Enc Vitals Group     BP 08/07/17 1552 125/62  Pulse Rate 08/07/17 1552 94     Resp 08/07/17 1552 18     Temp 08/07/17 1552 98.4 F (36.9 C)     Temp Source 08/07/17 1552 Oral     SpO2 08/07/17 1552 100 %     Weight 08/07/17 1553 163 lb (73.9 kg)     Height 08/07/17 1553  (1.626 m)     Head Circumference --      Peak Flow --      Pain Score 08/07/17 1552 8     Pain Loc --      Pain Edu? --      Excl. in GC? --     Vital signs reviewed, nursing assessments reviewed.   Constitutional:   Alert and oriented. Well appearing and in  no distress. Eyes:   No scleral icterus.  EOMI. No nystagmus. No conjunctival pallor. PERRL. ENT   Head:   Normocephalic and atraumatic.   Nose:   No congestion/rhinnorhea.    Mouth/Throat:   MMM, no pharyngeal erythema. No peritonsillar mass.    Neck:   No meningismus. Full ROM Hematological/Lymphatic/Immunilogical:   No cervical lymphadenopathy. Cardiovascular:   RRR. Symmetric bilateral radial and DP pulses.  No murmurs.  Respiratory:   Normal respiratory effort without tachypnea/retractions. Breath sounds are clear and equal bilaterally. No wheezes/rales/rhonchi. Gastrointestinal:   Soft and nontender. Non distended. There is no CVA tenderness.  No rebound, rigidity, or guarding. Genitourinary:   deferred Musculoskeletal:   Normal range of motion in all extremities. No joint effusions. there is tightness and mild tenderness along the right IT band. Also in the right lower back. This is somewhat aggravated by stretching maneuvers. No posterior leg tenderness, no popliteal or calf tenderness. Symmetric Circumference. Negative Homans sign. Neurologic:   Normal speech and language.  Motor grossly intact. No gross focal neurologic deficits are appreciated.  Skin:    Skin is warm, dry and intact. No rash noted.  No petechiae, purpura, or bullae.  ____________________________________________    LABS (pertinent positives/negatives) (all labs ordered are listed, but only abnormal results are displayed) Labs Reviewed - No data to display ____________________________________________   EKG    ____________________________________________    RADIOLOGY  No results found.  ____________________________________________   PROCEDURES Procedures  ____________________________________________   INITIAL IMPRESSION / ASSESSMENT AND PLAN / ED COURSE  Pertinent labs & imaging results that were available during my care of the patient were reviewed by me and considered in my  medical decision making (see chart for details).  patient well appearing no acute distress, normal vital signs, reassuring exam. Low suspicion for DVT soft tissue infection fracture or dislocation. No evidence of cauda equina or epidural abscess or hematoma.exam is highly consistent with muscle strain and spasm, likely exacerbated by her developing pregnancy and the additional weight and stress placed on her lower back. Advised heat therapy, Tylenol, gentle activity and light stretching. Follow up with her primary care doctor and obstetrics. Neurologically intact at this time.      ____________________________________________   FINAL CLINICAL IMPRESSION(S) / ED DIAGNOSES  Final diagnoses:  It band syndrome, right  Chronic right-sided low back pain with right-sided sciatica      New Prescriptions   No medications on file     Portions of this note were generated with dragon dictation software. Dictation errors may occur despite best attempts at proofreading.    Sharman Cheek, MD 08/07/17 [phone]

## 2017-08-10 ENCOUNTER — Encounter: Payer: Self-pay | Admitting: Radiology

## 2017-08-10 ENCOUNTER — Ambulatory Visit (INDEPENDENT_AMBULATORY_CARE_PROVIDER_SITE_OTHER): Payer: Medicaid Other | Admitting: Family Medicine

## 2017-08-10 VITALS — BP 121/70 | HR 104 | Wt 168.0 lb

## 2017-08-10 DIAGNOSIS — Z3482 Encounter for supervision of other normal pregnancy, second trimester: Secondary | ICD-10-CM

## 2017-08-10 DIAGNOSIS — Z3480 Encounter for supervision of other normal pregnancy, unspecified trimester: Secondary | ICD-10-CM

## 2017-08-10 NOTE — Patient Instructions (Addendum)
 Second Trimester of Pregnancy The second trimester is from week 14 through week 27 (months 4 through 6). The second trimester is often a time when you feel your best. Your body has adjusted to being pregnant, and you begin to feel better physically. Usually, morning sickness has lessened or quit completely, you may have more energy, and you may have an increase in appetite. The second trimester is also a time when the fetus is growing rapidly. At the end of the sixth month, the fetus is about 9 inches long and weighs about 1 pounds. You will likely begin to feel the baby move (quickening) between 16 and 20 weeks of pregnancy. Body changes during your second trimester Your body continues to go through many changes during your second trimester. The changes vary from woman to woman.  Your weight will continue to increase. You will notice your lower abdomen bulging out.  You may begin to get stretch marks on your hips, abdomen, and breasts.  You may develop headaches that can be relieved by medicines. The medicines should be approved by your health care provider.  You may urinate more often because the fetus is pressing on your bladder.  You may develop or continue to have heartburn as a result of your pregnancy.  You may develop constipation because certain hormones are causing the muscles that push waste through your intestines to slow down.  You may develop hemorrhoids or swollen, bulging veins (varicose veins).  You may have back pain. This is caused by: ? Weight gain. ? Pregnancy hormones that are relaxing the joints in your pelvis. ? A shift in weight and the muscles that support your balance.  Your breasts will continue to grow and they will continue to become tender.  Your gums may bleed and may be sensitive to brushing and flossing.  Dark spots or blotches (chloasma, mask of pregnancy) may develop on your face. This will likely fade after the baby is born.  A dark line from  your belly button to the pubic area (linea nigra) may appear. This will likely fade after the baby is born.  You may have changes in your hair. These can include thickening of your hair, rapid growth, and changes in texture. Some women also have hair loss during or after pregnancy, or hair that feels dry or thin. Your hair will most likely return to normal after your baby is born.  What to expect at prenatal visits During a routine prenatal visit:  You will be weighed to make sure you and the fetus are growing normally.  Your blood pressure will be taken.  Your abdomen will be measured to track your baby's growth.  The fetal heartbeat will be listened to.  Any test results from the previous visit will be discussed.  Your health care provider may ask you:  How you are feeling.  If you are feeling the baby move.  If you have had any abnormal symptoms, such as leaking fluid, bleeding, severe headaches, or abdominal cramping.  If you are using any tobacco products, including cigarettes, chewing tobacco, and electronic cigarettes.  If you have any questions.  Other tests that may be performed during your second trimester include:  Blood tests that check for: ? Low iron levels (anemia). ? High blood sugar that affects pregnant women (gestational diabetes) between 24 and 28 weeks. ? Rh antibodies. This is to check for a protein on red blood cells (Rh factor).  Urine tests to check for infections, diabetes,   or protein in the urine.  An ultrasound to confirm the proper growth and development of the baby.  An amniocentesis to check for possible genetic problems.  Fetal screens for spina bifida and Down syndrome.  HIV (human immunodeficiency virus) testing. Routine prenatal testing includes screening for HIV, unless you choose not to have this test.  Follow these instructions at home: Medicines  Follow your health care provider's instructions regarding medicine use. Specific  medicines may be either safe or unsafe to take during pregnancy.  Take a prenatal vitamin that contains at least 600 micrograms (mcg) of folic acid.  If you develop constipation, try taking a stool softener if your health care provider approves. Eating and drinking  Eat a balanced diet that includes fresh fruits and vegetables, whole grains, good sources of protein such as meat, eggs, or tofu, and low-fat dairy. Your health care provider will help you determine the amount of weight gain that is right for you.  Avoid raw meat and uncooked cheese. These carry germs that can cause birth defects in the baby.  If you have low calcium intake from food, talk to your health care provider about whether you should take a daily calcium supplement.  Limit foods that are high in fat and processed sugars, such as fried and sweet foods.  To prevent constipation: ? Drink enough fluid to keep your urine clear or pale yellow. ? Eat foods that are high in fiber, such as fresh fruits and vegetables, whole grains, and beans. Activity  Exercise only as directed by your health care provider. Most women can continue their usual exercise routine during pregnancy. Try to exercise for 30 minutes at least 5 days a week. Stop exercising if you experience uterine contractions.  Avoid heavy lifting, wear low heel shoes, and practice good posture.  A sexual relationship may be continued unless your health care provider directs you otherwise. Relieving pain and discomfort  Wear a good support bra to prevent discomfort from breast tenderness.  Take warm sitz baths to soothe any pain or discomfort caused by hemorrhoids. Use hemorrhoid cream if your health care provider approves.  Rest with your legs elevated if you have leg cramps or low back pain.  If you develop varicose veins, wear support hose. Elevate your feet for 15 minutes, 3-4 times a day. Limit salt in your diet. Prenatal Care  Write down your questions.  Take them to your prenatal visits.  Keep all your prenatal visits as told by your health care provider. This is important. Safety  Wear your seat belt at all times when driving.  Make a list of emergency phone numbers, including numbers for family, friends, the hospital, and police and fire departments. General instructions  Ask your health care provider for a referral to a local prenatal education class. Begin classes no later than the beginning of month 6 of your pregnancy.  Ask for help if you have counseling or nutritional needs during pregnancy. Your health care provider can offer advice or refer you to specialists for help with various needs.  Do not use hot tubs, steam rooms, or saunas.  Do not douche or use tampons or scented sanitary pads.  Do not cross your legs for long periods of time.  Avoid cat litter boxes and soil used by cats. These carry germs that can cause birth defects in the baby and possibly loss of the fetus by miscarriage or stillbirth.  Avoid all smoking, herbs, alcohol, and unprescribed drugs. Chemicals in these products   can affect the formation and growth of the baby.  Do not use any products that contain nicotine or tobacco, such as cigarettes and e-cigarettes. If you need help quitting, ask your health care provider.  Visit your dentist if you have not gone yet during your pregnancy. Use a soft toothbrush to brush your teeth and be gentle when you floss. Contact a health care provider if:  You have dizziness.  You have mild pelvic cramps, pelvic pressure, or nagging pain in the abdominal area.  You have persistent nausea, vomiting, or diarrhea.  You have a bad smelling vaginal discharge.  You have pain when you urinate. Get help right away if:  You have a fever.  You are leaking fluid from your vagina.  You have spotting or bleeding from your vagina.  You have severe abdominal cramping or pain.  You have rapid weight gain or weight  loss.  You have shortness of breath with chest pain.  You notice sudden or extreme swelling of your face, hands, ankles, feet, or legs.  You have not felt your baby move in over an hour.  You have severe headaches that do not go away when you take medicine.  You have vision changes. Summary  The second trimester is from week 14 through week 27 (months 4 through 6). It is also a time when the fetus is growing rapidly.  Your body goes through many changes during pregnancy. The changes vary from woman to woman.  Avoid all smoking, herbs, alcohol, and unprescribed drugs. These chemicals affect the formation and growth your baby.  Do not use any tobacco products, such as cigarettes, chewing tobacco, and e-cigarettes. If you need help quitting, ask your health care provider.  Contact your health care provider if you have any questions. Keep all prenatal visits as told by your health care provider. This is important. This information is not intended to replace advice given to you by your health care provider. Make sure you discuss any questions you have with your health care provider. Document Released: 11/07/2001 Document Revised: 04/20/2016 Document Reviewed: 01/14/2013 [employer] Interactive Patient Education  2017 [employer]   Breastfeeding Deciding to breastfeed is one of the best choices you can make for you and your baby. A change in hormones during pregnancy causes your breast tissue to grow and increases the number and size of your milk ducts. These hormones also allow proteins, sugars, and fats from your blood supply to make breast milk in your milk-producing glands. Hormones prevent breast milk from being released before your baby is born as well as prompt milk flow after birth. Once breastfeeding has begun, thoughts of your baby, as well as his or her sucking or crying, can stimulate the release of milk from your milk-producing glands. Benefits of breastfeeding For Your  Baby  Your first milk (colostrum) helps your baby's digestive system function better.  There are antibodies in your milk that help your baby fight off infections.  Your baby has a lower incidence of asthma, allergies, and sudden infant death syndrome.  The nutrients in breast milk are better for your baby than infant formulas and are designed uniquely for your baby's needs.  Breast milk improves your baby's brain development.  Your baby is less likely to develop other conditions, such as childhood obesity, asthma, or type 2 diabetes mellitus.  For You  Breastfeeding helps to create a very special bond between you and your baby.  Breastfeeding is convenient. Breast milk is always available at   the correct temperature and costs nothing.  Breastfeeding helps to burn calories and helps you lose the weight gained during pregnancy.  Breastfeeding makes your uterus contract to its prepregnancy size faster and slows bleeding (lochia) after you give birth.  Breastfeeding helps to lower your risk of developing type 2 diabetes mellitus, osteoporosis, and breast or ovarian cancer later in life.  Signs that your baby is hungry Early Signs of Hunger  Increased alertness or activity.  Stretching.  Movement of the head from side to side.  Movement of the head and opening of the mouth when the corner of the mouth or cheek is stroked (rooting).  Increased sucking sounds, smacking lips, cooing, sighing, or squeaking.  Hand-to-mouth movements.  Increased sucking of fingers or hands.  Late Signs of Hunger  Fussing.  Intermittent crying.  Extreme Signs of Hunger Signs of extreme hunger will require calming and consoling before your baby will be able to breastfeed successfully. Do not wait for the following signs of extreme hunger to occur before you initiate breastfeeding:  Restlessness.  A loud, [place] cry.  Screaming.  Breastfeeding basics Breastfeeding Initiation  Find a  comfortable place to sit or lie down, with your neck and back well supported.  Place a pillow or rolled up blanket under your baby to bring him or her to the level of your breast (if you are seated). Nursing pillows are specially designed to help support your arms and your baby while you breastfeed.  Make sure that your baby's abdomen is facing your abdomen.  Gently massage your breast. With your fingertips, massage from your chest wall toward your nipple in a circular motion. This encourages milk flow. You may need to continue this action during the feeding if your milk flows slowly.  Support your breast with 4 fingers underneath and your thumb above your nipple. Make sure your fingers are well away from your nipple and your baby's mouth.  Stroke your baby's lips gently with your finger or nipple.  When your baby's mouth is open wide enough, quickly bring your baby to your breast, placing your entire nipple and as much of the colored area around your nipple (areola) as possible into your baby's mouth. ? More areola should be visible above your baby's upper lip than below the lower lip. ? Your baby's tongue should be between his or her lower gum and your breast.  Ensure that your baby's mouth is correctly positioned around your nipple (latched). Your baby's lips should create a seal on your breast and be turned out (everted).  It is common for your baby to suck about 2-3 minutes in order to start the flow of breast milk.  Latching Teaching your baby how to latch on to your breast properly is very important. An improper latch can cause nipple pain and decreased milk supply for you and poor weight gain in your baby. Also, if your baby is not latched onto your nipple properly, he or she may swallow some air during feeding. This can make your baby fussy. Burping your baby when you switch breasts during the feeding can help to get rid of the air. However, teaching your baby to latch on properly is  still the best way to prevent fussiness from swallowing air while breastfeeding. Signs that your baby has successfully latched on to your nipple:  Silent tugging or silent sucking, without causing you pain.  Swallowing heard between every 3-4 sucks.  Muscle movement above and in front of his or her   ears while sucking.  Signs that your baby has not successfully latched on to nipple:  Sucking sounds or smacking sounds from your baby while breastfeeding.  Nipple pain.  If you think your baby has not latched on correctly, slip your finger into the corner of your baby's mouth to break the suction and place it between your baby's gums. Attempt breastfeeding initiation again. Signs of Successful Breastfeeding Signs from your baby:  A gradual decrease in the number of sucks or complete cessation of sucking.  Falling asleep.  Relaxation of his or her body.  Retention of a small amount of milk in his or her mouth.  Letting go of your breast by himself or herself.  Signs from you:  Breasts that have increased in firmness, weight, and size 1-3 hours after feeding.  Breasts that are softer immediately after breastfeeding.  Increased milk volume, as well as a change in milk consistency and color by the fifth day of breastfeeding.  Nipples that are not sore, cracked, or bleeding.  Signs That Your Baby is Getting Enough Milk  Wetting at least 1-2 diapers during the first 24 hours after birth.  Wetting at least 5-6 diapers every 24 hours for the first week after birth. The urine should be clear or pale yellow by 5 days after birth.  Wetting 6-8 diapers every 24 hours as your baby continues to grow and develop.  At least 3 stools in a 24-hour period by age 5 days. The stool should be soft and yellow.  At least 3 stools in a 24-hour period by age 7 days. The stool should be seedy and yellow.  No loss of weight greater than 10% of birth weight during the first 3 days of age.  Average  weight gain of 4-7 ounces (113-198 g) per week after age 4 days.  Consistent daily weight gain by age 5 days, without weight loss after the age of 2 weeks.  After a feeding, your baby may spit up a small amount. This is common. Breastfeeding frequency and duration Frequent feeding will help you make more milk and can prevent sore nipples and breast engorgement. Breastfeed when you feel the need to reduce the fullness of your breasts or when your baby shows signs of hunger. This is called "breastfeeding on demand." Avoid introducing a pacifier to your baby while you are working to establish breastfeeding (the first 4-6 weeks after your baby is born). After this time you may choose to use a pacifier. Research has shown that pacifier use during the first year of a baby's life decreases the risk of sudden infant death syndrome (SIDS). Allow your baby to feed on each breast as long as he or she wants. Breastfeed until your baby is finished feeding. When your baby unlatches or falls asleep while feeding from the first breast, offer the second breast. Because newborns are often sleepy in the first few weeks of life, you may need to awaken your baby to get him or her to feed. Breastfeeding times will vary from baby to baby. However, the following rules can serve as a guide to help you ensure that your baby is properly fed:  Newborns (babies 4 weeks of age or younger) may breastfeed every 1-3 hours.  Newborns should not go longer than 3 hours during the day or 5 hours during the night without breastfeeding.  You should breastfeed your baby a minimum of 8 times in a 24-hour period until you begin to introduce solid foods to your   baby at around 6 months of age.  Breast milk pumping Pumping and storing breast milk allows you to ensure that your baby is exclusively fed your breast milk, even at times when you are unable to breastfeed. This is especially important if you are going back to work while you are still  breastfeeding or when you are not able to be present during feedings. Your lactation consultant can give you guidelines on how long it is safe to store breast milk. A breast pump is a machine that allows you to pump milk from your breast into a sterile bottle. The pumped breast milk can then be stored in a refrigerator or freezer. Some breast pumps are operated by hand, while others use electricity. Ask your lactation consultant which type will work best for you. Breast pumps can be purchased, but some hospitals and breastfeeding support groups lease breast pumps on a monthly basis. A lactation consultant can teach you how to hand express breast milk, if you prefer not to use a pump. Caring for your breasts while you breastfeed Nipples can become dry, cracked, and sore while breastfeeding. The following recommendations can help keep your breasts moisturized and healthy:  Avoid using soap on your nipples.  Wear a supportive bra. Although not required, special nursing bras and tank tops are designed to allow access to your breasts for breastfeeding without taking off your entire bra or top. Avoid wearing underwire-style bras or extremely tight bras.  Air dry your nipples for 3-4minutes after each feeding.  Use only cotton bra pads to absorb leaked breast milk. Leaking of breast milk between feedings is normal.  Use lanolin on your nipples after breastfeeding. Lanolin helps to maintain your skin's normal moisture barrier. If you use pure lanolin, you do not need to wash it off before feeding your baby again. Pure lanolin is not toxic to your baby. You may also hand express a few drops of breast milk and gently massage that milk into your nipples and allow the milk to air dry.  In the first few weeks after giving birth, some women experience extremely full breasts (engorgement). Engorgement can make your breasts feel heavy, warm, and tender to the touch. Engorgement peaks within 3-5 days after you give  birth. The following recommendations can help ease engorgement:  Completely empty your breasts while breastfeeding or pumping. You may want to start by applying warm, moist heat (in the shower or with warm water-soaked hand towels) just before feeding or pumping. This increases circulation and helps the milk flow. If your baby does not completely empty your breasts while breastfeeding, pump any extra milk after he or she is finished.  Wear a snug bra (nursing or regular) or tank top for 1-2 days to signal your body to slightly decrease milk production.  Apply ice packs to your breasts, unless this is too uncomfortable for you.  Make sure that your baby is latched on and positioned properly while breastfeeding.  If engorgement persists after 48 hours of following these recommendations, contact your health care provider or a lactation consultant. Overall health care recommendations while breastfeeding  Eat healthy foods. Alternate between meals and snacks, eating 3 of each per day. Because what you eat affects your breast milk, some of the foods may make your baby more irritable than usual. Avoid eating these foods if you are sure that they are negatively affecting your baby.  Drink milk, fruit juice, and water to satisfy your thirst (about 10 glasses a day).    Rest often, relax, and continue to take your prenatal vitamins to prevent fatigue, stress, and anemia.  Continue breast self-awareness checks.  Avoid chewing and smoking tobacco. Chemicals from cigarettes that pass into breast milk and exposure to secondhand smoke may harm your baby.  Avoid alcohol and drug use, including marijuana. Some medicines that may be harmful to your baby can pass through breast milk. It is important to ask your health care provider before taking any medicine, including all over-the-counter and prescription medicine as well as vitamin and herbal supplements. It is possible to become pregnant while breastfeeding.  If birth control is desired, ask your health care provider about options that will be safe for your baby. Contact a health care provider if:  You feel like you want to stop breastfeeding or have become frustrated with breastfeeding.  You have painful breasts or nipples.  Your nipples are cracked or bleeding.  Your breasts are red, tender, or warm.  You have a swollen area on either breast.  You have a fever or chills.  You have nausea or vomiting.  You have drainage other than breast milk from your nipples.  Your breasts do not become full before feedings by the fifth day after you give birth.  You feel sad and depressed.  Your baby is too sleepy to eat well.  Your baby is having trouble sleeping.  Your baby is wetting less than 3 diapers in a 24-hour period.  Your baby has less than 3 stools in a 24-hour period.  Your baby's skin or the white part of his or her eyes becomes yellow.  Your baby is not gaining weight by 5 days of age. Get help right away if:  Your baby is overly tired (lethargic) and does not want to wake up and feed.  Your baby develops an unexplained fever. This information is not intended to replace advice given to you by your health care provider. Make sure you discuss any questions you have with your health care provider. Document Released: 11/13/2005 Document Revised: 04/26/2016 Document Reviewed: 05/07/2013 [employer] Interactive Patient Education  2017 [employer]  

## 2017-08-10 NOTE — Progress Notes (Signed)
   PRENATAL VISIT NOTE  Subjective:  Jody Perez is a 26 y.o. [id number] at [redacted]w[redacted]d being seen today for ongoing prenatal care.  She is currently monitored for the following issues for this low-risk pregnancy and has History of genital HSV; Supervision of other normal pregnancy, antepartum; and Short interval between pregnancies affecting pregnancy, antepartum on her problem list.  Patient reports right leg pain and weakness. She is falling if goes up stairs..  Contractions: Not present. Vag. Bleeding: None.  Movement: Present. Denies leaking of fluid.   The following portions of the patient's history were reviewed and updated as appropriate: allergies, current medications, past family history, past medical history, past social history, past surgical history and problem list. Problem list updated.  Objective:   Vitals:   08/10/17 0913  BP: 121/70  Pulse: (!) 104  Weight: 168 lb (76.2 kg)    Fetal Status: Fetal Heart Rate (bpm): 140   Movement: Present     General:  Alert, oriented and cooperative. Patient is in no acute distress.  Skin: Skin is warm and dry. No rash noted.   Cardiovascular: Normal heart rate noted  Respiratory: Normal respiratory effort, no problems with respiration noted  Abdomen: Soft, gravid, appropriate for gestational age.  Pain/Pressure: Present     Pelvic: Cervical exam deferred        Extremities: Normal range of motion.  Edema: None  Mental Status:  Normal mood and affect. Normal behavior. Normal judgment and thought content.   Assessment and Plan:  Pregnancy: A5W0981 at [redacted]w[redacted]d  1. Supervision of other normal pregnancy, antepartum Continue routine prenatal care. 28 wks labs next visit Cannot get BabyScripts working  2. Sciatica Note given to change apartments Heat, ice, maternity support belt  Preterm labor symptoms and general obstetric precautions including but not limited to vaginal bleeding, contractions, leaking of fluid and fetal movement were  reviewed in detail with the patient. Please refer to After Visit Summary for other counseling recommendations.  Return in 3 weeks (on 08/31/2017).   Reva Bores, MD

## 2017-08-10 NOTE — Progress Notes (Signed)
BRX order placed so her BP and Weight will flow into chart

## 2017-09-04 ENCOUNTER — Encounter: Payer: Medicaid Other | Admitting: Obstetrics & Gynecology

## 2017-09-06 ENCOUNTER — Observation Stay
Admission: EM | Admit: 2017-09-06 | Discharge: 2017-09-07 | Disposition: A | Discharge status: Home / Self Care | Payer: Medicaid Other | Attending: Obstetrics and Gynecology | Admitting: Obstetrics and Gynecology

## 2017-09-06 DIAGNOSIS — O26893 Other specified pregnancy related conditions, third trimester: Principal | ICD-10-CM | POA: Insufficient documentation

## 2017-09-06 DIAGNOSIS — R102 Pelvic and perineal pain: Secondary | ICD-10-CM | POA: Insufficient documentation

## 2017-09-06 DIAGNOSIS — A6000 Herpesviral infection of urogenital system, unspecified: Secondary | ICD-10-CM | POA: Insufficient documentation

## 2017-09-06 DIAGNOSIS — Z3A29 29 weeks gestation of pregnancy: Secondary | ICD-10-CM | POA: Insufficient documentation

## 2017-09-06 DIAGNOSIS — O98313 Other infections with a predominantly sexual mode of transmission complicating pregnancy, third trimester: Secondary | ICD-10-CM | POA: Insufficient documentation

## 2017-09-07 ENCOUNTER — Telehealth: Payer: Self-pay

## 2017-09-07 DIAGNOSIS — O98313 Other infections with a predominantly sexual mode of transmission complicating pregnancy, third trimester: Secondary | ICD-10-CM | POA: Diagnosis not present

## 2017-09-07 DIAGNOSIS — R102 Pelvic and perineal pain: Secondary | ICD-10-CM | POA: Diagnosis not present

## 2017-09-07 DIAGNOSIS — A6000 Herpesviral infection of urogenital system, unspecified: Secondary | ICD-10-CM | POA: Diagnosis not present

## 2017-09-07 DIAGNOSIS — O26893 Other specified pregnancy related conditions, third trimester: Secondary | ICD-10-CM | POA: Diagnosis not present

## 2017-09-07 DIAGNOSIS — Z3A29 29 weeks gestation of pregnancy: Secondary | ICD-10-CM | POA: Diagnosis not present

## 2017-09-07 LAB — COMPREHENSIVE METABOLIC PANEL
ALBUMIN: 2.7 g/dL — AB (ref 3.5–5.0)
ALT: 7 U/L — AB (ref 14–54)
AST: 17 U/L (ref 15–41)
Alkaline Phosphatase: 72 U/L (ref 38–126)
Anion gap: 6 (ref 5–15)
BILIRUBIN TOTAL: 0.4 mg/dL (ref 0.3–1.2)
BUN: 7 mg/dL (ref 6–20)
CHLORIDE: 106 mmol/L (ref 101–111)
CO2: 25 mmol/L (ref 22–32)
CREATININE: 0.69 mg/dL (ref 0.44–1.00)
Calcium: 8.1 mg/dL — ABNORMAL LOW (ref 8.9–10.3)
GFR calc Af Amer: >60 mL/min (ref >60)
GFR calc non Af Amer: >60 mL/min (ref >60)
GLUCOSE: 93 mg/dL (ref 65–99)
POTASSIUM: 3.1 mmol/L — AB (ref 3.5–5.1)
Sodium: 137 mmol/L (ref 135–145)
Total Protein: 7.4 g/dL (ref 6.5–8.1)

## 2017-09-07 LAB — CBC WITH DIFFERENTIAL/PLATELET
BASOS ABS: 0.1 10*3/uL (ref 0–0.1)
BASOS PCT: 1 %
Eosinophils Absolute: 0.2 10*3/uL (ref 0–0.7)
Eosinophils Relative: 2 %
HEMATOCRIT: 30.8 % — AB (ref 35.0–47.0)
Hemoglobin: 10.3 g/dL — ABNORMAL LOW (ref 12.0–16.0)
LYMPHS PCT: 26 %
Lymphs Abs: 2.6 10*3/uL (ref 1.0–3.6)
MCH: 30.4 pg (ref 26.0–34.0)
MCHC: 33.5 g/dL (ref 32.0–36.0)
MCV: 90.9 fL (ref 80.0–100.0)
MONO ABS: 0.4 10*3/uL (ref 0.2–0.9)
Monocytes Relative: 4 %
NEUTROS ABS: 6.8 10*3/uL — AB (ref 1.4–6.5)
NEUTROS PCT: 67 %
PLATELETS: 327 10*3/uL (ref 150–440)
RBC: 3.39 MIL/uL — AB (ref 3.80–5.20)
RDW: 14.1 % (ref 11.5–14.5)
WBC: 10 10*3/uL (ref 3.6–11.0)

## 2017-09-07 LAB — URINALYSIS, ROUTINE W REFLEX MICROSCOPIC
Bilirubin Urine: NEGATIVE
GLUCOSE, UA: NEGATIVE mg/dL
Hgb urine dipstick: NEGATIVE
KETONES UR: NEGATIVE mg/dL
LEUKOCYTES UA: NEGATIVE
NITRITE: NEGATIVE
PH: 7 (ref 5.0–8.0)
Protein, ur: NEGATIVE mg/dL
SPECIFIC GRAVITY, URINE: 1.01 (ref 1.005–1.030)

## 2017-09-07 LAB — CHLAMYDIA/NGC RT PCR (ARMC ONLY)
Chlamydia Tr: NOT DETECTED
N gonorrhoeae: NOT DETECTED

## 2017-09-07 LAB — FETAL FIBRONECTIN: Fetal Fibronectin: NEGATIVE

## 2017-09-07 LAB — WET PREP, GENITAL
Sperm: NONE SEEN
Trich, Wet Prep: NONE SEEN
YEAST WET PREP: NONE SEEN

## 2017-09-07 LAB — ROM PLUS (ARMC ONLY): ROM PLUS: NEGATIVE

## 2017-09-07 MED ORDER — KETOROLAC TROMETHAMINE 30 MG/ML IJ SOLN
30.0000 mg | Freq: Once | INTRAMUSCULAR | Status: AC
  Administered 2017-09-07: 30 mg via INTRAVENOUS
  Filled 2017-09-07: qty 1

## 2017-09-07 MED ORDER — CYCLOBENZAPRINE HCL 10 MG PO TABS
10.0000 mg | ORAL_TABLET | Freq: Three times a day (TID) | ORAL | Status: DC | PRN
  Administered 2017-09-07: 10 mg via ORAL
  Filled 2017-09-07: qty 1

## 2017-09-07 MED ORDER — LACTATED RINGERS IV SOLN
INTRAVENOUS | Status: DC
  Administered 2017-09-07: 01:00:00 via INTRAVENOUS
  Administered 2017-09-07: 1000 mL via INTRAVENOUS

## 2017-09-07 NOTE — Discharge Summary (Signed)
Obstetric Discharge Summary Reason for Admission: pelvic pain  Prenatal Procedures: [place] Intrapartum Procedure:Ortho consult Postpartum Procedures: N/A Complications-Operative and Postpartum: pubic pain  Hemoglobin  Date Value Ref Range Status  09/07/2017 10.3 (L) 12.0 - 16.0 g/dL Final  46/96/2952 84.1 11.1 - 15.9 g/dL Final   HCT  Date Value Ref Range Status  09/07/2017 30.8 (L) 35.0 - 47.0 % Final   Hematocrit  Date Value Ref Range Status  05/07/2017 37.3 34.0 - 46.6 % Final    Physical Exam:  General:AA&O x3 Resp reg and non-labored Uterine Fundus:Gravid +pain in the symphysis pubis area DVT Evaluation:Neg Homans  Discharge Diagnoses: IUP at 29 1/7 weeks for pelvic pain   Discharge Information: Date: 09/07/2017 Activity: pelvic rest Diet: routine Medications: PNV and Iron Condition: stable Instructions: refer to practice specific booklet and Rest at home, Tylenol OTC, Heat and or ice, Walker if needed Discharge to: home   Newborn Data: This patient has no babies on file.  Sharee Pimple 09/07/2017, 1:18 PM

## 2017-09-07 NOTE — Consult Note (Signed)
Reason for Consult: Pelvic pain  Referring Physician: Dr. [name] is an 26 y.o. female.  HPI: Patient is [redacted] weeks pregnant female who developed pain in the pubic symphysis region yesterday without injury.  This is her third pregnancy.  She is comfortable at rest.  She is at increased pain with movement.  She is currently in the observation unit at [facility].  She has responded to report all and muscle relaxers.  She denies any numbness or tingling in the legs.  There is no significant back pain.  Past Medical History:  Diagnosis Date  . BV (bacterial vaginosis)   . Chlamydia   . Herpes genitalis in women   . HSV infection   . Miscarriage   . Trichomonas   . UTI (lower urinary tract infection)     Past Surgical History:  Procedure Laterality Date  . NO PAST SURGERIES      Family History  Problem Relation Age of Onset  . Hypertension Mother   . Hypertension Brother     Social History:  reports that she has never smoked. She has never used smokeless tobacco. She reports that she does not drink alcohol or use drugs.  Allergies: No Known Allergies  Medications: I have reviewed the patient's current medications.  Results for orders placed or performed during the hospital encounter of 09/06/17 (from the past 48 hour(s))  Fetal fibronectin     Status: None   Collection Time: 09/07/17 12:45 AM  Result Value Ref Range   Fetal Fibronectin NEGATIVE NEGATIVE   Appearance, FETFIB CLEAR CLEAR  Chlamydia/NGC rt PCR ([facility] only)     Status: None   Collection Time: 09/07/17 12:55 AM  Result Value Ref Range   Specimen source GC/Chlam VAGINA    Chlamydia Tr NOT DETECTED NOT DETECTED   N gonorrhoeae NOT DETECTED NOT DETECTED    Comment: (NOTE) 100  This methodology has not been evaluated in pregnant women or in 200  patients with a history of hysterectomy. 300 400  This methodology will not be performed on patients less than 26  years of age.   Wet prep, genital     Status:  Abnormal   Collection Time: 09/07/17 12:55 AM  Result Value Ref Range   Yeast Wet Prep HPF POC NONE SEEN NONE SEEN   Trich, Wet Prep NONE SEEN NONE SEEN   Clue Cells Wet Prep HPF POC PRESENT (A) NONE SEEN   WBC, Wet Prep HPF POC FEW (A) NONE SEEN   Sperm NONE SEEN   Urinalysis, Routine w reflex microscopic     Status: Abnormal   Collection Time: 09/07/17 12:55 AM  Result Value Ref Range   Color, Urine YELLOW (A) YELLOW   APPearance CLEAR (A) CLEAR   Specific Gravity, Urine 1.010 1.005 - 1.030   pH 7.0 5.0 - 8.0   Glucose, UA NEGATIVE NEGATIVE mg/dL   Hgb urine dipstick NEGATIVE NEGATIVE   Bilirubin Urine NEGATIVE NEGATIVE   Ketones, ur NEGATIVE NEGATIVE mg/dL   Protein, ur NEGATIVE NEGATIVE mg/dL   Nitrite NEGATIVE NEGATIVE   Leukocytes, UA NEGATIVE NEGATIVE  ROM Plus ([facility] only)     Status: None   Collection Time: 09/07/17 12:55 AM  Result Value Ref Range   Rom Plus NEGATIVE   CBC with Differential/Platelet     Status: Abnormal   Collection Time: 09/07/17  1:08 AM  Result Value Ref Range   WBC 10.0 3.6 - 11.0 K/uL   RBC 3.39 (L) 3.80 -  5.20 MIL/uL   Hemoglobin 10.3 (L) 12.0 - 16.0 g/dL   HCT 30.8 (L) 35.0 - 47.0 %   MCV 90.9 80.0 - 100.0 fL   MCH 30.4 26.0 - 34.0 pg   MCHC 33.5 32.0 - 36.0 g/dL   RDW 14.1 11.5 - 14.5 %   Platelets 327 150 - 440 K/uL   Neutrophils Relative % 67 %   Neutro Abs 6.8 (H) 1.4 - 6.5 K/uL   Lymphocytes Relative 26 %   Lymphs Abs 2.6 1.0 - 3.6 K/uL   Monocytes Relative 4 %   Monocytes Absolute 0.4 0.2 - 0.9 K/uL   Eosinophils Relative 2 %   Eosinophils Absolute 0.2 0 - 0.7 K/uL   Basophils Relative 1 %   Basophils Absolute 0.1 0 - 0.1 K/uL  Comprehensive metabolic panel     Status: Abnormal   Collection Time: 09/07/17  1:08 AM  Result Value Ref Range   Sodium 137 135 - 145 mmol/L   Potassium 3.1 (L) 3.5 - 5.1 mmol/L   Chloride 106 101 - 111 mmol/L   CO2 25 22 - 32 mmol/L   Glucose, Bld 93 65 - 99 mg/dL   BUN 7 6 - 20 mg/dL    Creatinine, Ser 0.69 0.44 - 1.00 mg/dL   Calcium 8.1 (L) 8.9 - 10.3 mg/dL   Total Protein 7.4 6.5 - 8.1 g/dL   Albumin 2.7 (L) 3.5 - 5.0 g/dL   AST 17 15 - 41 U/L   ALT 7 (L) 14 - 54 U/L   Alkaline Phosphatase 72 38 - 126 U/L   Total Bilirubin 0.4 0.3 - 1.2 mg/dL   GFR calc non Af Amer >60 >60 mL/min   GFR calc Af Amer >60 >60 mL/min    Comment: (NOTE) The eGFR has been calculated using the CKD EPI equation. This calculation has not been validated in all clinical situations. eGFR's persistently <60 mL/min signify possible Chronic Kidney Disease.    Anion gap 6 5 - 15    No results found.  ROS Blood pressure 110/68, pulse 78, temperature 98.3 F (36.8 C), temperature source Oral, resp. rate 18, height '5\' 4"'$  (1.626 m), weight 74.8 kg (165 lb), last menstrual period 02/15/2017, unknown if currently breastfeeding.   Physical Exam  Patient is alert and cooperative.  She has normal neurovascular status in both lower extremities.  Both hips and knees move freely without pain.  He is able to stand.  The back is nontender and the sacroiliac joints are nontender.  She is mildly tender over the pubic symphysis only.  Skin is intact.  Assessment/Plan: Inflammation pubic symphysis region   I would recommend conservative treatment at this time. Continue medications which have not helped her for this thus far. Issue her a walker for ambulation if needed. Use heat to the area as needed. I don't believe an x-ray is necessary at this time.  If pain persists after childbirth, X-rays can be done in our office.  Jody Perez E 09/07/2017, 12:45 PM

## 2017-09-07 NOTE — Progress Notes (Signed)
Patient ID: Jody Perez, female   DOB: [birth year], 26 y.o.   MRN: [id number] Pt is resting well. Dr Hyacinth Meeker has seen pt and diagnosed with inflammation of the symphysis pubis. Advised to use a walker if needed and will dc home to rest.

## 2017-09-07 NOTE — OB Triage Provider Note (Signed)
TRIAGE VISIT with NST   Jody Perez is a 26 y.o. [id number]. She is at [redacted]w[redacted]d gestation. Pt of [facility],  Indication: Acute onset significant pelvic pain  S: Pt is G4P2012 at 29+1wks with acute onset pelvic pressure/pelvic pain that started last night. She has difficulty describing pain, but states it best as it "feels like the baby is going to fall out." However, she denies contraction pain. She did have right leg/hip pain recently evaluated at our ER in 08/07/17, that they thought might be IT band syndrome and sciatica. She felt that her right leg might "give out" and stop bearing her weight, and was walking gingerly. This pain might  be a continuation of that pain, but it is hard to describe.  On evaluation, she is comfortable as long as she isn't moving. However, passive sitting up by using the bed caused her significant pain. She was unable to move her legs over to the side of the bed to dangle, and even the nurse moving her legs hurt significantly. She appeared to be protecting her lower back, but did weep silently from the effort of sitting. Once she is still, the pain resolves. She walked with difficulty, exclusively with weight bearing.   On the monitor, occasional contractions that did not coincide with her pain. She notes a small gush of clear/pink fluid earlier today. Nitrazine negative, no pooling now.  Her cervix is closed and long. Both prior deliveries at term.  Vitals wnl  Pregnancy c/b: - short interpregnancy interval, with NSVD in Jan 2018.    Resting comfortably. no CTX, no VB. Active fetal movement.  O:  BP 118/71 (BP Location: Right Arm)   Pulse 79   Temp 98.2 F (36.8 C) (Oral)   Resp (!) 22   LMP 02/15/2017 (Exact Date)  No results found for this or any previous visit (from the past 48 hour(s)).   Gen: NAD, AAOx3      Abd: FNTTP. No RLQ tenderness Back: No CVA tenderness, no spine tenderness. However, just AFTER the exam on the right a Achord pain flared that  required her to arch her back, almost like a muscle spasm that released after about 2 minutes.       Ext: Non-tender, Nonedmeatous, homan's neg,     FHT: 155, mod var, non-reactive but appropriate for gestational age TOCO: intermittent contractions after exam= will continue to monitor SVE: Dilation: Closed Effacement (%): Thick Cervical Position: Posterior Station: -3 Presentation: Undeterminable Exam by:: T.Roberts RN   A/P:  26 y.o. [id number] [redacted]w[redacted]d with acute on chronic lower back/pelvic pain. DDx includes preterm labor, muscle spasm, pubic symphasitis, septic abortion, vaginal infection. Her pain is notable and does not appear to be related to secondary gain. She appears to be stoic and trying to minimize it, but is significantly limited by it. It does not appear to be solely obstetric in origin.  - CMP/CBC for infection, and kidney evaluation - UA/ Uculture for infection - ROM-plus, wet prep, GC/CT for vaginitis - IVF bolus - Continuous toco,  f/u fetal fibronection - Consult to orthopedics for evaluation and to expand differential.  - Continuous fetal monitoring.  - For pain control, po flexeril and IV toradol x1 (prior to 32weeks) tonight.

## 2017-09-07 NOTE — Discharge Instructions (Signed)
Pelvic Pain, Female °Pelvic pain is pain in your lower belly (abdomen), below your belly button and between your hips. The pain may start suddenly (acute), keep coming back (recurring), or last a long time (chronic). Pelvic pain that lasts longer than six months is considered chronic. There are many causes of pelvic pain. Sometimes the cause of your pelvic pain is not known. °Follow these instructions at home: °· Take over-the-counter and prescription medicines only as told by your doctor. °· Rest as told by your doctor. °· Do not have sex it if hurts. °· Keep a journal of your pelvic pain. Write down: °? When the pain started. °? Where the pain is located. °? What seems to make the pain better or worse, such as food or your menstrual cycle. °? Any symptoms you have along with the pain. °· Keep all follow-up visits as told by your doctor. This is important. °Contact a doctor if: °· Medicine does not help your pain. °· Your pain comes back. °· You have new symptoms. °· You have unusual vaginal discharge or bleeding. °· You have a fever or chills. °· You are having a hard time pooping (constipation). °· You have blood in your pee (urine) or poop (stool). °· Your pee smells bad. °· You feel weak or lightheaded. °Get help right away if: °· You have sudden pain that is very bad. °· Your pain continues to get worse. °· You have very bad pain and also have any of the following symptoms: °? A fever. °? Feeling stick to your stomach (nausea). °? Throwing up (vomiting). °? Being very sweaty. °· You pass out (lose consciousness). °This information is not intended to replace advice given to you by your health care provider. Make sure you discuss any questions you have with your health care provider. °Document Released: 05/01/2008 Document Revised: 12/08/2015 Document Reviewed: 09/03/2015 °Elsevier Interactive Patient Education © 2018 [employer] ° °

## 2017-09-08 LAB — URINE CULTURE
Culture: 20000 — AB
SPECIAL REQUESTS: NORMAL

## 2017-09-09 LAB — RPR: RPR Ser Ql: NONREACTIVE

## 2017-09-13 ENCOUNTER — Encounter: Payer: Self-pay | Admitting: Family Medicine

## 2017-09-13 ENCOUNTER — Encounter: Payer: Medicaid Other | Admitting: Family Medicine

## 2017-09-13 NOTE — Progress Notes (Signed)
Patient did not keep appointment today. She will be called to reschedule.  

## 2017-09-14 ENCOUNTER — Telehealth: Payer: Self-pay | Admitting: Radiology

## 2017-09-14 NOTE — Telephone Encounter (Signed)
Left message explaining that we needed to reschedule her 278 Wk/ GTT appointment that she missed on 09/13/17. Call [facility] to reschedule.

## 2017-09-18 ENCOUNTER — Ambulatory Visit (INDEPENDENT_AMBULATORY_CARE_PROVIDER_SITE_OTHER): Payer: Medicaid Other | Admitting: Obstetrics & Gynecology

## 2017-09-18 VITALS — BP 122/67 | HR 91 | Wt 167.2 lb

## 2017-09-18 DIAGNOSIS — Z3483 Encounter for supervision of other normal pregnancy, third trimester: Secondary | ICD-10-CM

## 2017-09-18 DIAGNOSIS — Z8619 Personal history of other infectious and parasitic diseases: Secondary | ICD-10-CM

## 2017-09-18 DIAGNOSIS — Z23 Encounter for immunization: Secondary | ICD-10-CM

## 2017-09-18 DIAGNOSIS — Z3480 Encounter for supervision of other normal pregnancy, unspecified trimester: Secondary | ICD-10-CM

## 2017-09-18 MED ORDER — VALACYCLOVIR HCL 1 G PO TABS: 1000.0000 mg | ORAL_TABLET | Freq: Every day | ORAL | 2 refills | Status: DC

## 2017-09-18 NOTE — Progress Notes (Signed)
   PRENATAL VISIT NOTE  Subjective:  Jody Perez is a 26 y.o. A5W0981G4P2012 at 5239w5d being seen today for ongoing prenatal care.  She is currently monitored for the following issues for this low-risk pregnancy and has History of genital HSV; Supervision of other normal pregnancy, antepartum; and Short interval between pregnancies affecting pregnancy, antepartum on her problem list.  Patient reports no complaints.   .  .  Movement: Present. Denies leaking of fluid.   The following portions of the patient's history were reviewed and updated as appropriate: allergies, current medications, past family history, past medical history, past social history, past surgical history and problem list. Problem list updated.  Objective:   Vitals:   09/18/17 0901  BP: 122/67  Pulse: 91  Weight: 167 lb 3.2 oz (75.8 kg)    Fetal Status: Fetal Heart Rate (bpm): 145 Fundal Height: 31 cm Movement: Present     General:  Alert, oriented and cooperative. Patient is in no acute distress.  Skin: Skin is warm and dry. No rash noted.   Cardiovascular: Normal heart rate noted  Respiratory: Normal respiratory effort, no problems with respiration noted  Abdomen: Soft, gravid, appropriate for gestational age.  Pain/Pressure: Present     Pelvic: Cervical exam deferred        Extremities: Normal range of motion.  Edema: None  Mental Status:  Normal mood and affect. Normal behavior. Normal judgment and thought content.   Assessment and Plan:  Pregnancy: [id number] at 7639w5d  1. History of genital HSV Will start suppression at 35 weeks.  - valACYclovir (VALTREX) 1000 MG tablet; Take 1 tablet (1,000 mg total) by mouth daily.  Dispense: 30 tablet; Refill: 2  2. Supervision of other normal pregnancy, antepartum Third trimester labs today. Received Tdap, declines Flu.  - Glucose Tolerance, 2 Hours w/1 Hour - CBC - HIV antibody - RPR Unable to continue Babyscripts optimization schedule, regular schedule instituted. Preterm  labor symptoms and general obstetric precautions including but not limited to vaginal bleeding, contractions, leaking of fluid and fetal movement were reviewed in detail with the patient. Please refer to After Visit Summary for other counseling recommendations.  Return in about 2 weeks (around 10/02/2017) for OB Visit.   Jaynie CollinsUgonna Armilda Vanderlinden, MD

## 2017-09-18 NOTE — Patient Instructions (Signed)
Return to clinic for any scheduled appointments or obstetric concerns, or go to [facility] for evaluation   Third Trimester of Pregnancy The third trimester is from week 28 through week 40 (months 7 through 9). The third trimester is a time when the unborn baby (fetus) is growing rapidly. At the end of the ninth month, the fetus is about 20 inches in length and weighs 6-10 pounds. Body changes during your third trimester Your body will continue to go through many changes during pregnancy. The changes vary from woman to woman. During the third trimester:  Your weight will continue to increase. You can expect to gain 25-35 pounds (11-16 kg) by the end of the pregnancy.  You may begin to get stretch marks on your hips, abdomen, and breasts.  You may urinate more often because the fetus is moving lower into your pelvis and pressing on your bladder.  You may develop or continue to have heartburn. This is caused by increased hormones that slow down muscles in the digestive tract.  You may develop or continue to have constipation because increased hormones slow digestion and cause the muscles that push waste through your intestines to relax.  You may develop hemorrhoids. These are swollen veins (varicose veins) in the rectum that can itch or be painful.  You may develop swollen, bulging veins (varicose veins) in your legs.  You may have increased body aches in the pelvis, back, or thighs. This is due to weight gain and increased hormones that are relaxing your joints.  You may have changes in your hair. These can include thickening of your hair, rapid growth, and changes in texture. Some women also have hair loss during or after pregnancy, or hair that feels dry or thin. Your hair will most likely return to normal after your baby is born.  Your breasts will continue to grow and they will continue to become tender. A yellow fluid (colostrum) may leak from your breasts. This is the first milk you are  producing for your baby.  Your belly button may stick out.  You may notice more swelling in your hands, face, or ankles.  You may have increased tingling or numbness in your hands, arms, and legs. The skin on your belly may also feel numb.  You may feel short of breath because of your expanding uterus.  You may have more problems sleeping. This can be caused by the size of your belly, increased need to urinate, and an increase in your body's metabolism.  You may notice the fetus "dropping," or moving lower in your abdomen (lightening).  You may have increased vaginal discharge.  You may notice your joints feel loose and you may have pain around your pelvic bone.  What to expect at prenatal visits You will have prenatal exams every 2 weeks until week 36. Then you will have weekly prenatal exams. During a routine prenatal visit:  You will be weighed to make sure you and the baby are growing normally.  Your blood pressure will be taken.  Your abdomen will be measured to track your baby's growth.  The fetal heartbeat will be listened to.  Any test results from the previous visit will be discussed.  You may have a cervical check near your due date to see if your cervix has softened or thinned (effaced).  You will be tested for Group B streptococcus. This happens between 35 and 37 weeks.  Your health care provider may ask you:  What your birth plan is.    How you are feeling.  If you are feeling the baby move.  If you have had any abnormal symptoms, such as leaking fluid, bleeding, severe headaches, or abdominal cramping.  If you are using any tobacco products, including cigarettes, chewing tobacco, and electronic cigarettes.  If you have any questions.  Other tests or screenings that may be performed during your third trimester include:  Blood tests that check for low iron levels (anemia).  Fetal testing to check the health, activity level, and growth of the fetus.  Testing is done if you have certain medical conditions or if there are problems during the pregnancy.  Nonstress test (NST). This test checks the health of your baby to make sure there are no signs of problems, such as the baby not getting enough oxygen. During this test, a belt is placed around your belly. The baby is made to move, and its heart rate is monitored during movement.  What is false labor? False labor is a condition in which you feel small, irregular tightenings of the muscles in the womb (contractions) that usually go away with rest, changing position, or drinking water. These are called Braxton Hicks contractions. Contractions may last for hours, days, or even weeks before true labor sets in. If contractions come at regular intervals, become more frequent, increase in intensity, or become painful, you should see your health care provider. What are the signs of labor?  Abdominal cramps.  Regular contractions that start at 10 minutes apart and become stronger and more frequent with time.  Contractions that start on the top of the uterus and spread down to the lower abdomen and back.  Increased pelvic pressure and dull back pain.  A watery or bloody mucus discharge that comes from the vagina.  Leaking of amniotic fluid. This is also known as your "water breaking." It could be a slow trickle or a gush. Let your health care provider know if it has a color or strange odor. If you have any of these signs, call your health care provider right away, even if it is before your due date. Follow these instructions at home: Medicines  Follow your health care provider's instructions regarding medicine use. Specific medicines may be either safe or unsafe to take during pregnancy.  Take a prenatal vitamin that contains at least 600 micrograms (mcg) of folic acid.  If you develop constipation, try taking a stool softener if your health care provider approves. Eating and drinking  Eat a  balanced diet that includes fresh fruits and vegetables, whole grains, good sources of protein such as meat, eggs, or tofu, and low-fat dairy. Your health care provider will help you determine the amount of weight gain that is right for you.  Avoid raw meat and uncooked cheese. These carry germs that can cause birth defects in the baby.  If you have low calcium intake from food, talk to your health care provider about whether you should take a daily calcium supplement.  Eat four or five small meals rather than three large meals a day.  Limit foods that are high in fat and processed sugars, such as fried and sweet foods.  To prevent constipation: ? Drink enough fluid to keep your urine clear or pale yellow. ? Eat foods that are high in fiber, such as fresh fruits and vegetables, whole grains, and beans. Activity  Exercise only as directed by your health care provider. Most women can continue their usual exercise routine during pregnancy. Try to exercise for 30   minutes at least 5 days a week. Stop exercising if you experience uterine contractions.  Avoid heavy lifting.  Do not exercise in extreme heat or humidity, or at high altitudes.  Wear low-heel, comfortable shoes.  Practice good posture.  You may continue to have sex unless your health care provider tells you otherwise. Relieving pain and discomfort  Take frequent breaks and rest with your legs elevated if you have leg cramps or low back pain.  Take warm sitz baths to soothe any pain or discomfort caused by hemorrhoids. Use hemorrhoid cream if your health care provider approves.  Wear a good support bra to prevent discomfort from breast tenderness.  If you develop varicose veins: ? Wear support pantyhose or compression stockings as told by your healthcare provider. ? Elevate your feet for 15 minutes, 3-4 times a day. Prenatal care  Write down your questions. Take them to your prenatal visits.  Keep all your prenatal  visits as told by your health care provider. This is important. Safety  Wear your seat belt at all times when driving.  Make a list of emergency phone numbers, including numbers for family, friends, the hospital, and police and fire departments. General instructions  Avoid cat litter boxes and soil used by cats. These carry germs that can cause birth defects in the baby. If you have a cat, ask someone to clean the litter box for you.  Do not travel far distances unless it is absolutely necessary and only with the approval of your health care provider.  Do not use hot tubs, steam rooms, or saunas.  Do not drink alcohol.  Do not use any products that contain nicotine or tobacco, such as cigarettes and e-cigarettes. If you need help quitting, ask your health care provider.  Do not use any medicinal herbs or unprescribed drugs. These chemicals affect the formation and growth of the baby.  Do not douche or use tampons or scented sanitary pads.  Do not cross your legs for long periods of time.  To prepare for the arrival of your baby: ? Take prenatal classes to understand, practice, and ask questions about labor and delivery. ? Make a trial run to the hospital. ? Visit the hospital and tour the maternity area. ? Arrange for maternity or paternity leave through employers. ? Arrange for family and friends to take care of pets while you are in the hospital. ? Purchase a rear-facing car seat and make sure you know how to install it in your car. ? Pack your hospital bag. ? Prepare the baby's nursery. Make sure to remove all pillows and stuffed animals from the baby's crib to prevent suffocation.  Visit your dentist if you have not gone during your pregnancy. Use a soft toothbrush to brush your teeth and be gentle when you floss. Contact a health care provider if:  You are unsure if you are in labor or if your water has broken.  You become dizzy.  You have mild pelvic cramps, pelvic  pressure, or nagging pain in your abdominal area.  You have lower back pain.  You have persistent nausea, vomiting, or diarrhea.  You have an unusual or bad smelling vaginal discharge.  You have pain when you urinate. Get help right away if:  Your water breaks before 37 weeks.  You have regular contractions less than 5 minutes apart before 37 weeks.  You have a fever.  You are leaking fluid from your vagina.  You have spotting or bleeding from your vagina.    You have severe abdominal pain or cramping.  You have rapid weight loss or weight gain.  You have shortness of breath with chest pain.  You notice sudden or extreme swelling of your face, hands, ankles, feet, or legs.  Your baby makes fewer than 10 movements in 2 hours.  You have severe headaches that do not go away when you take medicine.  You have vision changes. Summary  The third trimester is from week 28 through week 40, months 7 through 9. The third trimester is a time when the unborn baby (fetus) is growing rapidly.  During the third trimester, your discomfort may increase as you and your baby continue to gain weight. You may have abdominal, leg, and back pain, sleeping problems, and an increased need to urinate.  During the third trimester your breasts will keep growing and they will continue to become tender. A yellow fluid (colostrum) may leak from your breasts. This is the first milk you are producing for your baby.  False labor is a condition in which you feel small, irregular tightenings of the muscles in the womb (contractions) that eventually go away. These are called Braxton Hicks contractions. Contractions may last for hours, days, or even weeks before true labor sets in.  Signs of labor can include: abdominal cramps; regular contractions that start at 10 minutes apart and become stronger and more frequent with time; watery or bloody mucus discharge that comes from the vagina; increased pelvic pressure  and dull back pain; and leaking of amniotic fluid. This information is not intended to replace advice given to you by your health care provider. Make sure you discuss any questions you have with your health care provider. Document Released: 11/07/2001 Document Revised: 04/20/2016 Document Reviewed: 01/14/2013 Elsevier Interactive Patient Education  2017 [employer]  

## 2017-09-18 NOTE — Progress Notes (Signed)
TDAP TODAY Declined flu

## 2017-09-19 LAB — CBC
HEMATOCRIT: 30.6 % — AB (ref 34.0–46.6)
HEMOGLOBIN: 10 g/dL — AB (ref 11.1–15.9)
MCH: 29.2 pg (ref 26.6–33.0)
MCHC: 32.7 g/dL (ref 31.5–35.7)
MCV: 89 fL (ref 79–97)
Platelets: 330 10*3/uL (ref 150–379)
RBC: 3.43 x10E6/uL — AB (ref 3.77–5.28)
RDW: 14.4 % (ref 12.3–15.4)
WBC: 9 10*3/uL (ref 3.4–10.8)

## 2017-09-19 LAB — RPR: RPR: NONREACTIVE

## 2017-09-19 LAB — GLUCOSE TOLERANCE, 2 HOURS W/ 1HR
GLUCOSE, 1 HOUR: 113 mg/dL (ref 65–179)
GLUCOSE, 2 HOUR: 86 mg/dL (ref 65–152)
Glucose, Fasting: 82 mg/dL (ref 65–91)

## 2017-09-19 LAB — HIV ANTIBODY (ROUTINE TESTING W REFLEX): HIV SCREEN 4TH GENERATION: NONREACTIVE

## 2017-09-23 ENCOUNTER — Ambulatory Visit
Admission: EM | Admit: 2017-09-23 | Discharge: 2017-09-23 | Disposition: A | Discharge status: Home / Self Care | Payer: Medicaid Other | Attending: Family Medicine | Admitting: Family Medicine

## 2017-09-23 DIAGNOSIS — Z331 Pregnant state, incidental: Secondary | ICD-10-CM | POA: Diagnosis not present

## 2017-09-23 DIAGNOSIS — N898 Other specified noninflammatory disorders of vagina: Secondary | ICD-10-CM

## 2017-09-23 DIAGNOSIS — O26893 Other specified pregnancy related conditions, third trimester: Principal | ICD-10-CM

## 2017-09-23 NOTE — ED Provider Notes (Signed)
[facility] ____________________________________________  Time seen: Approximately 4:00 PM  I have reviewed the triage vital signs and the nursing notes.   HISTORY  Chief Complaint Vaginal Discharge   HPI Jody Perez is a 26 y.o. female [redacted] weeks pregnant presenting for evaluation of 3-day report of watery wet vaginal discharge.  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  States current vaginal discharge is more clear occasionally whitish, and does not have an odor like her typical bacterial vaginosis.  Denies any associated abdominal pain, vaginal bleeding, back pain or fevers.  Reports continues to eat and drink well.  Reports continues to feel baby move well.  Gravida 3, para 2.  Reports pregnancy has been going well.  No over-the-counter medications used for the same complaints.  Reports otherwise feels well.  Denies chest pain, shortness of breath, abdominal pain, dysuria, or rash. Denies recent sickness.   Past Medical History:  Diagnosis Date  . BV (bacterial vaginosis)   . Chlamydia   . Herpes genitalis in women   . HSV infection   . Miscarriage   . Trichomonas   . UTI (lower urinary tract infection)     Patient Active Problem List   Diagnosis Date Noted  . Supervision of other normal pregnancy, antepartum 05/07/2017  . Short interval between pregnancies affecting pregnancy, antepartum 05/07/2017  . History of genital HSV 01/28/2013    Past Surgical History:  Procedure Laterality Date  . NO PAST SURGERIES       No current facility-administered medications for this encounter.   Current Outpatient Prescriptions:  .  acetaminophen (TYLENOL) 500 MG tablet, Take 500 mg by mouth every 6 (six) hours as needed., Disp: , Rfl:  .  Prenatal Vit-Fe Fumarate-FA (MULTIVITAMIN-PRENATAL) 27-0.8 MG TABS tablet, Take 1 tablet by mouth daily at 12  noon., Disp: , Rfl:  .  valACYclovir (VALTREX) 1000 MG tablet, Take 1 tablet (1,000 mg total) by mouth daily., Disp: 30 tablet, Rfl: 2 .  ondansetron (ZOFRAN ODT) 8 MG disintegrating tablet, Take 1 tablet (8 mg total) by mouth every 8 (eight) hours as needed for nausea or vomiting., Disp: 20 tablet, Rfl: 0  Allergies Patient has no known allergies.  Family History  Problem Relation Age of Onset  . Hypertension Mother   . Hypertension Brother     Social History Social History  Substance Use Topics  . Smoking status: Never Smoker  . Smokeless tobacco: Never Used  . Alcohol use No     Comment: socially    Review of Systems Constitutional: No fever/chills Cardiovascular: Denies chest pain. Respiratory: Denies shortness of breath. Gastrointestinal: No abdominal pain.  No nausea, no vomiting.  Genitourinary: Negative for dysuria. As above. Musculoskeletal: Negative for back pain. Skin: Negative for rash.   ____________________________________________   PHYSICAL EXAM:  VITAL SIGNS: ED Triage Vitals  Enc Vitals Group     BP 09/23/17 1506 113/72     Pulse Rate 09/23/17 1506 85     Resp 09/23/17 1506 18     Temp 09/23/17 1506 98.3 F (36.8 C)     Temp Source 09/23/17 1506 Oral     SpO2 09/23/17 1506 100 %     Weight 09/23/17 1507 167 lb (75.8 kg)     Height 09/23/17 1507 5\' 4"  (1.626 m)     Head Circumference --      Peak Flow --  Pain Score 09/23/17 1504 0     Pain Loc --      Pain Edu? --      Excl. in GC? --    Fetal heart tones 137 per RN.  Constitutional: Alert and oriented. Well appearing and in no acute distress. Cardiovascular: Normal rate, regular rhythm. Grossly normal heart sounds.  Good peripheral circulation. Respiratory: Normal respiratory effort without tachypnea nor retractions. Breath sounds are clear and equal bilaterally. No wheezes, rales, rhonchi. Gastrointestinal: Soft and nontender. Gravid abdomen   Neurologic:  Normal speech and  language.Speech is normal. No gait instability.  Skin:  Skin is warm, dry Psychiatric: Mood and affect are normal. Speech and behavior are normal. Patient exhibits appropriate insight and judgment   ___________________________________________   LABS (all labs ordered are listed, but only abnormal results are displayed)  Labs Reviewed - No data to display ____________________________________________  PROCEDURES Procedures   INITIAL IMPRESSION / ASSESSMENT AND PLAN / ED COURSE  Pertinent labs & imaging results that were available during my care of the patient were reviewed by me and considered in my medical decision making (see chart for details).  Very well-appearing patient.  No acute distress.  However discussed with patient patient describing vaginal discharge as wet and watery and somewhat different from her typical bacterial vaginosis infections.  Discussed in detail with patient as patient is 31 weeks, recommend evaluation by OB/GYN to ensure no rupture of membrane leaking.  Patient agrees to this, declines pelvic exam at this time.  States will go to [facility].  Dorinda Hillonald triage nurse called and given report.  Patient stable at time of discharge.  ____________________________________________   FINAL CLINICAL IMPRESSION(S) / ED DIAGNOSES  Final diagnoses:  Vaginal discharge during pregnancy in third trimester     Discharge Medication List as of 09/23/2017  4:01 PM      Note: This dictation was prepared with Dragon dictation along with smaller phrase technology. Any transcriptional errors that result from this process are unintentional.         Renford DillsMiller, Floris Neuhaus, NP 09/23/17 1828

## 2017-09-23 NOTE — ED Triage Notes (Addendum)
Patient complains of vaginal discharge. Patient describes this as a wet, watery discharge. Patient states that discharge started 3 days ago.  Patient is currently [redacted] weeks pregnant

## 2017-09-23 NOTE — Discharge Instructions (Signed)
Go directly to Emergency room.  °

## 2017-09-24 ENCOUNTER — Other Ambulatory Visit (HOSPITAL_COMMUNITY)
Admission: RE | Admit: 2017-09-24 | Discharge: 2017-09-24 | Disposition: A | Discharge status: Home / Self Care | Payer: Medicaid Other | Source: Ambulatory Visit | Attending: Obstetrics & Gynecology | Admitting: Obstetrics & Gynecology

## 2017-09-24 ENCOUNTER — Other Ambulatory Visit: Payer: Medicaid Other

## 2017-09-24 DIAGNOSIS — N898 Other specified noninflammatory disorders of vagina: Secondary | ICD-10-CM | POA: Diagnosis not present

## 2017-09-24 DIAGNOSIS — O26893 Other specified pregnancy related conditions, third trimester: Principal | ICD-10-CM

## 2017-09-24 DIAGNOSIS — B373 Candidiasis of vulva and vagina: Secondary | ICD-10-CM | POA: Insufficient documentation

## 2017-09-24 MED ORDER — TERCONAZOLE 0.4 % VA CREA: 1.0000 | TOPICAL_CREAM | Freq: Every day | VAGINAL | 0 refills | Status: DC

## 2017-09-24 NOTE — Progress Notes (Signed)
Patient presented to the office today for a self swab. Patient reports having some itching and clear discharge of which she thinks is BV, however reports no odor at this time. She reports having a history of BV and is requesting to have something called in for BV and yeast. Patient instructed on how to self swab. Culture was collected and label. Inform patient we would call her back once the results come back.

## 2017-09-25 LAB — CERVICOVAGINAL ANCILLARY ONLY
Bacterial vaginitis: NEGATIVE
CANDIDA VAGINITIS: POSITIVE — AB
CHLAMYDIA, DNA PROBE: NEGATIVE
Neisseria Gonorrhea: NEGATIVE
TRICH (WINDOWPATH): NEGATIVE

## 2017-10-02 ENCOUNTER — Encounter: Payer: Medicaid Other | Admitting: Obstetrics & Gynecology

## 2017-10-23 ENCOUNTER — Telehealth: Payer: Self-pay | Admitting: *Deleted

## 2017-10-23 ENCOUNTER — Other Ambulatory Visit (HOSPITAL_COMMUNITY)
Admission: RE | Admit: 2017-10-23 | Discharge: 2017-10-23 | Disposition: A | Discharge status: Home / Self Care | Payer: Medicaid Other | Source: Ambulatory Visit | Attending: Obstetrics & Gynecology | Admitting: Obstetrics & Gynecology

## 2017-10-23 ENCOUNTER — Ambulatory Visit (INDEPENDENT_AMBULATORY_CARE_PROVIDER_SITE_OTHER): Payer: Medicaid Other | Admitting: Obstetrics & Gynecology

## 2017-10-23 VITALS — BP 118/77 | HR 105 | Wt 171.4 lb

## 2017-10-23 DIAGNOSIS — O09899 Supervision of other high risk pregnancies, unspecified trimester: Secondary | ICD-10-CM

## 2017-10-23 DIAGNOSIS — Z8619 Personal history of other infectious and parasitic diseases: Secondary | ICD-10-CM

## 2017-10-23 DIAGNOSIS — Z3483 Encounter for supervision of other normal pregnancy, third trimester: Secondary | ICD-10-CM | POA: Diagnosis not present

## 2017-10-23 DIAGNOSIS — Z3480 Encounter for supervision of other normal pregnancy, unspecified trimester: Secondary | ICD-10-CM

## 2017-10-23 NOTE — Progress Notes (Signed)
   PRENATAL VISIT NOTE  Subjective:  Jody Perez is a 26 y.o. [id number] at 6344w5d being seen today for ongoing prenatal care.  She is currently monitored for the following issues for this low-risk pregnancy and has History of genital HSV; Supervision of other normal pregnancy, antepartum; and Short interval between pregnancies affecting pregnancy, antepartum on their problem list.  Patient reports headache. She has had some congestion. Denies leaking of fluid. No VB. +FM  The following portions of the patient's history were reviewed and updated as appropriate: allergies, current medications, past family history, past medical history, past social history, past surgical history and problem list. Problem list updated.  Objective:  There were no vitals filed for this visit.  Fetal Status: +FM  General:  Alert, oriented and cooperative. Patient is in no acute distress.  Skin: Skin is warm and dry. No rash noted.   Cardiovascular: Normal heart rate noted  Respiratory: Normal respiratory effort, no problems with respiration noted  Abdomen: Soft, gravid, appropriate for gestational age.        Pelvic: Cervical exam performed        Extremities: Normal range of motion.     Mental Status:  Normal mood and affect. Normal behavior. Normal judgment and thought content.   Assessment and Plan:  Pregnancy: [id number] at 3644w5d  1. Supervision of other normal pregnancy, antepartum 36 week cx and GBS done today  2. Short interval between pregnancies affecting pregnancy, antepartum  3. History of genital HSV Keep Valtrex  Preterm labor symptoms and general obstetric precautions including but not limited to vaginal bleeding, contractions, leaking of fluid and fetal movement were reviewed in detail with the patient. Please refer to After Visit Summary for other counseling recommendations.  Return in about 1 week (around 10/30/2017).   Willodean Rosenthalarolyn Harraway-Smith, MD

## 2017-10-23 NOTE — Telephone Encounter (Signed)
Called pt - she adv she had started Valtrex already.

## 2017-10-23 NOTE — Progress Notes (Signed)
Discuss Headache

## 2017-10-23 NOTE — Patient Instructions (Signed)
Natural Childbirth Natural childbirth is going through labor and delivery without any drugs to relieve pain. Additionally, fetal monitors are not used, a delivery is not done, and a surgical cut to enlarge the vaginal opening (episiotomy) is not made. With the help of a birthing professional (midwife or health care provider), you direct your own labor and delivery. Many women choose natural childbirth because it makes them feel more in control and in touch with their labor and delivery. Some woman also choose natural childbirth because they are concerned about medicines affecting them and their babies. Pregnant women with a high-risk pregnancy should not attempt natural childbirth. It is better to deliver the infant in a hospital if an emergency situation arises. Sometimes, a health care provider has to get involved for the health and safety of the mother and infant. Techniques for natural childbirth  The Lamaze method-This method teaches parents that having a baby is normal, healthy, and natural. It also teaches the mother to take a neutral position regarding pain medicine and numbing medicines and to make an informed decision about using these medicines when the time comes.  The Bradley method (also called husband-coached birth)-This method teaches the father or partner to be the birth coach. It encourages the mother to exercise and eat a balanced, nutritious diet. It also involves relaxation and deep breathing exercises and preparing the parents for emergency situations. Methods of dealing with labor pain and delivery  Meditation.  Yoga.  Hypnosis.  Acupuncture.  Massage.  Changing positions (walking, rocking, showering, leaning on birth balls).  Lying in warm water or a whirlpool bath.  Finding an activity that keeps your mind off of the labor pain.  Listening to soft music.  Focusing on a particular object (visual imagery). Before going into labor  Be sure you and your spouse or  partner are in agreement about having a natural childbirth.  Decide if your health care provider or a midwife will deliver your baby.  Decide if you will have your baby in the hospital, at a birthing center, or at home.  If you have children, make plans to have someone take care of them when you go to the hospital or birthing center.  Know the distance and the time it takes to go to the hospital or birthing center. Practice going there and time it to be sure.  Have a bag packed with a nightgown, bathrobe, and toiletries. Be ready to take it with you when you go into labor.  Keep phone numbers of your family and friends handy if you need to call someone when you go into labor.  Your spouse or partner should go to all the natural childbirth technique classes.  Talk with your health care provider about the possibility of a medical emergency and what will happen if that occurs. Advantages of natural childbirth  You are in control of your labor and delivery.  You will not take medicines that could affect you and the baby.  There are no invasive procedures, such as an episiotomy.  You and your spouse or partner will work together, which can increase your bond with each other.  In most delivery centers, your family and friends can be involved in the labor and delivery process. Disadvantages of natural childbirth  You will experience pain during your labor and delivery.  The methods of helping relieve your labor pains may not work for you.  You may feel disappointed if you decide to change your mind during labor and not   have a natural childbirth. After the delivery  You will be very tired.  You will be uncomfortable because of your uterus contracting. You will feel soreness around the vagina.  You may feel cold and shaky. This is a natural reaction. This information is not intended to replace advice given to you by your health care provider. Make sure you discuss any questions you  have with your health care provider. Document Released: 10/26/2008 Document Revised: 04/20/2016 Document Reviewed: 07/21/2013 Elsevier Interactive Patient Education  2017 Elsevier Inc.  

## 2017-10-23 NOTE — Telephone Encounter (Signed)
-----   Message from Willodean Rosenthalarolyn Harraway-Smith, MD sent at 10/23/2017  9:31 AM EST ----- Please call pt and make sure she is taking the Valtrex.  clh-S

## 2017-10-24 LAB — GC/CHLAMYDIA PROBE AMP (~~LOC~~) NOT AT ARMC
Chlamydia: NEGATIVE
Neisseria Gonorrhea: NEGATIVE

## 2017-10-25 LAB — STREP GP B NAA: Strep Gp B NAA: POSITIVE — AB

## 2017-10-30 ENCOUNTER — Ambulatory Visit (INDEPENDENT_AMBULATORY_CARE_PROVIDER_SITE_OTHER): Payer: Medicaid Other | Admitting: Obstetrics & Gynecology

## 2017-10-30 VITALS — BP 112/73 | HR 88 | Wt 172.0 lb

## 2017-10-30 DIAGNOSIS — O99820 Streptococcus B carrier state complicating pregnancy: Secondary | ICD-10-CM

## 2017-10-30 DIAGNOSIS — Z3483 Encounter for supervision of other normal pregnancy, third trimester: Secondary | ICD-10-CM

## 2017-10-30 DIAGNOSIS — Z8619 Personal history of other infectious and parasitic diseases: Secondary | ICD-10-CM

## 2017-10-30 DIAGNOSIS — Z3480 Encounter for supervision of other normal pregnancy, unspecified trimester: Secondary | ICD-10-CM

## 2017-10-30 NOTE — Progress Notes (Signed)
   PRENATAL VISIT NOTE  Subjective:  Jody Perez is a 26 y.o. [id number] at 3674w5d being seen today for ongoing prenatal care.  She is currently monitored for the following issues for this low-risk pregnancy and has History of genital HSV; Group B Streptococcus carrier, +RV culture, currently pregnant; Supervision of other normal pregnancy, antepartum; and Short interval between pregnancies affecting pregnancy, antepartum on their problem list.  Patient reports occasional contractions, desires cervical check.  Contractions: Irregular. Vag. Bleeding: None.  Movement: Present. Denies leaking of fluid.   The following portions of the patient's history were reviewed and updated as appropriate: allergies, current medications, past family history, past medical history, past social history, past surgical history and problem list. Problem list updated.  Objective:   Vitals:   10/30/17 0850  BP: 112/73  Pulse: 88  Weight: 172 lb (78 kg)    Fetal Status: Fetal Heart Rate (bpm): 140 Fundal Height: 37 cm Movement: Present  Presentation: Vertex  General:  Alert, oriented and cooperative. Patient is in no acute distress.  Skin: Skin is warm and dry. No rash noted.   Cardiovascular: Normal heart rate noted  Respiratory: Normal respiratory effort, no problems with respiration noted  Abdomen: Soft, gravid, appropriate for gestational age.  Pain/Pressure: Absent     Pelvic: Cervical exam performed Dilation: 1.5 Effacement (%): Thick Station: Ballotable  Extremities: Normal range of motion.  Edema: None  Mental Status:  Normal mood and affect. Normal behavior. Normal judgment and thought content.   Assessment and Plan:  Pregnancy: [id number] at 3474w5d  1. Group B Streptococcus carrier, +RV culture, currently pregnant Patient informed, will need PCN in labor.  2. History of genital HSV Continue Valtrex  3. Supervision of other normal pregnancy, antepartum Preterm labor symptoms and general obstetric  precautions including but not limited to vaginal bleeding, contractions, leaking of fluid and fetal movement were reviewed in detail with the patient. Please refer to After Visit Summary for other counseling recommendations.  Return in about 1 week (around 11/06/2017) for OB Visit.   Jaynie CollinsUgonna Flay Ghosh, MD

## 2017-10-30 NOTE — Patient Instructions (Signed)
Return to clinic for any scheduled appointments or obstetric concerns, or go to [facility] for evaluation  

## 2017-11-08 ENCOUNTER — Encounter: Payer: Medicaid Other | Admitting: Obstetrics and Gynecology

## 2017-11-13 ENCOUNTER — Ambulatory Visit (INDEPENDENT_AMBULATORY_CARE_PROVIDER_SITE_OTHER): Payer: Medicaid Other | Admitting: Obstetrics and Gynecology

## 2017-11-13 ENCOUNTER — Encounter: Payer: Self-pay | Admitting: Obstetrics and Gynecology

## 2017-11-13 VITALS — BP 107/63 | HR 88 | Wt 172.0 lb

## 2017-11-13 DIAGNOSIS — Z8619 Personal history of other infectious and parasitic diseases: Secondary | ICD-10-CM

## 2017-11-13 DIAGNOSIS — Z3483 Encounter for supervision of other normal pregnancy, third trimester: Secondary | ICD-10-CM | POA: Diagnosis not present

## 2017-11-13 DIAGNOSIS — Z3480 Encounter for supervision of other normal pregnancy, unspecified trimester: Secondary | ICD-10-CM

## 2017-11-13 DIAGNOSIS — O99820 Streptococcus B carrier state complicating pregnancy: Secondary | ICD-10-CM | POA: Diagnosis not present

## 2017-11-13 NOTE — Progress Notes (Signed)
Prenatal Visit Note Date: 11/13/2017 Clinic: [facility]  Subjective:  Jody Perez is a 26 y.o. [phone] at 5228w5d being seen today for ongoing prenatal care.  She is currently monitored for the following issues for this low-risk pregnancy and has History of genital HSV; Group B Streptococcus carrier, +RV culture, currently pregnant; Supervision of other normal pregnancy, antepartum; and Short interval between pregnancies affecting pregnancy, antepartum on their problem list.  Patient reports no complaints.   Contractions: Irregular. Vag. Bleeding: None.  Movement: Present. Denies leaking of fluid.   The following portions of the patient's history were reviewed and updated as appropriate: allergies, current medications, past family history, past medical history, past social history, past surgical history and problem list. Problem list updated.  Objective:   Vitals:   11/13/17 1343  BP: 107/63  Pulse: 88  Weight: 172 lb (78 kg)    Fetal Status: Fetal Heart Rate (bpm): 125 Fundal Height: 38 cm Movement: Present  Presentation: Vertex  General:  Alert, oriented and cooperative. Patient is in no acute distress.  Skin: Skin is warm and dry. No rash noted.   Cardiovascular: Normal heart rate noted  Respiratory: Normal respiratory effort, no problems with respiration noted  Abdomen: Soft, gravid, appropriate for gestational age. Pain/Pressure: Present     Pelvic:  Cervical exam deferred Dilation: 1.5 Effacement (%): Thick Station: Ballotable  Extremities: Normal range of motion.  Edema: None  Mental Status: Normal mood and affect. Normal behavior. Normal judgment and thought content.   Urinalysis:      Assessment and Plan:  Pregnancy: [id number] at 3528w5d  1. Group B Streptococcus carrier, +RV culture, currently pregnant tx in labor  2. History of genital HSV Continue valtrex ppx  3. Supervision of other normal pregnancy, antepartum depo  Term labor symptoms and  general obstetric precautions including but not limited to vaginal bleeding, contractions, leaking of fluid and fetal movement were reviewed in detail with the patient. Please refer to After Visit Summary for other counseling recommendations.  Return in about 1 week (around 11/20/2017) for 7-10d rob.   [place] BingPickens, Kimoni Pickerill, MD

## 2017-11-17 ENCOUNTER — Inpatient Hospital Stay: Payer: Medicaid Other | Admitting: Anesthesiology

## 2017-11-17 ENCOUNTER — Inpatient Hospital Stay
Admission: EM | Admit: 2017-11-17 | Discharge: 2017-11-19 | DRG: 805 | Disposition: A | Discharge status: Home / Self Care | Payer: Medicaid Other | Attending: Obstetrics and Gynecology | Admitting: Obstetrics and Gynecology

## 2017-11-17 ENCOUNTER — Other Ambulatory Visit: Payer: Self-pay

## 2017-11-17 DIAGNOSIS — Z3A39 39 weeks gestation of pregnancy: Secondary | ICD-10-CM

## 2017-11-17 DIAGNOSIS — O99820 Streptococcus B carrier state complicating pregnancy: Secondary | ICD-10-CM

## 2017-11-17 DIAGNOSIS — O368130 Decreased fetal movements, third trimester, not applicable or unspecified: Secondary | ICD-10-CM | POA: Diagnosis present

## 2017-11-17 DIAGNOSIS — B9689 Other specified bacterial agents as the cause of diseases classified elsewhere: Secondary | ICD-10-CM | POA: Diagnosis present

## 2017-11-17 DIAGNOSIS — A6000 Herpesviral infection of urogenital system, unspecified: Secondary | ICD-10-CM | POA: Diagnosis present

## 2017-11-17 DIAGNOSIS — O99824 Streptococcus B carrier state complicating childbirth: Secondary | ICD-10-CM | POA: Diagnosis present

## 2017-11-17 DIAGNOSIS — N760 Acute vaginitis: Secondary | ICD-10-CM

## 2017-11-17 DIAGNOSIS — O4292 Full-term premature rupture of membranes, unspecified as to length of time between rupture and onset of labor: Secondary | ICD-10-CM | POA: Diagnosis present

## 2017-11-17 DIAGNOSIS — Z3480 Encounter for supervision of other normal pregnancy, unspecified trimester: Secondary | ICD-10-CM

## 2017-11-17 DIAGNOSIS — O9832 Other infections with a predominantly sexual mode of transmission complicating childbirth: Secondary | ICD-10-CM | POA: Diagnosis present

## 2017-11-17 DIAGNOSIS — O09899 Supervision of other high risk pregnancies, unspecified trimester: Secondary | ICD-10-CM

## 2017-11-17 LAB — CBC
HEMATOCRIT: 31.4 % — AB (ref 35.0–47.0)
HEMOGLOBIN: 10 g/dL — AB (ref 12.0–16.0)
MCH: 28.4 pg (ref 26.0–34.0)
MCHC: 31.9 g/dL — ABNORMAL LOW (ref 32.0–36.0)
MCV: 89.2 fL (ref 80.0–100.0)
Platelets: 306 10*3/uL (ref 150–440)
RBC: 3.52 MIL/uL — ABNORMAL LOW (ref 3.80–5.20)
RDW: 15.8 % — ABNORMAL HIGH (ref 11.5–14.5)
WBC: 10.7 10*3/uL (ref 3.6–11.0)

## 2017-11-17 LAB — WET PREP, GENITAL
Sperm: NONE SEEN
TRICH WET PREP: NONE SEEN
Yeast Wet Prep HPF POC: NONE SEEN

## 2017-11-17 LAB — CHLAMYDIA/NGC RT PCR (ARMC ONLY)
Chlamydia Tr: NOT DETECTED
N gonorrhoeae: NOT DETECTED

## 2017-11-17 LAB — TYPE AND SCREEN
ABO/RH(D): O POS
ANTIBODY SCREEN: NEGATIVE

## 2017-11-17 LAB — ROM PLUS (ARMC ONLY): ROM PLUS: NEGATIVE

## 2017-11-17 MED ORDER — LIDOCAINE HCL (PF) 1 % IJ SOLN: 30.0000 mL | INTRAMUSCULAR | Status: DC | PRN

## 2017-11-17 MED ORDER — TERBUTALINE SULFATE 1 MG/ML IJ SOLN: 0.2500 mg | Freq: Once | INTRAMUSCULAR | Status: DC | PRN

## 2017-11-17 MED ORDER — OXYTOCIN 40 UNITS IN LACTATED RINGERS INFUSION - SIMPLE MED: 1.0000 m[IU]/min | INTRAVENOUS | Status: DC

## 2017-11-17 MED ORDER — METRONIDAZOLE 500 MG PO TABS
500.0000 mg | ORAL_TABLET | Freq: Two times a day (BID) | ORAL | Status: DC
  Administered 2017-11-17 – 2017-11-19 (×4): 500 mg via ORAL
  Filled 2017-11-17 (×5): qty 1

## 2017-11-17 MED ORDER — ONDANSETRON HCL 4 MG/2ML IJ SOLN
4.0000 mg | Freq: Four times a day (QID) | INTRAMUSCULAR | Status: DC | PRN
  Administered 2017-11-17: 4 mg via INTRAVENOUS
  Filled 2017-11-17: qty 2

## 2017-11-17 MED ORDER — AMMONIA AROMATIC IN INHA
RESPIRATORY_TRACT | Status: AC
  Filled 2017-11-17: qty 10

## 2017-11-17 MED ORDER — PENICILLIN G POTASSIUM 5000000 UNITS IJ SOLR
5.0000 10*6.[IU] | Freq: Once | INTRAVENOUS | Status: AC
  Administered 2017-11-17: 5 10*6.[IU] via INTRAVENOUS
  Filled 2017-11-17 (×2): qty 5

## 2017-11-17 MED ORDER — SOD CITRATE-CITRIC ACID 500-334 MG/5ML PO SOLN: 30.0000 mL | ORAL | Status: DC | PRN

## 2017-11-17 MED ORDER — OXYTOCIN BOLUS FROM INFUSION
500.0000 mL | Freq: Once | INTRAVENOUS | Status: AC
  Administered 2017-11-18: 500 mL via INTRAVENOUS

## 2017-11-17 MED ORDER — LACTATED RINGERS IV SOLN
INTRAVENOUS | Status: DC
  Administered 2017-11-17: via INTRAVENOUS

## 2017-11-17 MED ORDER — LACTATED RINGERS IV SOLN: INTRAVENOUS | Status: DC

## 2017-11-17 MED ORDER — ACETAMINOPHEN 325 MG PO TABS: 650.0000 mg | ORAL_TABLET | ORAL | Status: DC | PRN

## 2017-11-17 MED ORDER — LIDOCAINE HCL (PF) 1 % IJ SOLN
INTRAMUSCULAR | Status: AC
  Filled 2017-11-17: qty 30

## 2017-11-17 MED ORDER — OXYTOCIN 40 UNITS IN LACTATED RINGERS INFUSION - SIMPLE MED: 2.5000 [IU]/h | INTRAVENOUS | Status: DC

## 2017-11-17 MED ORDER — OXYCODONE-ACETAMINOPHEN 5-325 MG PO TABS
1.0000 | ORAL_TABLET | ORAL | Status: DC | PRN
  Administered 2017-11-17: 1 via ORAL
  Filled 2017-11-17: qty 1

## 2017-11-17 MED ORDER — CALCIUM CARBONATE ANTACID 500 MG PO CHEW: 2.0000 | CHEWABLE_TABLET | ORAL | Status: DC | PRN

## 2017-11-17 MED ORDER — LACTATED RINGERS IV SOLN: 500.0000 mL | INTRAVENOUS | Status: DC | PRN

## 2017-11-17 MED ORDER — ONDANSETRON HCL 4 MG/2ML IJ SOLN: 4.0000 mg | Freq: Four times a day (QID) | INTRAMUSCULAR | Status: DC | PRN

## 2017-11-17 MED ORDER — VALACYCLOVIR HCL 500 MG PO TABS
500.0000 mg | ORAL_TABLET | Freq: Two times a day (BID) | ORAL | Status: DC
  Administered 2017-11-17 – 2017-11-19 (×4): 500 mg via ORAL
  Filled 2017-11-17 (×5): qty 1

## 2017-11-17 MED ORDER — ZOLPIDEM TARTRATE 5 MG PO TABS: 5.0000 mg | ORAL_TABLET | Freq: Every evening | ORAL | Status: DC | PRN

## 2017-11-17 MED ORDER — FENTANYL 2.5 MCG/ML W/ROPIVACAINE 0.15% IN NS 100 ML EPIDURAL (ARMC)
EPIDURAL | Status: AC
Start: 2017-11-17 — End: 2017-11-18
  Filled 2017-11-17: qty 100

## 2017-11-17 MED ORDER — PENICILLIN G POT IN DEXTROSE 60000 UNIT/ML IV SOLN
3.0000 10*6.[IU] | INTRAVENOUS | Status: DC
  Administered 2017-11-17: 3 10*6.[IU] via INTRAVENOUS
  Filled 2017-11-17 (×5): qty 50

## 2017-11-17 MED ORDER — OXYTOCIN 10 UNIT/ML IJ SOLN
INTRAMUSCULAR | Status: AC
  Filled 2017-11-17: qty 2

## 2017-11-17 MED ORDER — LACTATED RINGERS IV SOLN
500.0000 mL | INTRAVENOUS | Status: DC | PRN
  Administered 2017-11-17: 500 mL via INTRAVENOUS

## 2017-11-17 MED ORDER — MISOPROSTOL 200 MCG PO TABS
ORAL_TABLET | ORAL | Status: AC
  Filled 2017-11-17: qty 4

## 2017-11-17 MED ORDER — OXYTOCIN 40 UNITS IN LACTATED RINGERS INFUSION - SIMPLE MED
INTRAVENOUS | Status: AC
  Administered 2017-11-18: 500 mL via INTRAVENOUS
  Filled 2017-11-17: qty 1000
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
## 2017-11-17 NOTE — Anesthesia Preprocedure Evaluation (Signed)
Anesthesia Evaluation  Patient identified by MRN, date of birth, ID band Patient awake    Reviewed: Allergy & Precautions, H&P , NPO status , Patient's Chart, lab work & pertinent test results  History of Anesthesia Complications Negative for: history of anesthetic complications  Airway Mallampati: II  TM Distance: >3 FB Neck ROM: full    Dental no notable dental hx. (+) Teeth Intact   Pulmonary neg pulmonary ROS,    Pulmonary exam normal breath sounds clear to auscultation       Cardiovascular negative cardio ROS Normal cardiovascular exam Rhythm:regular Rate:Normal     Neuro/Psych negative neurological ROS  negative psych ROS   GI/Hepatic negative GI ROS, Neg liver ROS,   Endo/Other  negative endocrine ROS  Renal/GU negative Renal ROS  negative genitourinary   Musculoskeletal   Abdominal   Peds  Hematology negative hematology ROS (+)   Anesthesia Other Findings   Reproductive/Obstetrics (+) Pregnancy                             Anesthesia Physical  Anesthesia Plan  ASA: II  Anesthesia Plan: Epidural   Post-op Pain Management:    Induction:   PONV Risk Score and Plan:   Airway Management Planned: Nasal Cannula  Additional Equipment:   Intra-op Plan:   Post-operative Plan:   Informed Consent: I have reviewed the patients History and Physical, chart, labs and discussed the procedure including the risks, benefits and alternatives for the proposed anesthesia with the patient or authorized representative who has indicated his/her understanding and acceptance.     Plan Discussed with: CRNA and Surgeon  Anesthesia Plan Comments:         Anesthesia Quick Evaluation
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
## 2017-11-18 ENCOUNTER — Other Ambulatory Visit: Payer: Self-pay

## 2017-11-18 MED ORDER — LACTATED RINGERS IV SOLN: 500.0000 mL | Freq: Once | INTRAVENOUS | Status: DC

## 2017-11-18 MED ORDER — SENNOSIDES-DOCUSATE SODIUM 8.6-50 MG PO TABS
2.0000 | ORAL_TABLET | ORAL | Status: DC
  Administered 2017-11-18 – 2017-11-19 (×2): 2 via ORAL
  Filled 2017-11-18 (×2): qty 2

## 2017-11-18 MED ORDER — DIPHENHYDRAMINE HCL 25 MG PO CAPS
25.0000 mg | ORAL_CAPSULE | Freq: Four times a day (QID) | ORAL | Status: DC | PRN
  Administered 2017-11-18: 25 mg via ORAL
  Filled 2017-11-18: qty 1

## 2017-11-18 MED ORDER — PHENYLEPHRINE 40 MCG/ML (10ML) SYRINGE FOR IV PUSH (FOR BLOOD PRESSURE SUPPORT)
80.0000 ug | PREFILLED_SYRINGE | INTRAVENOUS | Status: DC | PRN
  Filled 2017-11-18: qty 5

## 2017-11-18 MED ORDER — IBUPROFEN 600 MG PO TABS
ORAL_TABLET | ORAL | Status: AC
  Filled 2017-11-18: qty 1

## 2017-11-18 MED ORDER — DIPHENHYDRAMINE HCL 50 MG/ML IJ SOLN
12.5000 mg | INTRAMUSCULAR | Status: DC | PRN
  Administered 2017-11-18: 12.5 mg via INTRAVENOUS
  Filled 2017-11-18: qty 1

## 2017-11-18 MED ORDER — ACETAMINOPHEN 325 MG PO TABS
650.0000 mg | ORAL_TABLET | ORAL | Status: DC | PRN
  Administered 2017-11-18 – 2017-11-19 (×5): 650 mg via ORAL
  Filled 2017-11-18 (×5): qty 2

## 2017-11-18 MED ORDER — FLEET ENEMA 7-19 GM/118ML RE ENEM: 1.0000 | ENEMA | Freq: Every day | RECTAL | Status: DC | PRN

## 2017-11-18 MED ORDER — BUPIVACAINE HCL (PF) 0.25 % IJ SOLN
INTRAMUSCULAR | Status: DC | PRN
  Administered 2017-11-18: 10 mL via EPIDURAL

## 2017-11-18 MED ORDER — OXYCODONE HCL 5 MG PO TABS
ORAL_TABLET | ORAL | Status: AC
  Administered 2017-11-18: 5 mg
  Filled 2017-11-18: qty 1

## 2017-11-18 MED ORDER — SODIUM CHLORIDE 0.9 % IV SOLN: 250.0000 mL | INTRAVENOUS | Status: DC | PRN

## 2017-11-18 MED ORDER — LIDOCAINE HCL (PF) 1 % IJ SOLN
INTRAMUSCULAR | Status: DC | PRN
  Administered 2017-11-17: 3 mL

## 2017-11-18 MED ORDER — SODIUM CHLORIDE 0.9% FLUSH: 3.0000 mL | Freq: Two times a day (BID) | INTRAVENOUS | Status: DC

## 2017-11-18 MED ORDER — EPHEDRINE 5 MG/ML INJ
10.0000 mg | INTRAVENOUS | Status: DC | PRN
  Filled 2017-11-18: qty 2

## 2017-11-18 MED ORDER — BENZOCAINE-MENTHOL 20-0.5 % EX AERO
1.0000 "application " | INHALATION_SPRAY | CUTANEOUS | Status: DC | PRN
  Filled 2017-11-18: qty 56

## 2017-11-18 MED ORDER — ONDANSETRON HCL 4 MG PO TABS: 4.0000 mg | ORAL_TABLET | ORAL | Status: DC | PRN

## 2017-11-18 MED ORDER — TETANUS-DIPHTH-ACELL PERTUSSIS 5-2.5-18.5 LF-MCG/0.5 IM SUSP: 0.5000 mL | Freq: Once | INTRAMUSCULAR | Status: DC

## 2017-11-18 MED ORDER — PENICILLIN G POT IN DEXTROSE 60000 UNIT/ML IV SOLN
3.0000 10*6.[IU] | Freq: Once | INTRAVENOUS | Status: AC
  Administered 2017-11-18: 3 10*6.[IU] via INTRAVENOUS

## 2017-11-18 MED ORDER — ZOLPIDEM TARTRATE 5 MG PO TABS: 5.0000 mg | ORAL_TABLET | Freq: Every evening | ORAL | Status: DC | PRN

## 2017-11-18 MED ORDER — COCONUT OIL OIL: 1.0000 "application " | TOPICAL_OIL | Status: DC | PRN

## 2017-11-18 MED ORDER — ONDANSETRON HCL 4 MG/2ML IJ SOLN: 4.0000 mg | INTRAMUSCULAR | Status: DC | PRN

## 2017-11-18 MED ORDER — FENTANYL 2.5 MCG/ML W/ROPIVACAINE 0.15% IN NS 100 ML EPIDURAL (ARMC)
EPIDURAL | Status: DC | PRN
  Administered 2017-11-18: 12 mL/h via EPIDURAL

## 2017-11-18 MED ORDER — SIMETHICONE 80 MG PO CHEW: 80.0000 mg | CHEWABLE_TABLET | ORAL | Status: DC | PRN

## 2017-11-18 MED ORDER — IBUPROFEN 600 MG PO TABS
600.0000 mg | ORAL_TABLET | Freq: Four times a day (QID) | ORAL | Status: DC
  Administered 2017-11-18 – 2017-11-19 (×5): 600 mg via ORAL
  Filled 2017-11-18 (×4): qty 1

## 2017-11-18 MED ORDER — OXYCODONE HCL 5 MG PO TABS
5.0000 mg | ORAL_TABLET | ORAL | Status: DC | PRN
  Administered 2017-11-18 – 2017-11-19 (×5): 5 mg via ORAL
  Filled 2017-11-18 (×5): qty 1

## 2017-11-18 MED ORDER — FENTANYL 2.5 MCG/ML W/ROPIVACAINE 0.15% IN NS 100 ML EPIDURAL (ARMC): 12.0000 mL/h | EPIDURAL | Status: DC

## 2017-11-18 MED ORDER — LIDOCAINE-EPINEPHRINE (PF) 1.5 %-1:200000 IJ SOLN
INTRAMUSCULAR | Status: DC | PRN
  Administered 2017-11-18: 3 mL via PERINEURAL

## 2017-11-18 MED ORDER — DIBUCAINE 1 % RE OINT
1.0000 "application " | TOPICAL_OINTMENT | RECTAL | Status: DC | PRN
  Filled 2017-11-18: qty 28

## 2017-11-18 MED ORDER — SODIUM CHLORIDE 0.9% FLUSH: 3.0000 mL | INTRAVENOUS | Status: DC | PRN

## 2017-11-18 MED ORDER — BISACODYL 10 MG RE SUPP
10.0000 mg | Freq: Every day | RECTAL | Status: DC | PRN
  Filled 2017-11-18: qty 1

## 2017-11-18 MED ORDER — PRENATAL MULTIVITAMIN CH
1.0000 | ORAL_TABLET | Freq: Every day | ORAL | Status: DC
  Administered 2017-11-19: 1 via ORAL
  Filled 2017-11-18: qty 1

## 2017-11-18 MED ORDER — WITCH HAZEL-GLYCERIN EX PADS
1.0000 "application " | MEDICATED_PAD | CUTANEOUS | Status: DC | PRN
  Filled 2017-11-18: qty 100

## 2017-11-18 NOTE — Anesthesia Procedure Notes (Signed)
Epidural Patient location during procedure: OB Start time: 11/17/2017 11:58 PM End time: 11/18/2017 12:08 AM  Staffing Anesthesiologist: Yves Dillarroll, Tekoa Hamor, MD Performed: anesthesiologist   Preanesthetic Checklist Completed: patient identified, site marked, surgical consent, pre-op evaluation, timeout performed, IV checked, risks and benefits discussed and monitors and equipment checked  Epidural Patient position: sitting Prep: Betadine Patient monitoring: heart rate, continuous pulse ox and blood pressure Approach: midline Location: L3-L4 Injection technique: LOR air  Needle:  Needle type: Tuohy  Needle gauge: 17 G Needle length: 9 cm and 9 Catheter type: closed end flexible Catheter size: 19 Gauge Test dose: negative and 1.5% lidocaine with Epi 1:200 K  Assessment Sensory level: T8 Events: blood not aspirated, injection not painful, no injection resistance, negative IV test and no paresthesia  Additional Notes Time out called.  Patient placed in sitting position.  Back prepped and draped in sterile fashion.  A skin wheal was made in the L3-L4 interspace with 1% Lidocaine plain.  A 17G Tuohy needle was advanced to the epidural space by a loss of resistance technique.  The epidural catheter was advanced 3 cm and the TD was negative.  No blood or paresthesias.  The catheter was affixed to the back in sterile fashion. The patient tolerated the procedure well.Reason for block:procedure for pain
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
## 2017-11-18 NOTE — Anesthesia Postprocedure Evaluation (Signed)
Anesthesia Post Note  Patient: Jody Perez  Procedure(s) Performed: AN AD HOC LABOR EPIDURAL  Patient location during evaluation: Mother Baby Anesthesia Type: Epidural Level of consciousness: awake and alert and oriented Pain management: pain level controlled Vital Signs Assessment: post-procedure vital signs reviewed and stable Respiratory status: spontaneous breathing Cardiovascular status: blood pressure returned to baseline Postop Assessment: no headache, no backache and patient able to bend at knees Anesthetic complications: no Comments: Patient doing well.  Noted some itching presumably from narcotics per OB.  Site clean and dry.  Patient doing well.     Last Vitals:  Vitals:   11/18/17 0756 11/18/17 1152  BP: 121/74 124/70  Pulse: 78 71  Resp: 18 20  Temp: 36.8 C 36.7 C  SpO2: 100% 99%    Last Pain:  Vitals:   11/18/17 1152  TempSrc: Oral  PainSc:                  Rettie Laird
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
## 2017-11-18 NOTE — Progress Notes (Signed)
Provider Elvera Lennox(R. McVey, CNM) notified via phone at 0059 of prolong decel @ 0027-0031. Reported the interventions performed (position change, IV bolus, oxygen and cervical exam). Reported that decel was most likely due to patient's sudden drop in blood pressure following epidural. Reported that baby recovered and accelerations/variability noted. Provider agreed to withhold pitocin initiation since patient is making cervical change along with appropriate contraction pattern. Will continue to monitor and update provider as needed.
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
## 2017-11-18 NOTE — Progress Notes (Signed)
Labor Progress note  S: Pt comfortable with epidural now  O: Today's Vitals   11/18/17 0047 11/18/17 0052 11/18/17 0057 11/18/17 0230  BP: 115/73 121/77 111/70 (!) 97/48  Pulse: 81 85 80 89  Resp:    16  Temp:    98.4 F (36.9 C)  TempSrc:    Oral  SpO2: 100% 100%    Weight:      Height:      PainSc:       FHR: Cat II 130bpm mod variability, + accels, occasional variable and early decels noted. Overall reassuring.   Toco: UCs q482min  SVE: last per RN, 6/90/0 at 0036  Continues to leak clear fluid.   A: active labor PROM 10+hrs GBS Pos  P: Continue to monitor FHR closely, making progress without interventions at this time, Consider Pitocin if UCs space out.  Continue PCN q4h for GBS prophy, now s/p 3 doses Anticipate SVD.   McVey, REBECCA A, CNM 11/18/17 3:08 AM
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
## 2017-11-19 LAB — CBC
HCT: 29 % — ABNORMAL LOW (ref 35.0–47.0)
HEMOGLOBIN: 9.1 g/dL — AB (ref 12.0–16.0)
MCH: 28 pg (ref 26.0–34.0)
MCHC: 31.5 g/dL — ABNORMAL LOW (ref 32.0–36.0)
MCV: 88.8 fL (ref 80.0–100.0)
PLATELETS: 263 10*3/uL (ref 150–440)
RBC: 3.27 MIL/uL — AB (ref 3.80–5.20)
RDW: 16.2 % — ABNORMAL HIGH (ref 11.5–14.5)
WBC: 11.4 10*3/uL — AB (ref 3.6–11.0)

## 2017-11-19 MED ORDER — DOCUSATE SODIUM 100 MG PO CAPS: 100.0000 mg | ORAL_CAPSULE | Freq: Two times a day (BID) | ORAL | 0 refills | Status: AC

## 2017-11-19 MED ORDER — FERROUS SULFATE 325 (65 FE) MG PO TABS: 325.0000 mg | ORAL_TABLET | Freq: Every day | ORAL | 1 refills | Status: DC

## 2017-11-19 MED ORDER — OXYCODONE-ACETAMINOPHEN 5-325 MG PO TABS: 1.0000 | ORAL_TABLET | Freq: Four times a day (QID) | ORAL | 0 refills | Status: DC | PRN

## 2017-11-19 MED ORDER — IBUPROFEN 800 MG PO TABS: 800.0000 mg | ORAL_TABLET | Freq: Three times a day (TID) | ORAL | 1 refills | Status: DC | PRN

## 2017-11-19 NOTE — Discharge Instructions (Signed)
[facility] Before your baby arrives it is important to:  Have all of the supplies that you will need to care for your baby.  Know where to go if there is an emergency.  Discuss the baby's arrival with other family members.  What supplies will I need?  It is recommended that you have the following supplies: Large Items  Crib.  Crib mattress.  Rear-facing infant car seat. If possible, have a trained professional check to make sure that it is installed correctly.  Feeding  6-8 bottles that are 4-5 oz in size.  6-8 nipples.  Bottle brush.  Sterilizer, or a large pan or kettle with a lid.  A way to boil and cool water.  If you will be breastfeeding: ? Breast pump. ? Nipple cream. ? Nursing bra. ? Breast pads. ? Breast shields.  If you will be formula feeding: ? Formula. ? Measuring cups. ? Measuring spoons.  Bathing  Mild baby soap and baby shampoo.  Petroleum jelly.  Soft cloth towel and washcloth.  Hooded towel.  Cotton balls.  Bath basin.  Other Supplies  Rectal thermometer.  Bulb syringe.  Baby wipes or washcloths for diaper changes.  Diaper bag.  Changing pad.  Clothing, including one-piece outfits and pajamas.  Baby nail clippers.  Receiving blankets.  Mattress pad and sheets for the crib.  Night-light for the babys room.  Baby monitor.  2 or 3 pacifiers.  Either 24-36 cloth diapers and waterproof diaper covers or a box of disposable diapers. You may need to use as many as 10-12 diapers per day.  How do I prepare for an emergency? Prepare for an emergency by:  Knowing how to get to the nearest hospital.  Listing the phone numbers of your baby's health care providers near your home phone and in your cell phone.  How do I prepare my family?  Decide how to handle visitors.  If you have other children: ? Talk with them about the baby coming home. Ask them how they feel about it. ? Read a book together  about being a new big brother or sister. ? Find ways to let them help you prepare for the new baby. ? Have someone ready to care for them while you are in the hospital. This information is not intended to replace advice given to you by your health care provider. Make sure you discuss any questions you have with your health care provider. Document Released: 10/26/2008 Document Revised: 04/20/2016 Document Reviewed: 10/21/2014 Elsevier Interactive Patient Education  2018 Elsevier Inc. Vaginal Delivery, Care After Refer to this sheet in the next few weeks. These instructions provide you with information about caring for yourself after vaginal delivery. Your health care provider may also give you more specific instructions. Your treatment has been planned according to current medical practices, but problems sometimes occur. Call your health care provider if you have any problems or questions. What can I expect after the procedure? After vaginal delivery, it is common to have:  Some bleeding from your vagina.  Soreness in your abdomen, your vagina, and the area of skin between your vaginal opening and your anus (perineum).  Pelvic cramps.  Fatigue.  Follow these instructions at home: Medicines  Take over-the-counter and prescription medicines only as told by your health care provider.  If you were prescribed an antibiotic medicine, take it as told by your health care provider. Do not stop taking the antibiotic until it is finished. Driving   Do  not drive or operate heavy machinery while taking prescription pain medicine.  Do not drive for 24 hours if you received a sedative. Lifestyle  Do not drink alcohol. This is especially important if you are breastfeeding or taking medicine to relieve pain.  Do not use tobacco products, including cigarettes, chewing tobacco, or e-cigarettes. If you need help quitting, ask your health care provider. Eating and drinking  Drink at least 8  eight-ounce glasses of water every day unless you are told not to by your health care provider. If you choose to breastfeed your baby, you may need to drink more water than this.  Eat high-fiber foods every day. These foods may help prevent or relieve constipation. High-fiber foods include: ? Whole grain cereals and breads. ? Brown rice. ? Beans. ? Fresh fruits and vegetables. Activity  Return to your normal activities as told by your health care provider. Ask your health care provider what activities are safe for you.  Rest as much as possible. Try to rest or take a nap when your baby is sleeping.  Do not lift anything that is heavier than your baby or 10 lb (4.5 kg) until your health care provider says that it is safe.  Talk with your health care provider about when you can engage in sexual activity. This may depend on your: ? Risk of infection. ? Rate of healing. ? Comfort and desire to engage in sexual activity. Vaginal Care  If you have an episiotomy or a vaginal tear, check the area every day for signs of infection. Check for: ? More redness, swelling, or pain. ? More fluid or blood. ? Warmth. ? Pus or a bad smell.  Do not use tampons or douches until your health care provider says this is safe.  Watch for any blood clots that may pass from your vagina. These may look like clumps of dark red, brown, or black discharge. General instructions  Keep your perineum clean and dry as told by your health care provider.  Wear loose, comfortable clothing.  Wipe from front to back when you use the toilet.  Ask your health care provider if you can shower or take a bath. If you had an episiotomy or a perineal tear during labor and delivery, your health care provider may tell you not to take baths for a certain length of time.  Wear a bra that supports your breasts and fits you well.  If possible, have someone help you with household activities and help care for your baby for at  least a few days after you leave the hospital.  Keep all follow-up visits for you and your baby as told by your health care provider. This is important. Contact a health care provider if:  You have: ? Vaginal discharge that has a bad smell. ? Difficulty urinating. ? Pain when urinating. ? A sudden increase or decrease in the frequency of your bowel movements. ? More redness, swelling, or pain around your episiotomy or vaginal tear. ? More fluid or blood coming from your episiotomy or vaginal tear. ? Pus or a bad smell coming from your episiotomy or vaginal tear. ? A fever. ? A rash. ? Little or no interest in activities you used to enjoy. ? Questions about caring for yourself or your baby.  Your episiotomy or vaginal tear feels warm to the touch.  Your episiotomy or vaginal tear is separating or does not appear to be healing.  Your breasts are painful, hard, or turn red.  You feel unusually sad or worried.  You feel nauseous or you vomit.  You pass large blood clots from your vagina. If you pass a blood clot from your vagina, save it to show to your health care provider. Do not flush blood clots down the toilet without having your health care provider look at them.  You urinate more than usual.  You are dizzy or light-headed.  You have not breastfed at all and you have not had a menstrual period for 12 weeks after delivery.  You have stopped breastfeeding and you have not had a menstrual period for 12 weeks after you stopped breastfeeding. Get help right away if:  You have: ? Pain that does not go away or does not get better with medicine. ? Chest pain. ? Difficulty breathing. ? Blurred vision or spots in your vision. ? Thoughts about hurting yourself or your baby.  You develop pain in your abdomen or in one of your legs.  You develop a severe headache.  You faint.  You bleed from your vagina so much that you fill two sanitary pads in one hour. This information is  not intended to replace advice given to you by your health care provider. Make sure you discuss any questions you have with your health care provider. Document Released: 11/10/2000 Document Revised: 04/26/2016 Document Reviewed: 11/28/2015 Elsevier Interactive Patient Education  2018 [employer].

## 2017-11-19 NOTE — Progress Notes (Signed)
Patient discharged home with infant. Discharge instructions, prescriptions and follow up appointment given to and reviewed with patient. Patient verbalized understanding. Patient wheeled out with infant by RN 

## 2017-11-20 LAB — RPR: RPR Ser Ql: NONREACTIVE

## 2017-11-21 ENCOUNTER — Encounter: Payer: Medicaid Other | Admitting: Obstetrics and Gynecology

## 2017-12-11 ENCOUNTER — Encounter: Payer: Self-pay | Admitting: Obstetrics and Gynecology

## 2017-12-11 ENCOUNTER — Ambulatory Visit (INDEPENDENT_AMBULATORY_CARE_PROVIDER_SITE_OTHER): Payer: Medicaid Other | Admitting: Obstetrics and Gynecology

## 2017-12-11 VITALS — BP 126/73 | HR 57 | Wt 157.0 lb

## 2017-12-11 DIAGNOSIS — R29898 Other symptoms and signs involving the musculoskeletal system: Secondary | ICD-10-CM

## 2017-12-11 NOTE — Progress Notes (Signed)
Obstetrics and Gynecology Visit Established Patient Evaluation  Appointment Date: 12/13/2017  Primary Care Provider: None  Referring Provider: self Chief Complaint: right leg weakness and giving out  History of Present Illness:  Jody Perez is a 27 y.o. with the above CC. Patient states that she's had some issues with right leg weakness and feeling like it gives out sometimes that pre dates her pregnancy by at least a few months. No change in s/s and cant remember any sort of preceding event that caused it.  States that this all started several months before her pregnancy. Right thigh anterior and lateral pain and gives out when she walks. No accidents, trauma and wasn't working at the time this occured  Review of Systems:  as noted in the History of Present Illness.   Medications and Allergies: reviewed  Physical Exam:  BP 126/73   Pulse (!) 57   Wt 157 lb (71.2 kg)   BMI 26.95 kg/m  Body mass index is 26.95 kg/m. General appearance: Well nourished, well developed female in no acute distress.  Neuro: normal sensation in b/l LE extremities and 5/5 strength. 2+ patellar reflexes. No abnormalities in gait   Assessment: pt stable  Plan:  1. Right leg weakness Recommend getting a walker at the pharmacy for added support. Pt states she doesn't have a PCP. Will refer to neuro   RTC: prn  Cornelia Copaharlie Savon Bordonaro, Jr MD Attending [facility](Faculty Practice)

## 2017-12-13 ENCOUNTER — Ambulatory Visit: Payer: Medicaid Other | Admitting: Neurology

## 2017-12-14 ENCOUNTER — Encounter: Payer: Self-pay | Admitting: Neurology

## 2017-12-26 ENCOUNTER — Encounter: Payer: Self-pay | Admitting: Obstetrics and Gynecology

## 2017-12-26 ENCOUNTER — Ambulatory Visit: Payer: Medicaid Other | Admitting: Obstetrics and Gynecology

## 2017-12-26 NOTE — Progress Notes (Deleted)
Subjective:     Jody Perez is a 27 y.o. female who presents for a postpartum visit. She is five weeks postpartum following a delivery 11/18/2017. I have fully reviewed the prenatal and intrapartum course. The delivery was at 39 gestational weeks. Outcome: vaginal. Anesthesia: epidual. Postpartum course has been . Baby's course has been uncomplicated. Baby is feeding by {breast/bottle:69}. Bleeding {vag bleed:12292}. Bowel function is {normal:32111}. Bladder function is {normal:32111}. Patient {is/is not:9024} sexually active. Contraception method is {contraceptive method:5051}. Postpartum depression screening: {neg default:13464::"negative"}.  {Common ambulatory SmartLinks:19316}  Review of Systems {ros; complete:30496}   Objective:    There were no vitals taken for this visit.  General:  {gen appearance:16600}   Breasts:  {breast exam:1202::"inspection negative, no nipple discharge or bleeding, no masses or nodularity palpable"}  Lungs: {lung exam:16931}  Heart:  {heart exam:5510}  Abdomen: {abdomen exam:16834}   Vulva:  {labia exam:12198}  Vagina: {vagina exam:12200}  Cervix:  {cervix exam:14595}  Corpus: {uterus exam:12215}  Adnexa:  {adnexa exam:12223}  Rectal Exam: {rectal/vaginal exam:12274}        Assessment:    *** postpartum exam. Pap smear {done:10129} at today's visit.   Plan:    1. Contraception: {method:5051} 2. *** 3. Follow up in: {1-10:13787} {time; units:19136} or as needed.

## 2017-12-27 NOTE — Progress Notes (Signed)
Patient did not keep postpartum appointment for 12/26/2017.  Cornelia Copaharlie Inas Avena, Jr MD Attending [facility](Faculty Practice)

## 2018-01-23 ENCOUNTER — Ambulatory Visit: Payer: Medicaid Other | Admitting: Student

## 2018-01-30 LAB — HM PAP SMEAR: HM Pap smear: NEGATIVE

## 2018-03-27 ENCOUNTER — Ambulatory Visit: Payer: Medicaid Other | Admitting: Family Medicine

## 2018-03-27 ENCOUNTER — Encounter: Payer: Self-pay | Admitting: Family Medicine

## 2018-03-27 VITALS — BP 112/76 | HR 77 | Temp 98.0°F | Resp 16 | Ht 64.0 in | Wt 153.0 lb

## 2018-03-27 DIAGNOSIS — N898 Other specified noninflammatory disorders of vagina: Secondary | ICD-10-CM | POA: Diagnosis not present

## 2018-03-27 DIAGNOSIS — Z0000 Encounter for general adult medical examination without abnormal findings: Secondary | ICD-10-CM | POA: Diagnosis not present

## 2018-03-27 DIAGNOSIS — Z20828 Contact with and (suspected) exposure to other viral communicable diseases: Secondary | ICD-10-CM | POA: Diagnosis not present

## 2018-03-27 DIAGNOSIS — D649 Anemia, unspecified: Secondary | ICD-10-CM | POA: Diagnosis not present

## 2018-03-27 DIAGNOSIS — Z202 Contact with and (suspected) exposure to infections with a predominantly sexual mode of transmission: Secondary | ICD-10-CM | POA: Diagnosis not present

## 2018-03-27 DIAGNOSIS — Z13220 Encounter for screening for lipoid disorders: Secondary | ICD-10-CM

## 2018-03-27 DIAGNOSIS — Z862 Personal history of diseases of the blood and blood-forming organs and certain disorders involving the immune mechanism: Secondary | ICD-10-CM | POA: Insufficient documentation

## 2018-03-27 NOTE — Progress Notes (Signed)
Patient: Jody Perez, Female    DOB: 01/11/1991, 27 y.o.   MRN: [id number] Visit Date: 03/27/2018  Today's Provider: [name], MD   I, Joslyn Hy, CMA, am acting as scribe for [name], MD.  Chief Complaint  Patient presents with  . Establish Care   Subjective:    Establish Care Jody Perez is a 27 y.o. female who presents today to establish care. She feels well. She reports exercising none. She reports she is sleeping well.  Thinks that last Depo shot was about 2 months.  Has been getting them at [facility].  Vaginal discharge x3 days. Malodorous.  Similar to BV in the past.  White in color.  Vaginal irritaiton.  Thinks it may be due to change in soap. Has h/o chlamydia, trichomonas, BV.  Not currently sexually active.  S/p NSVD 10/2017.   -----------------------------------------------------------------   Review of Systems  Constitutional: Negative.   HENT: Positive for sneezing. Negative for congestion, dental problem, drooling, ear discharge, ear pain, facial swelling, hearing loss, mouth sores, nosebleeds, postnasal drip, rhinorrhea, sinus pressure, sinus pain, sore throat, tinnitus, trouble swallowing and voice change.   Eyes: Negative.   Respiratory: Negative.   Cardiovascular: Negative.   Gastrointestinal: Negative.   Endocrine: Negative.   Genitourinary: Positive for vaginal discharge. Negative for decreased urine volume, difficulty urinating, dyspareunia, dysuria, enuresis, flank pain, frequency, genital sores, hematuria, menstrual problem, pelvic pain, urgency, vaginal bleeding and vaginal pain.  Musculoskeletal: Negative.   Skin: Negative.   Allergic/Immunologic: Positive for environmental allergies. Negative for food allergies and immunocompromised state.  Hematological: Negative.   Psychiatric/Behavioral: Negative.     Social History      She  reports that she has never smoked. She has never used smokeless tobacco. She  reports that she does not drink alcohol or use drugs.       Social History   Socioeconomic History  . Marital status: Single    Spouse name: Not on file  . Number of children: 3  . Years of education: 4  . Highest education level: High school graduate  Occupational History  . Not on file  Social Needs  . Financial resource strain: Not on file  . Food insecurity:    Worry: Not on file    Inability: Not on file  . Transportation needs:    Medical: Not on file    Non-medical: Not on file  Tobacco Use  . Smoking status: Never Smoker  . Smokeless tobacco: Never Used  Substance and Sexual Activity  . Alcohol use: No    Comment: socially  . Drug use: No  . Sexual activity: Yes    Birth control/protection: Injection  Lifestyle  . Physical activity:    Days per week: Not on file    Minutes per session: Not on file  . Stress: Not on file  Relationships  . Social connections:    Talks on phone: Not on file    Gets together: Not on file    Attends religious service: Not on file    Active member of club or organization: Not on file    Attends meetings of clubs or organizations: Not on file    Relationship status: Not on file  Other Topics Concern  . Not on file  Social History Narrative  . Not on file    Past Medical History:  Diagnosis Date  . BV (bacterial vaginosis)   . Chlamydia   . Herpes genitalis in women   .  HSV infection   . Miscarriage   . Trichomonas   . UTI (lower urinary tract infection)      Patient Active Problem List   Diagnosis Date Noted  . Right leg weakness 12/11/2017  . Indication for care in labor and delivery, antepartum 11/17/2017  . Bacterial vaginosis 11/17/2017  . Full-term premature rupture of membranes 11/17/2017  . Supervision of other normal pregnancy, antepartum 05/07/2017  . Short interval between pregnancies affecting pregnancy, antepartum 05/07/2017  . Group B Streptococcus carrier, +RV culture, currently pregnant 11/17/2016    . History of genital HSV 01/28/2013    Past Surgical History:  Procedure Laterality Date  . NO PAST SURGERIES      Family History        Family Status  Relation Name Status  . Mother  Alive  . Brother  Alive  . Father  Alive  . MGM  Deceased  . Brother  Alive  . Neg Hx  (Not Specified)        Her family history includes Breast cancer in her maternal grandmother; Healthy in her brother, brother, and father; Hypertension in her mother. There is no history of Colon cancer, Ovarian cancer, or Cervical cancer.      No Known Allergies  No current outpatient medications on file.   Patient Care Team: Erasmo Downer, MD as PCP - General (Family Medicine)      Objective:   Vitals: BP 112/76 (BP Location: Left Arm, Patient Position: Sitting, Cuff Size: Normal)   Pulse 77   Temp 98 F (36.7 C) (Oral)   Resp 16   Ht  (1.626 m)   Wt 153 lb (69.4 kg)   SpO2 99%   Breastfeeding? No   BMI 26.26 kg/m    Vitals:   03/27/18 1102  BP: 112/76  Pulse: 77  Resp: 16  Temp: 98 F (36.7 C)  TempSrc: Oral  SpO2: 99%  Weight: 153 lb (69.4 kg)  Height:  (1.626 m)     Physical Exam  Constitutional: She is oriented to person, place, and time. She appears well-developed and well-nourished. No distress.  HENT:  Head: Normocephalic and atraumatic.  Right Ear: External ear normal.  Left Ear: External ear normal.  Nose: Nose normal.  Mouth/Throat: Oropharynx is clear and moist.  Eyes: Pupils are equal, round, and reactive to light. Conjunctivae and EOM are normal. No scleral icterus.  Neck: Neck supple. No thyromegaly present.  Cardiovascular: Normal rate, regular rhythm, normal heart sounds and intact distal pulses.  No murmur heard. Pulmonary/Chest: Effort normal and breath sounds normal. No respiratory distress. She has no wheezes. She has no rales.  Abdominal: Soft. Bowel sounds are normal. She exhibits no distension. There is no tenderness. There is no rebound  and no guarding.  Genitourinary:  Genitourinary Comments: GYN:  External genitalia within normal limits.  Vaginal mucosa pink, moist, normal rugae.  Nonfriable cervix without lesions, + discharge, no bleeding noted on speculum exam.   Musculoskeletal: She exhibits no edema or deformity.  Lymphadenopathy:    She has no cervical adenopathy.  Neurological: She is alert and oriented to person, place, and time.  Skin: Skin is warm and dry. Capillary refill takes less than 2 seconds. No rash noted.  Psychiatric: She has a normal mood and affect. Her behavior is normal.  Vitals reviewed.    Depression Screen PHQ 2/9 Scores 03/27/2018  PHQ - 2 Score 0      Assessment & Plan:  Routine Health Maintenance and Physical Exam  Exercise Activities and Dietary recommendations Goals    None      Immunization History  Administered Date(s) Administered  . Tdap 07/30/2013, 09/27/2016, 09/18/2017    Health Maintenance  Topic Date Due  . INFLUENZA VACCINE  06/27/2018  . PAP SMEAR  05/04/2019  . TETANUS/TDAP  09/19/2027  . HIV Screening  Completed     Discussed health benefits of physical activity, and encouraged her to engage in regular exercise appropriate for her age and condition.    --------------------------------------------------------------------  Problem List Items Addressed This Visit      Other   Anemia    H/o anemia post-partum Will recheck CBC      Relevant Orders   CBC w/Diff/Platelet    Other Visit Diagnoses    Encounter for annual physical exam    -  Primary   Relevant Orders   Lipid panel   Comprehensive metabolic panel   CBC w/Diff/Platelet   Vaginal discharge       Relevant Orders   NuSwab Vaginitis Plus (VG+)   Screening, lipid       Relevant Orders   Lipid panel   Comprehensive metabolic panel   Venereal disease contact       Relevant Orders   RPR   Viral disease exposure       Relevant Orders   HIV antibody (with reflex)       Return  in about 1 year (around 03/28/2019) for physical.  Sooner for Depo shots or if she decides to try IUD.   The entirety of the information documented in the History of Present Illness, Review of Systems and Physical Exam were personally obtained by me. Portions of this information were initially documented by Irving Burton Ratchford, CMA and reviewed by me for thoroughness and accuracy.    Erasmo Downer, MD, MPH [facility] 03/27/2018 11:31 AM

## 2018-03-27 NOTE — Assessment & Plan Note (Signed)
H/o anemia post-partum Will recheck CBC

## 2018-03-27 NOTE — Patient Instructions (Signed)
Preventive Care 18-39 Years, Female Preventive care refers to lifestyle choices and visits with your health care provider that can promote health and wellness. What does preventive care include?  A yearly physical exam. This is also called an annual well check.  Dental exams once or twice a year.  Routine eye exams. Ask your health care provider how often you should have your eyes checked.  Personal lifestyle choices, including: ? Daily care of your teeth and gums. ? Regular physical activity. ? Eating a healthy diet. ? Avoiding tobacco and drug use. ? Limiting alcohol use. ? Practicing safe sex. ? Taking vitamin and mineral supplements as recommended by your health care provider. What happens during an annual well check? The services and screenings done by your health care provider during your annual well check will depend on your age, overall health, lifestyle risk factors, and family history of disease. Counseling Your health care provider may ask you questions about your:  Alcohol use.  Tobacco use.  Drug use.  Emotional well-being.  Home and relationship well-being.  Sexual activity.  Eating habits.  Work and work Statistician.  Method of birth control.  Menstrual cycle.  Pregnancy history.  Screening You may have the following tests or measurements:  Height, weight, and BMI.  Diabetes screening. This is done by checking your blood sugar (glucose) after you have not eaten for a while (fasting).  Blood pressure.  Lipid and cholesterol levels. These may be checked every 5 years starting at age 66.  Skin check.  Hepatitis C blood test.  Hepatitis B blood test.  Sexually transmitted disease (STD) testing.  BRCA-related cancer screening. This may be done if you have a family history of breast, ovarian, tubal, or peritoneal cancers.  Pelvic exam and Pap test. This may be done every 3 years starting at age 40. Starting at age 59, this may be done every 5  years if you have a Pap test in combination with an HPV test.  Discuss your test results, treatment options, and if necessary, the need for more tests with your health care provider. Vaccines Your health care provider may recommend certain vaccines, such as:  Influenza vaccine. This is recommended every year.  Tetanus, diphtheria, and acellular pertussis (Tdap, Td) vaccine. You may need a Td booster every 10 years.  Varicella vaccine. You may need this if you have not been vaccinated.  HPV vaccine. If you are 69 or younger, you may need three doses over 6 months.  Measles, mumps, and rubella (MMR) vaccine. You may need at least one dose of MMR. You may also need a second dose.  Pneumococcal 13-valent conjugate (PCV13) vaccine. You may need this if you have certain conditions and were not previously vaccinated.  Pneumococcal polysaccharide (PPSV23) vaccine. You may need one or two doses if you smoke cigarettes or if you have certain conditions.  Meningococcal vaccine. One dose is recommended if you are age 27-21 years and a first-year college student living in a residence hall, or if you have one of several medical conditions. You may also need additional booster doses.  Hepatitis A vaccine. You may need this if you have certain conditions or if you travel or work in places where you may be exposed to hepatitis A.  Hepatitis B vaccine. You may need this if you have certain conditions or if you travel or work in places where you may be exposed to hepatitis B.  Haemophilus influenzae type b (Hib) vaccine. You may need this if  you have certain risk factors.  Talk to your health care provider about which screenings and vaccines you need and how often you need them. This information is not intended to replace advice given to you by your health care provider. Make sure you discuss any questions you have with your health care provider. Document Released: 01/09/2002 Document Revised: 08/02/2016  Document Reviewed: 09/14/2015 Elsevier Interactive Patient Education  [employer].

## 2018-03-28 ENCOUNTER — Telehealth: Payer: Self-pay

## 2018-03-28 NOTE — Telephone Encounter (Signed)
-----   Message from Erasmo Downer, MD sent at 03/28/2018  1:48 PM EDT ----- Regarding: Depo schedule [facility] Last Depo shot was given 01/30/18.  Shots can be given q11-13 weeks. She can schedule her next injection on this schedule if she desires to continue with Depo shots.  She was also considering other contraceptive options.  Thanks, Dr B

## 2018-03-28 NOTE — Telephone Encounter (Signed)
This has been scheduled

## 2018-03-28 NOTE — Telephone Encounter (Signed)
Tried calling pt. No answer and VM was not set up. She will need an injection appt between May 22 and June 5.

## 2018-04-01 ENCOUNTER — Other Ambulatory Visit: Payer: Self-pay | Admitting: Family Medicine

## 2018-04-01 ENCOUNTER — Telehealth: Payer: Self-pay

## 2018-04-01 LAB — NUSWAB VAGINITIS PLUS (VG+)
Atopobium vaginae: HIGH Score — AB
BVAB 2: HIGH Score — AB
CHLAMYDIA TRACHOMATIS, NAA: NEGATIVE
Candida albicans, NAA: NEGATIVE
Candida glabrata, NAA: NEGATIVE
MEGASPHAERA 1: HIGH {score} — AB
Neisseria gonorrhoeae, NAA: NEGATIVE
Trich vag by NAA: NEGATIVE

## 2018-04-01 MED ORDER — FLUCONAZOLE 150 MG PO TABS: 150.0000 mg | ORAL_TABLET | Freq: Once | ORAL | 0 refills | Status: AC

## 2018-04-01 MED ORDER — METRONIDAZOLE 500 MG PO TABS: 500.0000 mg | ORAL_TABLET | Freq: Two times a day (BID) | ORAL | 0 refills | Status: DC

## 2018-04-01 NOTE — Telephone Encounter (Signed)
-----   Message from Erasmo Downer, MD sent at 04/01/2018  8:55 AM EDT ----- Vaginal swab positive for bacterial vaginosis.  Negative for yeast, trichomonas, chlamydia, gonorrhea.  Treatment for bacterial vaginosis was sent to the pharmacy.  This is a medicine called Flagyl.  She should not drink alcohol while taking this medication.  Erasmo Downer, MD, MPH [facility] 04/01/2018 8:54 AM

## 2018-04-01 NOTE — Telephone Encounter (Signed)
Advised patient of results. Patient reports that she usually has a yeast infection after taking Flagyl. Ok to send into the pharmacy per Dr. Leonard Schwartz.

## 2018-04-19 ENCOUNTER — Ambulatory Visit: Payer: Medicaid Other | Admitting: Family Medicine

## 2018-06-06 ENCOUNTER — Encounter: Payer: Self-pay | Admitting: Emergency Medicine

## 2018-06-06 ENCOUNTER — Emergency Department
Admission: EM | Admit: 2018-06-06 | Discharge: 2018-06-06 | Disposition: A | Discharge status: Home / Self Care | Payer: Medicaid Other | Attending: Emergency Medicine | Admitting: Emergency Medicine

## 2018-06-06 ENCOUNTER — Other Ambulatory Visit: Payer: Self-pay

## 2018-06-06 DIAGNOSIS — N760 Acute vaginitis: Secondary | ICD-10-CM | POA: Insufficient documentation

## 2018-06-06 DIAGNOSIS — B9689 Other specified bacterial agents as the cause of diseases classified elsewhere: Secondary | ICD-10-CM

## 2018-06-06 LAB — WET PREP, GENITAL
SPERM: NONE SEEN
TRICH WET PREP: NONE SEEN
Yeast Wet Prep HPF POC: NONE SEEN

## 2018-06-06 LAB — URINALYSIS, COMPLETE (UACMP) WITH MICROSCOPIC
BILIRUBIN URINE: NEGATIVE
Bacteria, UA: NONE SEEN
GLUCOSE, UA: NEGATIVE mg/dL
KETONES UR: NEGATIVE mg/dL
NITRITE: NEGATIVE
Protein, ur: 100 mg/dL — AB
Specific Gravity, Urine: 1.023 (ref 1.005–1.030)
pH: 6 (ref 5.0–8.0)

## 2018-06-06 LAB — CHLAMYDIA/NGC RT PCR (ARMC ONLY)
CHLAMYDIA TR: NOT DETECTED
N gonorrhoeae: NOT DETECTED

## 2018-06-06 LAB — POCT PREGNANCY, URINE: Preg Test, Ur: NEGATIVE

## 2018-06-06 MED ORDER — CEPHALEXIN 500 MG PO CAPS: 500.0000 mg | ORAL_CAPSULE | Freq: Two times a day (BID) | ORAL | 0 refills | Status: AC

## 2018-06-06 MED ORDER — METRONIDAZOLE 500 MG PO TABS: 500.0000 mg | ORAL_TABLET | Freq: Two times a day (BID) | ORAL | 0 refills | Status: AC

## 2018-06-06 NOTE — ED Triage Notes (Addendum)
Patient ambulatory to triage with steady gait, without difficulty or distress noted; pt st "an hour ago I started smelling like fish"; pt denies vag discharge, st "it hurts when I pee"

## 2018-06-06 NOTE — ED Provider Notes (Signed)
[facility] Emergency Department Provider Note __   First MD Initiated Contact with Patient 06/06/18 [phone]     (approximate)  I have reviewed the triage vital signs and the nursing notes.   HISTORY  Chief Complaint Vaginitis    HPI Jody Perez is a 27 y.o. female with below list of previous medical conditions presents to the emergency department with vaginal discharge with "fishy odor" noted.  Patient also admits to dysuria.  patient states last sexual encounter was 2 days ago which was unprotected.  Past Medical History:  Diagnosis Date  . BV (bacterial vaginosis)   . Chlamydia   . Herpes genitalis in women   . HSV infection   . Miscarriage   . Trichomonas   . UTI (lower urinary tract infection)     Patient Active Problem List   Diagnosis Date Noted  . Anemia 03/27/2018  . Bacterial vaginosis 11/17/2017  . History of genital HSV 01/28/2013    Past Surgical History:  Procedure Laterality Date  . NO PAST SURGERIES      Prior to Admission medications   Medication Sig Start Date End Date Taking? Authorizing Provider  metroNIDAZOLE (FLAGYL) 500 MG tablet Take 1 tablet (500 mg total) by mouth 2 (two) times daily. 04/01/18   Erasmo DownerBacigalupo, Angela M, MD    Allergies Patient has no known allergies.  Family History  Problem Relation Age of Onset  . Hypertension Mother   . Healthy Brother   . Healthy Father   . Breast cancer Maternal Grandmother   . Healthy Brother   . Colon cancer Neg Hx   . Ovarian cancer Neg Hx   . Cervical cancer Neg Hx     Social History Social History   Tobacco Use  . Smoking status: Never Smoker  . Smokeless tobacco: Never Used  Substance Use Topics  . Alcohol use: No    Comment: socially  . Drug use: No    Review of Systems Constitutional: No fever/chills Eyes: No visual changes. ENT: No sore throat. Cardiovascular: Denies chest pain. Respiratory: Denies shortness of breath. Gastrointestinal: No  abdominal pain.  No nausea, no vomiting.  No diarrhea.  No constipation. Genitourinary: Positive for vaginal discharge and dysuria Musculoskeletal: Negative for neck pain.  Negative for back pain. Integumentary: Negative for rash. Neurological: Negative for headaches, focal weakness or numbness.   ____________________________________________   PHYSICAL EXAM:  VITAL SIGNS: ED Triage Vitals [06/06/18 0131]  Enc Vitals Group     BP 126/87     Pulse Rate 87     Resp 18     Temp 98.4 F (36.9 C)     Temp Source Oral     SpO2 100 %     Weight 75.8 kg (167 lb)     Height 1.626 m (5\' 4" )     Head Circumference      Peak Flow      Pain Score 0     Pain Loc      Pain Edu?      Excl. in GC?     Constitutional: Alert and oriented. Well appearing and in no acute distress. Eyes: Conjunctivae are normal.  Head: Atraumatic. Mouth/Throat: Mucous membranes are moist.  Oropharynx non-erythematous. Neck: No stridor.   Cardiovascular: Normal rate, regular rhythm. Good peripheral circulation. Grossly normal heart sounds. Respiratory: Normal respiratory effort.  No retractions. Lungs CTAB. Gastrointestinal: Soft and nontender. No distention.  Genitourinary: Malodorous white vaginal discharge. Musculoskeletal: No lower extremity tenderness nor  edema. No gross deformities of extremities. Neurologic:  Normal speech and language. No gross focal neurologic deficits are appreciated.  Skin:  Skin is warm, dry and intact. No rash noted. Psychiatric: Mood and affect are normal. Speech and behavior are normal.  ____________________________________________   LABS (all labs ordered are listed, but only abnormal results are displayed)  Labs Reviewed  WET PREP, GENITAL - Abnormal; Notable for the following components:      Result Value   Clue Cells Wet Prep HPF POC PRESENT (*)    WBC, Wet Prep HPF POC RARE (*)    All other components within normal limits  URINALYSIS, COMPLETE (UACMP) WITH  MICROSCOPIC - Abnormal; Notable for the following components:   Color, Urine YELLOW (*)    APPearance TURBID (*)    Hgb urine dipstick LARGE (*)    Protein, ur 100 (*)    Leukocytes, UA MODERATE (*)    RBC / HPF >50 (*)    WBC, UA >50 (*)    Non Squamous Epithelial 0-5 (*)    All other components within normal limits  CHLAMYDIA/NGC RT PCR ([facility] ONLY)  POCT PREGNANCY, URINE     Procedures   ____________________________________________   INITIAL IMPRESSION / ASSESSMENT AND PLAN / ED COURSE  As part of my medical decision making, I reviewed the following data within the electronic MEDICAL RECORD NUMBER   27 year old female present with above-stated history and physical exam consistent with possible STI, urinary tract infection, bacterial vaginosis.  Vaginal swab revealed evidence of clue cells consistent with BV.  Patient unwilling to remain in the emergency department to wait for gonorrhea and chlamydia results to return.  Patient prescribed Flagyl ____________________________________________  FINAL CLINICAL IMPRESSION(S) / ED DIAGNOSES  Final diagnoses:  BV (bacterial vaginosis)     MEDICATIONS GIVEN DURING THIS VISIT:  Medications - No data to display   ED Discharge Orders    None       Note:  This document was prepared using Dragon voice recognition software and may include unintentional dictation errors.    Darci Current, MD 06/06/18 [phone]

## 2018-08-28 LAB — HM HIV SCREENING LAB: HM HIV Screening: NEGATIVE

## 2018-09-20 ENCOUNTER — Encounter: Payer: Self-pay | Admitting: Emergency Medicine

## 2018-09-20 ENCOUNTER — Ambulatory Visit
Admission: EM | Admit: 2018-09-20 | Discharge: 2018-09-20 | Disposition: A | Discharge status: Home / Self Care | Payer: Medicaid Other | Attending: Family Medicine | Admitting: Family Medicine

## 2018-09-20 ENCOUNTER — Other Ambulatory Visit: Payer: Self-pay

## 2018-09-20 DIAGNOSIS — Z8619 Personal history of other infectious and parasitic diseases: Secondary | ICD-10-CM | POA: Insufficient documentation

## 2018-09-20 DIAGNOSIS — K0889 Other specified disorders of teeth and supporting structures: Secondary | ICD-10-CM | POA: Diagnosis not present

## 2018-09-20 DIAGNOSIS — Z8249 Family history of ischemic heart disease and other diseases of the circulatory system: Secondary | ICD-10-CM | POA: Insufficient documentation

## 2018-09-20 DIAGNOSIS — Z87440 Personal history of urinary (tract) infections: Secondary | ICD-10-CM | POA: Diagnosis not present

## 2018-09-20 DIAGNOSIS — N898 Other specified noninflammatory disorders of vagina: Secondary | ICD-10-CM | POA: Diagnosis not present

## 2018-09-20 LAB — URINALYSIS, COMPLETE (UACMP) WITH MICROSCOPIC
Glucose, UA: NEGATIVE mg/dL
Ketones, ur: NEGATIVE mg/dL
Leukocytes, UA: NEGATIVE
NITRITE: NEGATIVE
PROTEIN: 30 mg/dL — AB
SPECIFIC GRAVITY, URINE: 1.025 (ref 1.005–1.030)
pH: 6 (ref 5.0–8.0)

## 2018-09-20 LAB — WET PREP, GENITAL
CLUE CELLS WET PREP: NONE SEEN
Sperm: NONE SEEN
TRICH WET PREP: NONE SEEN
Yeast Wet Prep HPF POC: NONE SEEN

## 2018-09-20 MED ORDER — AMOXICILLIN-POT CLAVULANATE 875-125 MG PO TABS: 1.0000 | ORAL_TABLET | Freq: Two times a day (BID) | ORAL | 0 refills | Status: DC

## 2018-09-20 MED ORDER — FLUCONAZOLE 150 MG PO TABS: ORAL_TABLET | ORAL | 0 refills | Status: DC

## 2018-09-20 NOTE — ED Provider Notes (Signed)
[facility]    CSN: [id number] Arrival date & time: 09/20/18  1108     History   Chief Complaint Chief Complaint  Patient presents with  . Vaginal Itching  . Vaginal Discharge  . Dental Pain    HPI Jody Perez is a 27 y.o. female.   HPI  27 year old female presents with the below past medical history.  She states that she has been experiencing vaginal itching and white discharge  that started about 2 weeks ago.  Denied any urinary symptoms.  She also denied any STD exposure.  Tells me that 2 weeks ago she was at the [facility] department where they performed tests for STD as well as a wet prep.  They told her that everything looked fine and that she did not need any treatment but that urinalysis would complete her work-up and this should be done at an urgent care center.  States that  when she urinates  she will times have  burning.  Otherwise she denies frequency urgency.  Second problem today is that of pain on the top right first molar that she  had for a week.  She was scheduled to have the tooth pulled yesterday but missed her appointment.  Now she continues to have pain particularly with chewing.  She has rescheduled a dental extraction for November 10.  No fever or chills.  Does not have bad taste in her mouth not noticed any drainage from her gums.  The gums themselves are not tender but chewing pressure on the biting surface of the tooth is very tender.          Past Medical History:  Diagnosis Date  . BV (bacterial vaginosis)   . Chlamydia   . Herpes genitalis in women   . HSV infection   . Miscarriage   . Trichomonas   . UTI (lower urinary tract infection)     Patient Active Problem List   Diagnosis Date Noted  . Anemia 03/27/2018  . Bacterial vaginosis 11/17/2017  . History of genital HSV 01/28/2013    Past Surgical History:  Procedure Laterality Date  . NO PAST SURGERIES      OB History    Gravida  4   Para  3   Term   3   Preterm  0   AB  1   Living  3     SAB  1   TAB  0   Ectopic  0   Multiple  0   Live Births  3            Home Medications    Prior to Admission medications   Medication Sig Start Date End Date Taking? Authorizing Provider  amoxicillin-clavulanate (AUGMENTIN) 875-125 MG tablet Take 1 tablet by mouth every 12 (twelve) hours. 09/20/18   Lutricia Feil, PA-C  fluconazole (DIFLUCAN) 150 MG tablet Take one tab for symptoms of yeast infection. Repeat x 1 in 72 hours. 09/20/18   Lutricia Feil, PA-C    Family History Family History  Problem Relation Age of Onset  . Hypertension Mother   . Healthy Brother   . Healthy Father   . Breast cancer Maternal Grandmother   . Healthy Brother   . Colon cancer Neg Hx   . Ovarian cancer Neg Hx   . Cervical cancer Neg Hx     Social History Social History   Tobacco Use  . Smoking status: Never Smoker  . Smokeless tobacco: Never  Used  Substance Use Topics  . Alcohol use: No    Comment: socially  . Drug use: No     Allergies   Patient has no known allergies.   Review of Systems Review of Systems  Constitutional: Negative for activity change, appetite change, chills, fatigue and fever.  HENT: Positive for dental problem.   Genitourinary: Positive for dysuria, vaginal discharge and vaginal pain.  All other systems reviewed and are negative.    Physical Exam Triage Vital Signs ED Triage Vitals  Enc Vitals Group     BP 09/20/18 1122 124/85     Pulse Rate 09/20/18 1122 77     Resp 09/20/18 1122 18     Temp 09/20/18 1122 98.7 F (37.1 C)     Temp Source 09/20/18 1122 Oral     SpO2 09/20/18 1122 100 %     Weight 09/20/18 1120 155 lb (70.3 kg)     Height 09/20/18 1120 5\' 4"  (1.626 m)     Head Circumference --      Peak Flow --      Pain Score 09/20/18 1120 8     Pain Loc --      Pain Edu? --      Excl. in GC? --    No data found.  Updated Vital Signs BP 124/85 (BP Location: Left Arm)   Pulse  77   Temp 98.7 F (37.1 C) (Oral)   Resp 18   Ht 5\' 4"  (1.626 m)   Wt 155 lb (70.3 kg)   LMP 09/12/2018 (Exact Date)   SpO2 100%   BMI 26.61 kg/m   Visual Acuity Right Eye Distance:   Left Eye Distance:   Bilateral Distance:    Right Eye Near:   Left Eye Near:    Bilateral Near:     Physical Exam  Constitutional: She is oriented to person, place, and time. She appears well-developed and well-nourished. No distress.  HENT:  Head: Normocephalic.  Examination of the mouth, dental in fairly good repair.  Has a tenderness to percussion over the first molar right upper.  The gums are not inflamed do not have any fluctuation or induration.  Eyes: Pupils are equal, round, and reactive to light. Right eye exhibits no discharge. Left eye exhibits no discharge.  Neck: Normal range of motion.  Genitourinary:  Genitourinary Comments: The patient performed a self swab  Musculoskeletal: Normal range of motion.  Neurological: She is alert and oriented to person, place, and time.  Skin: Skin is warm and dry. She is not diaphoretic.  Psychiatric: She has a normal mood and affect. Her behavior is normal. Judgment and thought content normal.  Nursing note and vitals reviewed.    UC Treatments / Results  Labs (all labs ordered are listed, but only abnormal results are displayed) Labs Reviewed  WET PREP, GENITAL - Abnormal; Notable for the following components:      Result Value   WBC, Wet Prep HPF POC FEW (*)    All other components within normal limits  URINALYSIS, COMPLETE (UACMP) WITH MICROSCOPIC - Abnormal; Notable for the following components:   Hgb urine dipstick SMALL (*)    Bilirubin Urine SMALL (*)    Protein, ur 30 (*)    Bacteria, UA FEW (*)    All other components within normal limits    EKG None  Radiology No results found.  Procedures Procedures (including critical care time)  Medications Ordered in UC Medications - No data to display  Initial Impression /  Assessment and Plan / UC Course  I have reviewed the triage vital signs and the nursing notes.  Pertinent labs & imaging results that were available during my care of the patient were reviewed by me and considered in my medical decision making (see chart for details).     Even though the wet prep was negative I will treat her for possible yeast infection from her description of her symptoms.  She gives a very dirty urine sample this will not be cultured.  I will start her on an antibiotic for possible abscess of the tooth since she had scheduled to have it extracted missed her appointment.  Encouraged her to make sure she keeps her next appointment.  Is increasing pain fever should contact her dentist for an earlier appointment.  If she is not improving on the Diflucan she should return to our clinic or go to the health department. Final Clinical Impressions(s) / UC Diagnoses   Final diagnoses:  Pain, dental  Vaginal discharge   Discharge Instructions   None    ED Prescriptions    Medication Sig Dispense Auth. Provider   amoxicillin-clavulanate (AUGMENTIN) 875-125 MG tablet Take 1 tablet by mouth every 12 (twelve) hours. 14 tablet Ovid Curd P, PA-C   fluconazole (DIFLUCAN) 150 MG tablet Take one tab for symptoms of yeast infection. Repeat x 1 in 72 hours. 2 tablet Lutricia Feil, PA-C     Controlled Substance Prescriptions [place] Controlled Substance Registry consulted? Not Applicable   Lutricia Feil, PA-C 09/20/18 1231

## 2018-09-20 NOTE — ED Triage Notes (Signed)
Patient c/o vaginal itching and white discharge that started 2 weeks ago. Patient denies urinary symptoms. Patient denies STD exposure.  Patient also c/o tooth pain to the top right x 1 week.

## 2018-10-03 ENCOUNTER — Ambulatory Visit: Payer: Medicaid Other | Admitting: Family Medicine

## 2018-10-03 ENCOUNTER — Encounter: Payer: Self-pay | Admitting: Family Medicine

## 2018-10-03 VITALS — BP 113/71 | HR 76 | Temp 98.5°F | Wt 166.2 lb

## 2018-10-03 DIAGNOSIS — M7631 Iliotibial band syndrome, right leg: Secondary | ICD-10-CM | POA: Diagnosis not present

## 2018-10-03 MED ORDER — MELOXICAM 15 MG PO TABS: 15.0000 mg | ORAL_TABLET | Freq: Every day | ORAL | 0 refills | Status: DC

## 2018-10-03 NOTE — Progress Notes (Signed)
Patient: Jody Perez Female    DOB: 1991-06-28   27 y.o.   MRN: [id number] Visit Date: 10/03/2018  Today's Provider: [name], MD   Chief Complaint  Patient presents with  . Leg Pain   Subjective:    I, Jody Perez, CMA, am acting as a Neurosurgeon for [name], MD.   Leg Pain   Incident onset: after birthing second child. There was no injury mechanism. The pain is present in the right thigh. The quality of the pain is described as aching. The pain has been worsening since onset. Associated symptoms include muscle weakness and tingling. Pertinent negatives include no numbness. She reports no foreign bodies present. Exacerbated by: cold weather. She has tried nothing for the symptoms.     No Known Allergies  No current outpatient medications on file.  Review of Systems  Constitutional: Negative.   Respiratory: Negative.   Cardiovascular: Negative.   Musculoskeletal: Negative.        Leg pain   Neurological: Positive for tingling. Negative for numbness.    Social History   Tobacco Use  . Smoking status: Never Smoker  . Smokeless tobacco: Never Used  Substance Use Topics  . Alcohol use: No    Comment: socially   Objective:   BP 113/71 (BP Location: Right Arm, Patient Position: Sitting, Cuff Size: Normal)   Pulse 76   Temp 98.5 F (36.9 C) (Oral)   Wt 166 lb 3.2 oz (75.4 kg)   LMP 09/12/2018 (Exact Date)   BMI 28.53 kg/m  Vitals:   10/03/18 1047  BP: 113/71  Pulse: 76  Temp: 98.5 F (36.9 C)  TempSrc: Oral  Weight: 166 lb 3.2 oz (75.4 kg)     Physical Exam  Constitutional: She is oriented to person, place, and time. She appears well-developed and well-nourished. No distress.  HENT:  Head: Normocephalic and atraumatic.  Eyes: Conjunctivae are normal. No scleral icterus.  Neck: Neck supple. No thyromegaly present.  Cardiovascular: Normal rate, regular rhythm, normal heart sounds and intact distal pulses.  No murmur  heard. Pulmonary/Chest: Effort normal and breath sounds normal. No respiratory distress. She has no wheezes. She has no rales.  Musculoskeletal: She exhibits no edema.  R thigh: ROM intact, except internal rotation is somewhat limited due to muscle tightness.  4+/5 strength of abductors, but otherwise 5/5 throughout LEs.  TTP over IT band.  Lymphadenopathy:    She has no cervical adenopathy.  Neurological: She is alert and oriented to person, place, and time.  Skin: Skin is warm and dry. Capillary refill takes less than 2 seconds. No rash noted.  Psychiatric: She has a normal mood and affect. Her behavior is normal.  Vitals reviewed.       Assessment & Plan:   1. It band syndrome, right - exam and history c/w IT band syndrome - Will treat with NSAIDs and PT - discussed importance of HEP, ice, rest - return precautions discussed - Ambulatory referral to Physical Therapy    Meds ordered this encounter  Medications  . meloxicam (MOBIC) 15 MG tablet    Sig: Take 1 tablet (15 mg total) by mouth daily.    Dispense:  30 tablet    Refill:  0     Return if symptoms worsen or fail to improve.   The entirety of the information documented in the History of Present Illness, Review of Systems and Physical Exam were personally obtained by me. Portions of this information were initially  documented by Jody Perez, CMA and reviewed by me for thoroughness and accuracy.    Jody Downer, MD, MPH [facility] 10/03/2018 1:59 PM

## 2018-10-03 NOTE — Patient Instructions (Signed)
Iliotibial Band Syndrome  Iliotibial band syndrome (ITBS) is a condition that often causes knee pain. It can also cause pain in the outside of your hip, thigh, and knee. The iliotibial band is a strip of tissue that runs from the outside of your hip and down your thigh to the outside of your knee.  Repeatedly bending and straightening your knee can irritate the iliotibial band.  What are the causes?  This condition is caused by inflammation and irritation from the friction of the iliotibial band moving over the thigh bone (femur) when you repeatedly bend and straighten your knee.  What increases the risk?  This condition is more likely to develop in people who:  · Frequently change elevation during their workouts.  · Run very long distances.  · Recently increased the length or intensity of their workouts.  · Run downhill often, or just started running downhill.  · Ride a bike very far or often.    You may also be at greater risk if you start a new workout routine without first warming up or if you have a job that requires you to bend, squat, or climb frequently.  What are the signs or symptoms?  Symptoms of this condition include:  · Pain along the outside of your knee that may be worse with activity, especially running or going up and down stairs.  · A “snapping” sensation over your knee.  · Swelling on the outside of your knee.  · Pain or a feeling of tightness in your hip.    How is this diagnosed?  This condition is diagnosed based on your symptoms, medical history, and physical exam. You may also see a health care provider who specializes in reducing pain and increasing mobility (physical therapist). A physical therapist may do an exam to check your balance, movement, and way of walking or running (gait) to see whether the way you move could contribute to your injury. You may also have tests to measure your strength, flexibility, and range of motion.  How is this treated?   Treatment for this condition includes:  · Resting and limiting exercise.  · Returning to activities gradually.  · Doing range-of-motion and strengthening exercises (physical therapy) as told by your health care provider.  · Including low-impact activities, such as swimming, in your exercise routine.    Follow these instructions at home:  · If directed, apply ice to the injured area.  ? Put ice in a plastic bag.  ? Place a towel between your skin and the bag.  ? Leave the ice on for 20 minutes, 2–3 times per day.  · Return to your normal activities as told by your health care provider. Ask your health care provider what activities are safe for you.  · Keep all follow-up visits with your health care provider. This is important.  Contact a health care provider if:  · Your pain does not improve or gets worse despite treatment.  This information is not intended to replace advice given to you by your health care provider. Make sure you discuss any questions you have with your health care provider.  Document Released: 05/05/2002 Document Revised: 12/15/2016 Document Reviewed: 12/15/2016  Elsevier Interactive Patient Education © 2018 [employer]

## 2018-10-04 ENCOUNTER — Other Ambulatory Visit: Payer: Self-pay

## 2018-10-04 ENCOUNTER — Encounter: Payer: Self-pay | Admitting: Emergency Medicine

## 2018-10-04 ENCOUNTER — Ambulatory Visit
Admission: EM | Admit: 2018-10-04 | Discharge: 2018-10-04 | Disposition: A | Discharge status: Home / Self Care | Payer: Medicaid Other | Attending: Family Medicine | Admitting: Family Medicine

## 2018-10-04 DIAGNOSIS — Z79899 Other long term (current) drug therapy: Secondary | ICD-10-CM | POA: Diagnosis not present

## 2018-10-04 DIAGNOSIS — B373 Candidiasis of vulva and vagina: Secondary | ICD-10-CM | POA: Insufficient documentation

## 2018-10-04 DIAGNOSIS — B3731 Acute candidiasis of vulva and vagina: Secondary | ICD-10-CM

## 2018-10-04 LAB — WET PREP, GENITAL
CLUE CELLS WET PREP: NONE SEEN
SPERM: NONE SEEN
TRICH WET PREP: NONE SEEN
Yeast Wet Prep HPF POC: NONE SEEN

## 2018-10-04 LAB — URINALYSIS, COMPLETE (UACMP) WITH MICROSCOPIC
Bilirubin Urine: NEGATIVE
Glucose, UA: NEGATIVE mg/dL
Hgb urine dipstick: NEGATIVE
Ketones, ur: NEGATIVE mg/dL
LEUKOCYTES UA: NEGATIVE
Nitrite: NEGATIVE
PH: 6 (ref 5.0–8.0)
Protein, ur: NEGATIVE mg/dL
RBC / HPF: NONE SEEN RBC/hpf (ref 0–5)
SPECIFIC GRAVITY, URINE: 1.025 (ref 1.005–1.030)

## 2018-10-04 MED ORDER — FLUCONAZOLE 150 MG PO TABS: 150.0000 mg | ORAL_TABLET | Freq: Every day | ORAL | 0 refills | Status: DC

## 2018-10-04 NOTE — ED Provider Notes (Signed)
[facility]    CSN: [id number] Arrival date & time: 10/04/18  0940     History   Chief Complaint Chief Complaint  Patient presents with  . Vaginal Itching    HPI Jody Perez is a 27 y.o. female.   The history is provided by the patient.  Vaginal Itching  This is a new problem. The current episode started more than 2 days ago. The problem occurs constantly. The problem has not changed since onset.Pertinent negatives include no chest pain, no abdominal pain, no headaches and no shortness of breath. She has tried nothing for the symptoms.    Past Medical History:  Diagnosis Date  . BV (bacterial vaginosis)   . Chlamydia   . Herpes genitalis in women   . HSV infection   . Miscarriage   . Trichomonas   . UTI (lower urinary tract infection)     Patient Active Problem List   Diagnosis Date Noted  . Anemia 03/27/2018  . Bacterial vaginosis 11/17/2017  . History of genital HSV 01/28/2013    Past Surgical History:  Procedure Laterality Date  . NO PAST SURGERIES      OB History    Gravida  4   Para  3   Term  3   Preterm  0   AB  1   Living  3     SAB  1   TAB  0   Ectopic  0   Multiple  0   Live Births  3            Home Medications    Prior to Admission medications   Medication Sig Start Date End Date Taking? Authorizing Provider  fluconazole (DIFLUCAN) 150 MG tablet Take 1 tablet (150 mg total) by mouth daily. 10/04/18   Payton Mccallum, MD  meloxicam (MOBIC) 15 MG tablet Take 1 tablet (15 mg total) by mouth daily. 10/03/18   Erasmo Downer, MD    Family History Family History  Problem Relation Age of Onset  . Hypertension Mother   . Healthy Brother   . Healthy Father   . Breast cancer Maternal Grandmother   . Healthy Brother   . Colon cancer Neg Hx   . Ovarian cancer Neg Hx   . Cervical cancer Neg Hx     Social History Social History   Tobacco Use  . Smoking status: Never Smoker  . Smokeless tobacco: Never  Used  Substance Use Topics  . Alcohol use: No    Comment: socially  . Drug use: No     Allergies   Patient has no known allergies.   Review of Systems Review of Systems  Respiratory: Negative for shortness of breath.   Cardiovascular: Negative for chest pain.  Gastrointestinal: Negative for abdominal pain.  Neurological: Negative for headaches.     Physical Exam Triage Vital Signs ED Triage Vitals  Enc Vitals Group     BP 10/04/18 1008 130/69     Pulse Rate 10/04/18 1008 67     Resp 10/04/18 1008 14     Temp 10/04/18 1008 97.9 F (36.6 C)     Temp Source 10/04/18 1008 Oral     SpO2 10/04/18 1008 100 %     Weight 10/04/18 1004 166 lb (75.3 kg)     Height 10/04/18 1004 5\' 4"  (1.626 m)     Head Circumference --      Peak Flow --      Pain Score 10/04/18 1004  6     Pain Loc --      Pain Edu? --      Excl. in GC? --    No data found.  Updated Vital Signs BP 130/69 (BP Location: Left Arm)   Pulse 67   Temp 97.9 F (36.6 C) (Oral)   Resp 14   Ht 5\' 4"  (1.626 m)   Wt 75.3 kg   LMP 09/12/2018 (Exact Date)   SpO2 100%   Breastfeeding? No   BMI 28.49 kg/m   Visual Acuity Right Eye Distance:   Left Eye Distance:   Bilateral Distance:    Right Eye Near:   Left Eye Near:    Bilateral Near:     Physical Exam  Constitutional: She appears well-developed and well-nourished. No distress.  Genitourinary: Pelvic exam was performed with patient supine. There is rash (erythema) on the right labia. There is no lesion or injury on the right labia. There is rash (erythema) on the left labia. There is no lesion or injury on the left labia.  Genitourinary Comments: Heather RN present as chaperone  Skin: She is not diaphoretic.  Nursing note and vitals reviewed.    UC Treatments / Results  Labs (all labs ordered are listed, but only abnormal results are displayed) Labs Reviewed  WET PREP, GENITAL - Abnormal; Notable for the following components:      Result Value    WBC, Wet Prep HPF POC FEW (*)    All other components within normal limits  URINALYSIS, COMPLETE (UACMP) WITH MICROSCOPIC - Abnormal; Notable for the following components:   APPearance HAZY (*)    Bacteria, UA FEW (*)    All other components within normal limits    EKG None  Radiology No results found.  Procedures Procedures (including critical care time)  Medications Ordered in UC Medications - No data to display  Initial Impression / Assessment and Plan / UC Course  I have reviewed the triage vital signs and the nursing notes.  Pertinent labs & imaging results that were available during my care of the patient were reviewed by me and considered in my medical decision making (see chart for details).      Final Clinical Impressions(s) / UC Diagnoses   Final diagnoses:  Yeast vaginitis    ED Prescriptions    Medication Sig Dispense Auth. Provider   fluconazole (DIFLUCAN) 150 MG tablet Take 1 tablet (150 mg total) by mouth daily. 1 tablet Payton Mccallum, MD      1. Lab results and diagnosis reviewed with patient 2. rx as per orders above; reviewed possible side effects, interactions, risks and benefits  3. Recommend supportive treatment with otc antifungal vaginal cream 4. Follow-up prn if symptoms worsen or don't improve  Controlled Substance Prescriptions [place] Controlled Substance Registry consulted? Not Applicable   Payton Mccallum, MD 10/04/18 [phone]

## 2018-10-04 NOTE — ED Triage Notes (Addendum)
Patient c/o vaginal irritation and itching that started 3 days ago.  Patient denies vaginal discharge.  Patient also reports some burning when urinating.

## 2018-10-05 ENCOUNTER — Other Ambulatory Visit: Payer: Self-pay | Admitting: Obstetrics & Gynecology

## 2018-10-29 ENCOUNTER — Ambulatory Visit: Payer: Medicaid Other

## 2018-11-05 ENCOUNTER — Ambulatory Visit: Payer: Medicaid Other

## 2018-11-12 ENCOUNTER — Ambulatory Visit: Payer: Medicaid Other | Admitting: Physician Assistant

## 2018-11-12 ENCOUNTER — Encounter: Payer: Self-pay | Admitting: Physician Assistant

## 2018-11-12 ENCOUNTER — Other Ambulatory Visit (HOSPITAL_COMMUNITY)
Admission: RE | Admit: 2018-11-12 | Discharge: 2018-11-12 | Disposition: A | Discharge status: Home / Self Care | Payer: Medicaid Other | Source: Ambulatory Visit | Attending: Physician Assistant | Admitting: Physician Assistant

## 2018-11-12 VITALS — BP 111/74 | HR 81 | Temp 98.3°F | Resp 16 | Wt 167.2 lb

## 2018-11-12 DIAGNOSIS — N888 Other specified noninflammatory disorders of cervix uteri: Secondary | ICD-10-CM

## 2018-11-12 DIAGNOSIS — B373 Candidiasis of vulva and vagina: Secondary | ICD-10-CM

## 2018-11-12 DIAGNOSIS — Z202 Contact with and (suspected) exposure to infections with a predominantly sexual mode of transmission: Secondary | ICD-10-CM | POA: Diagnosis not present

## 2018-11-12 DIAGNOSIS — N898 Other specified noninflammatory disorders of vagina: Secondary | ICD-10-CM | POA: Diagnosis not present

## 2018-11-12 DIAGNOSIS — Z124 Encounter for screening for malignant neoplasm of cervix: Secondary | ICD-10-CM | POA: Diagnosis not present

## 2018-11-12 DIAGNOSIS — A599 Trichomoniasis, unspecified: Secondary | ICD-10-CM

## 2018-11-12 DIAGNOSIS — R300 Dysuria: Secondary | ICD-10-CM

## 2018-11-12 DIAGNOSIS — N3090 Cystitis, unspecified without hematuria: Secondary | ICD-10-CM

## 2018-11-12 DIAGNOSIS — B3731 Acute candidiasis of vulva and vagina: Secondary | ICD-10-CM

## 2018-11-12 LAB — POCT URINALYSIS DIPSTICK
Bilirubin, UA: NEGATIVE
Glucose, UA: NEGATIVE
Ketones, UA: NEGATIVE
Nitrite, UA: NEGATIVE
Odor: NEGATIVE
Protein, UA: POSITIVE — AB
Spec Grav, UA: 1.025 (ref 1.010–1.025)
Urobilinogen, UA: 0.2 E.U./dL
pH, UA: 6.5 (ref 5.0–8.0)

## 2018-11-12 MED ORDER — CEFTRIAXONE SODIUM 500 MG IJ SOLR: 250.0000 mg | Freq: Once | INTRAMUSCULAR | Status: DC

## 2018-11-12 MED ORDER — CEFTRIAXONE SODIUM 500 MG IJ SOLR
500.0000 mg | Freq: Once | INTRAMUSCULAR | Status: AC
  Administered 2018-11-12: 500 mg via INTRAMUSCULAR

## 2018-11-12 MED ORDER — AZITHROMYCIN 500 MG PO TABS: 1000.0000 mg | ORAL_TABLET | Freq: Every day | ORAL | 0 refills | Status: DC

## 2018-11-12 NOTE — Patient Instructions (Signed)
Sexually Transmitted Disease  A sexually transmitted disease (STD) is a disease or infection that may be passed (transmitted) from person to person, usually during sexual activity. This may happen by way of saliva, semen, blood, vaginal mucus, or urine. Common STDs include:   Gonorrhea.   Chlamydia.   Syphilis.   HIV and AIDS.   Genital herpes.   Hepatitis B and C.   Trichomonas.   Human papillomavirus (HPV).   Pubic lice.   Scabies.   Mites.   Bacterial vaginosis.    What are the causes?  An STD may be caused by bacteria, a virus, or parasites. STDs are often transmitted during sexual activity if one person is infected. However, they may also be transmitted through nonsexual means. STDs may be transmitted after:   Sexual intercourse with an infected person.   Sharing sex toys with an infected person.   Sharing needles with an infected person or using unclean piercing or tattoo needles.   Having intimate contact with the genitals, mouth, or rectal areas of an infected person.   Exposure to infected fluids during birth.    What are the signs or symptoms?  Different STDs have different symptoms. Some people may not have any symptoms. If symptoms are present, they may include:   Painful or bloody urination.   Pain in the pelvis, abdomen, vagina, anus, throat, or eyes.   A skin rash, itching, or irritation.   Growths, ulcerations, blisters, or sores in the genital and anal areas.   Abnormal vaginal discharge with or without bad odor.   Penile discharge in men.   Fever.   Pain or bleeding during sexual intercourse.   Swollen glands in the groin area.   Yellow skin and eyes (jaundice). This is seen with hepatitis.   Swollen testicles.   Infertility.   Sores and blisters in the mouth.    How is this diagnosed?  To make a diagnosis, your health care provider may:   Take a medical history.   Perform a physical exam.   Take a sample of any discharge to examine.   Swab the throat, cervix,  opening to the penis, rectum, or vagina for testing.   Test a sample of your first morning urine.   Perform blood tests.   Perform a Pap test, if this applies.   Perform a colposcopy.   Perform a laparoscopy.    How is this treated?  Treatment depends on the STD. Some STDs may be treated but not cured.   Chlamydia, gonorrhea, trichomonas, and syphilis can be cured with antibiotic medicine.   Genital herpes, hepatitis, and HIV can be treated, but not cured, with prescribed medicines. The medicines lessen symptoms.   Genital warts from HPV can be treated with medicine or by freezing, burning (electrocautery), or surgery. Warts may come back.   HPV cannot be cured with medicine or surgery. However, abnormal areas may be removed from the cervix, vagina, or vulva.   If your diagnosis is confirmed, your recent sexual partners need treatment. This is true even if they are symptom-free or have a negative culture or evaluation. They should not have sex until their health care providers say it is okay.   Your health care provider may test you for infection again 3 months after treatment.    How is this prevented?  Take these steps to reduce your risk of getting an STD:   Use latex condoms, dental dams, and water-soluble lubricants during sexual activity. Do not use   petroleum jelly or oils.   Avoid having multiple sex partners.   Do not have sex with someone who has other sex partners.   Do not have sex with anyone you do not know or who is at high risk for an STD.   Avoid risky sex practices that can break your skin.   Do not have sex if you have open sores on your mouth or skin.   Avoid drinking too much alcohol or taking illegal drugs. Alcohol and drugs can affect your judgment and put you in a vulnerable position.   Avoid engaging in oral and anal sex acts.   Get vaccinated for HPV and hepatitis. If you have not received these vaccines in the past, talk to your health care provider about whether one or  both might be right for you.   If you are at risk of being infected with HIV, it is recommended that you take a prescription medicine daily to prevent HIV infection. This is called pre-exposure prophylaxis (PrEP). You are considered at risk if:  ? You are a man who has sex with other men (MSM).  ? You are a heterosexual man or woman and are sexually active with more than one partner.  ? You take drugs by injection.  ? You are sexually active with a partner who has HIV.   Talk with your health care provider about whether you are at high risk of being infected with HIV. If you choose to begin PrEP, you should first be tested for HIV. You should then be tested every 3 months for as long as you are taking PrEP.    Contact a health care provider if:   See your health care provider.   Tell your sexual partner(s). They should be tested and treated for any STDs.   Do not have sex until your health care provider says it is okay.  Get help right away if:  Contact your health care provider right away if:   You have severe abdominal pain.   You are a man and notice swelling or pain in your testicles.   You are a woman and notice swelling or pain in your vagina.    This information is not intended to replace advice given to you by your health care provider. Make sure you discuss any questions you have with your health care provider.  Document Released: 02/03/2003 Document Revised: 06/02/2016 Document Reviewed: 06/03/2013  Elsevier Interactive Patient Education  2018 [employer]

## 2018-11-12 NOTE — Progress Notes (Addendum)
Patient: Jody Perez Female    DOB: [birth year]   27 y.o.   MRN: [id number] Visit Date: 11/13/2018  Today's Provider: Trey Sailors, PA-C   Chief Complaint  Patient presents with  . Exposure to STD   Subjective:     HPI Sexually Transmitted Disease Check: Patient presents for sexually transmitted disease check. Sexual history reviewed with the patient. STD exposure: current sexual partner thought to have no history of any STD.  Previous history of STD:  chlamydia, trichomonas, HPV. Current symptoms include vaginal discharge: white, bloody, thick and greyish.  Contraception: none. She reports her female sexual partner was told he was exposed to an unknown STI, currently getting tested. She reports history of chlamydia and trichomonas in the past. She is not breast feeding currently and has no drug allergies. She also reports some dysuria. She had used monistat previously because she thought it might be a yeast infection. Do not see documented PAP, she is unsure when her last one was.    No Known Allergies   Current Outpatient Medications:  .  meloxicam (MOBIC) 15 MG tablet, Take 1 tablet (15 mg total) by mouth daily., Disp: 30 tablet, Rfl: 0 .  valACYclovir (VALTREX) 1000 MG tablet, TAKE 1 TABLET BY MOUTH ONCE DAILY, Disp: 30 tablet, Rfl: 5 .  azithromycin (ZITHROMAX) 500 MG tablet, Take 2 tablets (1,000 mg total) by mouth daily., Disp: 2 tablet, Rfl: 0 .  fluconazole (DIFLUCAN) 150 MG tablet, Take 1 tablet (150 mg total) by mouth daily. (Patient not taking: Reported on 11/12/2018), Disp: 1 tablet, Rfl: 0  Review of Systems  Social History   Tobacco Use  . Smoking status: Never Smoker  . Smokeless tobacco: Never Used  Substance Use Topics  . Alcohol use: No    Comment: socially      Objective:   BP 111/74 (BP Location: Left Arm, Patient Position: Sitting, Cuff Size: Large)   Pulse 81   Temp 98.3 F (36.8 C) (Oral)   Resp 16   Wt 167 lb 3.2 oz (75.8 kg)   LMP  11/03/2018   BMI 28.70 kg/m  Vitals:   11/12/18 0957  BP: 111/74  Pulse: 81  Resp: 16  Temp: 98.3 F (36.8 C)  TempSrc: Oral  Weight: 167 lb 3.2 oz (75.8 kg)     Physical Exam Constitutional:      Appearance: Normal appearance.  Abdominal:     General: Abdomen is flat.     Palpations: Abdomen is soft.  Genitourinary:    Labia:        Right: No rash, tenderness, lesion or injury.        Left: No rash, tenderness, lesion or injury.      Vagina: Normal.     Cervix: Discharge present. No friability or cervical bleeding.     Uterus: Normal.      Comments: Some greenish cervical discharge.  Neurological:     Mental Status: She is alert and oriented to person, place, and time. Mental status is at baseline.  Psychiatric:        Mood and Affect: Mood normal.        Behavior: Behavior normal.         Assessment & Plan    1. Exposure to STD  Suspicious for sexually transmitted infection. Will treat empirically for gonorrhea and chlamydia. Test for STI as below and also send off PAP. Will tailor tx based on results.   Swab has  come back positive for trichomonas and urine culture came back positive for e. Coli. Will send in 2g single dose flagyl for trichomonas and keflex for UTI.   - Cervicovaginal ancillary only - cefTRIAXone (ROCEPHIN) injection 500 mg  2. Cervical discharge  - azithromycin (ZITHROMAX) 500 MG tablet; Take 2 tablets (1,000 mg total) by mouth daily.  Dispense: 2 tablet; Refill: 0  3. Cervical cancer screening  - Cytology - PAP  4. Dysuria  - POCT urinalysis dipstick - Urine Culture  Return if symptoms worsen or fail to improve.  The entirety of the information documented in the History of Present Illness, Review of Systems and Physical Exam were personally obtained by me. Portions of this information were initially documented by Hetty ElyJoseline Rosas, CMA and reviewed by me for thoroughness and accuracy.   I have spent 25 minutes with this patient, >50%  of which was spent on counseling and coordination of care.        Trey SailorsAdriana M Pollak, PA-C  [facility]

## 2018-11-13 LAB — CERVICOVAGINAL ANCILLARY ONLY
Bacterial vaginitis: NEGATIVE
Candida vaginitis: NEGATIVE
Chlamydia: NEGATIVE
Neisseria Gonorrhea: NEGATIVE
Trichomonas: POSITIVE — AB

## 2018-11-14 ENCOUNTER — Telehealth: Payer: Self-pay

## 2018-11-14 LAB — URINE CULTURE

## 2018-11-14 LAB — CYTOLOGY - PAP
Diagnosis: NEGATIVE
HPV: NOT DETECTED

## 2018-11-14 MED ORDER — CEPHALEXIN 500 MG PO CAPS: 500.0000 mg | ORAL_CAPSULE | Freq: Two times a day (BID) | ORAL | 0 refills | Status: AC

## 2018-11-14 MED ORDER — FLUCONAZOLE 150 MG PO TABS: ORAL_TABLET | ORAL | 0 refills | Status: DC

## 2018-11-14 MED ORDER — METRONIDAZOLE 500 MG PO TABS: 2000.0000 mg | ORAL_TABLET | Freq: Once | ORAL | 0 refills | Status: AC

## 2018-11-14 NOTE — Addendum Note (Signed)
Addended by: Trey SailorsPOLLAK, ADRIANA M on: 11/14/2018 12:04 PM   Modules accepted: Orders

## 2018-11-14 NOTE — Telephone Encounter (Signed)
LMTCB

## 2018-11-14 NOTE — Telephone Encounter (Signed)
Pt returned missed call. °Please call pt back to discuss results. ° °Thanks, °TGH °

## 2018-11-14 NOTE — Telephone Encounter (Signed)
Patient advised as directed below.  Thanks,  -Joseline 

## 2018-11-14 NOTE — Telephone Encounter (Signed)
-----   Message from Trey SailorsAdriana M Pollak, [place]PA-C sent at 11/14/2018 11:20 AM EST ----- Her swab came back negative for gonorrhea, chlamydia, BV and candida. This did come back positive for trichomonas. The treatment for this is 2g flagyl in a single dose. Her sexual partners should be treated. She also looks like she has a UTI due to E. Coli. The rocephin started to treat this but she will likely need another antibiotic called keflex for this. OK to call in flagyl and keflex?

## 2018-11-14 NOTE — Telephone Encounter (Signed)
Patient advised as directed below. Per patient she uses [facility] in [address] and is requesting Diflucan

## 2018-11-14 NOTE — Telephone Encounter (Signed)
-----   Message from Trey SailorsAdriana M Pollak, [place]-C sent at 11/14/2018  1:38 PM EST ----- PAP smear normal. PAP smear did also detect trichomonas as well, which we have sent treatment for.

## 2018-11-14 NOTE — Telephone Encounter (Signed)
Sent in medications to [facility]. She should have test of cure in 3 months.

## 2018-11-15 LAB — CERVICOVAGINAL ANCILLARY ONLY: Herpes: NEGATIVE

## 2018-11-18 ENCOUNTER — Ambulatory Visit: Payer: Medicaid Other | Attending: Family Medicine

## 2018-11-25 ENCOUNTER — Ambulatory Visit: Payer: Medicaid Other

## 2018-11-28 ENCOUNTER — Ambulatory Visit: Payer: Medicaid Other

## 2019-01-04 ENCOUNTER — Encounter: Payer: Self-pay | Admitting: Gynecology

## 2019-01-04 ENCOUNTER — Other Ambulatory Visit: Payer: Self-pay

## 2019-01-04 ENCOUNTER — Ambulatory Visit
Admission: EM | Admit: 2019-01-04 | Discharge: 2019-01-04 | Disposition: A | Discharge status: Home / Self Care | Payer: Medicaid Other | Attending: Family Medicine | Admitting: Family Medicine

## 2019-01-04 DIAGNOSIS — N760 Acute vaginitis: Secondary | ICD-10-CM | POA: Diagnosis not present

## 2019-01-04 DIAGNOSIS — K0889 Other specified disorders of teeth and supporting structures: Secondary | ICD-10-CM | POA: Diagnosis present

## 2019-01-04 DIAGNOSIS — N390 Urinary tract infection, site not specified: Secondary | ICD-10-CM | POA: Diagnosis not present

## 2019-01-04 DIAGNOSIS — R319 Hematuria, unspecified: Secondary | ICD-10-CM

## 2019-01-04 DIAGNOSIS — B9689 Other specified bacterial agents as the cause of diseases classified elsewhere: Secondary | ICD-10-CM

## 2019-01-04 LAB — URINALYSIS, COMPLETE (UACMP) WITH MICROSCOPIC
Bilirubin Urine: NEGATIVE
Glucose, UA: NEGATIVE mg/dL
Ketones, ur: NEGATIVE mg/dL
Nitrite: POSITIVE — AB
Protein, ur: >300 mg/dL — AB
RBC / HPF: >50 RBC/hpf (ref 0–5)
Specific Gravity, Urine: 1.025 (ref 1.005–1.030)
WBC, UA: >50 WBC/hpf (ref 0–5)
pH: 6 (ref 5.0–8.0)

## 2019-01-04 LAB — WET PREP, GENITAL
Sperm: NONE SEEN
Trich, Wet Prep: NONE SEEN
Yeast Wet Prep HPF POC: NONE SEEN

## 2019-01-04 LAB — CHLAMYDIA/NGC RT PCR (ARMC ONLY)
Chlamydia Tr: NOT DETECTED
N gonorrhoeae: NOT DETECTED

## 2019-01-04 MED ORDER — METRONIDAZOLE 500 MG PO TABS: 500.0000 mg | ORAL_TABLET | Freq: Two times a day (BID) | ORAL | 0 refills | Status: DC

## 2019-01-04 MED ORDER — AMOXICILLIN-POT CLAVULANATE 875-125 MG PO TABS: 1.0000 | ORAL_TABLET | Freq: Two times a day (BID) | ORAL | 0 refills | Status: DC

## 2019-01-04 NOTE — ED Triage Notes (Signed)
Patient c/o vaginal irritation/ burning with urination x 2 days. Patient also c/o bump at top right side gum painful x 2 weeks.

## 2019-01-04 NOTE — ED Provider Notes (Signed)
[facility] ____________________________________________  Time seen: Approximately 9:50 AM  I have reviewed the triage vital signs and the nursing notes.   HISTORY  Chief Complaint Vaginitis    HPI Jody Perez is a 28 y.o. female presenting for 2 complaints.  Patient complains of right upper dental pain present for the last several days, stating that she has had issues with this before.  States she had an infection in the same area due to a broken cavity, but states that she missed her dental appointment and has not rescheduled.  States the area is mildly painful currently.  Has been rinsing with Listerine.  Denies trauma.  Denies sore throat, facial swelling, ear pain.  Has continued to overall eat and drink well.  Also complains of vaginal discharge and vaginal irritation present for the last 2 days.  Patient states sexually active with the same partner, but would like to have STD testing.  In discussing STD testing she requests trichomonas, gonorrhea and chlamydia.  Declines further STD testing including HIV, syphilis, herpes and hepatitis.  Denies current pregnancy.  Denies abdominal pain, back pain, vomiting or diarrhea.  Does have some burning with urination.  No urinary frequency.  Denies aggravating alleviating factors.  Present was doing well denies other complaints.  Patient's last menstrual period was 12/14/2018.Denies pregnancy.    Past Medical History:  Diagnosis Date  . BV (bacterial vaginosis)   . Chlamydia   . Herpes genitalis in women   . HSV infection   . Miscarriage   . Trichomonas   . UTI (lower urinary tract infection)     Patient Active Problem List   Diagnosis Date Noted  . Anemia 03/27/2018  . Bacterial vaginosis 11/17/2017  . History of genital HSV 01/28/2013    Past Surgical History:  Procedure Laterality Date  . NO PAST SURGERIES      Current Outpatient Rx  . Order #: [id number] Class: Normal  . Order #: [id number] Class: Normal  .  Order #: [id number] Class: Normal    Allergies Patient has no known allergies.  Family History  Problem Relation Age of Onset  . Hypertension Mother   . Healthy Brother   . Healthy Father   . Breast cancer Maternal Grandmother   . Healthy Brother   . Colon cancer Neg Hx   . Ovarian cancer Neg Hx   . Cervical cancer Neg Hx     Social History Social History   Tobacco Use  . Smoking status: Never Smoker  . Smokeless tobacco: Never Used  Substance Use Topics  . Alcohol use: No    Comment: socially  . Drug use: No    Review of Systems Constitutional: No fever/chills. Reports continues to eat and drink foods and fluids well.  Eyes: No visual changes. ENT: No sore throat. Cardiovascular: Denies chest pain. Respiratory: Denies shortness of breath. Gastrointestinal: No abdominal pain.  No nausea, no vomiting.  Genitourinary: As above.  Musculoskeletal: Negative for back pain. Skin: Negative for rash.   ____________________________________________   PHYSICAL EXAM:  VITAL SIGNS: ED Triage Vitals  Enc Vitals Group     BP 01/04/19 0931 124/83     Pulse Rate 01/04/19 0931 100     Resp 01/04/19 0931 16     Temp 01/04/19 0931 98.6 F (37 C)     Temp Source 01/04/19 0931 Oral     SpO2 01/04/19 0931 100 %     Weight 01/04/19 0933 170 lb (77.1 kg)     Height 01/04/19 0933  5\' 4"  (1.626 m)     Head Circumference --      Peak Flow --      Pain Score 01/04/19 0933 7     Pain Loc --      Pain Edu? --      Excl. in GC? --     Constitutional: Alert and oriented. Well appearing and in no acute distress. Eyes: Conjunctivae are normal.  Head: Atraumatic. Ears: nontender bilaterally.  Nose: No congestion Mouth/Throat: Mucous membranes are moist.  Oropharynx non-erythematous. Periodontal Exam   Right upper tooth cavity did #6 with gumline erythema and mild swelling above tooth, tenderness to direct palpation, no facial swelling or erythema noted, no other dental  tenderness. Neck: No stridor.  Hematological/Lymphatic/Immunilogical: No cervical lymphadenopathy. Cardiovascular:   Normal rate, regular rhythm. Grossly normal heart sounds. Good peripheral circulation. Respiratory: Normal respiratory effort.  No retractions. Musculoskeletal: No lower or upper extremity tenderness nor edema. No midline cervical, thoracic or lumbar tenderness to palpation.  Abdomen: Soft and nontender.  No CVA tenderness.   Neurologic:  Normal speech and language. Speech is normal. No gait instability. Skin:  Skin is warm, dry and intact. No rash noted. Psychiatric: Mood and affect are normal. Speech and behavior are normal.  ____________________________________________   LABS (all labs ordered are listed, but only abnormal results are displayed)  Labs Reviewed  WET PREP, GENITAL - Abnormal; Notable for the following components:      Result Value   Clue Cells Wet Prep HPF POC PRESENT (*)    WBC, Wet Prep HPF POC MANY (*)    All other components within normal limits  URINALYSIS, COMPLETE (UACMP) WITH MICROSCOPIC - Abnormal; Notable for the following components:   APPearance CLOUDY (*)    Hgb urine dipstick LARGE (*)    Protein, ur >300 (*)    Nitrite POSITIVE (*)    Leukocytes, UA MODERATE (*)    Bacteria, UA MANY (*)    All other components within normal limits  CHLAMYDIA/NGC RT PCR ([facility] ONLY)  URINE CULTURE   ____________________________________________  PROCEDURES   INITIAL IMPRESSION / ASSESSMENT AND PLAN / ED COURSE  Pertinent labs & imaging results that were available during my care of the patient were reviewed by me and considered in my medical decision making (see chart for details).  Well-appearing patient.  No acute distress.  Right upper dental pain with a cavity, suspect infection.  Will treat with oral Augmentin.  Encourage patient to follow-up with dentist as soon as possible.  Soft food, frequent rinsing mouth.  Patient also requests  gonorrhea, chlamydia and trichomonas testing.  Patient declined further STD testing.  Patient elected for self wet prep.  Urinalysis and wet prep discussed with patient.  Positive for clue cells, will treat BV with oral Flagyl.  Urinalysis positive for UTI.  We will culture.  Will treat with Augmentin and Flagyl.  Encourage supportive care, follow-up with dentist as soon as possible as well as discussed follow-up with primary care in 1 week for follow-up and recheck urine.Discussed indication, risks and benefits of medications with patient.  Patient agreed to plan.  __________________________________________   FINAL CLINICAL IMPRESSION(S) / ED DIAGNOSES  Final diagnoses:  Urinary tract infection with hematuria, site unspecified  Bacterial vaginosis  Pain, dental         Renford DillsMiller, Lurline Caver, NP 01/04/19 1109

## 2019-01-04 NOTE — Discharge Instructions (Signed)
Take medication as prescribed. Rest. Drink plenty of fluids.  No sexual activity for at least 1 week.  You need to follow-up with dentist as soon as possible.  Follow-up with your primary care in 1 week, for follow-up for urinalysis.  Return to Urgent care for new or worsening concerns.

## 2019-01-07 ENCOUNTER — Telehealth (HOSPITAL_COMMUNITY): Payer: Self-pay | Admitting: Emergency Medicine

## 2019-01-07 LAB — URINE CULTURE: Culture: >=100000 — AB

## 2019-01-07 MED ORDER — NITROFURANTOIN MONOHYD MACRO 100 MG PO CAPS: 100.0000 mg | ORAL_CAPSULE | Freq: Two times a day (BID) | ORAL | 0 refills | Status: DC

## 2019-01-07 NOTE — Telephone Encounter (Signed)
Patient contacted and made aware of all results, all questions answered. Requesting antibiotic for uti, still symptomatic.

## 2019-01-07 NOTE — Telephone Encounter (Signed)
Urine culture is positive for e coli, pt is on augmentin which is resistant to antibiotic. Per Amy PA, pt is on this medicine for dental issues, attempted to contact patient and see how she is feeling. If she does not have urinary symptoms, do not need to treat, however if she requests an antibiotic, to keep taking augmentin, and send in another medicine for UTI.   Attempted to reach patient. No answer at this time. Voicemail left.

## 2019-01-11 ENCOUNTER — Other Ambulatory Visit: Payer: Self-pay | Admitting: Family Medicine

## 2019-01-11 NOTE — Telephone Encounter (Signed)
Patient is requesting a refill on the following medication  valACYclovir (VALTREX) 1000 MG tablet   She would like this sent to [facility] on [address].

## 2019-01-13 MED ORDER — VALACYCLOVIR HCL 1 G PO TABS: 1000.0000 mg | ORAL_TABLET | Freq: Every day | ORAL | 5 refills | Status: DC

## 2019-02-19 ENCOUNTER — Other Ambulatory Visit: Payer: Self-pay

## 2019-02-19 ENCOUNTER — Emergency Department
Admission: EM | Admit: 2019-02-19 | Discharge: 2019-02-19 | Disposition: A | Discharge status: Home / Self Care | Payer: Medicaid Other | Attending: Emergency Medicine | Admitting: Emergency Medicine

## 2019-02-19 ENCOUNTER — Encounter: Payer: Self-pay | Admitting: Emergency Medicine

## 2019-02-19 DIAGNOSIS — J069 Acute upper respiratory infection, unspecified: Secondary | ICD-10-CM | POA: Diagnosis not present

## 2019-02-19 DIAGNOSIS — Z79899 Other long term (current) drug therapy: Secondary | ICD-10-CM | POA: Diagnosis not present

## 2019-02-19 DIAGNOSIS — K047 Periapical abscess without sinus: Secondary | ICD-10-CM | POA: Diagnosis not present

## 2019-02-19 DIAGNOSIS — R05 Cough: Secondary | ICD-10-CM | POA: Diagnosis present

## 2019-02-19 MED ORDER — AMOXICILLIN 875 MG PO TABS: 875.0000 mg | ORAL_TABLET | Freq: Two times a day (BID) | ORAL | 0 refills | Status: DC

## 2019-02-19 NOTE — ED Notes (Signed)
Pt ambulatory to room. Pt breathing unlabored and even.

## 2019-02-19 NOTE — ED Provider Notes (Signed)
[facility] Emergency Department Provider Note  ____________________________________________  Time seen: Approximately 9:54 PM  I have reviewed the triage vital signs and the nursing notes.   HISTORY  Chief Complaint Cough and Nasal Congestion    HPI Devon Loven is a 28 y.o. female who presents emergency department with her child who is also experiencing some minor symptoms.  Patient presents complaining of nasal congestion, nonproductive cough for 2 days.  Patient's son is also experiencing similar symptoms.  No fevers or chills, headache, neck pain or stiffness, sore throat.  No chest pain, shortness of breath, abdominal pain, nausea or vomiting.  No medications for his complaint prior to arrival.  Patient also is concerned for possible dental infection as she has had a "chipped tooth" to the right upper dentition for a while.  Patient is experiencing pain and mild edema around this tooth.  No medications for either complaint prior to arrival.  No other complaints at this time.         Past Medical History:  Diagnosis Date  . BV (bacterial vaginosis)   . Chlamydia   . Herpes genitalis in women   . HSV infection   . Migraines   . Miscarriage   . Trichomonas   . UTI (lower urinary tract infection)     Patient Active Problem List   Diagnosis Date Noted  . Anemia 03/27/2018  . Bacterial vaginosis 11/17/2017  . History of genital HSV 01/28/2013    Past Surgical History:  Procedure Laterality Date  . NO PAST SURGERIES      Prior to Admission medications   Medication Sig Start Date End Date Taking? Authorizing Provider  amoxicillin (AMOXIL) 875 MG tablet Take 1 tablet (875 mg total) by mouth 2 (two) times daily. 02/19/19   Cuthriell, Delorise Royals, PA-C  amoxicillin-clavulanate (AUGMENTIN) 875-125 MG tablet Take 1 tablet by mouth every 12 (twelve) hours. 01/04/19   Renford Dills, NP  metroNIDAZOLE (FLAGYL) 500 MG tablet Take 1 tablet (500 mg total) by  mouth 2 (two) times daily. 01/04/19   Renford Dills, NP  nitrofurantoin, macrocrystal-monohydrate, (MACROBID) 100 MG capsule Take 1 capsule (100 mg total) by mouth 2 (two) times daily. 01/07/19   Cathie Hoops, Amy V, PA-C  valACYclovir (VALTREX) 1000 MG tablet Take 1 tablet (1,000 mg total) by mouth daily. 01/13/19   Erasmo Downer, MD    Allergies Patient has no known allergies.  Family History  Problem Relation Age of Onset  . Hypertension Mother   . Hypotension Mother   . Healthy Brother   . Hypertension Brother   . Healthy Father   . Breast cancer Maternal Grandmother   . Healthy Brother   . Colon cancer Neg Hx   . Ovarian cancer Neg Hx   . Cervical cancer Neg Hx     Social History Social History   Tobacco Use  . Smoking status: Never Smoker  . Smokeless tobacco: Never Used  Substance Use Topics  . Alcohol use: No    Comment: socially  . Drug use: No     Review of Systems  Constitutional: No fever/chills Eyes: No visual changes. No discharge ENT: Positive for nasal congestion and right upper dental pain Cardiovascular: no chest pain. Respiratory: Positive cough. No SOB. Gastrointestinal: No abdominal pain.  No nausea, no vomiting.  No diarrhea.  No constipation. Musculoskeletal: Negative for musculoskeletal pain. Skin: Negative for rash, abrasions, lacerations, ecchymosis. Neurological: Negative for headaches, focal weakness or numbness. 10-point ROS otherwise negative.  ____________________________________________  PHYSICAL EXAM:  VITAL SIGNS: ED Triage Vitals  Enc Vitals Group     BP 02/19/19 2105 125/62     Pulse Rate 02/19/19 2105 78     Resp 02/19/19 2105 16     Temp 02/19/19 2105 97.9 F (36.6 C)     Temp Source 02/19/19 2105 Oral     SpO2 02/19/19 2105 98 %     Weight 02/19/19 2106 165 lb (74.8 kg)     Height 02/19/19 2106 5\' 4"  (1.626 m)     Head Circumference --      Peak Flow --      Pain Score 02/19/19 2106 0     Pain Loc --      Pain Edu?  --      Excl. in GC? --      Constitutional: Alert and oriented. Well appearing and in no acute distress. Eyes: Conjunctivae are normal. PERRL. EOMI. Head: Atraumatic. ENT:      Ears:       Nose: Mild congestion/rhinnorhea.      Mouth/Throat: Mucous membranes are moist.  Visualization of the oral cavity reveals multiple caries.  Patient has mild erythema surrounding the second molar of the right upper dentition.  No fluctuance with depression of tongue depressor.  Uvula is midline. Neck: No stridor.   Hematological/Lymphatic/Immunilogical: No cervical lymphadenopathy. Cardiovascular: Normal rate, regular rhythm. Normal S1 and S2.  Good peripheral circulation. Respiratory: Normal respiratory effort without tachypnea or retractions. Lungs CTAB. Good air entry to the bases with no decreased or absent breath sounds. Musculoskeletal: Full range of motion to all extremities. No gross deformities appreciated. Neurologic:  Normal speech and language. No gross focal neurologic deficits are appreciated.  Skin:  Skin is warm, dry and intact. No rash noted. Psychiatric: Mood and affect are normal. Speech and behavior are normal. Patient exhibits appropriate insight and judgement.   ____________________________________________   LABS (all labs ordered are listed, but only abnormal results are displayed)  Labs Reviewed - No data to display ____________________________________________  EKG   ____________________________________________  RADIOLOGY   No results found.  ____________________________________________    PROCEDURES  Procedure(s) performed:    Procedures    Medications - No data to display   ____________________________________________   INITIAL IMPRESSION / ASSESSMENT AND PLAN / ED COURSE  Pertinent labs & imaging results that were available during my care of the patient were reviewed by me and considered in my medical decision making (see chart for  details).  Review of the [place] CSRS was performed in accordance of the [facility] prior to dispensing any controlled drugs.           Patient's diagnosis is consistent with viral URI, right upper dental infection.  Patient presents emergency department with 2 complaints.  Findings are consistent with a viral URI.  No concerning findings warranting labs or imaging at this time.  Patient will be placed on antibiotics for her dental infection.  Patient is to use of control medication at home Tylenol, Motrin, over-the-counter cough medication as needed.  Follow-up primary care as needed.. Patient is given ED precautions to return to the ED for any worsening or new symptoms.  Patient's vital signs are stable with no significant tachypnea and no significant tachycardia.  PERC negative.  Low risk for ACS.  No recent high-risk travel or sick contacts that would suggest an increased risk of COVID-19.  This patient does not currently meet CDC criteria for testing and I explained that in detail.  The work-up/exam today is reassuring with no evidence of emergent medical condition that requires further work-up, evaluation, or inpatient treatment other than what has been ordered/performed.      ____________________________________________  FINAL CLINICAL IMPRESSION(S) / ED DIAGNOSES  Final diagnoses:  Viral URI  Dental infection      NEW MEDICATIONS STARTED DURING THIS VISIT:  ED Discharge Orders         Ordered    amoxicillin (AMOXIL) 875 MG tablet  2 times daily     02/19/19 2158              This chart was dictated using voice recognition software/Dragon. Despite best efforts to proofread, errors can occur which can change the meaning. Any change was purely unintentional.    Lanette Hampshire 02/19/19 2159    Rockne Menghini, MD 02/19/19 2226

## 2019-02-19 NOTE — ED Triage Notes (Signed)
Pt to ED from home c/o cough and congestion for a couple days, denies fever, chills, n/v/d, denies travel or known exposure to anyone high risk COVID.  Presents A&Ox4, chest rise even and unlabored in NAD at this time.

## 2019-03-31 ENCOUNTER — Encounter: Payer: Self-pay | Admitting: Family Medicine

## 2019-04-24 ENCOUNTER — Other Ambulatory Visit: Payer: Self-pay

## 2019-04-24 ENCOUNTER — Encounter (HOSPITAL_COMMUNITY): Payer: Self-pay

## 2019-04-24 ENCOUNTER — Ambulatory Visit (HOSPITAL_COMMUNITY)
Admission: EM | Admit: 2019-04-24 | Discharge: 2019-04-24 | Disposition: A | Discharge status: Home / Self Care | Payer: Medicaid Other | Attending: Internal Medicine | Admitting: Internal Medicine

## 2019-04-24 DIAGNOSIS — B373 Candidiasis of vulva and vagina: Secondary | ICD-10-CM

## 2019-04-24 DIAGNOSIS — K047 Periapical abscess without sinus: Secondary | ICD-10-CM

## 2019-04-24 DIAGNOSIS — B3731 Acute candidiasis of vulva and vagina: Secondary | ICD-10-CM

## 2019-04-24 LAB — POCT URINALYSIS DIP (DEVICE)
Bilirubin Urine: NEGATIVE
Glucose, UA: NEGATIVE mg/dL
Hgb urine dipstick: NEGATIVE
Ketones, ur: NEGATIVE mg/dL
Leukocytes,Ua: NEGATIVE
Nitrite: NEGATIVE
Protein, ur: NEGATIVE mg/dL
Specific Gravity, Urine: >=1.03 (ref 1.005–1.030)
Urobilinogen, UA: 0.2 mg/dL (ref 0.0–1.0)
pH: 6.5 (ref 5.0–8.0)

## 2019-04-24 MED ORDER — IBUPROFEN 600 MG PO TABS: 600.0000 mg | ORAL_TABLET | Freq: Four times a day (QID) | ORAL | 0 refills | Status: DC | PRN

## 2019-04-24 MED ORDER — PENICILLIN V POTASSIUM 500 MG PO TABS: 500.0000 mg | ORAL_TABLET | Freq: Four times a day (QID) | ORAL | 0 refills | Status: AC

## 2019-04-24 MED ORDER — FLUCONAZOLE 200 MG PO TABS: 200.0000 mg | ORAL_TABLET | Freq: Once | ORAL | 0 refills | Status: AC

## 2019-04-24 NOTE — ED Triage Notes (Signed)
Pt presents with complaints of vaginal discharge and burning when she urinates x 2 days.  Pt also presents with complaints of toothache in top right side of mouth.

## 2019-04-24 NOTE — ED Provider Notes (Signed)
[facility]    CSN: [id number] Arrival date & time: 04/24/19  1647     History   Chief Complaint Chief Complaint  Patient presents with  . Dental Pain  . Vaginal Discharge    HPI Jody Perez is a 28 y.o. female with no past medical history comes to urgent care with complaints of a 2-day history of burning with micturition and a 2-week history of dental pain.  Dental pain started about 2 weeks ago.  Pain is severe.  No known aggravating factors.  No known relieving factors.  Pain is nonradiating.  Patient also complains about vaginal discharge which is whitish in color.  It is associated with some itching.  Patient denies any superficial or deep dyspareunia.  Vaginal discharge is not foul-smelling.  HPI  Past Medical History:  Diagnosis Date  . BV (bacterial vaginosis)   . Chlamydia   . Herpes genitalis in women   . HSV infection   . Migraines   . Miscarriage   . Trichomonas   . UTI (lower urinary tract infection)     Patient Active Problem List   Diagnosis Date Noted  . Anemia 03/27/2018  . Bacterial vaginosis 11/17/2017  . History of genital HSV 01/28/2013    Past Surgical History:  Procedure Laterality Date  . NO PAST SURGERIES      OB History    Gravida  4   Para  3   Term  3   Preterm  0   AB  1   Living  3     SAB  1   TAB  0   Ectopic  0   Multiple  0   Live Births  3            Home Medications    Prior to Admission medications   Medication Sig Start Date End Date Taking? Authorizing Provider  fluconazole (DIFLUCAN) 200 MG tablet Take 1 tablet (200 mg total) by mouth once for 1 dose. May repeat in 72 hours if no improvement. 04/24/19 04/24/19  Merrilee JanskyLamptey, Malerie Eakins O, MD  ibuprofen (ADVIL) 600 MG tablet Take 1 tablet (600 mg total) by mouth every 6 (six) hours as needed. 04/24/19   Moriyah Byington, Britta MccreedyPhilip O, MD  penicillin v potassium (VEETID) 500 MG tablet Take 1 tablet (500 mg total) by mouth 4 (four) times daily for 7 days.  04/24/19 05/01/19  Merrilee JanskyLamptey, Amorah Sebring O, MD  valACYclovir (VALTREX) 1000 MG tablet Take 1 tablet (1,000 mg total) by mouth daily. 01/13/19   Erasmo DownerBacigalupo, Angela M, MD    Family History Family History  Problem Relation Age of Onset  . Hypertension Mother   . Hypotension Mother   . Healthy Brother   . Hypertension Brother   . Healthy Father   . Breast cancer Maternal Grandmother   . Healthy Brother   . Colon cancer Neg Hx   . Ovarian cancer Neg Hx   . Cervical cancer Neg Hx     Social History Social History   Tobacco Use  . Smoking status: Never Smoker  . Smokeless tobacco: Never Used  Substance Use Topics  . Alcohol use: No    Comment: socially  . Drug use: No     Allergies   Patient has no known allergies.   Review of Systems Review of Systems  Constitutional: Negative for activity change and appetite change.  HENT: Negative.   Respiratory: Negative for chest tightness, shortness of breath and wheezing.   Cardiovascular: Negative.  Gastrointestinal: Negative.   Genitourinary: Positive for dysuria and vaginal discharge. Negative for dyspareunia, flank pain, frequency, urgency and vaginal bleeding.  Musculoskeletal: Negative.   Skin: Negative for rash and wound.  Neurological: Negative for dizziness, weakness and numbness.     Physical Exam Triage Vital Signs ED Triage Vitals [04/24/19 1721]  Enc Vitals Group     BP 121/78     Pulse Rate 96     Resp 18     Temp 98 F (36.7 C)     Temp src      SpO2 98 %     Weight      Height      Head Circumference      Peak Flow      Pain Score 8     Pain Loc      Pain Edu?      Excl. in GC?    No data found.  Updated Vital Signs BP 121/78   Pulse 96   Temp 98 F (36.7 C)   Resp 18   LMP 03/19/2019   SpO2 98%   Visual Acuity Right Eye Distance:   Left Eye Distance:   Bilateral Distance:    Right Eye Near:   Left Eye Near:    Bilateral Near:     Physical Exam Constitutional:      Appearance: Normal  appearance.  HENT:     Mouth/Throat:     Pharynx: Oropharynx is clear. No oropharyngeal exudate or posterior oropharyngeal erythema.  Eyes:     Conjunctiva/sclera: Conjunctivae normal.  Cardiovascular:     Rate and Rhythm: Normal rate and regular rhythm.     Pulses: Normal pulses.     Heart sounds: Normal heart sounds.  Pulmonary:     Effort: Pulmonary effort is normal.     Breath sounds: Normal breath sounds.  Abdominal:     General: Bowel sounds are normal.     Palpations: Abdomen is soft.  Musculoskeletal: Normal range of motion.  Skin:    Capillary Refill: Capillary refill takes less than 2 seconds.  Neurological:     General: No focal deficit present.     Mental Status: She is alert.      UC Treatments / Results  Labs (all labs ordered are listed, but only abnormal results are displayed) Labs Reviewed  POCT URINALYSIS DIP (DEVICE)    EKG None  Radiology No results found.  Procedures Procedures (including critical care time)  Medications Ordered in UC Medications - No data to display  Initial Impression / Assessment and Plan / UC Course  I have reviewed the triage vital signs and the nursing notes.  Pertinent labs & imaging results that were available during my care of the patient were reviewed by me and considered in my medical decision making (see chart for details).     1.  Vaginal yeast infection: Fluconazole 200 mg now, may be repeated in 72 hours if no improvement Urinalysis is negative for urinary tract infection.Urine specific gravity is greater than 1.030  2.  Pulpitis: Penicillin the 500 mg 3 times daily for 7 days Ibuprofen 600 mg 3 times daily as needed for pain Final Clinical Impressions(s) / UC Diagnoses   Final diagnoses:  Dental infection  Vaginal yeast infection   Discharge Instructions   None    ED Prescriptions    Medication Sig Dispense Auth. Provider   penicillin v potassium (VEETID) 500 MG tablet Take 1 tablet (500 mg  total) by mouth  4 (four) times daily for 7 days. 40 tablet Germani Gavilanes, Britta Mccreedy, MD   ibuprofen (ADVIL) 600 MG tablet Take 1 tablet (600 mg total) by mouth every 6 (six) hours as needed. 30 tablet Neela Zecca, Britta Mccreedy, MD   fluconazole (DIFLUCAN) 200 MG tablet Take 1 tablet (200 mg total) by mouth once for 1 dose. May repeat in 72 hours if no improvement. 2 tablet Antaeus Karel, Britta Mccreedy, MD     Controlled Substance Prescriptions [place] Controlled Substance Registry consulted? No   Merrilee Jansky, MD 04/24/19 [phone]

## 2019-05-12 ENCOUNTER — Other Ambulatory Visit: Payer: Self-pay

## 2019-05-12 ENCOUNTER — Ambulatory Visit
Admission: EM | Admit: 2019-05-12 | Discharge: 2019-05-12 | Disposition: A | Discharge status: Home / Self Care | Payer: Medicaid Other | Attending: Family Medicine | Admitting: Family Medicine

## 2019-05-12 DIAGNOSIS — N760 Acute vaginitis: Secondary | ICD-10-CM | POA: Insufficient documentation

## 2019-05-12 DIAGNOSIS — B9689 Other specified bacterial agents as the cause of diseases classified elsewhere: Secondary | ICD-10-CM

## 2019-05-12 DIAGNOSIS — Z202 Contact with and (suspected) exposure to infections with a predominantly sexual mode of transmission: Secondary | ICD-10-CM | POA: Insufficient documentation

## 2019-05-12 LAB — CHLAMYDIA/NGC RT PCR (ARMC ONLY)
Chlamydia Tr: NOT DETECTED
N gonorrhoeae: NOT DETECTED

## 2019-05-12 LAB — WET PREP, GENITAL
Sperm: NONE SEEN
Trich, Wet Prep: NONE SEEN
Yeast Wet Prep HPF POC: NONE SEEN

## 2019-05-12 MED ORDER — FLUCONAZOLE 150 MG PO TABS: 150.0000 mg | ORAL_TABLET | Freq: Once | ORAL | 0 refills | Status: AC

## 2019-05-12 MED ORDER — METRONIDAZOLE 500 MG PO TABS: 500.0000 mg | ORAL_TABLET | Freq: Two times a day (BID) | ORAL | 0 refills | Status: DC

## 2019-05-12 NOTE — ED Provider Notes (Signed)
[facility]    CSN: [id number] Arrival date & time: 05/12/19  1307   History   Chief Complaint Chief Complaint  Patient presents with  . Exposure to STD    HPI  28 year old female presents with vaginal discharge and possible exposure to STD.  Patient reports a 2-day history of vaginal discharge.  Denies itching.  She does report irritation.  No abdominal pain.  No fever.  Patient was recently informed that her baby's father has been exposed to chlamydia.  She desires STD screening today.  No medications or interventions tried.  No other associated symptoms.  No complaints.  PMH, Surgical Hx, Family Hx, Social History reviewed and updated as below.  Past Medical History:  Diagnosis Date  . BV (bacterial vaginosis)   . Chlamydia   . Herpes genitalis in women   . HSV infection   . Migraines   . Miscarriage   . Trichomonas   . UTI (lower urinary tract infection)     Patient Active Problem List   Diagnosis Date Noted  . Anemia 03/27/2018  . Bacterial vaginosis 11/17/2017  . History of genital HSV 01/28/2013    Past Surgical History:  Procedure Laterality Date  . NO PAST SURGERIES      OB History    Gravida  4   Para  3   Term  3   Preterm  0   AB  1   Living  3     SAB  1   TAB  0   Ectopic  0   Multiple  0   Live Births  3            Home Medications    Prior to Admission medications   Medication Sig Start Date End Date Taking? Authorizing Provider  ibuprofen (ADVIL) 600 MG tablet Take 1 tablet (600 mg total) by mouth every 6 (six) hours as needed. 04/24/19  Yes Lamptey, Myrene Galas, MD  fluconazole (DIFLUCAN) 150 MG tablet Take 1 tablet (150 mg total) by mouth once for 1 dose. Repeat dose in 72 hours. 05/12/19 05/12/19  Coral Spikes, DO  metroNIDAZOLE (FLAGYL) 500 MG tablet Take 1 tablet (500 mg total) by mouth 2 (two) times daily. 05/12/19   Coral Spikes, DO  valACYclovir (VALTREX) 1000 MG tablet Take 1 tablet (1,000 mg total) by  mouth daily. 01/13/19   Virginia Crews, MD    Family History Family History  Problem Relation Age of Onset  . Hypertension Mother   . Hypotension Mother   . Healthy Brother   . Hypertension Brother   . Healthy Father   . Breast cancer Maternal Grandmother   . Healthy Brother   . Colon cancer Neg Hx   . Ovarian cancer Neg Hx   . Cervical cancer Neg Hx     Social History Social History   Tobacco Use  . Smoking status: Never Smoker  . Smokeless tobacco: Never Used  Substance Use Topics  . Alcohol use: No    Comment: socially  . Drug use: No     Allergies   Patient has no known allergies.   Review of Systems Review of Systems  Constitutional: Negative.   Gastrointestinal: Negative.   Genitourinary: Positive for vaginal discharge.   Physical Exam Triage Vital Signs ED Triage Vitals  Enc Vitals Group     BP 05/12/19 1327 127/83     Pulse Rate 05/12/19 1327 76     Resp 05/12/19 1327 18  Temp 05/12/19 1327 98.3 F (36.8 C)     Temp Source 05/12/19 1327 Oral     SpO2 05/12/19 1327 100 %     Weight 05/12/19 1324 170 lb (77.1 kg)     Height 05/12/19 1324 5\' 5"  (1.651 m)     Head Circumference --      Peak Flow --      Pain Score 05/12/19 1324 0     Pain Loc --      Pain Edu? --      Excl. in GC? --    Updated Vital Signs BP 127/83 (BP Location: Left Arm)   Pulse 76   Temp 98.3 F (36.8 C) (Oral)   Resp 18   Ht 5\' 5"  (1.651 m)   Wt 77.1 kg   LMP 05/05/2019   SpO2 100%   Breastfeeding No   BMI 28.29 kg/m   Visual Acuity Right Eye Distance:   Left Eye Distance:   Bilateral Distance:    Right Eye Near:   Left Eye Near:    Bilateral Near:     Physical Exam Vitals signs and nursing note reviewed.  Constitutional:      General: She is not in acute distress.    Appearance: Normal appearance.  HENT:     Head: Normocephalic and atraumatic.  Eyes:     General:        Right eye: No discharge.        Left eye: No discharge.      Conjunctiva/sclera: Conjunctivae normal.  Cardiovascular:     Rate and Rhythm: Normal rate and regular rhythm.  Pulmonary:     Effort: Pulmonary effort is normal. No respiratory distress.  Abdominal:     General: There is no distension.     Palpations: Abdomen is soft.     Tenderness: There is no abdominal tenderness.  Neurological:     Mental Status: She is alert.  Psychiatric:        Mood and Affect: Mood normal.        Behavior: Behavior normal.    UC Treatments / Results  Labs (all labs ordered are listed, but only abnormal results are displayed) Labs Reviewed  WET PREP, GENITAL - Abnormal; Notable for the following components:      Result Value   Clue Cells Wet Prep HPF POC PRESENT (*)    WBC, Wet Prep HPF POC FEW (*)    All other components within normal limits  CHLAMYDIA/NGC RT PCR [facility] ONLY)    EKG None  Radiology No results found.  Procedures Procedures (including critical care time)  Medications Ordered in UC Medications - No data to display  Initial Impression / Assessment and Plan / UC Course  I have reviewed the triage vital signs and the nursing notes.  Pertinent labs & imaging results that were available during my care of the patient were reviewed by me and considered in my medical decision making (see chart for details).    28 year old female presents with bacterial vaginosis.  Patient states that she often gets yeast infection with treatment with Flagyl.  Sending in a Flagyl as well as Diflucan.  Awaiting STD testing results.  Final Clinical Impressions(s) / UC Diagnoses   Final diagnoses:  BV (bacterial vaginosis)  Possible exposure to STD     Discharge Instructions     We will call with your results.  Medications as prescribed.  Take care  Dr. Adriana Simasook    ED Prescriptions  Medication Sig Dispense Auth. Provider   fluconazole (DIFLUCAN) 150 MG tablet Take 1 tablet (150 mg total) by mouth once for 1 dose. Repeat dose in 72  hours. 2 tablet Jahmiyah Dullea G, DO   metroNIDAZOLE (FLAGYL) 500 MG tablet Take 1 tablet (500 mg total) by mouth 2 (two) times daily. 14 tablet Tommie Samsook, Osualdo Hansell G, DO     Controlled Substance Prescriptions [place] Controlled Substance Registry consulted? Not Applicable   Tommie SamsCook, Martell Mcfadyen G, DO 05/12/19 1433

## 2019-05-12 NOTE — ED Triage Notes (Signed)
Patient complains of vaginal discharge that started 2 days ago. Patient states that her partner got a text stating that they needed to be checked for chlamydia.

## 2019-05-12 NOTE — Discharge Instructions (Signed)
We will call with your results.  Medications as prescribed.  Take care  Dr. Lacinda Axon

## 2019-05-30 ENCOUNTER — Ambulatory Visit
Admission: EM | Admit: 2019-05-30 | Discharge: 2019-05-30 | Disposition: A | Discharge status: Home / Self Care | Payer: Medicaid Other | Attending: Family Medicine | Admitting: Family Medicine

## 2019-05-30 ENCOUNTER — Other Ambulatory Visit: Payer: Self-pay

## 2019-05-30 ENCOUNTER — Encounter: Payer: Self-pay | Admitting: Emergency Medicine

## 2019-05-30 DIAGNOSIS — B3731 Acute candidiasis of vulva and vagina: Secondary | ICD-10-CM

## 2019-05-30 DIAGNOSIS — B373 Candidiasis of vulva and vagina: Secondary | ICD-10-CM | POA: Diagnosis not present

## 2019-05-30 LAB — WET PREP, GENITAL
Clue Cells Wet Prep HPF POC: NONE SEEN
Sperm: NONE SEEN
Trich, Wet Prep: NONE SEEN

## 2019-05-30 MED ORDER — FLUCONAZOLE 150 MG PO TABS: 150.0000 mg | ORAL_TABLET | Freq: Once | ORAL | 0 refills | Status: AC

## 2019-05-30 NOTE — ED Provider Notes (Signed)
[facility]    CSN: [id number] Arrival date & time: 05/30/19  1055     History   Chief Complaint Chief Complaint  Patient presents with  . Vaginal Discharge    HPI Jody Perez is a 28 y.o. female.   28 yo female with a c/o vaginal discharge, irritation and itching for the past 2 days. Denies any fevers, chills, pain.    Vaginal Discharge   Past Medical History:  Diagnosis Date  . BV (bacterial vaginosis)   . Chlamydia   . Herpes genitalis in women   . HSV infection   . Migraines   . Miscarriage   . Trichomonas   . UTI (lower urinary tract infection)     Patient Active Problem List   Diagnosis Date Noted  . Anemia 03/27/2018  . Bacterial vaginosis 11/17/2017  . History of genital HSV 01/28/2013    Past Surgical History:  Procedure Laterality Date  . NO PAST SURGERIES    . WISDOM TOOTH EXTRACTION      OB History    Gravida  4   Para  3   Term  3   Preterm  0   AB  1   Living  3     SAB  1   TAB  0   Ectopic  0   Multiple  0   Live Births  3            Home Medications    Prior to Admission medications   Medication Sig Start Date End Date Taking? Authorizing Provider  fluconazole (DIFLUCAN) 150 MG tablet Take 1 tablet (150 mg total) by mouth once for 1 dose. May repeat in 1 week 05/30/19 05/30/19  Payton Mccallumonty, Trevaris Pennella, MD  ibuprofen (ADVIL) 600 MG tablet Take 1 tablet (600 mg total) by mouth every 6 (six) hours as needed. 04/24/19   Lamptey, Britta MccreedyPhilip O, MD  metroNIDAZOLE (FLAGYL) 500 MG tablet Take 1 tablet (500 mg total) by mouth 2 (two) times daily. 05/12/19   Tommie Samsook, Jayce G, DO  valACYclovir (VALTREX) 1000 MG tablet Take 1 tablet (1,000 mg total) by mouth daily. 01/13/19   Erasmo DownerBacigalupo, Angela M, MD    Family History Family History  Problem Relation Age of Onset  . Hypertension Mother   . Hypotension Mother   . Healthy Brother   . Hypertension Brother   . Healthy Father   . Breast cancer Maternal Grandmother   . Healthy  Brother   . Colon cancer Neg Hx   . Ovarian cancer Neg Hx   . Cervical cancer Neg Hx     Social History Social History   Tobacco Use  . Smoking status: Never Smoker  . Smokeless tobacco: Never Used  Substance Use Topics  . Alcohol use: No    Comment: socially  . Drug use: No     Allergies   Patient has no known allergies.   Review of Systems Review of Systems  Genitourinary: Positive for vaginal discharge.     Physical Exam Triage Vital Signs ED Triage Vitals  Enc Vitals Group     BP 05/30/19 1121 115/78     Pulse Rate 05/30/19 1121 64     Resp 05/30/19 1121 14     Temp 05/30/19 1121 98.4 F (36.9 C)     Temp Source 05/30/19 1121 Oral     SpO2 05/30/19 1121 100 %     Weight 05/30/19 1117 165 lb (74.8 kg)     Height 05/30/19  1117 5\' 5"  (1.651 m)     Head Circumference --      Peak Flow --      Pain Score 05/30/19 1117 0     Pain Loc --      Pain Edu? --      Excl. in [place]? --    No data found.  Updated Vital Signs BP 115/78 (BP Location: Right Arm)   Pulse 64   Temp 98.4 F (36.9 C) (Oral)   Resp 14   Ht 5\' 5"  (1.651 m)   Wt 74.8 kg   LMP 05/05/2019   SpO2 100%   Breastfeeding No   BMI 27.46 kg/m   Visual Acuity Right Eye Distance:   Left Eye Distance:   Bilateral Distance:    Right Eye Near:   Left Eye Near:    Bilateral Near:     Physical Exam Vitals signs and nursing note reviewed.  Constitutional:      General: She is not in acute distress.    Appearance: She is not toxic-appearing or diaphoretic.  Neurological:     Mental Status: She is alert.      UC Treatments / Results  Labs (all labs ordered are listed, but only abnormal results are displayed) Labs Reviewed  WET PREP, GENITAL - Abnormal; Notable for the following components:      Result Value   Yeast Wet Prep HPF POC PRESENT (*)    WBC, Wet Prep HPF POC FEW (*)    All other components within normal limits    EKG   Radiology No results found.  Procedures  Procedures (including critical care time)  Medications Ordered in UC Medications - No data to display  Initial Impression / Assessment and Plan / UC Course  I have reviewed the triage vital signs and the nursing notes.  Pertinent labs & imaging results that were available during my care of the patient were reviewed by me and considered in my medical decision making (see chart for details).      Final Clinical Impressions(s) / UC Diagnoses   Final diagnoses:  Yeast vaginitis    ED Prescriptions    Medication Sig Dispense Auth. Provider   fluconazole (DIFLUCAN) 150 MG tablet Take 1 tablet (150 mg total) by mouth once for 1 dose. May repeat in 1 week 2 tablet Norval Gable, MD     1. Lab results and diagnosis reviewed with patient 2. rx as per orders above; reviewed possible side effects, interactions, risks and benefits  3. Follow-up prn if symptoms worsen or don't improve   Controlled Substance Prescriptions [place] Controlled Substance Registry consulted? Not Applicable   Norval Gable, MD 05/30/19 1149

## 2019-05-30 NOTE — ED Triage Notes (Signed)
Patient c/o vaginal discharge and vaginal irriattion that started 2 days ago.  Patient denies any pain.  Patient denies any fever.   Patient denies N/V.

## 2019-06-06 ENCOUNTER — Ambulatory Visit: Payer: Medicaid Other | Admitting: Family Medicine

## 2019-06-06 ENCOUNTER — Other Ambulatory Visit (HOSPITAL_COMMUNITY)
Admission: RE | Admit: 2019-06-06 | Discharge: 2019-06-06 | Disposition: A | Discharge status: Home / Self Care | Payer: Medicaid Other | Source: Ambulatory Visit | Attending: Family Medicine | Admitting: Family Medicine

## 2019-06-06 ENCOUNTER — Encounter: Payer: Self-pay | Admitting: Family Medicine

## 2019-06-06 ENCOUNTER — Other Ambulatory Visit: Payer: Self-pay

## 2019-06-06 VITALS — BP 128/66 | HR 84 | Temp 98.7°F | Ht 65.0 in | Wt 161.4 lb

## 2019-06-06 DIAGNOSIS — Z862 Personal history of diseases of the blood and blood-forming organs and certain disorders involving the immune mechanism: Secondary | ICD-10-CM | POA: Diagnosis not present

## 2019-06-06 DIAGNOSIS — N898 Other specified noninflammatory disorders of vagina: Secondary | ICD-10-CM

## 2019-06-06 DIAGNOSIS — Z0000 Encounter for general adult medical examination without abnormal findings: Secondary | ICD-10-CM

## 2019-06-06 DIAGNOSIS — E663 Overweight: Secondary | ICD-10-CM

## 2019-06-06 DIAGNOSIS — Z113 Encounter for screening for infections with a predominantly sexual mode of transmission: Secondary | ICD-10-CM | POA: Diagnosis not present

## 2019-06-06 DIAGNOSIS — N760 Acute vaginitis: Secondary | ICD-10-CM | POA: Diagnosis not present

## 2019-06-06 NOTE — Assessment & Plan Note (Signed)
Patient with several vaginal screenings over the last 6 months She has a recurrent history of BV Had a long discussion with the patient regarding hygiene and avoidance of certain products Also discussed the importance of cotton underwear and avoiding excessive moisture She does have discharge on exam today and swab was taken to check for trichomonas, gonorrhea, chlamydia, BV, yeast Discussed safe sex practices Treatment pending results It is possible that if she continues to have recurrent BV, she may need MetroGel or clindamycin gel more regularly

## 2019-06-06 NOTE — Assessment & Plan Note (Signed)
Screening labs today

## 2019-06-06 NOTE — Patient Instructions (Signed)
Preventive Care 17-27 Years Old, Female Preventive care refers to visits with your health care provider and lifestyle choices that can promote health and wellness. This includes:  A yearly physical exam. This may also be called an annual well check.  Regular dental visits and eye exams.  Immunizations.  Screening for certain conditions.  Healthy lifestyle choices, such as eating a healthy diet, getting regular exercise, not using drugs or products that contain nicotine and tobacco, and limiting alcohol use. What can I expect for my preventive care visit? Physical exam Your health care provider will check your:  Height and weight. This may be used to calculate body mass index (BMI), which tells if you are at a healthy weight.  Heart rate and blood pressure.  Skin for abnormal spots. Counseling Your health care provider may ask you questions about your:  Alcohol, tobacco, and drug use.  Emotional well-being.  Home and relationship well-being.  Sexual activity.  Eating habits.  Work and work Statistician.  Method of birth control.  Menstrual cycle.  Pregnancy history. What immunizations do I need?  Influenza (flu) vaccine  This is recommended every year. Tetanus, diphtheria, and pertussis (Tdap) vaccine  You may need a Td booster every 10 years. Varicella (chickenpox) vaccine  You may need this if you have not been vaccinated. Human papillomavirus (HPV) vaccine  If recommended by your health care provider, you may need three doses over 6 months. Measles, mumps, and rubella (MMR) vaccine  You may need at least one dose of MMR. You may also need a second dose. Meningococcal conjugate (MenACWY) vaccine  One dose is recommended if you are age 64-21 years and a first-year college student living in a residence hall, or if you have one of several medical conditions. You may also need additional booster doses. Pneumococcal conjugate (PCV13) vaccine  You may need  this if you have certain conditions and were not previously vaccinated. Pneumococcal polysaccharide (PPSV23) vaccine  You may need one or two doses if you smoke cigarettes or if you have certain conditions. Hepatitis A vaccine  You may need this if you have certain conditions or if you travel or work in places where you may be exposed to hepatitis A. Hepatitis B vaccine  You may need this if you have certain conditions or if you travel or work in places where you may be exposed to hepatitis B. Haemophilus influenzae type b (Hib) vaccine  You may need this if you have certain conditions. You may receive vaccines as individual doses or as more than one vaccine together in one shot (combination vaccines). Talk with your health care provider about the risks and benefits of combination vaccines. What tests do I need?  Blood tests  Lipid and cholesterol levels. These may be checked every 5 years starting at age 54.  Hepatitis C test.  Hepatitis B test. Screening  Diabetes screening. This is done by checking your blood sugar (glucose) after you have not eaten for a while (fasting).  Sexually transmitted disease (STD) testing.  BRCA-related cancer screening. This may be done if you have a family history of breast, ovarian, tubal, or peritoneal cancers.  Pelvic exam and Pap test. This may be done every 3 years starting at age 17. Starting at age 77, this may be done every 5 years if you have a Pap test in combination with an HPV test. Talk with your health care provider about your test results, treatment options, and if necessary, the need for more tests.  Follow these instructions at home: Eating and drinking   Eat a diet that includes fresh fruits and vegetables, whole grains, lean protein, and low-fat dairy.  Take vitamin and mineral supplements as recommended by your health care provider.  Do not drink alcohol if: ? Your health care provider tells you not to drink. ? You are  pregnant, may be pregnant, or are planning to become pregnant.  If you drink alcohol: ? Limit how much you have to 0-1 drink a day. ? Be aware of how much alcohol is in your drink. In the [place] one drink equals one 12 oz bottle of beer (355 mL), one 5 oz glass of wine (148 mL), or one 1 oz glass of hard liquor (44 mL). Lifestyle  Take daily care of your teeth and gums.  Stay active. Exercise for at least 30 minutes on 5 or more days each week.  Do not use any products that contain nicotine or tobacco, such as cigarettes, e-cigarettes, and chewing tobacco. If you need help quitting, ask your health care provider.  If you are sexually active, practice safe sex. Use a condom or other form of birth control (contraception) in order to prevent pregnancy and STIs (sexually transmitted infections). If you plan to become pregnant, see your health care provider for a preconception visit. What's next?  Visit your health care provider once a year for a well check visit.  Ask your health care provider how often you should have your eyes and teeth checked.  Stay up to date on all vaccines. This information is not intended to replace advice given to you by your health care provider. Make sure you discuss any questions you have with your health care provider. Document Released: 01/09/2002 Document Revised: 07/25/2018 Document Reviewed: 07/25/2018 Elsevier Patient Education  2020 Elsevier Inc.  

## 2019-06-06 NOTE — Progress Notes (Signed)
Patient: Jody Perez, Female    DOB: 08/28/1991, 28 y.o.   MRN: [id number] Visit Date: 06/06/2019  Today's Provider: Lavon Paganini, MD   Chief Complaint  Patient presents with  . Annual Exam   Subjective:    Annual physical exam Jody Perez is a 28 y.o. female who presents today for health maintenance and complete physical. She feels well. She reports exercising none. She reports she is sleeping well.   Vaginal discharge with h/o trichomonas and recurrent BV Having more discharge again Denies any new sexual partners ----------------------------------------------------------------- Last Pap:11/12/2018  Review of Systems  Constitutional: Negative.   HENT: Negative.   Eyes: Negative.   Respiratory: Negative.   Cardiovascular: Negative.   Gastrointestinal: Negative.   Endocrine: Negative.   Genitourinary: Negative.   Musculoskeletal: Negative.   Skin: Positive for rash.  Allergic/Immunologic: Negative.   Neurological: Negative.   Hematological: Negative.   Psychiatric/Behavioral: Negative.     Social History She  reports that she has never smoked. She has never used smokeless tobacco. She reports that she does not drink alcohol or use drugs. Social History   Socioeconomic History  . Marital status: Single    Spouse name: Not on file  . Number of children: 3  . Years of education: 38  . Highest education level: High school graduate  Occupational History  . Occupation: unemployed  Social Needs  . Financial resource strain: Not on file  . Food insecurity    Worry: Not on file    Inability: Not on file  . Transportation needs    Medical: Not on file    Non-medical: Not on file  Tobacco Use  . Smoking status: Never Smoker  . Smokeless tobacco: Never Used  Substance and Sexual Activity  . Alcohol use: No    Comment: socially  . Drug use: No  . Sexual activity: Yes  Lifestyle  . Physical activity    Days per week: Not on file    Minutes per  session: Not on file  . Stress: Not on file  Relationships  . Social Herbalist on phone: Not on file    Gets together: Not on file    Attends religious service: Not on file    Active member of club or organization: Not on file    Attends meetings of clubs or organizations: Not on file    Relationship status: Not on file  Other Topics Concern  . Not on file  Social History Narrative  . Not on file    Patient Active Problem List   Diagnosis Date Noted  . Anemia 03/27/2018  . Bacterial vaginosis 11/17/2017  . History of genital HSV 01/28/2013    Past Surgical History:  Procedure Laterality Date  . NO PAST SURGERIES    . WISDOM TOOTH EXTRACTION      Family History  Family Status  Relation Name Status  . Mother  Alive  . Brother  Alive  . Father  Alive  . MGM  Deceased  . Brother  Alive  . Neg Hx  (Not Specified)   Her family history includes Breast cancer in her maternal grandmother; Healthy in her brother, brother, and father; Hypertension in her brother and mother; Hypotension in her mother.     No Known Allergies  Previous Medications   IBUPROFEN (ADVIL) 600 MG TABLET    Take 1 tablet (600 mg total) by mouth every 6 (six) hours as needed.   VALACYCLOVIR (  VALTREX) 1000 MG TABLET    Take 1 tablet (1,000 mg total) by mouth daily.    Patient Care Team: Erasmo DownerBacigalupo, Angela M, MD as PCP - General (Family Medicine)      Objective:   Vitals: BP 128/66 (BP Location: Left Arm, Patient Position: Sitting, Cuff Size: Normal)   Pulse 84   Temp 98.7 F (37.1 C) (Oral)   Ht 5\' 5"  (1.651 m)   Wt 161 lb 6.4 oz (73.2 kg)   LMP 06/01/2019   SpO2 99%   BMI 26.86 kg/m    Physical Exam Vitals signs reviewed.  Constitutional:      General: She is not in acute distress.    Appearance: Normal appearance. She is well-developed. She is not diaphoretic.  HENT:     Head: Normocephalic and atraumatic.     Right Ear: Tympanic membrane, ear canal and external ear  normal.     Left Ear: Tympanic membrane, ear canal and external ear normal.  Eyes:     General: No scleral icterus.    Extraocular Movements: Extraocular movements intact.     Conjunctiva/sclera: Conjunctivae normal.     Pupils: Pupils are equal, round, and reactive to light.  Neck:     Musculoskeletal: Neck supple.     Thyroid: No thyromegaly.  Cardiovascular:     Rate and Rhythm: Normal rate and regular rhythm.     Pulses: Normal pulses.     Heart sounds: Normal heart sounds. No murmur.  Pulmonary:     Effort: Pulmonary effort is normal. No respiratory distress.     Breath sounds: Normal breath sounds. No wheezing or rales.  Abdominal:     General: There is no distension.     Palpations: Abdomen is soft.     Tenderness: There is no abdominal tenderness.  Genitourinary:    Comments: GYN:  External genitalia within normal limits.  Vaginal mucosa pink, moist, normal rugae.  Nonfriable cervix without lesions, no bleeding, but some yellow discharge noted on speculum exam. Musculoskeletal:        General: No deformity.     Right lower leg: No edema.     Left lower leg: No edema.  Lymphadenopathy:     Cervical: No cervical adenopathy.  Skin:    General: Skin is warm and dry.     Capillary Refill: Capillary refill takes less than 2 seconds.     Findings: No rash.  Neurological:     Mental Status: She is alert and oriented to person, place, and time. Mental status is at baseline.  Psychiatric:        Mood and Affect: Mood normal.        Behavior: Behavior normal.        Thought Content: Thought content normal.      Depression Screen PHQ 2/9 Scores 06/06/2019 03/27/2018  PHQ - 2 Score 0 0  PHQ- 9 Score 0 -      Assessment & Plan:     Routine Health Maintenance and Physical Exam  Exercise Activities and Dietary recommendations Goals   None     Immunization History  Administered Date(s) Administered  . Tdap 07/30/2013, 09/27/2016, 09/18/2017    Health Maintenance   Topic Date Due  . INFLUENZA VACCINE  06/28/2019  . PAP-Cervical Cytology Screening  11/12/2021  . PAP SMEAR-Modifier  11/12/2021  . TETANUS/TDAP  09/19/2027  . HIV Screening  Completed     Discussed health benefits of physical activity, and encouraged her to engage in regular  exercise appropriate for her age and condition.    --------------------------------------------------------------------  Problem List Items Addressed This Visit      Genitourinary   Recurrent vaginitis    Patient with several vaginal screenings over the last 6 months She has a recurrent history of BV Had a long discussion with the patient regarding hygiene and avoidance of certain products Also discussed the importance of cotton underwear and avoiding excessive moisture She does have discharge on exam today and swab was taken to check for trichomonas, gonorrhea, chlamydia, BV, yeast Discussed safe sex practices Treatment pending results It is possible that if she continues to have recurrent BV, she may need MetroGel or clindamycin gel more regularly      Relevant Orders   Cervicovaginal ancillary only     Other   Overweight    Screening labs today      Relevant Orders   Comprehensive metabolic panel   CBC w/Diff/Platelet   Lipid panel    Other Visit Diagnoses    Encounter for annual physical exam    -  Primary   Relevant Orders   Comprehensive metabolic panel   CBC w/Diff/Platelet   Lipid panel   Vaginal discharge       Relevant Orders   Cervicovaginal ancillary only   History of anemia       Relevant Orders   CBC w/Diff/Platelet   Screen for STD (sexually transmitted disease)       Relevant Orders   HIV antibody (with reflex)   RPR   Cervicovaginal ancillary only       Return in about 1 year (around 06/05/2020) for CPE.   The entirety of the information documented in the History of Present Illness, Review of Systems and Physical Exam were personally obtained by me. Portions of  this information were initially documented by [facility]ha McClurkin, CMA and reviewed by me for thoroughness and accuracy.    Bacigalupo, Marzella SchleinAngela M, MD MPH [facility]

## 2019-06-07 LAB — CBC WITH DIFFERENTIAL/PLATELET
Basophils Absolute: 0.1 10*3/uL (ref 0.0–0.2)
Basos: 1 %
EOS (ABSOLUTE): 0.2 10*3/uL (ref 0.0–0.4)
Eos: 3 %
Hematocrit: 36.6 % (ref 34.0–46.6)
Hemoglobin: 11.9 g/dL (ref 11.1–15.9)
Immature Grans (Abs): 0 10*3/uL (ref 0.0–0.1)
Immature Granulocytes: 0 %
Lymphocytes Absolute: 2.3 10*3/uL (ref 0.7–3.1)
Lymphs: 28 %
MCH: 30.1 pg (ref 26.6–33.0)
MCHC: 32.5 g/dL (ref 31.5–35.7)
MCV: 93 fL (ref 79–97)
Monocytes Absolute: 0.4 10*3/uL (ref 0.1–0.9)
Monocytes: 5 %
Neutrophils Absolute: 5.2 10*3/uL (ref 1.4–7.0)
Neutrophils: 63 %
Platelets: 300 10*3/uL (ref 150–450)
RBC: 3.95 x10E6/uL (ref 3.77–5.28)
RDW: 13.2 % (ref 11.7–15.4)
WBC: 8.2 10*3/uL (ref 3.4–10.8)

## 2019-06-07 LAB — COMPREHENSIVE METABOLIC PANEL
ALT: 7 IU/L (ref 0–32)
AST: 15 IU/L (ref 0–40)
Albumin/Globulin Ratio: 1.5 (ref 1.2–2.2)
Albumin: 4.3 g/dL (ref 3.9–5.0)
Alkaline Phosphatase: 71 IU/L (ref 39–117)
BUN/Creatinine Ratio: 13 (ref 9–23)
BUN: 12 mg/dL (ref 6–20)
Bilirubin Total: 0.3 mg/dL (ref 0.0–1.2)
CO2: 25 mmol/L (ref 20–29)
Calcium: 9.2 mg/dL (ref 8.7–10.2)
Chloride: 101 mmol/L (ref 96–106)
Creatinine, Ser: 0.91 mg/dL (ref 0.57–1.00)
GFR calc Af Amer: 100 mL/min/{1.73_m2} (ref >59)
GFR calc non Af Amer: 87 mL/min/{1.73_m2} (ref >59)
Globulin, Total: 2.8 g/dL (ref 1.5–4.5)
Glucose: 68 mg/dL (ref 65–99)
Potassium: 3.6 mmol/L (ref 3.5–5.2)
Sodium: 140 mmol/L (ref 134–144)
Total Protein: 7.1 g/dL (ref 6.0–8.5)

## 2019-06-07 LAB — HIV ANTIBODY (ROUTINE TESTING W REFLEX): HIV Screen 4th Generation wRfx: NONREACTIVE

## 2019-06-07 LAB — LIPID PANEL
Chol/HDL Ratio: 3.2 ratio (ref 0.0–4.4)
Cholesterol, Total: 157 mg/dL (ref 100–199)
HDL: 49 mg/dL (ref >39)
LDL Calculated: 91 mg/dL (ref 0–99)
Triglycerides: 85 mg/dL (ref 0–149)
VLDL Cholesterol Cal: 17 mg/dL (ref 5–40)

## 2019-06-07 LAB — RPR: RPR Ser Ql: NONREACTIVE

## 2019-06-11 ENCOUNTER — Telehealth: Payer: Self-pay

## 2019-06-11 LAB — CERVICOVAGINAL ANCILLARY ONLY
Bacterial vaginitis: NEGATIVE
Candida vaginitis: NEGATIVE
Chlamydia: NEGATIVE
Neisseria Gonorrhea: NEGATIVE
Trichomonas: NEGATIVE

## 2019-06-11 NOTE — Telephone Encounter (Signed)
-----   Message from Virginia Crews, MD sent at 06/11/2019 12:34 PM EDT ----- Swab negative for BV, yeast, gonorrhea, chlamydia, and trichomonas

## 2019-06-11 NOTE — Telephone Encounter (Signed)
Pt advised.   Thanks,   -Laura  

## 2019-06-11 NOTE — Telephone Encounter (Signed)
Left message for patient to call regarding lab results.  

## 2019-06-18 ENCOUNTER — Telehealth: Payer: Self-pay | Admitting: Family Medicine

## 2019-06-18 NOTE — Telephone Encounter (Signed)
Will forward to nurse pool, who likely called her about results.

## 2019-06-18 NOTE — Telephone Encounter (Signed)
Pt returning missed call.  Please call pt back if needed. ° °Thanks, °TGH °

## 2019-06-18 NOTE — Telephone Encounter (Signed)
Patient advised of her labs results.

## 2019-07-02 ENCOUNTER — Ambulatory Visit: Payer: Self-pay

## 2019-07-07 DIAGNOSIS — Z5181 Encounter for therapeutic drug level monitoring: Secondary | ICD-10-CM | POA: Diagnosis not present

## 2019-07-08 ENCOUNTER — Ambulatory Visit
Admission: EM | Admit: 2019-07-08 | Discharge: 2019-07-08 | Disposition: A | Discharge status: Home / Self Care | Payer: Medicaid Other | Attending: Family Medicine | Admitting: Family Medicine

## 2019-07-08 ENCOUNTER — Other Ambulatory Visit: Payer: Self-pay

## 2019-07-08 ENCOUNTER — Encounter: Payer: Self-pay | Admitting: Emergency Medicine

## 2019-07-08 DIAGNOSIS — Z20822 Contact with and (suspected) exposure to covid-19: Secondary | ICD-10-CM

## 2019-07-08 DIAGNOSIS — B9689 Other specified bacterial agents as the cause of diseases classified elsewhere: Secondary | ICD-10-CM | POA: Insufficient documentation

## 2019-07-08 DIAGNOSIS — N760 Acute vaginitis: Secondary | ICD-10-CM | POA: Insufficient documentation

## 2019-07-08 DIAGNOSIS — Z20828 Contact with and (suspected) exposure to other viral communicable diseases: Secondary | ICD-10-CM | POA: Diagnosis not present

## 2019-07-08 LAB — WET PREP, GENITAL
Sperm: NONE SEEN
Trich, Wet Prep: NONE SEEN

## 2019-07-08 MED ORDER — METRONIDAZOLE 500 MG PO TABS: 500.0000 mg | ORAL_TABLET | Freq: Two times a day (BID) | ORAL | 0 refills | Status: DC

## 2019-07-08 NOTE — ED Provider Notes (Signed)
[place], [place]   Name: Jody Perez DOB: May 31, 1991 MRN: [id number] CSN: [id number] PCP: Virginia Crews, MD  Arrival date and time:  07/08/19 1704  Chief Complaint:  Vaginal Discharge and Vaginal Itching   NOTE: Prior to seeing the patient today, I have reviewed the triage nursing documentation and vital signs. Clinical staff has updated patient's PMH/PSHx, current medication list, and drug allergies/intolerances to ensure comprehensive history available to assist in medical decision making.   History:   HPI: Jody Perez is a 28 y.o. female who presents today with complaints of vaginal discharge and itching that began yesterday. She denies any associated vaginal or pelvic pain. She is not having any bleeding. Discharge is reported to be white in color. She has not appreciated any associated odor. Patient denies concurrent urinary symptoms; no dysuria, frequency, or urgency. Patient has not experienced any nausea, vomiting, fever, and chills. She has not experienced any pain in her lower back, flank area, or abdomen. PMH (+) for recurrent bacterial vaginosis. Patient's last menstrual period was 06/24/2019. There are no concerns that she is currently pregnant. She presents to clinic today accompanied by three small children.   Additionally, patient reporting a recent direct exposure to SARS-CoV-2 (novel coronavirus). Known exposure reported to have occurred "sometime last week". 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 Colleague tested positive on this past Sunday. Patient presents today with no symptoms; no cough, fevers, or other symptoms commonly associated with SARS-CoV-2. She advises that she feels generally well. Patient presents for testing out of concern for her personal health. She adds that she is is being required to provide documentation of negative test results before she will be allowed to return to work.   Past Medical History:  Diagnosis Date  . BV (bacterial vaginosis)   . Chlamydia   . Herpes genitalis in women   . HSV infection   . Migraines   . Miscarriage   . Trichomonas   . UTI (lower urinary tract infection)     Past Surgical History:  Procedure Laterality Date  . NO PAST SURGERIES    . WISDOM TOOTH EXTRACTION      Family History  Problem Relation Age of Onset  . Hypertension Mother   . Hypotension Mother   . Healthy Brother   . Hypertension Brother   . Healthy Father   . Breast cancer Maternal Grandmother   . Healthy Brother   . Colon cancer Neg Hx   . Ovarian cancer Neg Hx   . Cervical cancer Neg Hx     Social History   Tobacco Use  . Smoking status: Never Smoker  . Smokeless tobacco: Never Used  Substance Use Topics  . Alcohol use: No    Comment: socially  . Drug use: No    Patient Active Problem List   Diagnosis Date Noted  . Recurrent vaginitis 06/06/2019  . Overweight 06/06/2019  . Anemia 03/27/2018  . History of genital HSV 01/28/2013    Home Medications:    No outpatient medications have been marked as taking for the 07/08/19 encounter [facility] Encounter).    Allergies:   Patient has no known allergies.  Review of Systems (ROS): Review of Systems  Constitutional: Negative for chills and fever.  HENT: Negative for congestion, ear pain, rhinorrhea, sinus pain and sore throat.   Respiratory: Negative for cough and shortness of breath.   Cardiovascular: Negative for chest  pain and palpitations.  Gastrointestinal: Negative for abdominal pain, diarrhea, nausea and vomiting.  Genitourinary: Positive for vaginal discharge. Negative for decreased urine volume, dyspareunia, dysuria, flank pain, frequency, genital sores, hematuria, menstrual problem, pelvic pain, urgency, vaginal bleeding and vaginal pain.  Musculoskeletal: Negative for back pain.  Neurological: Negative for dizziness, syncope, weakness and headaches.  Hematological: Negative  for adenopathy.  All other systems reviewed and are negative.    Vital Signs: Today's Vitals   07/08/19 1723 07/08/19 1725 07/08/19 1811  BP:  130/84   Pulse:  73   Resp:  18   Temp:  98 F (36.7 C)   TempSrc:  Oral   SpO2:  100%   Weight: 150 lb (68 kg)    Height: 5\' 4"  (1.626 m)    PainSc: 0-No pain  4     Physical Exam: Physical Exam  Constitutional: She is oriented to person, place, and time and well-developed, well-nourished, and in no distress.  HENT:  Head: Normocephalic and atraumatic.  Mouth/Throat: Mucous membranes are normal.  Eyes: Pupils are equal, round, and reactive to light. EOM are normal.  Neck: Normal range of motion. Neck supple. No tracheal deviation present.  Cardiovascular: Normal rate, regular rhythm, normal heart sounds and intact distal pulses. Exam reveals no gallop and no friction rub.  No murmur heard. Pulmonary/Chest: Effort normal and breath sounds normal. No respiratory distress. She has no wheezes. She has no rales.  Abdominal: Soft. Normal appearance and bowel sounds are normal. There is no abdominal tenderness. There is no CVA tenderness.  Genitourinary:    Genitourinary Comments: Exam deferred. No pelvic pain. Patient elected to self swab for wet prep.   Lymphadenopathy:    She has no cervical adenopathy.  Neurological: She is alert and oriented to person, place, and time. Gait normal. GCS score is 15.  Skin: Skin is warm and dry. No rash noted.  Psychiatric: Mood, memory, affect and judgment normal.  Nursing note and vitals reviewed.   Urgent Care Treatments / Results:   LABS: PLEASE NOTE: all labs that were ordered this encounter are listed, however only abnormal results are displayed. Labs Reviewed  WET PREP, GENITAL - Abnormal; Notable for the following components:      Result Value   Yeast Wet Prep HPF POC PRESENT (*)    Clue Cells Wet Prep HPF POC PRESENT (*)    WBC, Wet Prep HPF POC FEW (*)    All other components within  normal limits  NOVEL CORONAVIRUS, NAA (HOSPITAL ORDER, SEND-OUT TO REF LAB)    EKG: -None  RADIOLOGY: No results found.  PROCEDURES: Procedures  MEDICATIONS RECEIVED THIS VISIT: Medications - No data to display  PERTINENT CLINICAL COURSE NOTES/UPDATES:   Initial Impression / Assessment and Plan / Urgent Care Course:  Pertinent labs & imaging results that were available during my care of the patient were personally reviewed by me and considered in my medical decision making (see lab/imaging section of note for values and interpretations).  Minette HeadlandKyata Yager is a 28 y.o. female who [facility] today with complaints of vaginal itching, vaginal discharge, and recent close exposure to SARS-CoV-2.  Patient is well appearing overall in clinic today. She does not appear to be in any acute distress. Presenting symptoms (see HPI) and exam as documented above. She denies abdominal pain, pelvic pain, bleeding, and fevers. No urinary symptoms. Wet prep (+) for clue cells, which is consistent for recurrent BV. Will treat with 7 day course  of metronidazole. Patient encouraged to avoid all alcohol while on this medication due to associated risk of a disulfiram like reaction, which will cause her to experienced nausea and vomiting.   She also is presenting following a close exposure to SARS-CoV-2 (novel coronavirus). Discussed typical symptom constellation. Reviewed potential for infection with recent close contact. Given exposure and potential for infection, testing is reasonable. Patient collected SARS-CoV-2 via facility approved self swab process today under the supervision of certified clinical staff. Discussed variable turn around times associated with testing, as swabs are being processed at [facility], and have been taking as long as 7 days. She was advised to self quarantine, per [facility] DH[facility] guidelines, until negative results received.   Current clinical condition warrants patient being out  of work in order to recover from her current injury/illness. She was provided with the appropriate documentation to provide to her place of employment that will allow for her to RTW on 07/11/2019 with no restrictions. RTW is contingent on her SARS-CoV-2 test results being reviewed as negative.    Discussed follow up with primary care physician should she develop any new or concerning symptoms. I have reviewed the follow up and strict return precautions for any new or worsening symptoms. Patient is aware of symptoms that would be deemed urgent/emergent, and would thus require further evaluation either here or in the emergency department. At the time of discharge, he verbalized understanding and consent with the discharge plan as it was reviewed with him. All questions were fielded by provider and/or clinic staff prior to patient discharge.     Final Clinical Impressions / Urgent Care Diagnoses:   Final diagnoses:  BV (bacterial vaginosis)  Exposure to Covid-19 Virus    New Prescriptions:  [place] Controlled Substance Registry consulted? Not Applicable  Meds ordered this encounter  Medications  . metroNIDAZOLE (FLAGYL) 500 MG tablet    Sig: Take 1 tablet (500 mg total) by mouth 2 (two) times daily.    Dispense:  14 tablet    Refill:  0    Recommended Follow up Care:  Patient encouraged to follow up with the following provider within the specified time frame, or sooner as dictated by the severity of her symptoms. As always, she was instructed that for any urgent/emergent care needs, she should seek care either here or in the emergency department for more immediate evaluation.  Follow-up Information    Bacigalupo, Marzella SchleinAngela M, MD In 1 week.   Specialty: Family Medicine Why: General reassessment of symptoms if not improving Contact information: [address] [place] [address] [phone]         NOTE: This note was prepared using Dragon dictation software along with  smaller phrase technology. Despite my best ability to proofread, there is the potential that transcriptional errors may still occur from this process, and are completely unintentional.    Verlee MonteGray, Victormanuel Mclure E, NP 07/09/19 0128

## 2019-07-08 NOTE — Discharge Instructions (Signed)
It was very nice seeing you today in clinic. Thank you for entrusting me with your care.   Test POSITIVE for BV today. Please utilize the medications that we discussed. Your prescriptions have been called in to your pharmacy. Avoid alcohol while on this medication as it will make you very ill.   You were tested today for SARS-CoV-2 (novel coronavirus). Result have been taking 3-5 days to result. Please self quarantine, per [place] DHHS, guidelines until negative results have been received.   Make arrangements to follow up with your regular doctor in 1 week for re-evaluation if not improving. If your symptoms/condition worsens, please seek follow up care either here or in the ER. Please remember, our [place] providers are "right here with you" when you need [place].   Again, it was my pleasure to take care of you today. Thank you for choosing our clinic. I hope that you start to feel better quickly.   Honor Loh, MSN, APRN, FNP-C, CEN Advanced Practice Provider [facility]

## 2019-07-08 NOTE — ED Triage Notes (Signed)
Patient c/o vaginal discharge and itching that started last night. Patient denies pain. Patient denies N/V.

## 2019-07-10 LAB — NOVEL CORONAVIRUS, NAA (HOSP ORDER, SEND-OUT TO REF LAB; TAT 18-24 HRS): SARS-CoV-2, NAA: NOT DETECTED

## 2019-07-14 ENCOUNTER — Encounter: Payer: Self-pay | Admitting: Physician Assistant

## 2019-07-14 ENCOUNTER — Ambulatory Visit: Payer: Medicaid Other | Admitting: Physician Assistant

## 2019-07-14 ENCOUNTER — Other Ambulatory Visit: Payer: Self-pay

## 2019-07-14 DIAGNOSIS — B373 Candidiasis of vulva and vagina: Secondary | ICD-10-CM

## 2019-07-14 DIAGNOSIS — Z0389 Encounter for observation for other suspected diseases and conditions ruled out: Secondary | ICD-10-CM | POA: Diagnosis not present

## 2019-07-14 DIAGNOSIS — B3731 Acute candidiasis of vulva and vagina: Secondary | ICD-10-CM

## 2019-07-14 DIAGNOSIS — Z113 Encounter for screening for infections with a predominantly sexual mode of transmission: Secondary | ICD-10-CM

## 2019-07-14 DIAGNOSIS — Z3009 Encounter for other general counseling and advice on contraception: Secondary | ICD-10-CM | POA: Diagnosis not present

## 2019-07-14 DIAGNOSIS — Z1388 Encounter for screening for disorder due to exposure to contaminants: Secondary | ICD-10-CM | POA: Diagnosis not present

## 2019-07-14 LAB — WET PREP FOR TRICH, YEAST, CLUE: Trichomonas Exam: NEGATIVE

## 2019-07-14 MED ORDER — CLOTRIMAZOLE 1 % VA CREA: 1.0000 | TOPICAL_CREAM | Freq: Every day | VAGINAL | 0 refills | Status: AC

## 2019-07-14 MED ORDER — CLOTRIMAZOLE 1 % VA CREA: 1.0000 | TOPICAL_CREAM | Freq: Every day | VAGINAL | 0 refills | Status: DC

## 2019-07-14 NOTE — Progress Notes (Signed)
Here today for STD screening. Declines bloodwork. Separate appt scheduled for 07/16/2019 @ 10:20 for patient to restart Depo. Hal Morales, RN Wet Prep results reviewed patient treated for yeast per standing orders. Hal Morales, RN

## 2019-07-14 NOTE — Progress Notes (Signed)
    STI clinic/screening visit  Subjective:  Jody Perez is a 28 y.o. female being seen today for an STI screening visit. The patient reports they do have symptoms.  Patient has the following medical conditions:   Patient Active Problem List   Diagnosis Date Noted  . Recurrent vaginitis 06/06/2019  . Overweight 06/06/2019  . Anemia 03/27/2018  . History of genital HSV 01/28/2013     Chief Complaint  Patient presents with  . SEXUALLY TRANSMITTED DISEASE    HPI  Patient reports that she has been having vaginal irritation for about 3 days.  Denies other symptoms.  See flowsheet for further details and programmatic requirements.    The following portions of the patient's history were reviewed and updated as appropriate: allergies, current medications, past medical history, past social history, past surgical history and problem list.  Objective:  There were no vitals filed for this visit.  Physical Exam Constitutional:      General: She is not in acute distress.    Appearance: Normal appearance.  HENT:     Head: Normocephalic and atraumatic.     Mouth/Throat:     Mouth: Mucous membranes are moist.     Pharynx: Oropharynx is clear. No oropharyngeal exudate or posterior oropharyngeal erythema.  Neck:     Musculoskeletal: Neck supple.  Pulmonary:     Effort: Pulmonary effort is normal.  Abdominal:     Palpations: Abdomen is soft.     Tenderness: There is no abdominal tenderness. There is no guarding or rebound.     Hernia: No hernia is present.  Genitourinary:    General: Normal vulva.     Rectum: Normal.     Comments: External genitalia/pubic area without nits, lice, edema, erythema, lesions and inguinal adenopathy. Vagina with normal mucosa, large amount of thick, yellowish/whitish clumpy discharge, pH=4.5. Cervix without visible lesions. Uterus normal size, firm, mobile, no CMT, no masses, no adnexal tenderness or fullness.  Lymphadenopathy:     Cervical: No  cervical adenopathy.  Skin:    General: Skin is warm and dry.     Findings: No bruising, erythema, lesion or rash.     Comments: tattoos  Neurological:     Mental Status: She is alert and oriented to person, place, and time.  Psychiatric:        Mood and Affect: Mood normal.        Behavior: Behavior normal.        Thought Content: Thought content normal.        Judgment: Judgment normal.       Assessment and Plan:  Jody Perez is a 28 y.o. female presenting to the [facility] Department for STI screening  1. Screening for STD (sexually transmitted disease) Patient declines blood work today. Rec condoms with all sex Await test results.  Counseled that RN will call if needs to RTC for treatment once results are back.  - WET PREP FOR [place], YEAST, [facility]  2. Yeast infection of the vagina Treat with Clotrimazole 1% vaginal cream 1 app qhs for 7 days No sex for 7 days - clotrimazole (CLOTRIMAZOLE-7) 1 % vaginal cream; Place 1 Applicatorful vaginally at bedtime.  Dispense: 45 g; Refill: 0     No follow-ups on file.  Future Appointments  Date Time Provider [place]  07/16/2019 10:20 AM [facility] PROVIDER [facility] None  06/09/2020 10:00 AM Bacigalupo, Dionne Bucy, MD [facility] None    Jerene Dilling, [place]

## 2019-07-14 NOTE — Addendum Note (Signed)
Addended by: Hal Morales A on: 07/14/2019 06:05 PM   Modules accepted: Orders

## 2019-07-16 ENCOUNTER — Ambulatory Visit: Payer: Medicaid Other

## 2019-07-16 ENCOUNTER — Ambulatory Visit (LOCAL_COMMUNITY_HEALTH_CENTER): Payer: Medicaid Other | Admitting: Advanced Practice Midwife

## 2019-07-16 ENCOUNTER — Encounter: Payer: Self-pay | Admitting: Nurse Practitioner

## 2019-07-16 ENCOUNTER — Other Ambulatory Visit: Payer: Self-pay

## 2019-07-16 VITALS — BP 107/68 | Ht 63.25 in | Wt 161.2 lb

## 2019-07-16 DIAGNOSIS — Z3009 Encounter for other general counseling and advice on contraception: Secondary | ICD-10-CM

## 2019-07-16 DIAGNOSIS — Z3042 Encounter for surveillance of injectable contraceptive: Secondary | ICD-10-CM

## 2019-07-16 DIAGNOSIS — Z30013 Encounter for initial prescription of injectable contraceptive: Secondary | ICD-10-CM

## 2019-07-16 DIAGNOSIS — Z5181 Encounter for therapeutic drug level monitoring: Secondary | ICD-10-CM | POA: Diagnosis not present

## 2019-07-16 LAB — PREGNANCY, URINE: Preg Test, Ur: NEGATIVE

## 2019-07-16 MED ORDER — MEDROXYPROGESTERONE ACETATE 150 MG/ML IM SUSP
150.0000 mg | Freq: Once | INTRAMUSCULAR | Status: AC
  Administered 2019-07-16: 12:00:00 150 mg via INTRAMUSCULAR

## 2019-07-16 MED ORDER — MEDROXYPROGESTERONE ACETATE 150 MG/ML IM SUSP: 150.0000 mg | INTRAMUSCULAR | 4 refills | Status: DC

## 2019-07-16 NOTE — Progress Notes (Signed)
    [place] problem visit  [place] Department  Subjective:  Jody Perez is a 28 y.o. being seen today for   Chief Complaint  Patient presents with  . Contraception    HPI G4P3 nonsmoker here to restart DMPA (last DMPA 04/2018). Pt states last physical 06/06/2019 with primary care [facility] at [facility]  LMP 06/19/19.  Last coitus 07/11/19 without condom.    Does the patient have a current or past history of drug use? No   No components found for: HCV]   Health Maintenance Due  Topic Date Due  . INFLUENZA VACCINE  06/28/2019    ROS  The following portions of the patient's history were reviewed and updated as appropriate: allergies, current medications, past family history, past medical history, past social history, past surgical history and problem list. Problem list updated.   See flowsheet for other program required questions.  Objective:   Vitals:   07/16/19 1119  BP: 107/68  Weight: 161 lb 3.2 oz (73.1 kg)  Height: 5' 3.25" (1.607 m)    Physical Exam  n/a  Assessment and Plan:  Beckham Buxbaum is a 28 y.o. female presenting to the [facility] Department for a Women's Health problem visit  1. Family planning Desires to restart DMPA with last DMPA 04/2018 and no current birth control  2. Encounter for surveillance of injectable contraceptive  - Pregnancy, urine If PT neg today may have DMPA 150 mg IM q11-13 wks x 1 year. Please counsel on need for backup condoms first week     No follow-ups on file.  Future Appointments  Date Time Provider [place]  06/09/2020 10:00 AM Bacigalupo, Dionne Bucy, MD [facility] None    Herbie Saxon, CNM

## 2019-07-16 NOTE — Progress Notes (Signed)
In for visit; desires to restart Depo Debera Lat, RN

## 2019-07-23 DIAGNOSIS — Z5181 Encounter for therapeutic drug level monitoring: Secondary | ICD-10-CM | POA: Diagnosis not present

## 2019-07-29 DIAGNOSIS — Z5181 Encounter for therapeutic drug level monitoring: Secondary | ICD-10-CM | POA: Diagnosis not present

## 2019-08-06 DIAGNOSIS — Z5181 Encounter for therapeutic drug level monitoring: Secondary | ICD-10-CM | POA: Diagnosis not present

## 2019-08-13 DIAGNOSIS — Z5181 Encounter for therapeutic drug level monitoring: Secondary | ICD-10-CM | POA: Diagnosis not present

## 2019-08-15 ENCOUNTER — Ambulatory Visit: Payer: Self-pay | Admitting: Family Medicine

## 2019-08-15 NOTE — Progress Notes (Deleted)
       Patient: Jody Perez Female    DOB: 1991/08/07   28 y.o.   MRN: [id number] Visit Date: 08/15/2019  Today's Provider: Lavon Paganini, MD   No chief complaint on file.  Subjective:    Leg Pain      No Known Allergies   Current Outpatient Medications:  .  metroNIDAZOLE (FLAGYL) 500 MG tablet, Take 1 tablet (500 mg total) by mouth 2 (two) times daily., Disp: 14 tablet, Rfl: 0  Review of Systems  Constitutional: Negative.   Respiratory: Negative.   Genitourinary: Negative.   Neurological: Negative.     Social History   Tobacco Use  . Smoking status: Never Smoker  . Smokeless tobacco: Never Used  Substance Use Topics  . Alcohol use: No    Comment: socially      Objective:   There were no vitals taken for this visit. There were no vitals filed for this visit.There is no height or weight on file to calculate BMI.   Physical Exam   No results found for any visits on 08/15/19.     Assessment & Plan        Lavon Paganini, MD  [facility]

## 2019-08-19 DIAGNOSIS — Z5181 Encounter for therapeutic drug level monitoring: Secondary | ICD-10-CM | POA: Diagnosis not present

## 2019-08-26 DIAGNOSIS — Z5181 Encounter for therapeutic drug level monitoring: Secondary | ICD-10-CM | POA: Diagnosis not present

## 2019-08-31 ENCOUNTER — Other Ambulatory Visit: Payer: Self-pay

## 2019-08-31 ENCOUNTER — Encounter: Payer: Self-pay | Admitting: Emergency Medicine

## 2019-08-31 ENCOUNTER — Emergency Department
Admission: EM | Admit: 2019-08-31 | Discharge: 2019-08-31 | Disposition: A | Discharge status: Home / Self Care | Payer: Medicaid Other | Attending: Student | Admitting: Student

## 2019-08-31 ENCOUNTER — Emergency Department: Payer: Medicaid Other

## 2019-08-31 DIAGNOSIS — Y9389 Activity, other specified: Secondary | ICD-10-CM | POA: Insufficient documentation

## 2019-08-31 DIAGNOSIS — S161XXA Strain of muscle, fascia and tendon at neck level, initial encounter: Secondary | ICD-10-CM | POA: Diagnosis not present

## 2019-08-31 DIAGNOSIS — S6991XA Unspecified injury of right wrist, hand and finger(s), initial encounter: Secondary | ICD-10-CM | POA: Diagnosis not present

## 2019-08-31 DIAGNOSIS — G44319 Acute post-traumatic headache, not intractable: Secondary | ICD-10-CM | POA: Diagnosis not present

## 2019-08-31 DIAGNOSIS — M25511 Pain in right shoulder: Secondary | ICD-10-CM | POA: Diagnosis not present

## 2019-08-31 DIAGNOSIS — M25531 Pain in right wrist: Secondary | ICD-10-CM | POA: Diagnosis not present

## 2019-08-31 DIAGNOSIS — S0990XA Unspecified injury of head, initial encounter: Secondary | ICD-10-CM | POA: Diagnosis not present

## 2019-08-31 DIAGNOSIS — Y92410 Unspecified street and highway as the place of occurrence of the external cause: Secondary | ICD-10-CM | POA: Insufficient documentation

## 2019-08-31 DIAGNOSIS — S4991XA Unspecified injury of right shoulder and upper arm, initial encounter: Secondary | ICD-10-CM | POA: Diagnosis not present

## 2019-08-31 DIAGNOSIS — S60211A Contusion of right wrist, initial encounter: Secondary | ICD-10-CM | POA: Diagnosis not present

## 2019-08-31 DIAGNOSIS — Y999 Unspecified external cause status: Secondary | ICD-10-CM | POA: Insufficient documentation

## 2019-08-31 DIAGNOSIS — S199XXA Unspecified injury of neck, initial encounter: Secondary | ICD-10-CM | POA: Diagnosis not present
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
## 2019-08-31 MED ORDER — MELOXICAM 15 MG PO TABS: 15.0000 mg | ORAL_TABLET | Freq: Every day | ORAL | 0 refills | Status: DC

## 2019-08-31 MED ORDER — MELOXICAM 7.5 MG PO TABS
15.0000 mg | ORAL_TABLET | Freq: Once | ORAL | Status: AC
  Administered 2019-08-31: 15 mg via ORAL
  Filled 2019-08-31: qty 2

## 2019-08-31 MED ORDER — METHOCARBAMOL 500 MG PO TABS: 500.0000 mg | ORAL_TABLET | Freq: Four times a day (QID) | ORAL | 0 refills | Status: DC
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
## 2019-09-02 ENCOUNTER — Encounter: Payer: Self-pay | Admitting: Physician Assistant

## 2019-09-02 ENCOUNTER — Ambulatory Visit: Payer: Medicaid Other | Admitting: Physician Assistant

## 2019-09-02 ENCOUNTER — Other Ambulatory Visit: Payer: Self-pay

## 2019-09-02 DIAGNOSIS — Z113 Encounter for screening for infections with a predominantly sexual mode of transmission: Secondary | ICD-10-CM

## 2019-09-02 DIAGNOSIS — Z5181 Encounter for therapeutic drug level monitoring: Secondary | ICD-10-CM | POA: Diagnosis not present

## 2019-09-02 DIAGNOSIS — Z0389 Encounter for observation for other suspected diseases and conditions ruled out: Secondary | ICD-10-CM | POA: Diagnosis not present

## 2019-09-02 DIAGNOSIS — Z1388 Encounter for screening for disorder due to exposure to contaminants: Secondary | ICD-10-CM | POA: Diagnosis not present

## 2019-09-02 DIAGNOSIS — Z3009 Encounter for other general counseling and advice on contraception: Secondary | ICD-10-CM | POA: Diagnosis not present

## 2019-09-02 LAB — WET PREP FOR TRICH, YEAST, CLUE
Trichomonas Exam: NEGATIVE
Yeast Exam: NEGATIVE

## 2019-09-02 NOTE — Progress Notes (Signed)
    STI clinic/screening visit  Subjective:  Jody Perez is a 28 y.o. female being seen today for an STI screening visit. The patient reports they do have symptoms.  Patient has the following medical conditions:   Patient Active Problem List   Diagnosis Date Noted  . Recurrent vaginitis 06/06/2019  . Overweight 06/06/2019  . Anemia 03/27/2018  . History of genital HSV 01/28/2013     Chief Complaint  Patient presents with  . SEXUALLY TRANSMITTED DISEASE    HPI  Patient reports that she wants to make sure that her discharge is normal.  Reports white discharge for 2 days.  Uses Depo as BCM.  See flowsheet for further details and programmatic requirements.    The following portions of the patient's history were reviewed and updated as appropriate: allergies, current medications, past medical history, past social history, past surgical history and problem list.  Objective:  There were no vitals filed for this visit.  Physical Exam Constitutional:      General: She is not in acute distress.    Appearance: Normal appearance.  HENT:     Head: Normocephalic and atraumatic.     Mouth/Throat:     Mouth: Mucous membranes are moist.     Pharynx: Oropharynx is clear. No oropharyngeal exudate or posterior oropharyngeal erythema.  Eyes:     Conjunctiva/sclera: Conjunctivae normal.  Neck:     Musculoskeletal: Neck supple.  Pulmonary:     Effort: Pulmonary effort is normal.  Abdominal:     Palpations: Abdomen is soft. There is no mass.     Tenderness: There is no abdominal tenderness. There is no guarding or rebound.  Genitourinary:    General: Normal vulva.     Rectum: Normal.     Comments: External genitalia/pubic area without nits, lice, edema, erythema, lesions and inguinal adenopathy. Vagina with normal mucosa and discharge, pH=4.5. Cervix without visible lesions. Uterus firm, mobile, nt, no CMT, no masses, no adnexal tenderness or fullness. Lymphadenopathy:   Cervical: No cervical adenopathy.  Skin:    General: Skin is warm and dry.     Findings: No bruising, erythema, lesion or rash.  Neurological:     Mental Status: She is alert and oriented to person, place, and time.  Psychiatric:        Mood and Affect: Mood normal.        Behavior: Behavior normal.        Thought Content: Thought content normal.        Judgment: Judgment normal.       Assessment and Plan:  Jody Perez is a 28 y.o. female presenting to the [facility] Department for STI screening  1. Screening for STD (sexually transmitted disease) Counseled that exam and wet mount are normal. Rec condoms with all sex. Await test results.  Counseled that RN will call if needs to RTC for further treatment once results are back. - WET PREP FOR [place], YEAST, [facility]     No follow-ups on file.  Future Appointments  Date Time Provider [place]  06/09/2020 10:00 AM Bacigalupo, Dionne Bucy, MD [facility] None    Jerene Dilling, [place]

## 2019-09-07 ENCOUNTER — Encounter (HOSPITAL_COMMUNITY): Payer: Self-pay

## 2019-09-07 ENCOUNTER — Other Ambulatory Visit: Payer: Self-pay

## 2019-09-07 ENCOUNTER — Ambulatory Visit (HOSPITAL_COMMUNITY)
Admission: EM | Admit: 2019-09-07 | Discharge: 2019-09-07 | Disposition: A | Discharge status: Home / Self Care | Payer: Medicaid Other | Attending: Family Medicine | Admitting: Family Medicine

## 2019-09-07 DIAGNOSIS — N760 Acute vaginitis: Secondary | ICD-10-CM | POA: Diagnosis not present

## 2019-09-07 DIAGNOSIS — B9689 Other specified bacterial agents as the cause of diseases classified elsewhere: Secondary | ICD-10-CM

## 2019-09-07 MED ORDER — FLUCONAZOLE 150 MG PO TABS: 150.0000 mg | ORAL_TABLET | Freq: Every day | ORAL | 0 refills | Status: DC

## 2019-09-07 MED ORDER — METRONIDAZOLE 500 MG PO TABS: 500.0000 mg | ORAL_TABLET | Freq: Two times a day (BID) | ORAL | 0 refills | Status: DC

## 2019-09-07 NOTE — Discharge Instructions (Addendum)
Take metronidazole 2 times a day with food This is for the bacterial  vaginosis Take Diflucan today and then again in 7 days when you finish your metronidazole.  This prevents yeast infection Call or return for problems

## 2019-09-07 NOTE — ED Triage Notes (Signed)
Patient presents to Urgent Care with complaints of odorous vaginal discharge since yesterday. Patient reports she got her discharged assessed at another clinic and told her she was okay but then it became odorous yesterday and she thinks she has BV, denies unprotected intercourse.

## 2019-09-07 NOTE — ED Provider Notes (Signed)
[facility]    CSN: [id number] Arrival date & time: 09/07/19  1229      History   Chief Complaint Chief Complaint  Patient presents with  . Vaginal Discharge    HPI Jody Perez is a 28 y.o. female.   HPI  Patient was seen at the [facility] department couple days ago for vaginal infection.  She states she has vaginal odor and discomfort.  Her wet prep was negative.  Her GC test is pending.  She was not treated.  She states she continues to have irritation and a Strain odor.  She thinks she has BV.  She is requesting testing.  Past Medical History:  Diagnosis Date  . BV (bacterial vaginosis)   . Chlamydia   . Herpes genitalis in women   . HSV infection   . Migraines   . Miscarriage   . Trichomonas   . UTI (lower urinary tract infection)     Patient Active Problem List   Diagnosis Date Noted  . Recurrent vaginitis 06/06/2019  . Overweight 06/06/2019  . Anemia 03/27/2018  . History of genital HSV 01/28/2013    Past Surgical History:  Procedure Laterality Date  . NO PAST SURGERIES    . WISDOM TOOTH EXTRACTION      OB History    Gravida  4   Para  3   Term  3   Preterm  0   AB  1   Living  3     SAB  1   TAB  0   Ectopic  0   Multiple  0   Live Births  3            Home Medications    Prior to Admission medications   Medication Sig Start Date End Date Taking? Authorizing Provider  fluconazole (DIFLUCAN) 150 MG tablet Take 1 tablet (150 mg total) by mouth daily. Repeat in 1 week if needed 09/07/19   Eustace Moore, MD  meloxicam (MOBIC) 15 MG tablet Take 1 tablet (15 mg total) by mouth daily. 08/31/19   Cuthriell, Delorise Royals, PA-C  methocarbamol (ROBAXIN) 500 MG tablet Take 1 tablet (500 mg total) by mouth 4 (four) times daily. 08/31/19   Cuthriell, Delorise Royals, PA-C  metroNIDAZOLE (FLAGYL) 500 MG tablet Take 1 tablet (500 mg total) by mouth 2 (two) times daily. 09/07/19   Eustace Moore, MD    Family History Family  History  Problem Relation Age of Onset  . Hypertension Mother   . Hypotension Mother   . Healthy Brother   . Hypertension Brother   . Healthy Father   . Breast cancer Maternal Grandmother   . Healthy Brother   . Colon cancer Neg Hx   . Ovarian cancer Neg Hx   . Cervical cancer Neg Hx     Social History Social History   Tobacco Use  . Smoking status: Never Smoker  . Smokeless tobacco: Never Used  Substance Use Topics  . Alcohol use: Yes    Comment: socially  . Drug use: No     Allergies   Patient has no known allergies.   Review of Systems Review of Systems  Constitutional: Negative for chills and fever.  HENT: Negative for ear pain and sore throat.   Eyes: Negative for pain and visual disturbance.  Respiratory: Negative for cough and shortness of breath.   Cardiovascular: Negative for chest pain and palpitations.  Gastrointestinal: Negative for abdominal pain and vomiting.  Genitourinary: Positive  for vaginal discharge. Negative for dysuria and hematuria.  Musculoskeletal: Negative for arthralgias and back pain.  Skin: Negative for color change and rash.  Neurological: Negative for seizures and syncope.  All other systems reviewed and are negative.    Physical Exam Triage Vital Signs ED Triage Vitals  Enc Vitals Group     BP 09/07/19 1247 118/70     Pulse Rate 09/07/19 1247 89     Resp 09/07/19 1247 15     Temp 09/07/19 1247 98.7 F (37.1 C)     Temp Source 09/07/19 1247 Oral     SpO2 09/07/19 1247 100 %     Weight --      Height --      Head Circumference --      Peak Flow --      Pain Score 09/07/19 1330 0     Pain Loc --      Pain Edu? --      Excl. in GC? --    No data found.  Updated Vital Signs BP 118/70 (BP Location: Right Arm)   Pulse 89   Temp 98.7 F (37.1 C) (Oral)   Resp 15   SpO2 100%      Physical Exam Constitutional:      General: She is not in acute distress.    Appearance: She is well-developed.  HENT:     Head:  Normocephalic and atraumatic.  Eyes:     Conjunctiva/sclera: Conjunctivae normal.     Pupils: Pupils are equal, round, and reactive to light.  Neck:     Musculoskeletal: Normal range of motion.  Cardiovascular:     Rate and Rhythm: Normal rate.  Pulmonary:     Effort: Pulmonary effort is normal. No respiratory distress.  Abdominal:     General: There is no distension.     Palpations: Abdomen is soft.  Genitourinary:    Comments: Exam not indicated Musculoskeletal: Normal range of motion.  Skin:    General: Skin is warm and dry.  Neurological:     Mental Status: She is alert.      UC Treatments / Results  Labs (all labs ordered are listed, but only abnormal results are displayed) Labs Reviewed - No data to display  EKG   Radiology No results found.  Procedures Procedures (including critical care time)  Medications Ordered in UC Medications - No data to display  Initial Impression / Assessment and Plan / UC Course  I have reviewed the triage vital signs and the nursing notes.  Pertinent labs & imaging results that were available during my care of the patient were reviewed by me and considered in my medical decision making (see chart for details).     Patient had a pelvic exam 2 or 3 days ago.  It was negative.  She is also had STD testing.  Those results are pending.  I will treat her for her vaginosis. Final Clinical Impressions(s) / UC Diagnoses   Final diagnoses:  BV (bacterial vaginosis)     Discharge Instructions     Take metronidazole 2 times a day with food This is for the bacterial  vaginosis Take Diflucan today and then again in 7 days when you finish your metronidazole.  This prevents yeast infection Call or return for problems   ED Prescriptions    Medication Sig Dispense Auth. Provider   metroNIDAZOLE (FLAGYL) 500 MG tablet Take 1 tablet (500 mg total) by mouth 2 (two) times daily. 14 tablet Delton SeeNelson,  Jennette Banker, MD   fluconazole (DIFLUCAN)  150 MG tablet Take 1 tablet (150 mg total) by mouth daily. Repeat in 1 week if needed 2 tablet Raylene Everts, MD     PDMP not reviewed this encounter.   Raylene Everts, MD 09/07/19 1409

## 2019-09-09 DIAGNOSIS — Z5181 Encounter for therapeutic drug level monitoring: Secondary | ICD-10-CM | POA: Diagnosis not present

## 2019-09-10 ENCOUNTER — Telehealth: Payer: Self-pay

## 2019-09-10 NOTE — Telephone Encounter (Signed)
TC to patient. Verified ID via password/SS#. Informed of positive chlamydia and need for tx. Instructed to eat before visit and have partner call for tx appt. Appt scheduled.Mylo Driskill, RN    

## 2019-09-11 ENCOUNTER — Other Ambulatory Visit: Payer: Self-pay

## 2019-09-11 ENCOUNTER — Ambulatory Visit: Payer: Self-pay

## 2019-09-11 DIAGNOSIS — A749 Chlamydial infection, unspecified: Secondary | ICD-10-CM

## 2019-09-11 MED ORDER — AZITHROMYCIN 500 MG PO TABS
1000.0000 mg | ORAL_TABLET | Freq: Once | ORAL | Status: AC
  Administered 2019-09-11: 1000 mg via ORAL

## 2019-09-11 NOTE — Progress Notes (Signed)
Tx'd for Chlamydia per SO Jenita Rayfield, RN  

## 2019-09-16 DIAGNOSIS — Z5181 Encounter for therapeutic drug level monitoring: Secondary | ICD-10-CM | POA: Diagnosis not present

## 2019-09-22 DIAGNOSIS — Z5181 Encounter for therapeutic drug level monitoring: Secondary | ICD-10-CM | POA: Diagnosis not present

## 2019-09-29 DIAGNOSIS — Z5181 Encounter for therapeutic drug level monitoring: Secondary | ICD-10-CM | POA: Diagnosis not present

## 2019-10-06 DIAGNOSIS — Z5181 Encounter for therapeutic drug level monitoring: Secondary | ICD-10-CM | POA: Diagnosis not present

## 2019-10-13 DIAGNOSIS — Z5181 Encounter for therapeutic drug level monitoring: Secondary | ICD-10-CM | POA: Diagnosis not present

## 2019-10-18 ENCOUNTER — Encounter (HOSPITAL_COMMUNITY): Payer: Self-pay

## 2019-10-18 ENCOUNTER — Ambulatory Visit (HOSPITAL_COMMUNITY)
Admission: EM | Admit: 2019-10-18 | Discharge: 2019-10-18 | Disposition: A | Discharge status: Home / Self Care | Payer: Medicaid Other | Attending: Physician Assistant | Admitting: Physician Assistant

## 2019-10-18 ENCOUNTER — Other Ambulatory Visit: Payer: Self-pay

## 2019-10-18 DIAGNOSIS — B9689 Other specified bacterial agents as the cause of diseases classified elsewhere: Secondary | ICD-10-CM

## 2019-10-18 DIAGNOSIS — N760 Acute vaginitis: Secondary | ICD-10-CM | POA: Diagnosis not present

## 2019-10-18 MED ORDER — FLUCONAZOLE 150 MG PO TABS: 150.0000 mg | ORAL_TABLET | Freq: Every day | ORAL | 0 refills | Status: DC

## 2019-10-18 MED ORDER — METRONIDAZOLE 500 MG PO TABS: 500.0000 mg | ORAL_TABLET | Freq: Two times a day (BID) | ORAL | 0 refills | Status: DC

## 2019-10-18 NOTE — Discharge Instructions (Signed)
Return if any problems.

## 2019-10-18 NOTE — ED Provider Notes (Signed)
[facility]    CSN: [id number] Arrival date & time: 10/18/19  1357      History   Chief Complaint Chief Complaint  Patient presents with  . Vaginal Discharge    HPI Jody Perez is a 28 y.o. female.   The history is provided by the patient. No language interpreter was used.  Vaginal Discharge Quality:  Thick Severity:  Moderate Onset quality:  Gradual Timing:  Constant Progression:  Worsening Chronicity:  New Relieved by:  Nothing Worsened by:  Nothing Associated symptoms: vaginal itching    Pt thinks she has BV.  Past Medical History:  Diagnosis Date  . BV (bacterial vaginosis)   . Chlamydia   . Herpes genitalis in women   . HSV infection   . Migraines   . Miscarriage   . Trichomonas   . UTI (lower urinary tract infection)     Patient Active Problem List   Diagnosis Date Noted  . Recurrent vaginitis 06/06/2019  . Overweight 06/06/2019  . Anemia 03/27/2018  . History of genital HSV 01/28/2013    Past Surgical History:  Procedure Laterality Date  . NO PAST SURGERIES    . WISDOM TOOTH EXTRACTION      OB History    Gravida  4   Para  3   Term  3   Preterm  0   AB  1   Living  3     SAB  1   TAB  0   Ectopic  0   Multiple  0   Live Births  3            Home Medications    Prior to Admission medications   Medication Sig Start Date End Date Taking? Authorizing Provider  fluconazole (DIFLUCAN) 150 MG tablet Take 1 tablet (150 mg total) by mouth daily. Repeat in 1 week if needed 10/18/19   Elson Areas, PA-C  meloxicam (MOBIC) 15 MG tablet Take 1 tablet (15 mg total) by mouth daily. 08/31/19   Cuthriell, Delorise Royals, PA-C  methocarbamol (ROBAXIN) 500 MG tablet Take 1 tablet (500 mg total) by mouth 4 (four) times daily. 08/31/19   Cuthriell, Delorise Royals, PA-C  metroNIDAZOLE (FLAGYL) 500 MG tablet Take 1 tablet (500 mg total) by mouth 2 (two) times daily. 10/18/19   Elson Areas, PA-C    Family History Family  History  Problem Relation Age of Onset  . Hypertension Mother   . Hypotension Mother   . Healthy Brother   . Hypertension Brother   . Healthy Father   . Breast cancer Maternal Grandmother   . Healthy Brother   . Colon cancer Neg Hx   . Ovarian cancer Neg Hx   . Cervical cancer Neg Hx     Social History Social History   Tobacco Use  . Smoking status: Never Smoker  . Smokeless tobacco: Never Used  Substance Use Topics  . Alcohol use: Yes    Comment: socially  . Drug use: No     Allergies   Patient has no known allergies.   Review of Systems Review of Systems  Genitourinary: Positive for vaginal discharge.  All other systems reviewed and are negative.    Physical Exam Triage Vital Signs ED Triage Vitals  Enc Vitals Group     BP 10/18/19 1420 121/81     Pulse --      Resp 10/18/19 1420 18     Temp 10/18/19 1420 98.8 F (37.1 C)  Temp Source 10/18/19 1420 Oral     SpO2 10/18/19 1420 100 %     Weight 10/18/19 1425 160 lb (72.6 kg)     Height --      Head Circumference --      Peak Flow --      Pain Score --      Pain Loc --      Pain Edu? --      Excl. in [place]? --    No data found.  Updated Vital Signs BP 121/81 (BP Location: Right Arm)   Temp 98.8 F (37.1 C) (Oral)   Resp 18   Wt 72.6 kg   SpO2 100%   BMI 27.46 kg/m   Visual Acuity Right Eye Distance:   Left Eye Distance:   Bilateral Distance:    Right Eye Near:   Left Eye Near:    Bilateral Near:     Physical Exam Constitutional:      Appearance: She is well-developed.  HENT:     Head: Normocephalic and atraumatic.  Eyes:     Conjunctiva/sclera: Conjunctivae normal.     Pupils: Pupils are equal, round, and reactive to light.  Neck:     Musculoskeletal: Normal range of motion and neck supple.  Cardiovascular:     Rate and Rhythm: Normal rate.  Pulmonary:     Effort: Pulmonary effort is normal.  Abdominal:     Palpations: Abdomen is soft.     Tenderness: There is no abdominal  tenderness.  Genitourinary:    Comments: Vaginal discharge,  Thick white,  Adnexa no masses,  Cervix nontender Musculoskeletal: Normal range of motion.      UC Treatments / Results  Labs (all labs ordered are listed, but only abnormal results are displayed) Labs Reviewed  CERVICOVAGINAL ANCILLARY ONLY    EKG   Radiology No results found.  Procedures Procedures (including critical care time)  Medications Ordered in UC Medications - No data to display  Initial Impression / Assessment and Plan / UC Course  I have reviewed the triage vital signs and the nursing notes.  Pertinent labs & imaging results that were available during my care of the patient were reviewed by me and considered in my medical decision making (see chart for details).     MDM  Pt did home swab.  Pt given rx for flagyl and diflucan  Final Clinical Impressions(s) / UC Diagnoses   Final diagnoses:  BV (bacterial vaginosis)     Discharge Instructions     Return if any problems.    ED Prescriptions    Medication Sig Dispense Auth. Provider   fluconazole (DIFLUCAN) 150 MG tablet Take 1 tablet (150 mg total) by mouth daily. Repeat in 1 week if needed 2 tablet Jule Whitsel K, PA-C   metroNIDAZOLE (FLAGYL) 500 MG tablet Take 1 tablet (500 mg total) by mouth 2 (two) times daily. 14 tablet Fransico Meadow, [place]     PDMP not reviewed this encounter.  An After Visit Summary was printed and given to the patient.    Fransico Meadow, [place] 10/18/19 1456

## 2019-10-18 NOTE — ED Triage Notes (Signed)
Pt states she has vaginal discharge. X 3 days. Pt states she has some vaginal irritation as well.

## 2019-10-21 LAB — CERVICOVAGINAL ANCILLARY ONLY
Bacterial vaginitis: NEGATIVE
Candida vaginitis: NEGATIVE
Chlamydia: NEGATIVE
Neisseria Gonorrhea: NEGATIVE
Trichomonas: NEGATIVE

## 2019-10-27 DIAGNOSIS — Z5181 Encounter for therapeutic drug level monitoring: Secondary | ICD-10-CM | POA: Diagnosis not present

## 2019-10-29 ENCOUNTER — Ambulatory Visit (LOCAL_COMMUNITY_HEALTH_CENTER): Payer: Medicaid Other

## 2019-10-29 ENCOUNTER — Other Ambulatory Visit: Payer: Self-pay

## 2019-10-29 VITALS — BP 122/77 | Ht 63.25 in | Wt 165.5 lb

## 2019-10-29 DIAGNOSIS — Z3009 Encounter for other general counseling and advice on contraception: Secondary | ICD-10-CM

## 2019-10-29 DIAGNOSIS — Z30013 Encounter for initial prescription of injectable contraceptive: Secondary | ICD-10-CM | POA: Diagnosis not present

## 2019-10-29 MED ORDER — MEDROXYPROGESTERONE ACETATE 150 MG/ML IM SUSP
150.0000 mg | Freq: Once | INTRAMUSCULAR | Status: AC
  Administered 2019-10-29: 150 mg via INTRAMUSCULAR

## 2019-10-29 MED ORDER — MULTI-VITAMIN/MINERALS PO TABS: 1.0000 | ORAL_TABLET | Freq: Every day | ORAL | 0 refills | Status: DC

## 2019-10-29 NOTE — Progress Notes (Signed)
Folic acid counseling completed and MVI dispensed. Depo administered per 07/16/2019 written order of E. Sciora CNM and client tolerated without complaint. Rich Number, RN

## 2019-11-03 DIAGNOSIS — Z5181 Encounter for therapeutic drug level monitoring: Secondary | ICD-10-CM | POA: Diagnosis not present

## 2019-11-10 DIAGNOSIS — Z5181 Encounter for therapeutic drug level monitoring: Secondary | ICD-10-CM | POA: Diagnosis not present

## 2019-11-24 DIAGNOSIS — Z5181 Encounter for therapeutic drug level monitoring: Secondary | ICD-10-CM | POA: Diagnosis not present

## 2019-12-30 ENCOUNTER — Other Ambulatory Visit: Payer: Self-pay

## 2019-12-30 ENCOUNTER — Ambulatory Visit: Payer: Medicaid Other | Admitting: Physician Assistant

## 2019-12-30 DIAGNOSIS — Z0389 Encounter for observation for other suspected diseases and conditions ruled out: Secondary | ICD-10-CM | POA: Diagnosis not present

## 2019-12-30 DIAGNOSIS — Z113 Encounter for screening for infections with a predominantly sexual mode of transmission: Secondary | ICD-10-CM | POA: Diagnosis not present

## 2019-12-30 DIAGNOSIS — Z1388 Encounter for screening for disorder due to exposure to contaminants: Secondary | ICD-10-CM | POA: Diagnosis not present

## 2019-12-30 DIAGNOSIS — Z3009 Encounter for other general counseling and advice on contraception: Secondary | ICD-10-CM | POA: Diagnosis not present

## 2019-12-30 LAB — WET PREP FOR TRICH, YEAST, CLUE
Trichomonas Exam: NEGATIVE
Yeast Exam: NEGATIVE

## 2020-01-01 ENCOUNTER — Encounter: Payer: Self-pay | Admitting: Physician Assistant

## 2020-01-01 ENCOUNTER — Other Ambulatory Visit: Payer: Self-pay

## 2020-01-01 NOTE — Progress Notes (Signed)
[facility] Department STI clinic/screening visit  Subjective:  Jody Perez is a 29 y.o. female being seen today for an STI screening visit. The patient reports they do have symptoms.  Patient reports that they do not desire a pregnancy in the next year.   They reported they are not interested in discussing contraception today.  No LMP recorded. Patient has had an injection.   Patient has the following medical conditions:   Patient Active Problem List   Diagnosis Date Noted  . Recurrent vaginitis 06/06/2019  . Overweight 06/06/2019  . Anemia 03/27/2018  . History of genital HSV 01/28/2013    Chief Complaint  Patient presents with  . SEXUALLY TRANSMITTED DISEASE    HPI  Patient reports that she has noticed a "musty" odor for 2 weeks.  Denies other symptoms.  Reports that she has had some spotting with her Depo recently.  Last depo was given 10/29/2019.  See flowsheet for further details and programmatic requirements.    The following portions of the patient's history were reviewed and updated as appropriate: allergies, current medications, past medical history, past social history, past surgical history and problem list.  Objective:  There were no vitals filed for this visit.  Physical Exam Constitutional:      General: She is not in acute distress.    Appearance: Normal appearance. She is normal weight.  HENT:     Head: Normocephalic and atraumatic.     Comments: No nits, lice, or hair loss. No cervical, supraclavicular or axillary adenopathy.    Mouth/Throat:     Mouth: Mucous membranes are moist.     Pharynx: Oropharynx is clear. No oropharyngeal exudate or posterior oropharyngeal erythema.  Eyes:     Conjunctiva/sclera: Conjunctivae normal.  Pulmonary:     Effort: Pulmonary effort is normal.  Abdominal:     Palpations: Abdomen is soft. There is no mass.     Tenderness: There is no abdominal tenderness. There is no guarding or rebound.   Genitourinary:    General: Normal vulva.     Rectum: Normal.     Comments: External genitalia/pubic area without nits, lice, edema, erythema, lesions, and inguinal adenopathy. Vagina with normal mucosa, small amount of dark, brown menstrual blood present. Cervix without visible lesions.  Uterus firm, mobile, nt, no masses, no CMT, no adnexal tenderness or fullness. Musculoskeletal:     Cervical back: Neck supple. No tenderness.  Skin:    General: Skin is warm and dry.     Findings: No bruising, erythema, lesion or rash.  Neurological:     Mental Status: She is alert and oriented to person, place, and time.  Psychiatric:        Mood and Affect: Mood normal.        Behavior: Behavior normal.        Thought Content: Thought content normal.        Judgment: Judgment normal.      Assessment and Plan:  Jody Perez is a 29 y.o. female presenting to the [facility] Department for STI screening  1. Screening for STD (sexually transmitted disease) Patient into clinic with symptoms. Rec condoms with all sex. Await test results.  Counseled that RN will call if needs to RTC for treatment once results are back. Reviewed with patient that wet mount is negative and odor may be due to bleeding. - Gonococcus culture - Chlamydia/Gonorrhea [place] [facility] - HIV [place] [facility] - Syphilis Serology, [place] [facility] - Gonococcus culture -  WET PREP FOR TRICH, YEAST, CLUE     No follow-ups on file.  Future Appointments  Date Time Provider [place]  01/01/2020  2:45 PM [facility] NURSE [facility] None  01/14/2020 11:00 AM [facility] PROVIDER AC-FAM None  06/09/2020 10:00 AM Bacigalupo, Dionne Bucy, MD [facility] [place], [place]

## 2020-01-04 LAB — GONOCOCCUS CULTURE

## 2020-01-05 NOTE — Addendum Note (Signed)
Addended by: Richmond Campbell on: 01/05/2020 10:57 AM   Modules accepted: Orders

## 2020-01-05 NOTE — Progress Notes (Signed)
Due to lab error specimens not sent to state lab.  Patient contacted and scheduled for lab draw but [facility]. Orders cancelled from this visit and 01/01/20 appt. Richmond Campbell, RN

## 2020-01-05 NOTE — Progress Notes (Signed)
No showed for visit. Richmond Campbell, RN

## 2020-01-14 ENCOUNTER — Ambulatory Visit: Payer: Medicaid Other

## 2020-03-02 ENCOUNTER — Other Ambulatory Visit: Payer: Self-pay | Admitting: Family Medicine

## 2020-03-02 DIAGNOSIS — Z8619 Personal history of other infectious and parasitic diseases: Secondary | ICD-10-CM

## 2020-03-02 MED ORDER — VALACYCLOVIR HCL 1 G PO TABS: 1000.0000 mg | ORAL_TABLET | Freq: Every day | ORAL | 2 refills | Status: DC

## 2020-03-02 NOTE — Telephone Encounter (Signed)
Called and informed patient of prescription being sent to her pharmacy. She gave verbal understanding.

## 2020-03-02 NOTE — Telephone Encounter (Signed)
Patient is checking status of medication refill. Patient call back [phone]

## 2020-03-02 NOTE — Telephone Encounter (Signed)
Valtrex sent.

## 2020-03-02 NOTE — Telephone Encounter (Signed)
Please review for Dr. B ° ° °Thanks,  ° °-Keagen Heinlen  °

## 2020-03-02 NOTE — Telephone Encounter (Signed)
[facility] faxed refill request for the following medications:  [address] [place]  [address]  [phone] - phone [phone] - fax   valACYclovir (VALTREX) 1000 MG tablet [[id number]] DISCONTINUED    Please advise.  Thanks, [employer]

## 2020-03-03 ENCOUNTER — Encounter: Payer: Self-pay | Admitting: Emergency Medicine

## 2020-03-03 ENCOUNTER — Ambulatory Visit
Admission: EM | Admit: 2020-03-03 | Discharge: 2020-03-03 | Disposition: A | Discharge status: Home / Self Care | Payer: Medicaid Other | Attending: Family Medicine | Admitting: Family Medicine

## 2020-03-03 ENCOUNTER — Other Ambulatory Visit: Payer: Self-pay

## 2020-03-03 DIAGNOSIS — N3001 Acute cystitis with hematuria: Secondary | ICD-10-CM

## 2020-03-03 LAB — URINALYSIS, COMPLETE (UACMP) WITH MICROSCOPIC
Bilirubin Urine: NEGATIVE
Glucose, UA: NEGATIVE mg/dL
Ketones, ur: NEGATIVE mg/dL
Nitrite: NEGATIVE
Protein, ur: 100 mg/dL — AB
Specific Gravity, Urine: 1.025 (ref 1.005–1.030)
WBC, UA: >50 WBC/hpf (ref 0–5)
pH: 7 (ref 5.0–8.0)

## 2020-03-03 MED ORDER — CEPHALEXIN 500 MG PO CAPS: 500.0000 mg | ORAL_CAPSULE | Freq: Two times a day (BID) | ORAL | 0 refills | Status: DC

## 2020-03-03 NOTE — ED Provider Notes (Signed)
[facility]    CSN: [id number] Arrival date & time: 03/03/20  8416      History   Chief Complaint Chief Complaint  Patient presents with  . Dysuria  . Urinary Frequency   HPI  29 year old female presents with dysuria and urinary frequency.  Symptoms started abruptly this morning.  She reports urinary frequency and dysuria.  Reports lower abdominal pain.  No fever.  No flank pain.  No medications or interventions tried.  No other associated symptoms.  No other complaints.  Past Medical History:  Diagnosis Date  . BV (bacterial vaginosis)   . Chlamydia   . Herpes genitalis in women   . HSV infection   . Migraines   . Miscarriage   . Trichomonas   . UTI (lower urinary tract infection)     Patient Active Problem List   Diagnosis Date Noted  . Recurrent vaginitis 06/06/2019  . Overweight 06/06/2019  . Anemia 03/27/2018  . History of genital HSV 01/28/2013    Past Surgical History:  Procedure Laterality Date  . NO PAST SURGERIES    . WISDOM TOOTH EXTRACTION      OB History    Gravida  4   Para  3   Term  3   Preterm  0   AB  1   Living  3     SAB  1   TAB  0   Ectopic  0   Multiple  0   Live Births  3            Home Medications    Prior to Admission medications   Medication Sig Start Date End Date Taking? Authorizing Provider  cephALEXin (KEFLEX) 500 MG capsule Take 1 capsule (500 mg total) by mouth 2 (two) times daily. 03/03/20   Coral Spikes, DO  Multiple Vitamins-Minerals (MULTIVITAMIN WITH MINERALS) tablet Take 1 tablet by mouth daily. 10/29/19   Sciora, Real Cons, CNM  valACYclovir (VALTREX) 1000 MG tablet Take 1 tablet (1,000 mg total) by mouth daily. 03/02/20   Trinna Post, PA-C    Family History Family History  Problem Relation Age of Onset  . Hypertension Mother   . Hypotension Mother   . Healthy Brother   . Hypertension Brother   . Healthy Father   . Breast cancer Maternal Grandmother   . Healthy  Brother   . Colon cancer Neg Hx   . Ovarian cancer Neg Hx   . Cervical cancer Neg Hx     Social History Social History   Tobacco Use  . Smoking status: Never Smoker  . Smokeless tobacco: Never Used  Substance Use Topics  . Alcohol use: Yes    Comment: socially  . Drug use: No     Allergies   Patient has no known allergies.   Review of Systems Review of Systems  Constitutional: Negative for fever.  Gastrointestinal: Positive for abdominal pain.  Genitourinary: Positive for dysuria and frequency.   Physical Exam Triage Vital Signs ED Triage Vitals  Enc Vitals Group     BP 03/03/20 0929 125/89     Pulse Rate 03/03/20 0929 85     Resp 03/03/20 0929 18     Temp 03/03/20 0929 98.3 F (36.8 C)     Temp Source 03/03/20 0929 Oral     SpO2 03/03/20 0929 100 %     Weight 03/03/20 0928 160 lb (72.6 kg)     Height 03/03/20 0928 5\' 4"  (1.626 m)  Head Circumference --      Peak Flow --      Pain Score 03/03/20 0928 10     Pain Loc --      Pain Edu? --      Excl. in [place]? --    Updated Vital Signs BP 125/89 (BP Location: Right Arm)   Pulse 85   Temp 98.3 F (36.8 C) (Oral)   Resp 18   Ht 5\' 4"  (1.626 m)   Wt 72.6 kg   LMP 03/02/2020   SpO2 100%   BMI 27.46 kg/m   Visual Acuity Right Eye Distance:   Left Eye Distance:   Bilateral Distance:    Right Eye Near:   Left Eye Near:    Bilateral Near:     Physical Exam Constitutional:      General: She is not in acute distress.    Appearance: Normal appearance. She is not ill-appearing.  HENT:     Head: Normocephalic and atraumatic.  Eyes:     General:        Right eye: No discharge.        Left eye: No discharge.     Conjunctiva/sclera: Conjunctivae normal.  Cardiovascular:     Rate and Rhythm: Normal rate and regular rhythm.     Heart sounds: No murmur.  Pulmonary:     Effort: Pulmonary effort is normal.  Abdominal:     General: There is no distension.     Palpations: Abdomen is soft.     Comments:  Mild tenderness in the suprapubic region.  Neurological:     Mental Status: She is alert.  Psychiatric:        Behavior: Behavior normal.     Comments: Flat affect.    UC Treatments / Results  Labs (all labs ordered are listed, but only abnormal results are displayed) Labs Reviewed  URINALYSIS, COMPLETE (UACMP) WITH MICROSCOPIC - Abnormal; Notable for the following components:      Result Value   APPearance CLOUDY (*)    Hgb urine dipstick MODERATE (*)    Protein, ur 100 (*)    Leukocytes,Ua SMALL (*)    Bacteria, UA MANY (*)    All other components within normal limits  URINE CULTURE    EKG   Radiology No results found.  Procedures Procedures (including critical care time)  Medications Ordered in UC Medications - No data to display  Initial Impression / Assessment and Plan / UC Course  I have reviewed the triage vital signs and the nursing notes.  Pertinent labs & imaging results that were available during my care of the patient were reviewed by me and considered in my medical decision making (see chart for details).    29 year old female presents with UTI.  Sending culture.  Placing on Keflex.  Final Clinical Impressions(s) / UC Diagnoses   Final diagnoses:  Acute cystitis with hematuria     Discharge Instructions     Antibiotic as prescribed.  Take care  Dr. 26    ED Prescriptions    Medication Sig Dispense Auth. Provider   cephALEXin (KEFLEX) 500 MG capsule Take 1 capsule (500 mg total) by mouth 2 (two) times daily. 14 capsule Adriana Simas G, DO     PDMP not reviewed this encounter.   Everlene Other, DO 03/03/20 1020

## 2020-03-03 NOTE — ED Triage Notes (Signed)
Patient c/o urinary frequency and dysuria that started this morning.

## 2020-03-03 NOTE — Discharge Instructions (Signed)
Antibiotic as prescribed.  Take care  Dr. Zarya Lasseigne  

## 2020-03-05 LAB — URINE CULTURE: Culture: >=100000 — AB

## 2020-03-12 ENCOUNTER — Other Ambulatory Visit: Payer: Self-pay

## 2020-03-12 ENCOUNTER — Ambulatory Visit: Payer: Medicaid Other | Admitting: Family Medicine

## 2020-03-12 ENCOUNTER — Encounter: Payer: Self-pay | Admitting: Family Medicine

## 2020-03-12 DIAGNOSIS — Z30012 Encounter for prescription of emergency contraception: Secondary | ICD-10-CM

## 2020-03-12 DIAGNOSIS — Z113 Encounter for screening for infections with a predominantly sexual mode of transmission: Secondary | ICD-10-CM

## 2020-03-12 DIAGNOSIS — Z3009 Encounter for other general counseling and advice on contraception: Secondary | ICD-10-CM | POA: Diagnosis not present

## 2020-03-12 DIAGNOSIS — Z1388 Encounter for screening for disorder due to exposure to contaminants: Secondary | ICD-10-CM | POA: Diagnosis not present

## 2020-03-12 DIAGNOSIS — Z0389 Encounter for observation for other suspected diseases and conditions ruled out: Secondary | ICD-10-CM | POA: Diagnosis not present

## 2020-03-12 DIAGNOSIS — Z789 Other specified health status: Secondary | ICD-10-CM

## 2020-03-12 LAB — WET PREP FOR TRICH, YEAST, CLUE
Trichomonas Exam: NEGATIVE
Yeast Exam: NEGATIVE

## 2020-03-12 MED ORDER — LEVONORGESTREL 1.5 MG PO TABS: 1.5000 mg | ORAL_TABLET | Freq: Once | ORAL | 0 refills | Status: AC

## 2020-03-12 NOTE — Progress Notes (Signed)
Patient here for STD testing.Jody Salomone Brewer-Jensen, RN 

## 2020-03-12 NOTE — Progress Notes (Signed)
Wet mount reviewed, and shown to provider as requested. No treatment indicated. Patient signed consent for ECP, and will pick up at pharmacy.Burt Knack, RN

## 2020-03-12 NOTE — Progress Notes (Signed)
[facility] Department STI clinic/screening visit  Subjective:  Jody Perez is a 29 y.o. female being seen today for  Chief Complaint  Patient presents with  . Exposure to STD     The patient reports they do have symptoms. Patient reports that they do not desire a pregnancy in the next year. They reported they are not interested in discussing contraception today.   Patient has the following medical conditions:   Patient Active Problem List   Diagnosis Date Noted  . Recurrent vaginitis 06/06/2019  . Overweight 06/06/2019  . Anemia 03/27/2018  . History of genital HSV 01/28/2013    HPI  Pt reports vaginal irriation and some swelling x3-4 days, would like STI testing.   See flowsheet for further details and programmatic requirements.    Patient's last menstrual period was 03/05/2020 (approximate). Last sex: yesterday BCM: none Desires EC? yes   No components found for: HCV  The following portions of the patient's history were reviewed and updated as appropriate: allergies, current medications, past medical history, past social history, past surgical history and problem list.  Objective:  There were no vitals filed for this visit.   Physical Exam Vitals and nursing note reviewed.  Constitutional:      Appearance: Normal appearance.  HENT:     Head: Normocephalic and atraumatic.     Mouth/Throat:     Mouth: Mucous membranes are moist.     Pharynx: Oropharynx is clear. No oropharyngeal exudate or posterior oropharyngeal erythema.  Pulmonary:     Effort: Pulmonary effort is normal.  Abdominal:     General: Abdomen is flat.     Palpations: There is no mass.     Tenderness: There is no abdominal tenderness. There is no rebound.  Genitourinary:    General: Normal vulva.     Exam position: Lithotomy position.     Pubic Area: No rash or pubic lice.      Labia:        Right: No rash or lesion.        Left: No rash or lesion.      Vagina: Vaginal  discharge (white, moderate amt, ph>4.5) present. No erythema, bleeding or lesions.     Cervix: No cervical motion tenderness, discharge, friability, lesion or erythema.     Uterus: Normal.      Adnexa: Right adnexa normal and left adnexa normal.     Rectum: Normal.  Lymphadenopathy:     Head:     Right side of head: No preauricular or posterior auricular adenopathy.     Left side of head: No preauricular or posterior auricular adenopathy.     Cervical: No cervical adenopathy.     Upper Body:     Right upper body: No supraclavicular or axillary adenopathy.     Left upper body: No supraclavicular or axillary adenopathy.     Lower Body: No right inguinal adenopathy. No left inguinal adenopathy.  Skin:    General: Skin is warm and dry.     Findings: No rash.  Neurological:     Mental Status: She is alert and oriented to person, place, and time.      Assessment and Plan:  Jody Perez is a 29 y.o. female presenting to the [facility] Department for STI screening   1. Screening examination for venereal disease -Pt with symptoms. Screenings today as below. Treat wet prep per standing order. -Patient does not meet criteria for HepB, HepC Screening. Declines HIV and syphilis screenings. -  Counseled on warning s/sx and when to seek care. Recommended condom use with all sex and discussed importance of condom use for STI prevention. - WET PREP FOR TRICH, YEAST, CLUE - Chlamydia/Gonorrhea [place] [facility]  2. Emergency contraception -Patient was offered ECP. ECP was accepted by the patient. ECP counseling was given - see RN documentation. - levonorgestrel (PLAN B 1-STEP) 1.5 MG tablet; Take 1 tablet (1.5 mg total) by mouth once for 1 dose.  Dispense: 1 tablet; Refill: 0     Return if symptoms worsen or fail to improve.  Future Appointments  Date Time Provider Department Center  06/09/2020 10:00 AM Bacigalupo, Marzella Schlein, MD [facility]    Ann Held, PA-C

## 2020-03-23 DIAGNOSIS — R300 Dysuria: Secondary | ICD-10-CM | POA: Diagnosis not present

## 2020-03-29 ENCOUNTER — Ambulatory Visit
Admission: EM | Admit: 2020-03-29 | Discharge: 2020-03-29 | Disposition: A | Discharge status: Home / Self Care | Payer: Medicaid Other | Attending: Family Medicine | Admitting: Family Medicine

## 2020-03-29 ENCOUNTER — Other Ambulatory Visit: Payer: Self-pay

## 2020-03-29 DIAGNOSIS — N760 Acute vaginitis: Secondary | ICD-10-CM | POA: Diagnosis not present

## 2020-03-29 DIAGNOSIS — B9689 Other specified bacterial agents as the cause of diseases classified elsewhere: Secondary | ICD-10-CM

## 2020-03-29 DIAGNOSIS — N3000 Acute cystitis without hematuria: Secondary | ICD-10-CM

## 2020-03-29 DIAGNOSIS — B373 Candidiasis of vulva and vagina: Secondary | ICD-10-CM

## 2020-03-29 DIAGNOSIS — B3731 Acute candidiasis of vulva and vagina: Secondary | ICD-10-CM

## 2020-03-29 LAB — URINALYSIS, COMPLETE (UACMP) WITH MICROSCOPIC
Bilirubin Urine: NEGATIVE
Glucose, UA: NEGATIVE mg/dL
Ketones, ur: NEGATIVE mg/dL
Nitrite: NEGATIVE
Protein, ur: 100 mg/dL — AB
Specific Gravity, Urine: 1.02 (ref 1.005–1.030)
WBC, UA: >50 WBC/hpf (ref 0–5)
pH: 7 (ref 5.0–8.0)

## 2020-03-29 LAB — CHLAMYDIA/NGC RT PCR (ARMC ONLY)
Chlamydia Tr: NOT DETECTED
N gonorrhoeae: NOT DETECTED

## 2020-03-29 LAB — PREGNANCY, URINE: Preg Test, Ur: NEGATIVE

## 2020-03-29 LAB — WET PREP, GENITAL
Sperm: NONE SEEN
Trich, Wet Prep: NONE SEEN

## 2020-03-29 MED ORDER — METRONIDAZOLE 500 MG PO TABS: 500.0000 mg | ORAL_TABLET | Freq: Two times a day (BID) | ORAL | 0 refills | Status: DC

## 2020-03-29 MED ORDER — SULFAMETHOXAZOLE-TRIMETHOPRIM 800-160 MG PO TABS: 1.0000 | ORAL_TABLET | Freq: Two times a day (BID) | ORAL | 0 refills | Status: AC

## 2020-03-29 MED ORDER — FLUCONAZOLE 150 MG PO TABS: 150.0000 mg | ORAL_TABLET | Freq: Every day | ORAL | 1 refills | Status: DC

## 2020-03-29 NOTE — ED Provider Notes (Signed)
[facility]    CSN: [id number] Arrival date & time: 03/29/20  0904      History   Chief Complaint Chief Complaint  Patient presents with  . Urinary Tract Infection    HPI Jody Perez is a 29 y.o. female.   Patient is a 29 year old female who presents with complaint of UTI symptoms that started a couple days ago and bacterial vaginosis symptoms began last night.  Patient was seen here with complaint of UTI symptoms and was treated with a course of Keflex.  Culture grew out E. coli UTI which appeared to be sensitive to the Keflex.  Patient does have a history of bacterial vaginosis, chlamydia HSV infection as well.  [facility] for her symptoms.  At that time her urinalysis was negative.  She is advised to use over-the-counter medications for pain increase her water intake.  Patient states the bacterial vaginosis irritation started last night.  She reports some lower abdominal pain with urination.  She states the antibiotics helped and seemed to have resolved her symptoms, stating she did complete the entire course.  Denies any allergies.  Patient denies any nausea.  She denies any fever.  Patient reports no other symptoms.     Past Medical History:  Diagnosis Date  . BV (bacterial vaginosis)   . Chlamydia   . Herpes genitalis in women   . HSV infection   . Migraines   . Miscarriage   . Trichomonas   . UTI (lower urinary tract infection)     Patient Active Problem List   Diagnosis Date Noted  . Recurrent vaginitis 06/06/2019  . Overweight 06/06/2019  . Anemia 03/27/2018  . History of genital HSV 01/28/2013    Past Surgical History:  Procedure Laterality Date  . NO PAST SURGERIES    . WISDOM TOOTH EXTRACTION      OB History    Gravida  4   Para  3   Term  3   Preterm  0   AB  1   Living  3     SAB  1   TAB  0   Ectopic  0   Multiple  0   Live Births  3            Home Medications    Prior to  Admission medications   Medication Sig Start Date End Date Taking? Authorizing Provider  fluconazole (DIFLUCAN) 150 MG tablet Take 1 tablet (150 mg total) by mouth daily. 1 tablet once by mouth.  May repeat in 7 days if needed. 03/29/20   Luvenia Redden, PA-C  metroNIDAZOLE (FLAGYL) 500 MG tablet Take 1 tablet (500 mg total) by mouth 2 (two) times daily. 03/29/20   Luvenia Redden, PA-C  sulfamethoxazole-trimethoprim (BACTRIM DS) 800-160 MG tablet Take 1 tablet by mouth 2 (two) times daily for 10 days. 03/29/20 04/08/20  Luvenia Redden, PA-C    Family History Family History  Problem Relation Age of Onset  . Hypertension Mother   . Hypotension Mother   . Healthy Brother   . Hypertension Brother   . Healthy Father   . Breast cancer Maternal Grandmother   . Healthy Brother   . Colon cancer Neg Hx   . Ovarian cancer Neg Hx   . Cervical cancer Neg Hx     Social History Social History   Tobacco Use  . Smoking status: Never Smoker  . Smokeless tobacco: Never  Used  Substance Use Topics  . Alcohol use: Not Currently    Comment: socially  . Drug use: No     Allergies   Patient has no known allergies.   Review of Systems Review of Systems as noted above in HPI.  Other system reviewed and found to be negative.   Physical Exam Triage Vital Signs ED Triage Vitals  Enc Vitals Group     BP 03/29/20 0922 120/74     Pulse Rate 03/29/20 0922 91     Resp 03/29/20 0922 18     Temp 03/29/20 0922 98.4 F (36.9 C)     Temp Source 03/29/20 0922 Oral     SpO2 03/29/20 0922 99 %     Weight --      Height --      Head Circumference --      Peak Flow --      Pain Score 03/29/20 0919 0     Pain Loc --      Pain Edu? --      Excl. in GC? --    No data found.  Updated Vital Signs BP 120/74 (BP Location: Right Arm)   Pulse 91   Temp 98.4 F (36.9 C) (Oral)   Resp 18   LMP 03/05/2020 (Approximate)   SpO2 99%      Physical Exam Constitutional:      Appearance: Normal  appearance. She is normal weight.  Cardiovascular:     Rate and Rhythm: Normal rate.  Pulmonary:     Effort: Pulmonary effort is normal.  Abdominal:     General: Abdomen is flat.     Palpations: Abdomen is soft.     Tenderness: There is abdominal tenderness (mild tenderness to palpation to lower epigastric/upper pelvic area). There is no right CVA tenderness or left CVA tenderness.  Neurological:     General: No focal deficit present.     Mental Status: She is alert and oriented to person, place, and time.      UC Treatments / Results  Labs (all labs ordered are listed, but only abnormal results are displayed) Labs Reviewed  WET PREP, GENITAL - Abnormal; Notable for the following components:      Result Value   Yeast Wet Prep HPF POC PRESENT (*)    Clue Cells Wet Prep HPF POC PRESENT (*)    WBC, Wet Prep HPF POC FEW (*)    All other components within normal limits  URINALYSIS, COMPLETE (UACMP) WITH MICROSCOPIC - Abnormal; Notable for the following components:   APPearance CLOUDY (*)    Hgb urine dipstick MODERATE (*)    Protein, ur 100 (*)    Leukocytes,Ua LARGE (*)    Bacteria, UA FEW (*)    All other components within normal limits  CHLAMYDIA/NGC RT PCR ([facility] ONLY)  URINE CULTURE  PREGNANCY, URINE    EKG   Radiology No results found.  Procedures Procedures (including critical care time)  Medications Ordered in UC Medications - No data to display  Initial Impression / Assessment and Plan / UC Course  I have reviewed the triage vital signs and the nursing notes.  Pertinent labs & imaging results that were available during my care of the patient were reviewed by me and considered in my medical decision making (see chart for details).  Clinical Course as of Mar 29 1030  Mon Mar 29, 2020  1011 SpO2: 99 % [MH]    Clinical Course User Index [MH] Sherrlyn Hock  D, PA-C    Patient with complaint of UTI symptoms started couple days ago as well as  bacterial vaginosis symptoms that began last night as irritation.  [facility] but urinalysis at that time was negative.  Patient was seen here on April 7 with last diagnosis of a E. coli UTI that was treated with Keflex.  Patient reports her infection at that time had resolve of symptoms with the Keflex.  She states she did complete her full course.  Urinalysis is consistent with a UTI, culture has been ordered.  Wet prep shows clue cells as well as yeast.  We will give patient prescription for Flagyl for the bacterial vaginosis.  Give prescription for Diflucan for her vaginal candidiasis.  Will also give her a prescription for Bactrim for her UTI with cystitis.  Her gonorrhea and Chlamydia are pending.  Final Clinical Impressions(s) / UC Diagnoses   Final diagnoses:  BV (bacterial vaginosis)  Vaginal candidiasis  Acute cystitis without hematuria     Discharge Instructions     -Fluconazole: 1 tablet by mouth once.  May repeat in 7 days as needed -Flagyl: 1 tablet twice a day for 7 days -Bactrim: 1 tablet twice a day for 10 days for UTI. -Will be notified of test results as they return -Increase fluid intake -Ibuprofen or Tylenol as needed for pain -Follow-up with primary care provider as needed.    ED Prescriptions    Medication Sig Dispense Auth. Provider   fluconazole (DIFLUCAN) 150 MG tablet Take 1 tablet (150 mg total) by mouth daily. 1 tablet once by mouth.  May repeat in 7 days if needed. 1 tablet Candis Schatz, PA-C   metroNIDAZOLE (FLAGYL) 500 MG tablet Take 1 tablet (500 mg total) by mouth 2 (two) times daily. 14 tablet Candis Schatz, PA-C   sulfamethoxazole-trimethoprim (BACTRIM DS) 800-160 MG tablet Take 1 tablet by mouth 2 (two) times daily for 10 days. 20 tablet Candis Schatz, PA-C     PDMP not reviewed this encounter.   Candis Schatz, PA-C 03/29/20 1032

## 2020-03-29 NOTE — Discharge Instructions (Signed)
-  Fluconazole: 1 tablet by mouth once.  May repeat in 7 days as needed -Flagyl: 1 tablet twice a day for 7 days -Bactrim: 1 tablet twice a day for 10 days for UTI. -Will be notified of test results as they return -Increase fluid intake -Ibuprofen or Tylenol as needed for pain -Follow-up with primary care provider as needed.

## 2020-03-29 NOTE — ED Triage Notes (Signed)
Pt presents with urinary frequency and burning with urination worsening x 3 days.  Pt reports vaginal itching and some discharge x 1 days.  Reports mild lower abdominal pain during urination.  Pt reports blood on tissue after wiping.    Pt feels like it is UTI or yeast infection but is open to STD testing.  No known exposure to STDs.

## 2020-03-31 LAB — URINE CULTURE: Culture: 20000 — AB

## 2020-04-07 ENCOUNTER — Encounter: Payer: Self-pay | Admitting: Advanced Practice Midwife

## 2020-04-07 ENCOUNTER — Other Ambulatory Visit: Payer: Self-pay

## 2020-04-07 ENCOUNTER — Ambulatory Visit (LOCAL_COMMUNITY_HEALTH_CENTER): Payer: Medicaid Other | Admitting: Advanced Practice Midwife

## 2020-04-07 VITALS — BP 111/69 | Ht 65.0 in | Wt 181.2 lb

## 2020-04-07 DIAGNOSIS — Z3490 Encounter for supervision of normal pregnancy, unspecified, unspecified trimester: Secondary | ICD-10-CM

## 2020-04-07 DIAGNOSIS — Z3009 Encounter for other general counseling and advice on contraception: Secondary | ICD-10-CM

## 2020-04-07 DIAGNOSIS — E669 Obesity, unspecified: Secondary | ICD-10-CM

## 2020-04-07 DIAGNOSIS — Z3042 Encounter for surveillance of injectable contraceptive: Secondary | ICD-10-CM

## 2020-04-07 LAB — PREGNANCY, URINE: Preg Test, Ur: POSITIVE — AB

## 2020-04-07 MED ORDER — PRENATAL VITAMIN 27-0.8 MG PO TABS: 1.0000 | ORAL_TABLET | Freq: Every day | ORAL | 0 refills | Status: DC

## 2020-04-07 NOTE — Progress Notes (Signed)
Patient here for Depo at 23 weeks since last Depo. Last PE 01/2018. All FP and Depo consents signed and scanned (07/17/2019). Per consult with provider, patient sent for PT. PT positive. Patient given PNV and pregnancy packet. Also given proof of pregnancy form. Patient states she plans maternity care with provider in [place] where she has gone for prenatal care with her other pregnancies. Patient states she needs to get to an interview soon. Patient counseled that she can have maternity care here if desired. Patient states understanding.Burt Knack, RN

## 2020-04-09 ENCOUNTER — Inpatient Hospital Stay (HOSPITAL_COMMUNITY): Payer: Medicaid Other

## 2020-04-09 ENCOUNTER — Encounter: Payer: Self-pay | Admitting: Student

## 2020-04-09 ENCOUNTER — Inpatient Hospital Stay (HOSPITAL_COMMUNITY)
Admission: EM | Admit: 2020-04-09 | Discharge: 2020-04-09 | Disposition: A | Discharge status: Home / Self Care | Payer: Medicaid Other | Attending: Family Medicine | Admitting: Family Medicine

## 2020-04-09 DIAGNOSIS — R1031 Right lower quadrant pain: Secondary | ICD-10-CM | POA: Diagnosis present

## 2020-04-09 DIAGNOSIS — Z87440 Personal history of urinary (tract) infections: Secondary | ICD-10-CM | POA: Insufficient documentation

## 2020-04-09 DIAGNOSIS — Z8249 Family history of ischemic heart disease and other diseases of the circulatory system: Secondary | ICD-10-CM | POA: Insufficient documentation

## 2020-04-09 DIAGNOSIS — N83201 Unspecified ovarian cyst, right side: Secondary | ICD-10-CM | POA: Diagnosis not present

## 2020-04-09 DIAGNOSIS — Z3A00 Weeks of gestation of pregnancy not specified: Secondary | ICD-10-CM

## 2020-04-09 DIAGNOSIS — O3481 Maternal care for other abnormalities of pelvic organs, first trimester: Secondary | ICD-10-CM | POA: Diagnosis not present

## 2020-04-09 DIAGNOSIS — Z3A01 Less than 8 weeks gestation of pregnancy: Secondary | ICD-10-CM | POA: Diagnosis not present

## 2020-04-09 DIAGNOSIS — O26891 Other specified pregnancy related conditions, first trimester: Secondary | ICD-10-CM | POA: Diagnosis not present

## 2020-04-09 DIAGNOSIS — M545 Low back pain: Secondary | ICD-10-CM | POA: Diagnosis not present

## 2020-04-09 DIAGNOSIS — O99891 Other specified diseases and conditions complicating pregnancy: Secondary | ICD-10-CM | POA: Diagnosis not present

## 2020-04-09 DIAGNOSIS — R109 Unspecified abdominal pain: Secondary | ICD-10-CM

## 2020-04-09 DIAGNOSIS — O26899 Other specified pregnancy related conditions, unspecified trimester: Secondary | ICD-10-CM

## 2020-04-09 DIAGNOSIS — O3680X0 Pregnancy with inconclusive fetal viability, not applicable or unspecified: Secondary | ICD-10-CM | POA: Diagnosis not present

## 2020-04-09 DIAGNOSIS — Z3201 Encounter for pregnancy test, result positive: Secondary | ICD-10-CM | POA: Diagnosis not present

## 2020-04-09 LAB — URINALYSIS, ROUTINE W REFLEX MICROSCOPIC
Bilirubin Urine: NEGATIVE
Glucose, UA: NEGATIVE mg/dL
Hgb urine dipstick: NEGATIVE
Ketones, ur: NEGATIVE mg/dL
Leukocytes,Ua: NEGATIVE
Nitrite: NEGATIVE
Protein, ur: 30 mg/dL — AB
Specific Gravity, Urine: 1.032 — ABNORMAL HIGH (ref 1.005–1.030)
pH: 5 (ref 5.0–8.0)

## 2020-04-09 LAB — CBC
HCT: 39.6 % (ref 36.0–46.0)
Hemoglobin: 12.8 g/dL (ref 12.0–15.0)
MCH: 31.3 pg (ref 26.0–34.0)
MCHC: 32.3 g/dL (ref 30.0–36.0)
MCV: 96.8 fL (ref 80.0–100.0)
Platelets: 331 10*3/uL (ref 150–400)
RBC: 4.09 MIL/uL (ref 3.87–5.11)
RDW: 12.5 % (ref 11.5–15.5)
WBC: 13.3 10*3/uL — ABNORMAL HIGH (ref 4.0–10.5)
nRBC: 0 % (ref 0.0–0.2)

## 2020-04-09 LAB — HCG, QUANTITATIVE, PREGNANCY: hCG, Beta Chain, Quant, S: 683 m[IU]/mL — ABNORMAL HIGH (ref <5)

## 2020-04-09 LAB — WET PREP, GENITAL
Clue Cells Wet Prep HPF POC: NONE SEEN
Sperm: NONE SEEN
Trich, Wet Prep: NONE SEEN
Yeast Wet Prep HPF POC: NONE SEEN

## 2020-04-09 IMAGING — US US OB < 14 WEEKS - US OB TV
1 series · 15 of 28 positions shown · non-contrast
Comparison: None.

CLINICAL DATA: Positive pregnancy test

EXAM:
OBSTETRIC <14 WK US AND TRANSVAGINAL OB US
TECHNIQUE: Both transabdominal and transvaginal ultrasound examinations were
performed for complete evaluation of the gestation as well as the
maternal uterus, adnexal regions, and pelvic cul-de-sac.
Transvaginal technique was performed to assess early pregnancy.

[Series 1: us ob < 14 weeks - us ob tv · 51 acquisitions, 15 frames shown]
[im 1/51]
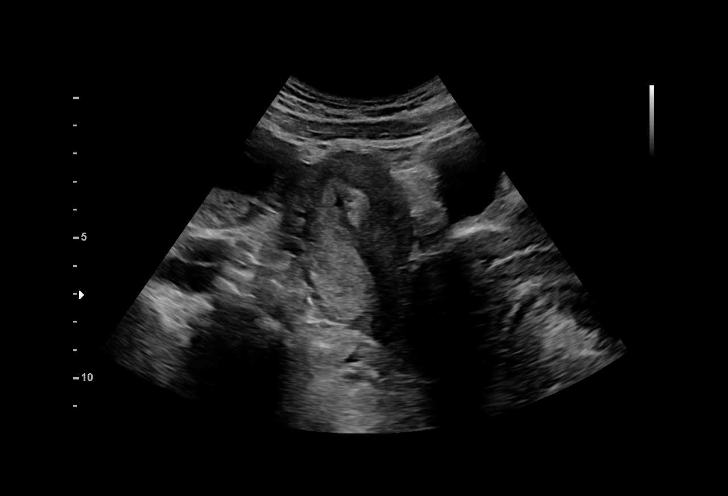
[im 4/51]
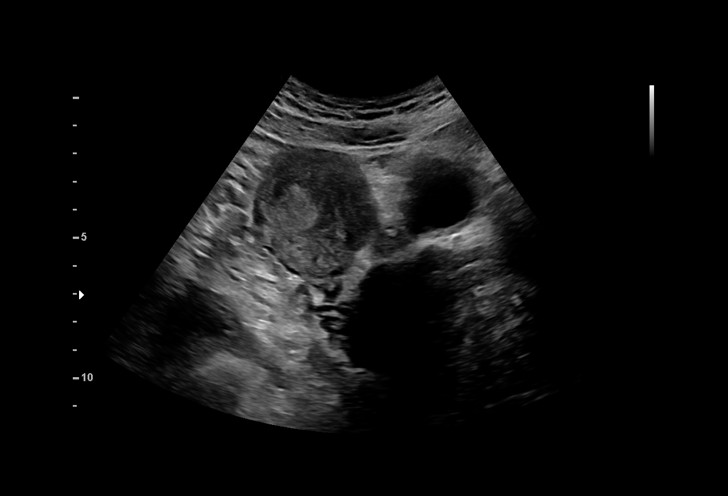
[im 8/51]
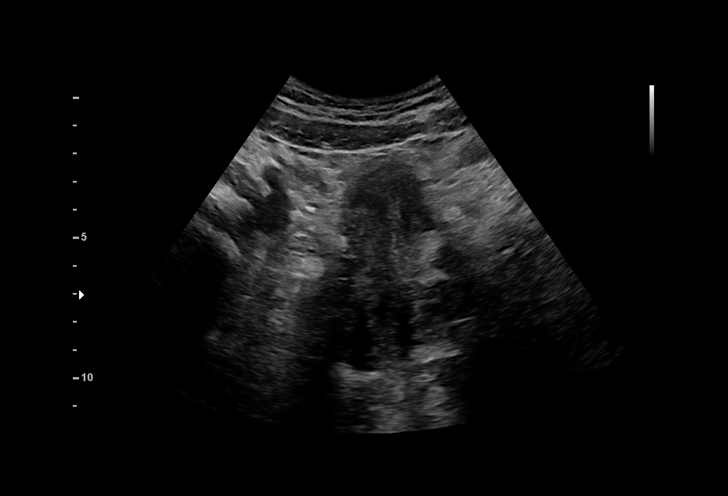
[im 12/51]
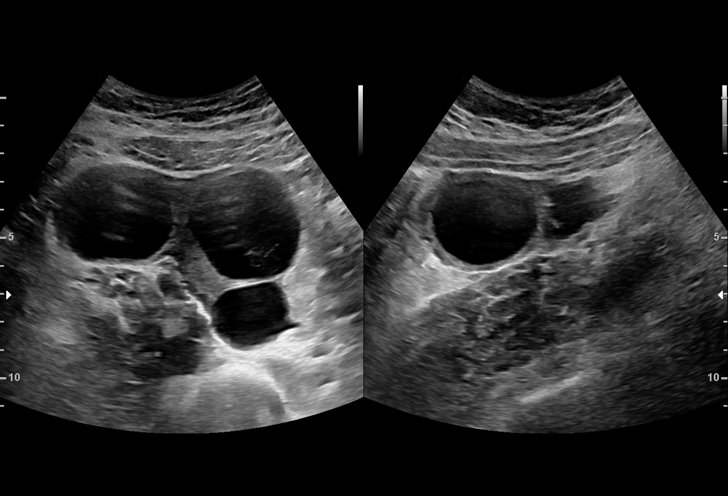
[im 15/51]
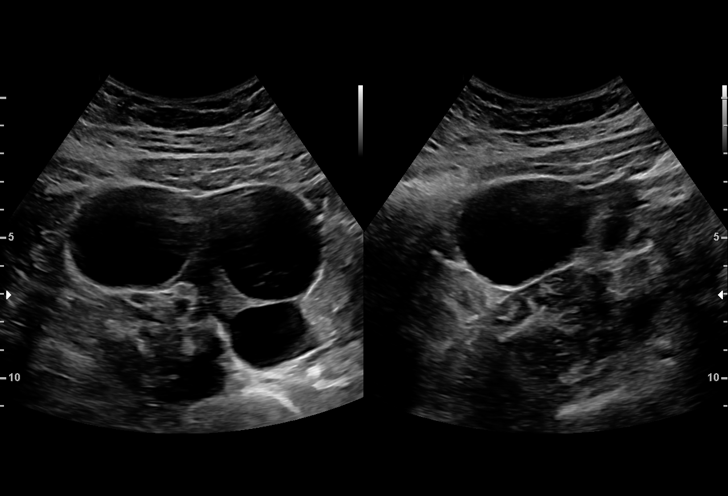
[im 19/51]
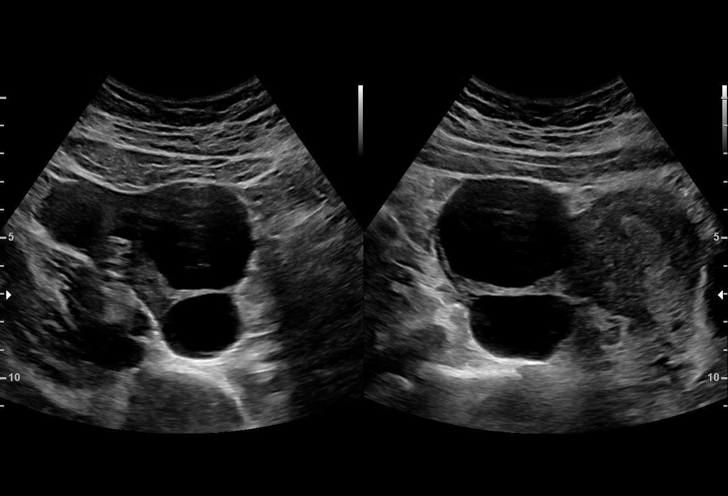
[im 23/51]
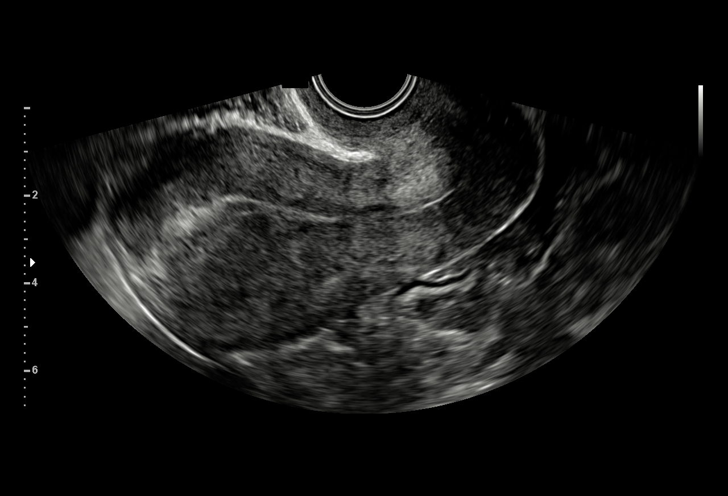
[im 26/51]
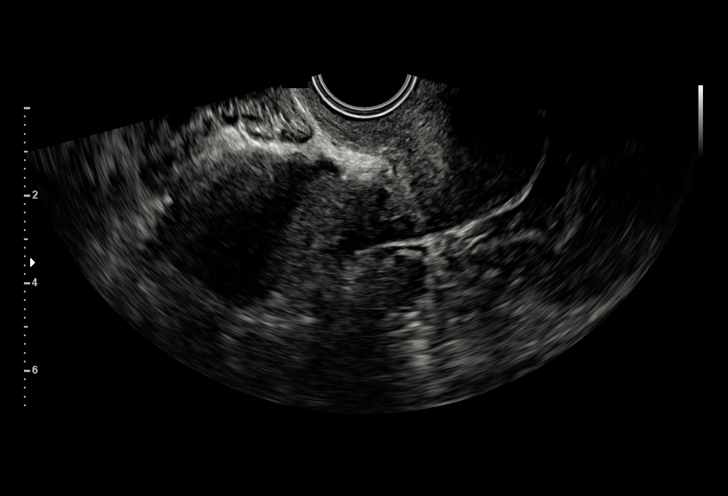
[im 28/51]
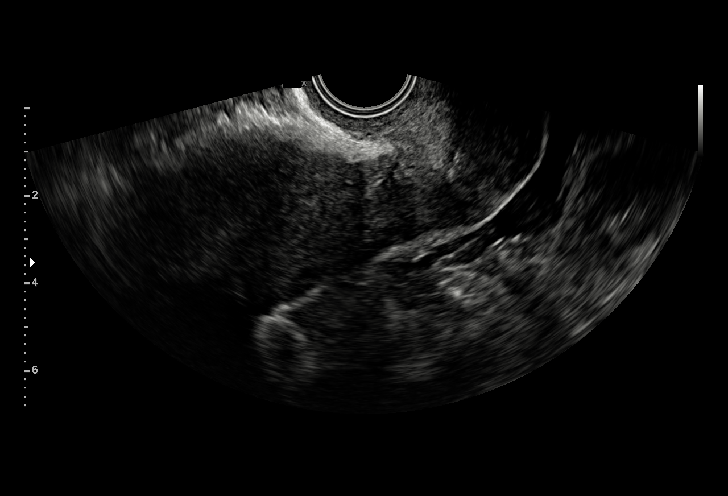
[im 32/51]
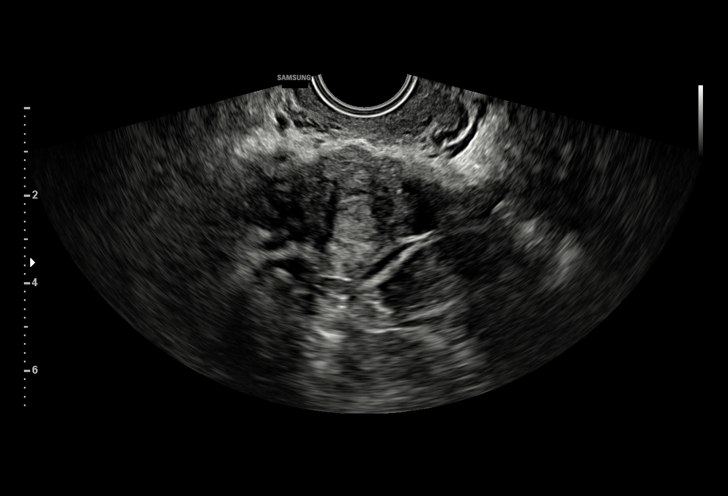
[im 36/51]
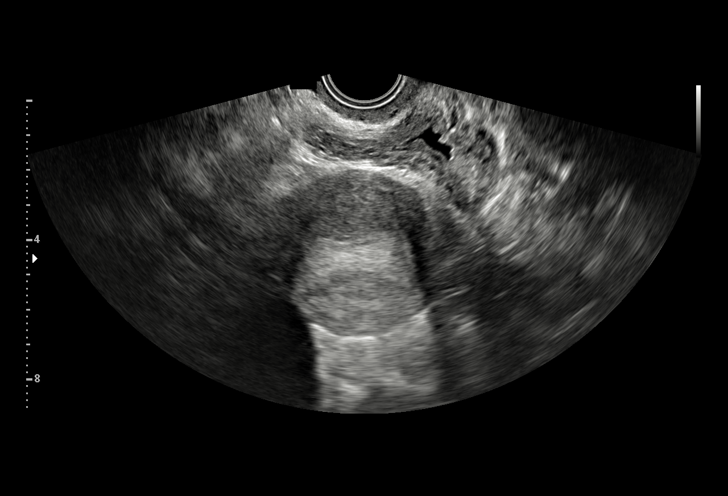
[im 39/51]
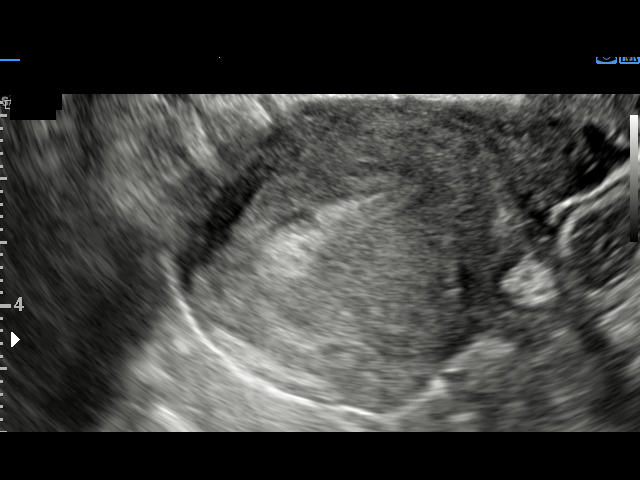
[im 43/51]
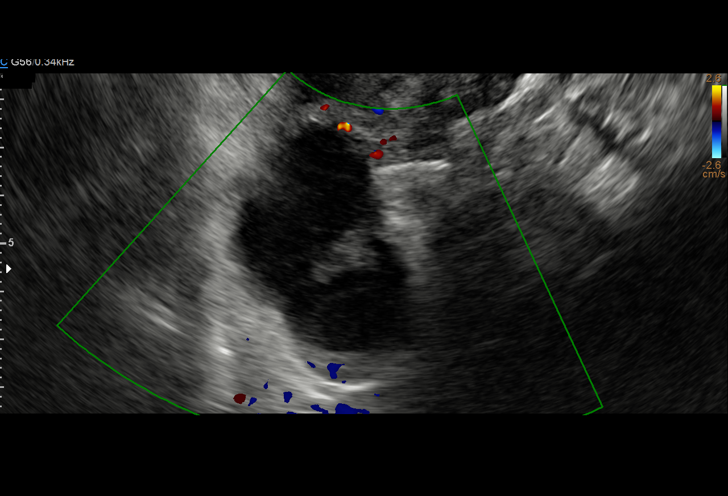
[im 47/51]
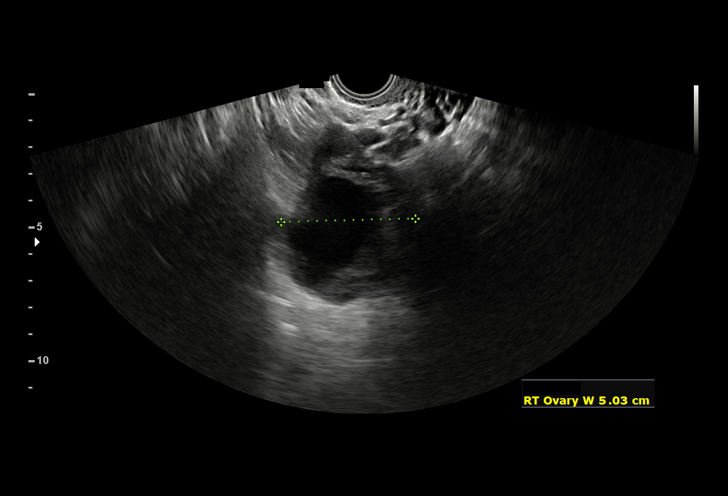
[im 51/51]
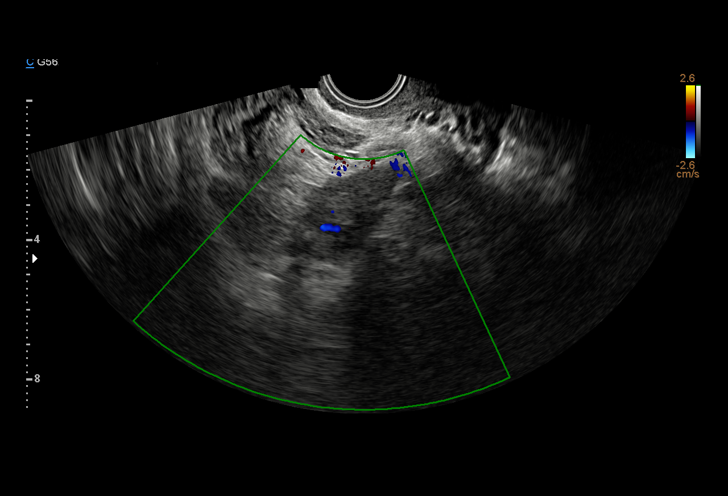

[15 of 28 positions shown; findings below may reference images not displayed]

FINDINGS: Intrauterine gestational sac: None

Yolk sac:  Not Visualized.

Subchorionic hemorrhage:  None visualized.

Maternal uterus/adnexae: Multiple anechoic cysts are seen within the
right ovary the largest measuring 4.9 x 5.1 x 3.7 cm. The left ovary
is normal in appearance. There is trace free fluid seen within the
endometrial canal.
IMPRESSION: Given the history of a positive pregnancy test, differential
considerations include intrauterine gestation too early to
visualize, completed abortion, or non-visualized ectopic pregnancy.
Close clinical correlation is recommended with serial beta-hCG and
followup ultrasound as warranted.

## 2020-04-09 NOTE — MAU Provider Note (Signed)
History     CSN: [id number]  Arrival date and time: 04/09/20 1654   First Provider Initiated Contact with Patient 04/09/20 1830      Chief Complaint  Patient presents with  . Abdominal Pain  . Back Pain   29 y.o. [id number] @[redacted]w[redacted]d  wks by unsure LMP presenting with LBP and RLQ pain. Pain started yesterday. Back pain is bilateral. Rates pain 7/10. Has not tried anything for it. Denies lifting, pushing, pulling, or injury. Denies VB or discharge. No fevers. No urinary sx.   OB History    Gravida  5   Para  3   Term  3   Preterm  0   AB  1   Living  3     SAB  1   TAB  0   Ectopic  0   Multiple  0   Live Births  3           Past Medical History:  Diagnosis Date  . BV (bacterial vaginosis)   . Chlamydia   . Herpes genitalis in women   . HSV infection   . Migraines   . Miscarriage   . Trichomonas   . UTI (lower urinary tract infection)     Past Surgical History:  Procedure Laterality Date  . NO PAST SURGERIES    . WISDOM TOOTH EXTRACTION      Family History  Problem Relation Age of Onset  . Hypertension Mother   . Hypotension Mother   . Healthy Brother   . Hypertension Brother   . Healthy Father   . Breast cancer Maternal Grandmother   . Healthy Brother   . Colon cancer Neg Hx   . Ovarian cancer Neg Hx   . Cervical cancer Neg Hx     Social History   Tobacco Use  . Smoking status: Never Smoker  . Smokeless tobacco: Never Used  Substance Use Topics  . Alcohol use: Not Currently    Comment: socially, occasionally  . Drug use: No    Allergies: No Known Allergies  Medications Prior to Admission  Medication Sig Dispense Refill Last Dose  . Prenatal Vit-Fe Fumarate-FA (PRENATAL VITAMIN) 27-0.8 MG TABS Take 1 tablet by mouth daily. 100 tablet 0 04/08/2020 at Unknown time  . fluconazole (DIFLUCAN) 150 MG tablet Take 1 tablet (150 mg total) by mouth daily. 1 tablet once by mouth.  May repeat in 7 days if needed. (Patient not taking: Reported on  04/07/2020) 1 tablet 1   . metroNIDAZOLE (FLAGYL) 500 MG tablet Take 1 tablet (500 mg total) by mouth 2 (two) times daily. (Patient not taking: Reported on 04/07/2020) 14 tablet 0     Review of Systems  Constitutional: Negative for chills and fever.  Gastrointestinal: Positive for abdominal pain.  Genitourinary: Negative for dysuria, hematuria, urgency, vaginal bleeding and vaginal discharge.  Musculoskeletal: Positive for back pain.   Physical Exam   Blood pressure 126/77, pulse 83, temperature 98.9 F (37.2 C), temperature source Oral, resp. rate 16, height 5\' 5"  (1.651 m), weight 81 kg, last menstrual period 03/05/2020, SpO2 99 %.  Physical Exam  Nursing note and vitals reviewed. Constitutional: She is oriented to person, place, and time. She appears well-developed and well-nourished. No distress.  HENT:  Head: Normocephalic and atraumatic.  Cardiovascular: Normal rate.  Respiratory: No respiratory distress.  GI: Soft. She exhibits no distension and no mass. There is no abdominal tenderness. There is no rebound, no guarding and no CVA tenderness.  Musculoskeletal:  General: Normal range of motion.     Cervical back: Normal range of motion. Normal.     Thoracic back: Normal.     Lumbar back: Normal.  Neurological: She is alert and oriented to person, place, and time.  Skin: Skin is warm and dry.  Psychiatric: She has a normal mood and affect.   Results for orders placed or performed during the hospital encounter of 04/09/20 (from the past 24 hour(s))  Urinalysis, Routine w reflex microscopic     Status: Abnormal   Collection Time: 04/09/20  6:00 PM  Result Value Ref Range   Color, Urine YELLOW YELLOW   APPearance CLEAR CLEAR   Specific Gravity, Urine 1.032 (H) 1.005 - 1.030   pH 5.0 5.0 - 8.0   Glucose, UA NEGATIVE NEGATIVE mg/dL   Hgb urine dipstick NEGATIVE NEGATIVE   Bilirubin Urine NEGATIVE NEGATIVE   Ketones, ur NEGATIVE NEGATIVE mg/dL   Protein, ur 30 (A)  NEGATIVE mg/dL   Nitrite NEGATIVE NEGATIVE   Leukocytes,Ua NEGATIVE NEGATIVE   RBC / HPF 0-5 0 - 5 RBC/hpf   WBC, UA 0-5 0 - 5 WBC/hpf   Bacteria, UA RARE (A) NONE SEEN   Squamous Epithelial / LPF 0-5 0 - 5   Mucus PRESENT   CBC     Status: Abnormal   Collection Time: 04/09/20  6:24 PM  Result Value Ref Range   WBC 13.3 (H) 4.0 - 10.5 K/uL   RBC 4.09 3.87 - 5.11 MIL/uL   Hemoglobin 12.8 12.0 - 15.0 g/dL   HCT 64.3 32.9 - 51.8 %   MCV 96.8 80.0 - 100.0 fL   MCH 31.3 26.0 - 34.0 pg   MCHC 32.3 30.0 - 36.0 g/dL   RDW 84.1 66.0 - 63.0 %   Platelets 331 150 - 400 K/uL   nRBC 0.0 0.0 - 0.2 %  hCG, quantitative, pregnancy     Status: Abnormal   Collection Time: 04/09/20  6:24 PM  Result Value Ref Range   hCG, Beta Chain, Quant, S 683 (H) <5 mIU/mL  Wet prep, genital     Status: Abnormal   Collection Time: 04/09/20  6:38 PM   Specimen: PATH Cytology Cervicovaginal Ancillary Only  Result Value Ref Range   Yeast Wet Prep HPF POC NONE SEEN NONE SEEN   Trich, Wet Prep NONE SEEN NONE SEEN   Clue Cells Wet Prep HPF POC NONE SEEN NONE SEEN   WBC, Wet Prep HPF POC FEW (A) NONE SEEN   Sperm NONE SEEN    US OB LESS THAN 14 WEEKS WITH OB TRANSVAGINAL  Result Date: 04/09/2020 CLINICAL DATA:  Positive pregnancy test EXAM: OBSTETRIC <14 WK [place] AND TRANSVAGINAL OB US TECHNIQUE: Both transabdominal and transvaginal ultrasound examinations were performed for complete evaluation of the gestation as well as the maternal uterus, adnexal regions, and pelvic cul-de-sac. Transvaginal technique was performed to assess early pregnancy. COMPARISON:  None. FINDINGS: Intrauterine gestational sac: None Yolk sac:  Not Visualized. Subchorionic hemorrhage:  None visualized. Maternal uterus/adnexae: Multiple anechoic cysts are seen within the right ovary the largest measuring 4.9 x 5.1 x 3.7 cm. The left ovary is normal in appearance. There is trace free fluid seen within the endometrial canal. IMPRESSION: Given the  history of a positive pregnancy test, differential considerations include intrauterine gestation too early to visualize, completed abortion, or non-visualized ectopic pregnancy. Close clinical correlation is recommended with serial beta-hCG and followup ultrasound as warranted. Electronically Signed   By: Heywood Bene.D.  On: 04/09/2020 20:14   MAU Course  Procedures  MDM Labs and [place] ordered and reviewed. No IUP or adnexal mass seen on [place], findings could indicate early pregnancy, ectopic pregnancy, or failed pregnancy-discussed with pt. Will follow quant in 48 hrs. Pain likely from ovarian cyst. Stable for discharge home.   Assessment and Plan   1. Pregnancy, location unknown   2. Abdominal pain in pregnancy   3. Cyst of right ovary    Discharge home Follow up at [facility] on 04/12/20 @0830  Ectopic precautions  Tylenol prn Heating pad prn  Allergies as of 04/09/2020   No Known Allergies     Medication List    STOP taking these medications   fluconazole 150 MG tablet Commonly known as: Diflucan   metroNIDAZOLE 500 MG tablet Commonly known as: FLAGYL     TAKE these medications   Prenatal Vitamin 27-0.8 MG Tabs Take 1 tablet by mouth daily.      04/11/2020, CNM 04/09/2020, 8:50 PM

## 2020-04-09 NOTE — Discharge Instructions (Signed)
Abdominal Pain During Pregnancy  Belly (abdominal) pain is common during pregnancy. There are many possible causes. Most of the time, it is not a serious problem. Other times, it can be a sign that something is wrong with the pregnancy. Always tell your doctor if you have belly pain. Follow these instructions at home:  Do not have sex or put anything in your vagina until your pain goes away completely.  Get plenty of rest until your pain gets better.  Drink enough fluid to keep your pee (urine) pale yellow.  Take over-the-counter and prescription medicines only as told by your doctor.  Keep all follow-up visits as told by your doctor. This is important. Contact a doctor if:  Your pain continues or gets worse after resting.  You have lower belly pain that: ? Comes and goes at regular times. ? Spreads to your back. ? Feels like menstrual cramps.  You have pain or burning when you pee (urinate). Get help right away if:  You have a fever or chills.  You have vaginal bleeding.  You are leaking fluid from your vagina.  You are passing tissue from your vagina.  You throw up (vomit) for more than 24 hours.  You have watery poop (diarrhea) for more than 24 hours.  Your baby is moving less than usual.  You feel very weak or faint.  You have shortness of breath.  You have very bad pain in your upper belly. Summary  Belly (abdominal) pain is common during pregnancy. There are many possible causes.  If you have belly pain during pregnancy, tell your doctor right away.  Keep all follow-up visits as told by your doctor. This is important. This information is not intended to replace advice given to you by your health care provider. Make sure you discuss any questions you have with your health care provider. Document Revised: 03/03/2019 Document Reviewed: 02/15/2017 Elsevier Patient Education  2020 [employer]  

## 2020-04-09 NOTE — MAU Note (Signed)
Pain started yesterday, sharp pain in low back and rt side. No bleeding.

## 2020-04-09 NOTE — ED Triage Notes (Signed)
Pt arrives to ED w/ c/o abdominal pain. Pt pregnant, unknown gestation. Pt denies vaginal bleeding, denies n/v/d.

## 2020-04-12 ENCOUNTER — Ambulatory Visit: Payer: Medicaid Other

## 2020-04-12 LAB — GC/CHLAMYDIA PROBE AMP (~~LOC~~) NOT AT ARMC
Chlamydia: NEGATIVE
Comment: NEGATIVE
Comment: NORMAL
Neisseria Gonorrhea: NEGATIVE

## 2020-04-28 ENCOUNTER — Encounter: Payer: Self-pay | Admitting: Family Medicine

## 2020-04-28 ENCOUNTER — Ambulatory Visit: Payer: Medicaid Other | Admitting: Family Medicine

## 2020-04-28 ENCOUNTER — Other Ambulatory Visit: Payer: Self-pay

## 2020-04-28 VITALS — BP 108/69 | HR 83 | Temp 97.1°F | Wt 179.0 lb

## 2020-04-28 DIAGNOSIS — Z3009 Encounter for other general counseling and advice on contraception: Secondary | ICD-10-CM

## 2020-04-28 DIAGNOSIS — O039 Complete or unspecified spontaneous abortion without complication: Secondary | ICD-10-CM | POA: Diagnosis not present

## 2020-04-28 NOTE — Patient Instructions (Addendum)
Etonogestrel implant What is this medicine? ETONOGESTREL (et oh noe JES trel) is a contraceptive (birth control) device. It is used to prevent pregnancy. It can be used for up to 3 years. This medicine may be used for other purposes; ask your health care provider or pharmacist if you have questions. COMMON BRAND NAME(S): Implanon, Nexplanon What should I tell my health care provider before I take this medicine? They need to know if you have any of these conditions:  abnormal vaginal bleeding  blood vessel disease or blood clots  breast, cervical, endometrial, ovarian, liver, or uterine cancer  diabetes  gallbladder disease  heart disease or recent heart attack  high blood pressure  high cholesterol or triglycerides  kidney disease  liver disease  migraine headaches  seizures  stroke  tobacco smoker  an unusual or allergic reaction to etonogestrel, anesthetics or antiseptics, other medicines, foods, dyes, or preservatives  pregnant or trying to get pregnant  breast-feeding How should I use this medicine? This device is inserted just under the skin on the inner side of your upper arm by a health care professional. Talk to your pediatrician regarding the use of this medicine in children. Special care may be needed. Overdosage: If you think you have taken too much of this medicine contact a poison control center or emergency room at once. NOTE: This medicine is only for you. Do not share this medicine with others. What if I miss a dose? This does not apply. What may interact with this medicine? Do not take this medicine with any of the following medications:  amprenavir  fosamprenavir This medicine may also interact with the following medications:  acitretin  aprepitant  armodafinil  bexarotene  bosentan  carbamazepine  certain medicines for fungal infections like fluconazole, ketoconazole, itraconazole and voriconazole  certain medicines to treat  hepatitis, HIV or AIDS  cyclosporine  felbamate  griseofulvin  lamotrigine  modafinil  oxcarbazepine  phenobarbital  phenytoin  primidone  rifabutin  rifampin  rifapentine  St. John's wort  topiramate This list may not describe all possible interactions. Give your health care provider a list of all the medicines, herbs, non-prescription drugs, or dietary supplements you use. Also tell them if you smoke, drink alcohol, or use illegal drugs. Some items may interact with your medicine. What should I watch for while using this medicine? This product does not protect you against HIV infection (AIDS) or other sexually transmitted diseases. You should be able to feel the implant by pressing your fingertips over the skin where it was inserted. Contact your doctor if you cannot feel the implant, and use a non-hormonal birth control method (such as condoms) until your doctor confirms that the implant is in place. Contact your doctor if you think that the implant may have broken or become bent while in your arm. You will receive a user card from your health care provider after the implant is inserted. The card is a record of the location of the implant in your upper arm and when it should be removed. Keep this card with your health records. What side effects may I notice from receiving this medicine? Side effects that you should report to your doctor or health care professional as soon as possible:  allergic reactions like skin rash, itching or hives, swelling of the face, lips, or tongue  breast lumps, breast tissue changes, or discharge  breathing problems  changes in emotions or moods  coughing up blood  if you feel that the implant   may have broken or bent while in your arm  high blood pressure  pain, irritation, swelling, or bruising at the insertion site  scar at site of insertion  signs of infection at the insertion site such as fever, and skin redness, pain or  discharge  signs and symptoms of a blood clot such as breathing problems; changes in vision; chest pain; severe, sudden headache; pain, swelling, warmth in the leg; trouble speaking; sudden numbness or weakness of the face, arm or leg  signs and symptoms of liver injury like dark yellow or brown urine; general ill feeling or flu-like symptoms; light-colored stools; loss of appetite; nausea; right upper belly pain; unusually weak or tired; yellowing of the eyes or skin  unusual vaginal bleeding, discharge Side effects that usually do not require medical attention (report to your doctor or health care professional if they continue or are bothersome):  acne  breast pain or tenderness  headache  irregular menstrual bleeding  nausea This list may not describe all possible side effects. Call your doctor for medical advice about side effects. You may report side effects to FDA at 1-800-FDA-1088. Where should I keep my medicine? This drug is given in a hospital or clinic and will not be stored at home. NOTE: This sheet is a summary. It may not cover all possible information. If you have questions about this medicine, talk to your doctor, pharmacist, or health care provider.  2020 Elsevier/Gold Standard (2019-08-26 11:33:04)  

## 2020-04-28 NOTE — Progress Notes (Signed)
Established patient visit   Patient: Jody Perez   DOB: 05-07-1991   29 y.o. Female  MRN: [id number] Visit Date: 04/28/2020  Today's healthcare provider: Lavon Paganini, MD   Chief Complaint  Patient presents with  . Contraception    Pt would like to discuss options for birth control    Subjective    HPI  Pt would like to restart Birth Control.  She reports having a Miscarriage about 10 days ago.  She has been on Depo in the past, but would also like to discuss other options as well.    She was seen by ob/gyn on 5/12 for Depo shot at 23 weeks since last Depo and pregnancy test was positive.  She was counseled and given prenatal vitamins and a pregnancy packet and discussed prenatal care.  She was then seen on 04/09/2020 in the emergency department for right adnexal pain.  She was diagnosed with a right ovarian cyst.  Quantitative hCG was 683.  Slightly elevated WBC at 13.3.  Ultrasound showed no intrauterine gestational sac, multiple anechoic cysts of the right ovary with the largest measuring 4.9 x 5.1 x 3.7 cm.  Left ovary was normal in appearance.  Given the positive pregnancy test with no intrauterine gestation noted on ultrasound, she was advised to have serial beta hCG follow-ups and ultrasound.  She states that she never went back to be seen for this.  She believes that she had a miscarriage at home.  She had significant bleeding and cramping which self resolved about 10 days ago.  Patient Active Problem List   Diagnosis Date Noted  . Obesity BMI=30.1 04/07/2020  . Recurrent vaginitis 06/06/2019  . Anemia 03/27/2018  . History of genital HSV 01/28/2013   Past Medical History:  Diagnosis Date  . BV (bacterial vaginosis)   . Chlamydia   . Herpes genitalis in women   . HSV infection   . Migraines   . Miscarriage   . Trichomonas   . UTI (lower urinary tract infection)    Social History   Tobacco Use  . Smoking status: Never Smoker  . Smokeless tobacco: Never  Used  Substance Use Topics  . Alcohol use: Not Currently    Comment: socially, occasionally  . Drug use: No   No Known Allergies   Medications: Outpatient Medications Prior to Visit  Medication Sig  . Prenatal Vit-Fe Fumarate-FA (PRENATAL VITAMIN) 27-0.8 MG TABS Take 1 tablet by mouth daily.   No facility-administered medications prior to visit.    Review of Systems  Constitutional: Negative.   Genitourinary: Positive for vaginal bleeding.  Neurological: Negative for dizziness, light-headedness and headaches.    Last CBC Lab Results  Component Value Date   WBC 13.3 (H) 04/09/2020   HGB 12.8 04/09/2020   HCT 39.6 04/09/2020   MCV 96.8 04/09/2020   MCH 31.3 04/09/2020   RDW 12.5 04/09/2020   PLT 331 08/65/7846   Last metabolic panel Lab Results  Component Value Date   GLUCOSE 68 06/06/2019   NA 140 06/06/2019   K 3.6 06/06/2019   CL 101 06/06/2019   CO2 25 06/06/2019   BUN 12 06/06/2019   CREATININE 0.91 06/06/2019   GFRNONAA 87 06/06/2019   GFRAA 100 06/06/2019   CALCIUM 9.2 06/06/2019   PROT 7.1 06/06/2019   ALBUMIN 4.3 06/06/2019   LABGLOB 2.8 06/06/2019   AGRATIO 1.5 06/06/2019   BILITOT 0.3 06/06/2019   ALKPHOS 71 06/06/2019   AST 15 06/06/2019  ALT 7 06/06/2019   ANIONGAP 6 09/07/2017   Last lipids Lab Results  Component Value Date   CHOL 157 06/06/2019   HDL 49 06/06/2019   LDLCALC 91 06/06/2019   TRIG 85 06/06/2019   CHOLHDL 3.2 06/06/2019   Last hemoglobin A1c No results found for: HGBA1C Last thyroid functions No results found for: TSH, T3TOTAL, T4TOTAL, THYROIDAB  Objective    BP 108/69 (BP Location: Left Arm, Patient Position: Sitting, Cuff Size: Normal)   Pulse 83   Temp (!) 97.1 F (36.2 C) (Temporal)   Wt 179 lb (81.2 kg)   LMP 03/05/2020 (Approximate)   BMI 29.79 kg/m   Physical Exam Constitutional:      Appearance: Normal appearance.  Eyes:     Conjunctiva/sclera: Conjunctivae normal.  Skin:    General: Skin is warm  and dry.  Neurological:     Mental Status: She is alert and oriented to person, place, and time. Mental status is at baseline.  Psychiatric:        Mood and Affect: Mood normal.        Behavior: Behavior normal.        Thought Content: Thought content normal.        Judgment: Judgment normal.       No results found for any visits on 04/28/20.  Assessment & Plan     1. Encounter for other general counseling or advice on contraception 2. SAB (spontaneous abortion) -Counseled on types of birth control -Patient would like to try Nexplanon given her options -We discussed risks and benefits of LARCs and Nexplanon specifically -Urine pregnancy test was positive today, however -Patient describes presumed spontaneous abortion recently at home -We will get beta-hCG quant to compare to previous hCG to see if this is decreasing or increasing prior to giving any contraceptive -Discussed safe sex practices -Patient not currently sexually active -Discussed to use condoms in the meantime -If quantitative hCG is negative, we will go ahead with Nexplanon insertion soon - Beta HCG, Quant  Total time spent on today's visit was greater than 30 minutes, including both face-to-face time and nonface-to-face time personally spent on review of chart (labs and imaging), discussing labs and goals, discussing further work-up, treatment options, referrals to specialist if needed, reviewing outside records of pertinent, answering patient's questions, and coordinating care.    Return in about 1 week (around 05/05/2020) for pending HCG.      I, [name], MD, have reviewed all documentation for this visit. The documentation on 04/28/20 for the exam, diagnosis, procedures, and orders are all accurate and complete.   Angellynn Kimberlin, Marzella Schlein, MD, MPH [facility]

## 2020-04-29 ENCOUNTER — Telehealth: Payer: Self-pay

## 2020-04-29 ENCOUNTER — Telehealth: Payer: Self-pay | Admitting: *Deleted

## 2020-04-29 LAB — BETA HCG QUANT (REF LAB): hCG Quant: 434 m[IU]/mL

## 2020-04-29 NOTE — Telephone Encounter (Signed)
Pt called in and was given the message from Dr. Beryle Flock dated 04/29/2020 at 8:25 AM. No questions from pt.

## 2020-04-29 NOTE — Telephone Encounter (Signed)
LMTCB, PEC may give result.  

## 2020-04-29 NOTE — Telephone Encounter (Signed)
-----   Message from Erasmo Downer, MD sent at 04/29/2020  8:25 AM EDT ----- Hcg has come down slightly, but is still elevated.  Please f/u with GYN to confirm suspected miscarriage.

## 2020-05-06 ENCOUNTER — Ambulatory Visit: Payer: Medicaid Other

## 2020-05-11 ENCOUNTER — Encounter: Payer: Self-pay | Admitting: Family Medicine

## 2020-05-11 ENCOUNTER — Ambulatory Visit (LOCAL_COMMUNITY_HEALTH_CENTER): Payer: Medicaid Other | Admitting: Family Medicine

## 2020-05-11 ENCOUNTER — Other Ambulatory Visit: Payer: Self-pay

## 2020-05-11 VITALS — BP 121/77 | Ht 64.0 in | Wt 177.0 lb

## 2020-05-11 DIAGNOSIS — Z3009 Encounter for other general counseling and advice on contraception: Secondary | ICD-10-CM | POA: Diagnosis not present

## 2020-05-11 DIAGNOSIS — Z1388 Encounter for screening for disorder due to exposure to contaminants: Secondary | ICD-10-CM | POA: Diagnosis not present

## 2020-05-11 DIAGNOSIS — Z30013 Encounter for initial prescription of injectable contraceptive: Secondary | ICD-10-CM

## 2020-05-11 DIAGNOSIS — Z0389 Encounter for observation for other suspected diseases and conditions ruled out: Secondary | ICD-10-CM | POA: Diagnosis not present

## 2020-05-11 MED ORDER — MEDROXYPROGESTERONE ACETATE 150 MG/ML IM SUSP
150.0000 mg | Freq: Once | INTRAMUSCULAR | Status: AC
  Administered 2020-05-11: 150 mg via INTRAMUSCULAR

## 2020-05-11 NOTE — Progress Notes (Signed)
Family Planning Visit- Repeat Yearly Visit  Subjective:  Jody Perez is a 29 y.o. [phone]  being seen today for an well woman visit and to discuss family planning options.    She is currently using abstinence for pregnancy prevention. Patient reports she does not if she or her partner wants a pregnancy in the next year. Patient  has History of genital HSV; Anemia; Recurrent vaginitis; and Obesity BMI=30.1 on their problem list.  Chief Complaint  Patient presents with  . Contraception    Depo    Patient reports she is here to start Depo.  States that she had a SAB 03/2020.  Denies sexual activity x 1 month.  LMP  05/03/2020 ( 04/09/2020 @[facility]  beta hCG was 683.   beta hCG- 434 on 04/28/20-per Epic ordered by [facility]) Patient denies vaginal disch.   States that she has frequent headaches  Which she treats with ibuprofen.  States that she is supposed to wear glasses but chooses not to.  Also states she needs an eye exam.  See flowsheet for other program required questions.   Body mass index is 30.38 kg/m. - Patient is eligible for diabetes screening based on BMI and age >66?  not applicable HA1C ordered? not applicable  Patient reports 1 of partners in last year. Desires STI screening?  No.  Agrees to GC/chlamydia   Has patient been screened once for HCV in the past?  No  No results found for: HCVAB  Does the patient have current of drug use, have a partner with drug use, and/or has been incarcerated since last result? No  If yes-- Screen for HCV through [facility]   Does the patient meet criteria for HBV testing? No  Criteria:  -Household, sexual or needle sharing contact with HBV -History of drug use -HIV positive -Those with known Hep C   Health Maintenance Due  Topic Date Due  . Hepatitis C Screening  Never done  . COVID-19 Vaccine (1) Never done    Review of Systems  Constitutional: Negative.   HENT: Negative.   Eyes: Negative.   Respiratory: Negative.    Cardiovascular: Negative.   Gastrointestinal: Negative.   Genitourinary: Negative.        UTI last month- treated/symptoms resolved  Musculoskeletal: Negative.   Skin: Negative.   Neurological: Positive for headaches.       Frequent headaches- takes ibuprofen to manage.  Client has glasses but doesn't wear.  States she needs eye exam.  Endo/Heme/Allergies: Negative.   Psychiatric/Behavioral: Negative.     The following portions of the patient's history were reviewed and updated as appropriate: allergies, current medications, past family history, past medical history, past social history, past surgical history and problem list. Problem list updated.  Objective:   Vitals:   05/11/20 1026  BP: 121/77  Weight: 177 lb (80.3 kg)  Height: 5\' 4"  (1.626 m)    Physical Exam Constitutional:      Appearance: Normal appearance.  HENT:     Head: Normocephalic.  Cardiovascular:     Rate and Rhythm: Normal rate and regular rhythm.     Heart sounds: Normal heart sounds.  Pulmonary:     Effort: Pulmonary effort is normal.     Breath sounds: Normal breath sounds.  Abdominal:     General: Abdomen is flat.     Palpations: Abdomen is soft.  Genitourinary:    Comments: Client deferred pelvic exam.  Self collect GC/chlamydia today Lymphadenopathy:     Cervical: No  cervical adenopathy.  Skin:    General: Skin is warm and dry.  Neurological:     Mental Status: She is alert and oriented to person, place, and time.    Assessment and Plan:  Jody Perez is a 29 y.o. female [phone] presenting to the [facility] Department for an yearly well woman exam/family planning visit  Contraception counseling: Reviewed all forms of birth control options in the tiered based approach. available including abstinence; over the counter/barrier methods; hormonal contraceptive medication including pill, patch, ring, injection,contraceptive implant, ECP; hormonal and nonhormonal IUDs; permanent  sterilization options including vasectomy and the various tubal sterilization modalities. Risks, benefits, and typical effectiveness rates were reviewed.  Questions were answered.  Written information was also given to the patient to review.  Patient desires Depo, this was prescribed for patient. She will follow up in  11-13 weeks for surveillance.  She was told to call with any further questions, or with any concerns about this method of contraception.  Emphasized use of condoms 100% of the time for STI prevention.  Patient was not a ECP candidate.   ECP counseling was not given - see RN documentation   1. Family planning  - Chlamydia/Gonorrhea [facility] - Beta hCG quant (ref lab) Okay to have Depo today  2. Initiation of Depo Provera  - medroxyPROGESTERone (DEPO-PROVERA) injection 150 mg q 11-13 weeks x 1 year. Co. To use condoms x 2 weeks as back-up and always for STD prevention.    No follow-ups on file.  Future Appointments  Date Time Provider [place]  06/09/2020 10:00 AM Bacigalupo, Dionne Bucy, MD [facility], FNP

## 2020-05-11 NOTE — Progress Notes (Signed)
Here today for PE and Depo. Recently had SAB 04/2020. Last PE was at PCP 06/06/2019. Last Pap Smear was 11/12/2018 (NIL.) Last Depo was 10/29/2019 (27.6 weeks.) Tawny Hopping, RN

## 2020-05-11 NOTE — Progress Notes (Addendum)
Depo given and tolerated well. Declines condoms. Tawny Hopping, RN

## 2020-05-12 LAB — BETA HCG QUANT (REF LAB): hCG Quant: 25 m[IU]/mL

## 2020-05-23 ENCOUNTER — Encounter: Payer: Self-pay | Admitting: Emergency Medicine

## 2020-05-23 ENCOUNTER — Other Ambulatory Visit: Payer: Self-pay

## 2020-05-23 ENCOUNTER — Ambulatory Visit
Admission: EM | Admit: 2020-05-23 | Discharge: 2020-05-23 | Disposition: A | Discharge status: Home / Self Care | Payer: Medicaid Other | Attending: Emergency Medicine | Admitting: Emergency Medicine

## 2020-05-23 DIAGNOSIS — N760 Acute vaginitis: Secondary | ICD-10-CM | POA: Insufficient documentation

## 2020-05-23 DIAGNOSIS — B9689 Other specified bacterial agents as the cause of diseases classified elsewhere: Secondary | ICD-10-CM | POA: Diagnosis not present

## 2020-05-23 DIAGNOSIS — N390 Urinary tract infection, site not specified: Secondary | ICD-10-CM

## 2020-05-23 LAB — URINALYSIS, COMPLETE (UACMP) WITH MICROSCOPIC
Bilirubin Urine: NEGATIVE
Glucose, UA: NEGATIVE mg/dL
Hgb urine dipstick: NEGATIVE
Ketones, ur: NEGATIVE mg/dL
Nitrite: NEGATIVE
Protein, ur: 30 mg/dL — AB
Specific Gravity, Urine: 1.025 (ref 1.005–1.030)
pH: 6 (ref 5.0–8.0)

## 2020-05-23 LAB — WET PREP, GENITAL
Sperm: NONE SEEN
Trich, Wet Prep: NONE SEEN
WBC, Wet Prep HPF POC: NONE SEEN
Yeast Wet Prep HPF POC: NONE SEEN

## 2020-05-23 MED ORDER — FLUCONAZOLE 150 MG PO TABS: 150.0000 mg | ORAL_TABLET | Freq: Once | ORAL | 1 refills | Status: AC

## 2020-05-23 MED ORDER — PHENAZOPYRIDINE HCL 200 MG PO TABS
200.0000 mg | ORAL_TABLET | Freq: Three times a day (TID) | ORAL | 0 refills | Status: DC | PRN
Start: 2020-05-23 — End: 2020-06-30

## 2020-05-23 MED ORDER — NITROFURANTOIN MONOHYD MACRO 100 MG PO CAPS
100.0000 mg | ORAL_CAPSULE | Freq: Two times a day (BID) | ORAL | 0 refills | Status: DC
Start: 2020-05-23 — End: 2020-06-30

## 2020-05-23 MED ORDER — METRONIDAZOLE 500 MG PO TABS: 500.0000 mg | ORAL_TABLET | Freq: Two times a day (BID) | ORAL | 0 refills | Status: AC

## 2020-05-23 NOTE — Discharge Instructions (Addendum)
Your wet prep came back positive for bacterial vaginosis, negative for yeast or trichomonas.  I have sent your urine for culture make sure that you have a urinary tract infection and that we have you on the correct antibiotics.  The Pyridium will turn your urine orange, but will help your urinary symptoms.  Finish the Flagyl.  Start Diflucan if you start getting symptoms of a yeast infection after finishing Flagyl.

## 2020-05-23 NOTE — ED Provider Notes (Signed)
HPI  SUBJECTIVE:  Jody Perez is a 29 y.o. female who presents with 10 days of nonodorous thin vaginal discharge, vaginal itching, dysuria, vulvar irritation.  She reports cloudy urine.  No nausea, vomiting, fevers, abdominal, back, pelvic pain, urinary urgency, frequency, odorous urine, hematuria.  No genital rash, blisters.  No recent antibiotics.  She is in a long-term monogamous relationship with a female who is asymptomatic.  STDs are not a concern today.  No antipyretic in the past 6 hours.  She tried over-the-counter yeast preparation without improvement in her symptoms.  There are no aggravating or alleviating factors.  She has a past medical history of chlamydia, HSV, trichomonas, frequent BV, yeast, frequent UTIs.  No history of gonorrhea, HIV, syphilis, diabetes.  LMP: 2 to 3 weeks ago.  Denies the possibility of being pregnant.  She is on Depo.  PMD: [facility].    Past Medical History:  Diagnosis Date  . BV (bacterial vaginosis)   . Chlamydia   . Herpes genitalis in women   . HSV infection   . Migraines   . Miscarriage   . Trichomonas   . UTI (lower urinary tract infection)     Past Surgical History:  Procedure Laterality Date  . NO PAST SURGERIES    . WISDOM TOOTH EXTRACTION      Family History  Problem Relation Age of Onset  . Hypertension Mother   . Cancer Mother        Pitutary  . Healthy Brother   . Hypertension Brother   . Healthy Father   . Breast cancer Maternal Grandmother   . Healthy Brother   . Colon cancer Neg Hx   . Ovarian cancer Neg Hx   . Cervical cancer Neg Hx     Social History   Tobacco Use  . Smoking status: Never Smoker  . Smokeless tobacco: Never Used  Vaping Use  . Vaping Use: Never used  Substance Use Topics  . Alcohol use: Not Currently    Comment: socially, occasionally  . Drug use: No    No current facility-administered medications for this encounter.  Current Outpatient Medications:  .   medroxyPROGESTERone (DEPO-PROVERA) 150 MG/ML injection, Inject 150 mg into the muscle every 3 (three) months., Disp: , Rfl:  .  fluconazole (DIFLUCAN) 150 MG tablet, Take 1 tablet (150 mg total) by mouth once for 1 dose. 1 tab po x 1. May repeat in 72 hours if no improvement, Disp: 2 tablet, Rfl: 1 .  metroNIDAZOLE (FLAGYL) 500 MG tablet, Take 1 tablet (500 mg total) by mouth 2 (two) times daily for 7 days., Disp: 14 tablet, Rfl: 0 .  nitrofurantoin, macrocrystal-monohydrate, (MACROBID) 100 MG capsule, Take 1 capsule (100 mg total) by mouth 2 (two) times daily. X 5 days, Disp: 10 capsule, Rfl: 0 .  phenazopyridine (PYRIDIUM) 200 MG tablet, Take 1 tablet (200 mg total) by mouth 3 (three) times daily as needed for pain., Disp: 6 tablet, Rfl: 0  No Known Allergies   ROS  As noted in HPI.   Physical Exam  BP 118/81 (BP Location: Left Arm)   Pulse 88   Temp 98.6 F (37 C) (Oral)   Resp 14   Ht 5\' 3"  (1.6 m)   Wt 81.6 kg   LMP 05/03/2020   SpO2 100%   Breastfeeding No   BMI 31.89 kg/m   Constitutional: Well developed, well nourished, no acute distress Eyes:  EOMI, conjunctiva normal bilaterally HENT: Normocephalic, atraumatic,mucus membranes moist Respiratory:  Normal inspiratory effort Cardiovascular: Normal rate GI: nondistended soft, nontender.  No suprapubic or flank tenderness GU: Deferred Back: No CVAT skin: No rash, skin intact Musculoskeletal: no deformities Neurologic: Alert & oriented x 3, no focal neuro deficits Psychiatric: Speech and behavior appropriate   ED Course   Medications - No data to display  Orders Placed This Encounter  Procedures  . Wet prep, genital    Standing Status:   Standing    Number of Occurrences:   1  . Urine culture    Standing Status:   Standing    Number of Occurrences:   1    Order Specific Question:   List patient's active antibiotics    Answer:   macrobid  . Urinalysis, Complete w Microscopic    Standing Status:   Standing     Number of Occurrences:   1    Results for orders placed or performed during the hospital encounter of 05/23/20 (from the past 24 hour(s))  Wet prep, genital     Status: Abnormal   Collection Time: 05/23/20  1:38 PM   Specimen: Vaginal  Result Value Ref Range   Yeast Wet Prep HPF POC NONE SEEN NONE SEEN   Trich, Wet Prep NONE SEEN NONE SEEN   Clue Cells Wet Prep HPF POC PRESENT (A) NONE SEEN   WBC, Wet Prep HPF POC NONE SEEN NONE SEEN   Sperm NONE SEEN   Urinalysis, Complete w Microscopic     Status: Abnormal   Collection Time: 05/23/20  1:38 PM  Result Value Ref Range   Color, Urine YELLOW YELLOW   APPearance HAZY (A) CLEAR   Specific Gravity, Urine 1.025 1.005 - 1.030   pH 6.0 5.0 - 8.0   Glucose, UA NEGATIVE NEGATIVE mg/dL   Hgb urine dipstick NEGATIVE NEGATIVE   Bilirubin Urine NEGATIVE NEGATIVE   Ketones, ur NEGATIVE NEGATIVE mg/dL   Protein, ur 30 (A) NEGATIVE mg/dL   Nitrite NEGATIVE NEGATIVE   Leukocytes,Ua TRACE (A) NEGATIVE   Squamous Epithelial / LPF 6-10 0 - 5   WBC, UA 6-10 0 - 5 WBC/hpf   RBC / HPF 0-5 0 - 5 RBC/hpf   Bacteria, UA FEW (A) NONE SEEN   Mucus PRESENT    No results found.  ED Clinical Impression  1. BV (bacterial vaginosis)   2. Urinary tract infection without hematuria, site unspecified     ED Assessment/Plan  Wet prep positive for BV.  Home with Flagyl.  Also Diflucan as she states that she frequently gets yeast infections after taking Flagyl.  Patient declined testing for gonorrhea, chlamydia, HIV, syphilis.  UA trace leukocytes, few bacteria, although it is a contaminated sample.  However symptoms are suggestive of a UTI.  Will send home with Macrobid and Pyridium as well.  Urine sent off for culture to confirm diagnosis and antibiotic choice.  Follow-up with PMD as needed. Discussed labs, MDM, plan and followup with patient. Pt agrees with plan.   Meds ordered this encounter  Medications  . metroNIDAZOLE (FLAGYL) 500 MG tablet     Sig: Take 1 tablet (500 mg total) by mouth 2 (two) times daily for 7 days.    Dispense:  14 tablet    Refill:  0  . fluconazole (DIFLUCAN) 150 MG tablet    Sig: Take 1 tablet (150 mg total) by mouth once for 1 dose. 1 tab po x 1. May repeat in 72 hours if no improvement    Dispense:  2 tablet  Refill:  1  . phenazopyridine (PYRIDIUM) 200 MG tablet    Sig: Take 1 tablet (200 mg total) by mouth 3 (three) times daily as needed for pain.    Dispense:  6 tablet    Refill:  0  . nitrofurantoin, macrocrystal-monohydrate, (MACROBID) 100 MG capsule    Sig: Take 1 capsule (100 mg total) by mouth 2 (two) times daily. X 5 days    Dispense:  10 capsule    Refill:  0    *This clinic note was created using Scientist, clinical (histocompatibility and immunogenetics). Therefore, there may be occasional mistakes despite careful proofreading.  ?     Domenick Gong, MD 05/24/20 1009

## 2020-05-23 NOTE — ED Triage Notes (Signed)
Patient c/o vaginal discharge and itching that started 2-3 days ago.  Patient not concern with STDs.

## 2020-05-25 LAB — URINE CULTURE

## 2020-05-27 DIAGNOSIS — Z419 Encounter for procedure for purposes other than remedying health state, unspecified: Secondary | ICD-10-CM | POA: Diagnosis not present

## 2020-06-07 ENCOUNTER — Telehealth: Payer: Self-pay

## 2020-06-07 NOTE — Telephone Encounter (Signed)
Yes pt can be scheduled with Teola Bradley, or Marcelino Duster for a CPE if pt doesn't want to wait until Dr. B returns. I LM advising pt. Please reschedule pt when she returns call. Thanks TNP   Copied from CRM [id number]. Topic: Appointment Scheduling - Inquiry for Clinic >> Jun 07, 2020  1:55 PM Angela Nevin wrote: Patient would like to know if she can schedule CPE with another provider in Dr. Senaida Lange absence.

## 2020-06-09 ENCOUNTER — Encounter: Payer: Self-pay | Admitting: Family Medicine

## 2020-06-16 NOTE — Progress Notes (Signed)
Complete physical exam   Patient: Jody Perez   DOB: 1990/12/31   29 y.o. Female  MRN: [id number] Visit Date: 06/17/2020  Today's healthcare provider: Trey Sailors, PA-C   Chief Complaint  Patient presents with  . Annual Exam  I,Adriana M Pollak,acting as a scribe for Trey Sailors, PA-C.,have documented all relevant documentation on the behalf of Trey Sailors, PA-C,as directed by  Trey Sailors, PA-C while in the presence of Trey Sailors, PA-C.  Subjective    Jody Perez is a 29 y.o. female who presents today for a complete physical exam.  She reports consuming a general diet. The patient does not participate in regular exercise at present. She generally feels fairly well. She reports sleeping poorly. She does have additional problems to discuss today. Patient reports that she has breakouts on her face. HPI    Past Medical History:  Diagnosis Date  . BV (bacterial vaginosis)   . Chlamydia   . Herpes genitalis in women   . HSV infection   . Migraines   . Miscarriage   . Trichomonas   . UTI (lower urinary tract infection)    Past Surgical History:  Procedure Laterality Date  . NO PAST SURGERIES    . WISDOM TOOTH EXTRACTION     Social History   Socioeconomic History  . Marital status: Single    Spouse name: Not on file  . Number of children: 3  . Years of education: 4  . Highest education level: High school graduate  Occupational History  . Occupation: unemployed  Tobacco Use  . Smoking status: Never Smoker  . Smokeless tobacco: Never Used  Vaping Use  . Vaping Use: Never used  Substance and Sexual Activity  . Alcohol use: Not Currently    Comment: socially, occasionally  . Drug use: No  . Sexual activity: Yes    Partners: Male    Birth control/protection: Injection, Pill, Condom  Other Topics Concern  . Not on file  Social History Narrative  . Not on file   Social Determinants of Health   Financial Resource Strain:   .  Difficulty of Paying Living Expenses:   Food Insecurity:   . Worried About Programme researcher, broadcasting/film/video in the Last Year:   . Barista in the Last Year:   Transportation Needs:   . Freight forwarder (Medical):   [name] Lack of Transportation (Non-Medical):   Physical Activity:   . Days of Exercise per Week:   . Minutes of Exercise per Session:   Stress:   . Feeling of Stress :   Social Connections:   . Frequency of Communication with Friends and Family:   . Frequency of Social Gatherings with Friends and Family:   . Attends Religious Services:   . Active Member of Clubs or Organizations:   . Attends Banker Meetings:   [name] Marital Status:   Intimate Partner Violence: Not At Risk  . Fear of Current or Ex-Partner: No  . Emotionally Abused: No  . Physically Abused: No  . Sexually Abused: No   Family Status  Relation Name Status  . Mother  Alive  . Brother  Alive  . Father  Alive  . MGM  Deceased  . Brother  Alive  . Neg Hx  (Not Specified)   Family History  Problem Relation Age of Onset  . Hypertension Mother   . Cancer Mother  Pitutary  . Healthy Brother   . Hypertension Brother   . Healthy Father   . Breast cancer Maternal Grandmother   . Healthy Brother   . Colon cancer Neg Hx   . Ovarian cancer Neg Hx   . Cervical cancer Neg Hx    No Known Allergies  Patient Care Team: Erasmo Downer, MD as PCP - General (Family Medicine)   Medications: Outpatient Medications Prior to Visit  Medication Sig  . medroxyPROGESTERone (DEPO-PROVERA) 150 MG/ML injection Inject 150 mg into the muscle every 3 (three) months.  . nitrofurantoin, macrocrystal-monohydrate, (MACROBID) 100 MG capsule Take 1 capsule (100 mg total) by mouth 2 (two) times daily. X 5 days (Patient not taking: Reported on 06/17/2020)  . phenazopyridine (PYRIDIUM) 200 MG tablet Take 1 tablet (200 mg total) by mouth 3 (three) times daily as needed for pain. (Patient not taking: Reported on  06/17/2020)   No facility-administered medications prior to visit.    Review of Systems  Constitutional: Negative.   HENT: Negative.   Eyes: Negative.   Respiratory: Negative.   Cardiovascular: Negative.   Gastrointestinal: Negative.   Endocrine: Negative.   Genitourinary: Negative.   Musculoskeletal: Negative.   Skin: Positive for rash.  Allergic/Immunologic: Negative.   Neurological: Negative.   Hematological: Negative.   Psychiatric/Behavioral: Negative.       Objective    BP 124/86 (BP Location: Left Arm, Patient Position: Sitting, Cuff Size: Normal)   Pulse 70   Temp (!) 97.1 F (36.2 C) (Temporal)   Ht 5\' 5"  (1.651 m)   Wt 179 lb 3.2 oz (81.3 kg)   LMP 03/05/2020 (Approximate)   SpO2 99%   BMI 29.82 kg/m    Physical Exam Constitutional:      Appearance: Normal appearance.  HENT:     Right Ear: Tympanic membrane, ear canal and external ear normal.     Left Ear: Tympanic membrane, ear canal and external ear normal.  Cardiovascular:     Rate and Rhythm: Normal rate and regular rhythm.     Pulses: Normal pulses.     Heart sounds: Normal heart sounds.  Pulmonary:     Effort: Pulmonary effort is normal.     Breath sounds: Normal breath sounds.  Abdominal:     General: Abdomen is flat. Bowel sounds are normal.     Palpations: Abdomen is soft.  Skin:    General: Skin is warm and dry.  Neurological:     General: No focal deficit present.     Mental Status: She is alert and oriented to person, place, and time.  Psychiatric:        Mood and Affect: Mood normal.        Behavior: Behavior normal.       Last depression screening scores PHQ 2/9 Scores 06/17/2020 05/11/2020 04/07/2020  PHQ - 2 Score 0 0 0  PHQ- 9 Score 0 - -   Last fall risk screening Fall Risk  06/17/2020  Falls in the past year? 0  Number falls in past yr: 0  Injury with Fall? 0   Last Audit-C alcohol use screening Alcohol Use Disorder Test (AUDIT) 06/17/2020  1. How often do you have a  drink containing alcohol? 4  2. How many drinks containing alcohol do you have on a typical day when you are drinking? 0  3. How often do you have six or more drinks on one occasion? 0  AUDIT-C Score 4  4. How often during the last year  have you found that you were not able to stop drinking once you had started? 0  5. How often during the last year have you failed to do what was normally expected from you because of drinking? 0  6. How often during the last year have you needed a first drink in the morning to get yourself going after a heavy drinking session? 0  7. How often during the last year have you had a feeling of guilt of remorse after drinking? 0  8. How often during the last year have you been unable to remember what happened the night before because you had been drinking? 0  9. Have you or someone else been injured as a result of your drinking? 0  10. Has a relative or friend or a doctor or another health worker been concerned about your drinking or suggested you cut down? 0  Alcohol Use Disorder Identification Test Final Score (AUDIT) 4   A score of 3 or more in women, and 4 or more in men indicates increased risk for alcohol abuse, EXCEPT if all of the points are from question 1   No results found for any visits on 06/17/20.  Assessment & Plan    1. Annual physical exam   2. Acne, unspecified acne type  - adapalene (DIFFERIN) 0.1 % cream; Apply topically at bedtime.  Dispense: 45 g; Refill: 0 - Ambulatory referral to Dermatology   Routine Health Maintenance and Physical Exam  Exercise Activities and Dietary recommendations Goals   None     Immunization History  Administered Date(s) Administered  . Tdap 07/30/2013, 09/27/2016, 09/18/2017    Health Maintenance  Topic Date Due  . Hepatitis C Screening  Never done  . COVID-19 Vaccine (1) 07/03/2020 (Originally 02/11/2004)  . INFLUENZA VACCINE  06/27/2020  . PAP-Cervical Cytology Screening  11/12/2021  . PAP  SMEAR-Modifier  11/12/2021  . TETANUS/TDAP  09/19/2027  . HIV Screening  Completed    Discussed health benefits of physical activity, and encouraged her to engage in regular exercise appropriate for her age and condition.    Return in about 1 year (around 06/17/2021) for CPE.     ITrey Sailors, PA-C, have reviewed all documentation for this visit. The documentation on 06/18/20 for the exam, diagnosis, procedures, and orders are all accurate and complete.    Maryella Shivers  [facility] [phone] (phone) [phone] (fax)  [facility]

## 2020-06-17 ENCOUNTER — Other Ambulatory Visit: Payer: Self-pay

## 2020-06-17 ENCOUNTER — Encounter: Payer: Self-pay | Admitting: Physician Assistant

## 2020-06-17 ENCOUNTER — Ambulatory Visit (INDEPENDENT_AMBULATORY_CARE_PROVIDER_SITE_OTHER): Payer: Medicaid Other | Admitting: Physician Assistant

## 2020-06-17 VITALS — BP 124/86 | HR 70 | Temp 97.1°F | Ht 65.0 in | Wt 179.2 lb

## 2020-06-17 DIAGNOSIS — L709 Acne, unspecified: Secondary | ICD-10-CM

## 2020-06-17 DIAGNOSIS — Z0000 Encounter for general adult medical examination without abnormal findings: Secondary | ICD-10-CM | POA: Diagnosis not present

## 2020-06-17 MED ORDER — ADAPALENE 0.1 % EX CREA: TOPICAL_CREAM | Freq: Every day | CUTANEOUS | 0 refills | Status: DC

## 2020-06-17 NOTE — Patient Instructions (Signed)
Preventive Care 21-28 Years Old, Female Preventive care refers to visits with your health care provider and lifestyle choices that can promote health and wellness. This includes:  A yearly physical exam. This may also be called an annual well check.  Regular dental visits and eye exams.  Immunizations.  Screening for certain conditions.  Healthy lifestyle choices, such as eating a healthy diet, getting regular exercise, not using drugs or products that contain nicotine and tobacco, and limiting alcohol use. What can I expect for my preventive care visit? Physical exam Your health care provider will check your:  Height and weight. This may be used to calculate body mass index (BMI), which tells if you are at a healthy weight.  Heart rate and blood pressure.  Skin for abnormal spots. Counseling Your health care provider may ask you questions about your:  Alcohol, tobacco, and drug use.  Emotional well-being.  Home and relationship well-being.  Sexual activity.  Eating habits.  Work and work environment.  Method of birth control.  Menstrual cycle.  Pregnancy history. What immunizations do I need?  Influenza (flu) vaccine  This is recommended every year. Tetanus, diphtheria, and pertussis (Tdap) vaccine  You may need a Td booster every 10 years. Varicella (chickenpox) vaccine  You may need this if you have not been vaccinated. Human papillomavirus (HPV) vaccine  If recommended by your health care provider, you may need three doses over 6 months. Measles, mumps, and rubella (MMR) vaccine  You may need at least one dose of MMR. You may also need a second dose. Meningococcal conjugate (MenACWY) vaccine  One dose is recommended if you are age 19-21 years and a first-year college student living in a residence hall, or if you have one of several medical conditions. You may also need additional booster doses. Pneumococcal conjugate (PCV13) vaccine  You may need  this if you have certain conditions and were not previously vaccinated. Pneumococcal polysaccharide (PPSV23) vaccine  You may need one or two doses if you smoke cigarettes or if you have certain conditions. Hepatitis A vaccine  You may need this if you have certain conditions or if you travel or work in places where you may be exposed to hepatitis A. Hepatitis B vaccine  You may need this if you have certain conditions or if you travel or work in places where you may be exposed to hepatitis B. Haemophilus influenzae type b (Hib) vaccine  You may need this if you have certain conditions. You may receive vaccines as individual doses or as more than one vaccine together in one shot (combination vaccines). Talk with your health care provider about the risks and benefits of combination vaccines. What tests do I need?  Blood tests  Lipid and cholesterol levels. These may be checked every 5 years starting at age 20.  Hepatitis C test.  Hepatitis B test. Screening  Diabetes screening. This is done by checking your blood sugar (glucose) after you have not eaten for a while (fasting).  Sexually transmitted disease (STD) testing.  BRCA-related cancer screening. This may be done if you have a family history of breast, ovarian, tubal, or peritoneal cancers.  Pelvic exam and Pap test. This may be done every 3 years starting at age 21. Starting at age 30, this may be done every 5 years if you have a Pap test in combination with an HPV test. Talk with your health care provider about your test results, treatment options, and if necessary, the need for more tests.   Follow these instructions at home: Eating and drinking   Eat a diet that includes fresh fruits and vegetables, whole grains, lean protein, and low-fat dairy.  Take vitamin and mineral supplements as recommended by your health care provider.  Do not drink alcohol if: ? Your health care provider tells you not to drink. ? You are  pregnant, may be pregnant, or are planning to become pregnant.  If you drink alcohol: ? Limit how much you have to 0-1 drink a day. ? Be aware of how much alcohol is in your drink. In the [place] one drink equals one 12 oz bottle of beer (355 mL), one 5 oz glass of wine (148 mL), or one 1 oz glass of hard liquor (44 mL). Lifestyle  Take daily care of your teeth and gums.  Stay active. Exercise for at least 30 minutes on 5 or more days each week.  Do not use any products that contain nicotine or tobacco, such as cigarettes, e-cigarettes, and chewing tobacco. If you need help quitting, ask your health care provider.  If you are sexually active, practice safe sex. Use a condom or other form of birth control (contraception) in order to prevent pregnancy and STIs (sexually transmitted infections). If you plan to become pregnant, see your health care provider for a preconception visit. What's next?  Visit your health care provider once a year for a well check visit.  Ask your health care provider how often you should have your eyes and teeth checked.  Stay up to date on all vaccines. This information is not intended to replace advice given to you by your health care provider. Make sure you discuss any questions you have with your health care provider. Document Revised: 07/25/2018 Document Reviewed: 07/25/2018 Elsevier Patient Education  2020 [employer].

## 2020-06-27 DIAGNOSIS — Z419 Encounter for procedure for purposes other than remedying health state, unspecified: Secondary | ICD-10-CM | POA: Diagnosis not present

## 2020-06-30 ENCOUNTER — Ambulatory Visit
Admission: EM | Admit: 2020-06-30 | Discharge: 2020-06-30 | Disposition: A | Discharge status: Home / Self Care | Payer: Medicaid Other | Attending: Internal Medicine | Admitting: Internal Medicine

## 2020-06-30 ENCOUNTER — Other Ambulatory Visit: Payer: Self-pay

## 2020-06-30 DIAGNOSIS — N3001 Acute cystitis with hematuria: Secondary | ICD-10-CM | POA: Diagnosis not present

## 2020-06-30 LAB — URINALYSIS, COMPLETE (UACMP) WITH MICROSCOPIC
Bilirubin Urine: NEGATIVE
Glucose, UA: NEGATIVE mg/dL
Ketones, ur: NEGATIVE mg/dL
Nitrite: NEGATIVE
Protein, ur: 30 mg/dL — AB
Specific Gravity, Urine: 1.025 (ref 1.005–1.030)
pH: 6 (ref 5.0–8.0)

## 2020-06-30 MED ORDER — NITROFURANTOIN MONOHYD MACRO 100 MG PO CAPS
100.0000 mg | ORAL_CAPSULE | Freq: Two times a day (BID) | ORAL | 0 refills | Status: DC
Start: 2020-06-30 — End: 2020-08-13

## 2020-06-30 NOTE — ED Triage Notes (Signed)
Patient complains of burning with urination, frequency and urgency since earlier today.

## 2020-07-02 ENCOUNTER — Telehealth (HOSPITAL_COMMUNITY): Payer: Self-pay

## 2020-07-02 LAB — URINE CULTURE: Culture: 40000 — AB

## 2020-07-02 NOTE — ED Provider Notes (Signed)
[facility]    CSN: [id number] Arrival date & time: 06/30/20  1904      History   Chief Complaint Chief Complaint  Patient presents with  . Dysuria    HPI Jody Perez is a 29 y.o. female comes to the urgent care with complains of dysuria, urgency and frequency.  Patient symptoms started today. She denies any flank pain, nausea, vomiting, fever or chills. No vaginal discharge. Patient is sexually active. No deep dyspareunia. Past Medical History:  Diagnosis Date  . BV (bacterial vaginosis)   . Chlamydia   . Herpes genitalis in women   . HSV infection   . Migraines   . Miscarriage   . Trichomonas   . UTI (lower urinary tract infection)     Patient Active Problem List   Diagnosis Date Noted  . Obesity BMI=30.1 04/07/2020  . Recurrent vaginitis 06/06/2019  . Anemia 03/27/2018  . History of genital HSV 01/28/2013    Past Surgical History:  Procedure Laterality Date  . NO PAST SURGERIES    . WISDOM TOOTH EXTRACTION      OB History    Gravida  6   Para  3   Term  3   Preterm  0   AB  2   Living  3     SAB  2   TAB  0   Ectopic  0   Multiple  0   Live Births  3            Home Medications    Prior to Admission medications   Medication Sig Start Date End Date Taking? Authorizing Provider  adapalene (DIFFERIN) 0.1 % cream Apply topically at bedtime. 06/17/20  Yes Trey Sailors, PA-C  medroxyPROGESTERone (DEPO-PROVERA) 150 MG/ML injection Inject 150 mg into the muscle every 3 (three) months.   Yes [provider]  nitrofurantoin, macrocrystal-monohydrate, (MACROBID) 100 MG capsule Take 1 capsule (100 mg total) by mouth 2 (two) times daily. X 5 days 06/30/20   Merrilee Jansky, MD    Family History Family History  Problem Relation Age of Onset  . Hypertension Mother   . Cancer Mother        Pitutary  . Healthy Brother   . Hypertension Brother   . Healthy Father   . Breast cancer Maternal Grandmother   . Healthy  Brother   . Colon cancer Neg Hx   . Ovarian cancer Neg Hx   . Cervical cancer Neg Hx     Social History Social History   Tobacco Use  . Smoking status: Never Smoker  . Smokeless tobacco: Never Used  Vaping Use  . Vaping Use: Never used  Substance Use Topics  . Alcohol use: Not Currently    Comment: socially, occasionally  . Drug use: No     Allergies   Patient has no known allergies.   Review of Systems Review of Systems  Respiratory: Negative.   Genitourinary: Positive for dysuria and urgency. Negative for dyspareunia, flank pain, genital sores, hematuria, pelvic pain and vaginal discharge.  Musculoskeletal: Negative.      Physical Exam Triage Vital Signs ED Triage Vitals [06/30/20 1944]  Enc Vitals Group     BP      Pulse      Resp      Temp      Temp src      SpO2      Weight 180 lb (81.6 kg)     Height 5'  5" (1.651 m)     Head Circumference      Peak Flow      Pain Score 8     Pain Loc      Pain Edu?      Excl. in GC?    No data found.  Updated Vital Signs Ht 5\' 5"  (1.651 m)   Wt 81.6 kg   LMP 03/05/2020 (Approximate)   BMI 29.95 kg/m   Visual Acuity Right Eye Distance:   Left Eye Distance:   Bilateral Distance:    Right Eye Near:   Left Eye Near:    Bilateral Near:     Physical Exam Vitals and nursing note reviewed.  Constitutional:      Appearance: Normal appearance.  Cardiovascular:     Rate and Rhythm: Normal rate and regular rhythm.     Pulses: Normal pulses.     Heart sounds: Normal heart sounds.  Pulmonary:     Effort: Pulmonary effort is normal.     Breath sounds: Normal breath sounds.  Abdominal:     General: Bowel sounds are normal.     Tenderness: There is no abdominal tenderness. There is no guarding or rebound.  Musculoskeletal:     Cervical back: Normal range of motion. No tenderness.  Lymphadenopathy:     Cervical: No cervical adenopathy.  Skin:    Capillary Refill: Capillary refill takes less than 2 seconds.    Neurological:     Mental Status: She is alert.      UC Treatments / Results  Labs (all labs ordered are listed, but only abnormal results are displayed) Labs Reviewed  URINE CULTURE - Abnormal; Notable for the following components:      Result Value   Culture 40,000 COLONIES/mL ESCHERICHIA COLI (*)    Organism ID, Bacteria ESCHERICHIA COLI (*)    All other components within normal limits  URINALYSIS, COMPLETE (UACMP) WITH MICROSCOPIC - Abnormal; Notable for the following components:   APPearance HAZY (*)    Hgb urine dipstick TRACE (*)    Protein, ur 30 (*)    Leukocytes,Ua TRACE (*)    Bacteria, UA FEW (*)    All other components within normal limits    EKG   Radiology No results found.  Procedures Procedures (including critical care time)  Medications Ordered in UC Medications - No data to display  Initial Impression / Assessment and Plan / UC Course  I have reviewed the triage vital signs and the nursing notes.  Pertinent labs & imaging results that were available during my care of the patient were reviewed by me and considered in my medical decision making (see chart for details).     1. Acute cystitis with hematuria: Urinalysis is remarkable for hemoglobin, leukocyte esterase, WBC of 11-20/high-power field Urine cultures have been sent Nitrofurantoin 100 mg twice daily for 5 days If urine cultures require change in antibiotics we will call patient and make appropriate changes. If patient symptoms worsens she is advised to return to urgent care to be reevaluated. Final Clinical Impressions(s) / UC Diagnoses   Final diagnoses:  Acute cystitis with hematuria   Discharge Instructions   None    ED Prescriptions    Medication Sig Dispense Auth. Provider   nitrofurantoin, macrocrystal-monohydrate, (MACROBID) 100 MG capsule Take 1 capsule (100 mg total) by mouth 2 (two) times daily. X 5 days 10 capsule Jakaree Pickard, 05/05/2020, MD     PDMP not reviewed this  encounter.   Britta Mccreedy,  MD 07/02/20 1353

## 2020-07-22 ENCOUNTER — Ambulatory Visit: Payer: Medicaid Other | Admitting: Family Medicine

## 2020-07-22 ENCOUNTER — Other Ambulatory Visit: Payer: Self-pay

## 2020-07-22 DIAGNOSIS — Z539 Procedure and treatment not carried out, unspecified reason: Secondary | ICD-10-CM

## 2020-07-22 DIAGNOSIS — Z113 Encounter for screening for infections with a predominantly sexual mode of transmission: Secondary | ICD-10-CM

## 2020-07-22 NOTE — Progress Notes (Signed)
Patient was here in clinic for depo, depo is at 10 w2 days.  Patient reports that she many have a yeast infection, but started using yeast cream on last pm.  Patient reports more concerned about depo than yeast.  Patient inform of unable to assess d/t use of cream and results are more accurate when no traetments have been used.  Patient counseled on recommended depo date.  Patient rescheduled for 07/30/20 for FP problem visit for depo and STI screen if necessary at that time.   Wendi Snipes, RN

## 2020-07-28 DIAGNOSIS — Z419 Encounter for procedure for purposes other than remedying health state, unspecified: Secondary | ICD-10-CM | POA: Diagnosis not present

## 2020-07-30 ENCOUNTER — Other Ambulatory Visit: Payer: Self-pay

## 2020-07-30 ENCOUNTER — Encounter: Payer: Self-pay | Admitting: Advanced Practice Midwife

## 2020-07-30 ENCOUNTER — Ambulatory Visit (LOCAL_COMMUNITY_HEALTH_CENTER): Payer: Medicaid Other | Admitting: Advanced Practice Midwife

## 2020-07-30 VITALS — BP 117/75 | Ht 64.0 in | Wt 177.2 lb

## 2020-07-30 DIAGNOSIS — Z30013 Encounter for initial prescription of injectable contraceptive: Secondary | ICD-10-CM

## 2020-07-30 DIAGNOSIS — Z3009 Encounter for other general counseling and advice on contraception: Secondary | ICD-10-CM | POA: Diagnosis not present

## 2020-07-30 DIAGNOSIS — Z113 Encounter for screening for infections with a predominantly sexual mode of transmission: Secondary | ICD-10-CM | POA: Diagnosis not present

## 2020-07-30 DIAGNOSIS — Z3042 Encounter for surveillance of injectable contraceptive: Secondary | ICD-10-CM

## 2020-07-30 DIAGNOSIS — Z720 Tobacco use: Secondary | ICD-10-CM

## 2020-07-30 LAB — WET PREP FOR TRICH, YEAST, CLUE
Trichomonas Exam: NEGATIVE
Yeast Exam: NEGATIVE

## 2020-07-30 MED ORDER — MEDROXYPROGESTERONE ACETATE 150 MG/ML IM SUSP
150.0000 mg | Freq: Once | INTRAMUSCULAR | Status: AC
  Administered 2020-07-30: 150 mg via INTRAMUSCULAR

## 2020-07-30 NOTE — Progress Notes (Signed)
[facility] Department STI clinic/screening visit  Subjective:  Jody Perez is a 29 y.o. SBF vaper female G6P3 being seen today for an STI screening visit. The patient reports they do not have symptoms.  Patient reports that they do not desire a pregnancy in the next year.   They reported they are not interested in discussing contraception today.  Patient's last menstrual period was 03/05/2020 (approximate).   Patient has the following medical conditions:   Patient Active Problem List   Diagnosis Date Noted   Obesity BMI=30.1 04/07/2020   Recurrent vaginitis 06/06/2019   Anemia 03/27/2018   History of genital HSV 01/28/2013    Chief Complaint  Patient presents with   Contraception    Depo   Exposure to STD    HPI  Patient reports here for DMPA and wants STD check as well.  Vapes and smokes Black & Milds.  Last ETOH 07/24/20 (3 wine coolers)--2x/mo.  Diagnosed with depression age 46.  Last sex 07/27/20 without condom; with current  Partner x 1.5 years.  Last PE 05/11/20.  LMP April 2021.  Last HIV test per patient/review of record was 01/01/20 Patient reports last pap was 11/12/18 Neg   See flowsheet for further details and programmatic requirements.    The following portions of the patient's history were reviewed and updated as appropriate: allergies, current medications, past medical history, past social history, past surgical history and problem list.  Objective:   Vitals:   07/30/20 0828  BP: 117/75  Weight: 177 lb 3.2 oz (80.4 kg)  Height: 5\' 4"  (1.626 m)    Physical Exam Vitals and nursing note reviewed.  Constitutional:      Appearance: Normal appearance. She is obese.  HENT:     Head: Normocephalic and atraumatic.     Mouth/Throat:     Mouth: Mucous membranes are moist.     Pharynx: Oropharynx is clear. No oropharyngeal exudate or posterior oropharyngeal erythema.  Eyes:     Conjunctiva/sclera: Conjunctivae normal.  Pulmonary:     Effort:  Pulmonary effort is normal.  Abdominal:     Palpations: Abdomen is soft. There is no mass.     Tenderness: There is no abdominal tenderness. There is no rebound.     Comments: Poor tone, soft without tenderness, increased adipose  Genitourinary:    General: Normal vulva.     Exam position: Lithotomy position.     Pubic Area: No rash or pubic lice.      Labia:        Right: No rash or lesion.        Left: No rash or lesion.      Vagina: Normal. No vaginal discharge (thick white curdy leukorrhea, ph<4.5), erythema, bleeding or lesions.     Cervix: Normal.     Uterus: Normal.      Adnexa: Right adnexa normal and left adnexa normal.     Rectum: Normal.  Lymphadenopathy:     Head:     Right side of head: No preauricular or posterior auricular adenopathy.     Left side of head: No preauricular or posterior auricular adenopathy.     Cervical: No cervical adenopathy.     Upper Body:     Right upper body: No supraclavicular or axillary adenopathy.     Left upper body: No supraclavicular or axillary adenopathy.     Lower Body: No right inguinal adenopathy. No left inguinal adenopathy.  Skin:    General: Skin is warm and dry.  Findings: No rash.  Neurological:     Mental Status: She is alert and oriented to person, place, and time.      Assessment and Plan:  [name] is a 29 y.o. female presenting to the [facility] Department for STI screening  1. Screening examination for venereal disease Treat wet mount per standing orders Immunization nurse consult - WET PREP FOR TRICH, YEAST, CLUE - Gonococcus culture - Chlamydia/Gonorrhea [place]  2. Family planning May have DMPA 150 mg IM per standing orders on 05/11/20  3. Encounter for surveillance of injectable contraceptive      No follow-ups on file.  Future Appointments  Date Time Provider Department Center  10/14/2020 11:30 AM Deirdre Evener, MD [facility] None  06/17/2021  9:00 AM Bacigalupo, Marzella Schlein, MD [facility]    Alberteen Spindle, CNM

## 2020-07-30 NOTE — Progress Notes (Signed)
Attestation of Attending Supervision of Enhanced Role Nurse:  At public health departments, Enhanced Role Nurses work under standing orders and procedures to provide STI related screening services.. I agree with the care provided to this patient and was available for any consultation as needed.  I have reviewed the RN's note and chart.  I was not consulted by the RN for the patient. If consulted documentation of consult is reflected in the RN's documentation.  ° °Shandelle Borrelli Niles Maahir Horst, MD, MPH, ABFM °Medical Director  °[facility].  ° °

## 2020-07-30 NOTE — Progress Notes (Signed)
Wet mount reviewed, no treatment indicated. Depo given, tolerated well, next Depo card given.Burt Knack, RN

## 2020-07-30 NOTE — Progress Notes (Signed)
Patient here for Depo at 11 3/7 since last Depo. Desires STD screening. PE 05/11/20, order for Depo x 1 year written that date by C. Kizzie Ide. Burt Knack, RN

## 2020-08-03 LAB — GONOCOCCUS CULTURE

## 2020-08-13 ENCOUNTER — Ambulatory Visit
Admission: EM | Admit: 2020-08-13 | Discharge: 2020-08-13 | Disposition: A | Discharge status: Home / Self Care | Payer: Medicaid Other | Attending: Internal Medicine | Admitting: Internal Medicine

## 2020-08-13 ENCOUNTER — Encounter: Payer: Self-pay | Admitting: Emergency Medicine

## 2020-08-13 ENCOUNTER — Other Ambulatory Visit: Payer: Self-pay

## 2020-08-13 DIAGNOSIS — N3000 Acute cystitis without hematuria: Secondary | ICD-10-CM

## 2020-08-13 LAB — URINALYSIS, COMPLETE (UACMP) WITH MICROSCOPIC
Bilirubin Urine: NEGATIVE
Glucose, UA: NEGATIVE mg/dL
Nitrite: NEGATIVE
Protein, ur: 100 mg/dL — AB
Specific Gravity, Urine: >1.03 — ABNORMAL HIGH (ref 1.005–1.030)
WBC, UA: >50 WBC/hpf (ref 0–5)
pH: 7 (ref 5.0–8.0)

## 2020-08-13 LAB — WET PREP, GENITAL
Clue Cells Wet Prep HPF POC: NONE SEEN
Sperm: NONE SEEN
Trich, Wet Prep: NONE SEEN
Yeast Wet Prep HPF POC: NONE SEEN

## 2020-08-13 MED ORDER — SULFAMETHOXAZOLE-TRIMETHOPRIM 800-160 MG PO TABS
1.0000 | ORAL_TABLET | Freq: Two times a day (BID) | ORAL | 0 refills | Status: AC
Start: 2020-08-13 — End: 2020-08-20

## 2020-08-13 MED ORDER — PHENAZOPYRIDINE HCL 200 MG PO TABS
200.0000 mg | ORAL_TABLET | Freq: Three times a day (TID) | ORAL | 0 refills | Status: DC
Start: 2020-08-13 — End: 2020-10-02

## 2020-08-13 MED ORDER — FLUCONAZOLE 150 MG PO TABS
150.0000 mg | ORAL_TABLET | Freq: Every day | ORAL | 0 refills | Status: DC
Start: 2020-08-13 — End: 2020-10-02

## 2020-08-13 NOTE — ED Triage Notes (Signed)
Patient c/o urinary urgency and burning when urinating that started this morning.

## 2020-08-13 NOTE — ED Provider Notes (Signed)
[facility]    CSN: [id number] Arrival date & time: 08/13/20  1917      History   Chief Complaint Chief Complaint  Patient presents with  . Dysuria    HPI Jody Perez is a 29 y.o. female who presents with onset of dysuria and frequency this am. Wants vaginal swab done as well since she had abnormal discharge 2 days ago, but is fine now. She does not get periods since she is on depo shot.   Past Medical History:  Diagnosis Date  . BV (bacterial vaginosis)   . Chlamydia   . Herpes genitalis in women   . HSV infection   . Migraines   . Miscarriage   . Trichomonas   . UTI (lower urinary tract infection)     Patient Active Problem List   Diagnosis Date Noted  . Vapes nicotine containing substance 07/30/2020  . Obesity BMI=30.4 04/07/2020  . Recurrent vaginitis 06/06/2019  . Anemia 03/27/2018  . History of genital HSV 01/28/2013    Past Surgical History:  Procedure Laterality Date  . NO PAST SURGERIES    . WISDOM TOOTH EXTRACTION      OB History    Gravida  6   Para  3   Term  3   Preterm  0   AB  2   Living  3     SAB  2   TAB  0   Ectopic  0   Multiple  0   Live Births  3            Home Medications    Prior to Admission medications   Medication Sig Start Date End Date Taking? Authorizing Provider  medroxyPROGESTERone (DEPO-PROVERA) 150 MG/ML injection Inject 150 mg into the muscle every 3 (three) months.   Yes [provider]  fluconazole (DIFLUCAN) 150 MG tablet Take 1 tablet (150 mg total) by mouth daily. 08/13/20   Rodriguez-Southworth, Nettie Elm, PA-C  phenazopyridine (PYRIDIUM) 200 MG tablet Take 1 tablet (200 mg total) by mouth 3 (three) times daily. 08/13/20   Rodriguez-Southworth, Nettie Elm, PA-C  sulfamethoxazole-trimethoprim (BACTRIM DS) 800-160 MG tablet Take 1 tablet by mouth 2 (two) times daily for 7 days. 08/13/20 08/20/20  Rodriguez-Southworth, Nettie Elm, PA-C    Family History Family History  Problem  Relation Age of Onset  . Hypertension Mother   . Cancer Mother        Pitutary  . Healthy Brother   . Hypertension Brother   . Healthy Father   . Breast cancer Maternal Grandmother   . Healthy Brother   . Colon cancer Neg Hx   . Ovarian cancer Neg Hx   . Cervical cancer Neg Hx     Social History Social History   Tobacco Use  . Smoking status: Never Smoker  . Smokeless tobacco: Never Used  Vaping Use  . Vaping Use: Never used  Substance Use Topics  . Alcohol use: Not Currently    Comment: socially, occasionally  . Drug use: No     Allergies   Patient has no known allergies.   Review of Systems Review of Systems  Constitutional: Negative for chills, diaphoresis and fever.  Gastrointestinal: Negative for abdominal pain, nausea and vomiting.  Genitourinary: Positive for dysuria and frequency. Negative for difficulty urinating, flank pain, genital sores, pelvic pain and vaginal discharge.  Skin: Negative for rash.   Physical Exam Triage Vital Signs ED Triage Vitals  Enc Vitals Group     BP 08/13/20 1925  126/90     Pulse Rate 08/13/20 1925 81     Resp 08/13/20 1925 14     Temp 08/13/20 1925 98.2 F (36.8 C)     Temp Source 08/13/20 1925 Oral     SpO2 08/13/20 1925 100 %     Weight 08/13/20 1923 180 lb (81.6 kg)     Height 08/13/20 1923 5\' 4"  (1.626 m)     Head Circumference --      Peak Flow --      Pain Score 08/13/20 1923 5     Pain Loc --      Pain Edu? --      Excl. in GC? --    No data found.  Updated Vital Signs BP 126/90 (BP Location: Left Arm)   Pulse 81   Temp 98.2 F (36.8 C) (Oral)   Resp 14   Ht 5\' 4"  (1.626 m)   Wt 180 lb (81.6 kg)   LMP 03/05/2020 (Approximate)   SpO2 100%   BMI 30.90 kg/m   Visual Acuity Right Eye Distance:   Left Eye Distance:   Bilateral Distance:    Right Eye Near:   Left Eye Near:    Bilateral Near:     Physical Exam Physical Exam Vitals and nursing note reviewed.  Constitutional:      General: She  is not in acute distress.    Appearance: She is not toxic-appearing.  HENT:     Head: Normocephalic.     Right Ear: External ear normal.     Left Ear: External ear normal.  Eyes:     General: No scleral icterus.    Conjunctiva/sclera: Conjunctivae normal.  Pulmonary:     Effort: Pulmonary effort is normal.  Abdominal:     General: Bowel sounds are normal.     Palpations: Abdomen is soft. There is no mass.     Tenderness: There is no guarding or rebound.     Comments: - CVA tenderness   Musculoskeletal:        General: Normal range of motion.     Cervical back: Neck supple.   Skin:    General: Skin is warm and dry.     Findings: No rash.  Neurological:     Mental Status: She is alert and oriented to person, place, and time.     Gait: Gait normal.  Psychiatric:        Mood and Affect: Mood normal.        Behavior: Behavior normal.        Thought Content: Thought content normal.        Judgment: Judgment normal.    UC Treatments / Results  Labs (all labs ordered are listed, but only abnormal results are displayed) Labs Reviewed  WET PREP, GENITAL - Abnormal; Notable for the following components:      Result Value   WBC, Wet Prep HPF POC FEW (*)    All other components within normal limits  URINALYSIS, COMPLETE (UACMP) WITH MICROSCOPIC - Abnormal; Notable for the following components:   APPearance CLOUDY (*)    Specific Gravity, Urine >1.030 (*)    Hgb urine dipstick MODERATE (*)    Ketones, ur TRACE (*)    Protein, ur 100 (*)    Leukocytes,Ua MODERATE (*)    Bacteria, UA FEW (*)    All other components within normal limits  URINE CULTURE    EKG   Radiology No results found.  Procedures Procedures (including critical  care time)  Medications Ordered in UC Medications - No data to display  Initial Impression / Assessment and Plan / UC Course  I have reviewed the triage vital signs and the nursing notes. Pertinent labs  results that were available during my  care of the patient were reviewed by me and considered in my medical decision making (see chart for details). Her urine is positive and I sent it for a culture.  She was placed on Bactrim, Pyridium and Diflucan( as noted) in case she gets yeast from the antibiotic   Final Clinical Impressions(s) / UC Diagnoses   Final diagnoses:  Acute cystitis without hematuria     Discharge Instructions     Your vaginal swab is negative Your urine shows infection and I am sending it for a culture. If we need to change the antibiotic we will call you.     ED Prescriptions    Medication Sig Dispense Auth. Provider   sulfamethoxazole-trimethoprim (BACTRIM DS) 800-160 MG tablet Take 1 tablet by mouth 2 (two) times daily for 7 days. 14 tablet Rodriguez-Southworth, Nettie Elm, PA-C   phenazopyridine (PYRIDIUM) 200 MG tablet Take 1 tablet (200 mg total) by mouth 3 (three) times daily. 6 tablet Rodriguez-Southworth, Nettie Elm, PA-C   fluconazole (DIFLUCAN) 150 MG tablet Take 1 tablet (150 mg total) by mouth daily. 2 tablet Rodriguez-Southworth, Nettie Elm, PA-C     PDMP not reviewed this encounter.   Garey Ham, Cordelia Poche 08/13/20 2004

## 2020-08-13 NOTE — Discharge Instructions (Addendum)
Your vaginal swab is negative Your urine shows infection and I am sending it for a culture. If we need to change the antibiotic we will call you.

## 2020-08-16 LAB — URINE CULTURE: Culture: >=100000 — AB

## 2020-08-27 DIAGNOSIS — Z419 Encounter for procedure for purposes other than remedying health state, unspecified: Secondary | ICD-10-CM | POA: Diagnosis not present

## 2020-09-01 ENCOUNTER — Other Ambulatory Visit: Payer: Self-pay

## 2020-09-01 ENCOUNTER — Ambulatory Visit: Payer: Medicaid Other | Admitting: Physician Assistant

## 2020-09-01 DIAGNOSIS — Z113 Encounter for screening for infections with a predominantly sexual mode of transmission: Secondary | ICD-10-CM

## 2020-09-02 LAB — WET PREP FOR TRICH, YEAST, CLUE
Trichomonas Exam: NEGATIVE
Yeast Exam: NEGATIVE

## 2020-09-03 ENCOUNTER — Encounter: Payer: Self-pay | Admitting: Physician Assistant

## 2020-09-03 NOTE — Progress Notes (Signed)
[facility] Department STI clinic/screening visit  Subjective:  Jody Perez is a 29 y.o. female being seen today for an STI screening visit. The patient reports they do have symptoms.  Patient reports that they do not desire a pregnancy in the next year.   They reported they are not interested in discussing contraception today.  Patient's last menstrual period was 03/05/2020 (approximate).   Patient has the following medical conditions:   Patient Active Problem List   Diagnosis Date Noted  . Vapes nicotine containing substance 07/30/2020  . Obesity BMI=30.4 04/07/2020  . Recurrent vaginitis 06/06/2019  . Anemia 03/27/2018  . History of genital HSV 01/28/2013    Chief Complaint  Patient presents with  . SEXUALLY TRANSMITTED DISEASE    screening     HPI  Patient reports that she has had a vaginal odor for 2 days.  Denies other symptoms, chronic conditions and surgeries.  Reports last HIV test was in 2020 and last pap was in 2019.  Using Depo as her BCM.  See flowsheet for further details and programmatic requirements.    The following portions of the patient's history were reviewed and updated as appropriate: allergies, current medications, past medical history, past social history, past surgical history and problem list.  Objective:  There were no vitals filed for this visit.  Physical Exam Constitutional:      General: She is not in acute distress.    Appearance: Normal appearance.  HENT:     Head: Normocephalic and atraumatic.     Comments: No nits,lice, or hair loss. No cervical, supraclavicular or axillary adenopathy.    Mouth/Throat:     Mouth: Mucous membranes are moist.     Pharynx: Oropharynx is clear. No oropharyngeal exudate or posterior oropharyngeal erythema.  Eyes:     Conjunctiva/sclera: Conjunctivae normal.  Pulmonary:     Effort: Pulmonary effort is normal.  Abdominal:     Palpations: Abdomen is soft. There is no mass.     Tenderness:  There is no abdominal tenderness. There is no guarding or rebound.  Genitourinary:    General: Normal vulva.     Rectum: Normal.     Comments: External genitalia/pubic area without nits, lice, edema, erythema, lesions and inguinal adenopathy. Vagina with normal mucosa and discharge. Cervix without visible lesions. Uterus firm, mobile, nt, no masses, no CMT, no adnexal tenderness or fullness. Musculoskeletal:     Cervical back: Neck supple. No tenderness.  Skin:    General: Skin is warm and dry.     Findings: No bruising, erythema, lesion or rash.  Neurological:     Mental Status: She is alert and oriented to person, place, and time.  Psychiatric:        Mood and Affect: Mood normal.        Thought Content: Thought content normal.        Judgment: Judgment normal.      Assessment and Plan:  Jody Perez is a 29 y.o. female presenting to the [facility] Department for STI screening  1. Screening for STD (sexually transmitted disease) Patient into clinic with symptoms. Reviewed with patient wet mount and exam findings and no treatment indicated today. Rec condoms with all sex. Await test results.  Counseled that RN will call if needs to RTC for treatment once results are back. - WET PREP FOR TRICH, YEAST, CLUE - Gonococcus culture - Chlamydia/Gonorrhea [place] [facility] - HIV [place] [facility] - Syphilis Serology,  Lab - Gonococcus  culture     No follow-ups on file.  Future Appointments  Date Time Provider Department Center  10/14/2020 11:30 AM Deirdre Evener, MD [facility] None  06/17/2021  9:00 AM Bacigalupo, Marzella Schlein, MD [facility]    Matt Holmes, [place]

## 2020-09-06 LAB — GONOCOCCUS CULTURE

## 2020-09-19 ENCOUNTER — Ambulatory Visit (HOSPITAL_COMMUNITY)
Admission: EM | Admit: 2020-09-19 | Discharge: 2020-09-19 | Disposition: A | Discharge status: Home / Self Care | Payer: Medicaid Other | Attending: Family Medicine | Admitting: Family Medicine

## 2020-09-19 ENCOUNTER — Other Ambulatory Visit: Payer: Self-pay

## 2020-09-19 ENCOUNTER — Encounter (HOSPITAL_COMMUNITY): Payer: Self-pay

## 2020-09-19 DIAGNOSIS — R300 Dysuria: Secondary | ICD-10-CM | POA: Insufficient documentation

## 2020-09-19 DIAGNOSIS — N898 Other specified noninflammatory disorders of vagina: Secondary | ICD-10-CM | POA: Insufficient documentation

## 2020-09-19 DIAGNOSIS — K0889 Other specified disorders of teeth and supporting structures: Secondary | ICD-10-CM | POA: Diagnosis not present

## 2020-09-19 LAB — POCT URINALYSIS DIPSTICK, ED / UC
Bilirubin Urine: NEGATIVE
Glucose, UA: NEGATIVE mg/dL
Hgb urine dipstick: NEGATIVE
Ketones, ur: NEGATIVE mg/dL
Nitrite: NEGATIVE
Protein, ur: NEGATIVE mg/dL
Specific Gravity, Urine: 1.025 (ref 1.005–1.030)
Urobilinogen, UA: 2 mg/dL — ABNORMAL HIGH (ref 0.0–1.0)
pH: 6.5 (ref 5.0–8.0)

## 2020-09-19 LAB — POC URINE PREG, ED: Preg Test, Ur: NEGATIVE

## 2020-09-19 MED ORDER — IBUPROFEN 800 MG PO TABS: 800.0000 mg | ORAL_TABLET | Freq: Three times a day (TID) | ORAL | 0 refills | Status: DC

## 2020-09-19 MED ORDER — AMOXICILLIN 875 MG PO TABS: 875.0000 mg | ORAL_TABLET | Freq: Two times a day (BID) | ORAL | 0 refills | Status: DC

## 2020-09-19 NOTE — ED Provider Notes (Signed)
[facility]    CSN: [id number] Arrival date & time: 09/19/20  1325      History   Chief Complaint Chief Complaint  Patient presents with  . Vaginal Discharge  . Dental Pain    HPI Jody Perez is a 29 y.o. female.   HPI  Patient is here for 2 problems.  First she has dental pain.  She has been to the dentist 2 weeks ago.  She had some dental work done.  It still painful.  She has an appointment to see the dentist next week. Patient states that she also has some dysuria and vaginal irritation.  She does not think she has an STD.  She is uncertain if she has a vaginal infection or a urinary tract infection.  No frequency.  No blood in urine.  No abdominal pain.  No nausea vomiting.  No flank or back pain  Past Medical History:  Diagnosis Date  . BV (bacterial vaginosis)   . Chlamydia   . Herpes genitalis in women   . HSV infection   . Migraines   . Miscarriage   . Trichomonas   . UTI (lower urinary tract infection)     Patient Active Problem List   Diagnosis Date Noted  . Vapes nicotine containing substance 07/30/2020  . Obesity BMI=30.4 04/07/2020  . Recurrent vaginitis 06/06/2019  . Anemia 03/27/2018  . History of genital HSV 01/28/2013    Past Surgical History:  Procedure Laterality Date  . NO PAST SURGERIES    . WISDOM TOOTH EXTRACTION      OB History    Gravida  6   Para  3   Term  3   Preterm  0   AB  2   Living  3     SAB  2   TAB  0   Ectopic  0   Multiple  0   Live Births  3            Home Medications    Prior to Admission medications   Medication Sig Start Date End Date Taking? Authorizing Provider  amoxicillin (AMOXIL) 875 MG tablet Take 1 tablet (875 mg total) by mouth 2 (two) times daily. 09/19/20   Eustace Moore, MD  fluconazole (DIFLUCAN) 150 MG tablet Take 1 tablet (150 mg total) by mouth daily. 08/13/20   Rodriguez-Southworth, Nettie Elm, PA-C  ibuprofen (ADVIL) 800 MG tablet Take 1 tablet (800 mg  total) by mouth 3 (three) times daily. 09/19/20   Eustace Moore, MD  medroxyPROGESTERone (DEPO-PROVERA) 150 MG/ML injection Inject 150 mg into the muscle every 3 (three) months.    [provider]  phenazopyridine (PYRIDIUM) 200 MG tablet Take 1 tablet (200 mg total) by mouth 3 (three) times daily. 08/13/20   Rodriguez-Southworth, Nettie Elm, PA-C  valACYclovir (VALTREX) 1000 MG tablet Take 1,000 mg by mouth daily. 06/15/20   [provider]    Family History Family History  Problem Relation Age of Onset  . Hypertension Mother   . Cancer Mother        Pitutary  . Healthy Brother   . Hypertension Brother   . Healthy Father   . Breast cancer Maternal Grandmother   . Healthy Brother   . Colon cancer Neg Hx   . Ovarian cancer Neg Hx   . Cervical cancer Neg Hx     Social History Social History   Tobacco Use  . Smoking status: Current Some Day Smoker    Types:  E-cigarettes  . Smokeless tobacco: Never Used  Vaping Use  . Vaping Use: Never used  Substance Use Topics  . Alcohol use: Not Currently    Comment: socially, occasionally  . Drug use: No     Allergies   Patient has no known allergies.   Review of Systems Review of Systems See HPI  Physical Exam Triage Vital Signs ED Triage Vitals  Enc Vitals Group     BP 09/19/20 1434 125/79     Pulse Rate 09/19/20 1434 74     Resp 09/19/20 1434 16     Temp 09/19/20 1434 99 F (37.2 C)     Temp Source 09/19/20 1434 Oral     SpO2 09/19/20 1434 99 %     Weight --      Height --      Head Circumference --      Peak Flow --      Pain Score 09/19/20 1435 0     Pain Loc --      Pain Edu? --      Excl. in GC? --    No data found.  Updated Vital Signs BP 125/79 (BP Location: Right Arm)   Pulse 74   Temp 99 F (37.2 C) (Oral)   Resp 16   LMP 03/05/2020 (Approximate)   SpO2 99%     Physical Exam Constitutional:      General: She is not in acute distress.    Appearance: She is well-developed.    HENT:     Head: Normocephalic and atraumatic.     Mouth/Throat:     Comments: Dentition appear in good repair Eyes:     Conjunctiva/sclera: Conjunctivae normal.     Pupils: Pupils are equal, round, and reactive to light.  Cardiovascular:     Rate and Rhythm: Normal rate.  Pulmonary:     Effort: Pulmonary effort is normal. No respiratory distress.  Abdominal:     General: There is no distension.     Palpations: Abdomen is soft.     Tenderness: There is no right CVA tenderness or left CVA tenderness.  Musculoskeletal:        General: Normal range of motion.     Cervical back: Normal range of motion.  Lymphadenopathy:     Cervical: No cervical adenopathy.  Skin:    General: Skin is warm and dry.  Neurological:     Mental Status: She is alert.  Psychiatric:        Mood and Affect: Mood normal.        Behavior: Behavior normal.      UC Treatments / Results  Labs (all labs ordered are listed, but only abnormal results are displayed) Labs Reviewed  POCT URINALYSIS DIPSTICK, ED / UC - Abnormal; Notable for the following components:      Result Value   Urobilinogen, UA 2.0 (*)    Leukocytes,Ua TRACE (*)    All other components within normal limits  URINE CULTURE  POC URINE PREG, ED  CERVICOVAGINAL ANCILLARY ONLY    EKG   Radiology No results found.  Procedures Procedures (including critical care time)  Medications Ordered in UC Medications - No data to display  Initial Impression / Assessment and Plan / UC Course  I have reviewed the triage vital signs and the nursing notes.  Pertinent labs & imaging results that were available during my care of the patient were reviewed by me and considered in my medical decision making (see chart for  details).     We will treat the dental pain with penicillin and ibuprofen.  I would hold off on treating her vaginal symptoms pending test results. Final Clinical Impressions(s) / UC Diagnoses   Final diagnoses:  Dysuria   Vaginal discharge  Pain, dental     Discharge Instructions     I am prescribing an antibiotic for the dental pain You may take ibuprofen 3 x a day with food  You can check the results on my chart You will be called if any of the tests are positive   ED Prescriptions    Medication Sig Dispense Auth. Provider   amoxicillin (AMOXIL) 875 MG tablet Take 1 tablet (875 mg total) by mouth 2 (two) times daily. 14 tablet Eustace Moore, MD   ibuprofen (ADVIL) 800 MG tablet Take 1 tablet (800 mg total) by mouth 3 (three) times daily. 21 tablet Eustace Moore, MD     PDMP not reviewed this encounter.   Eustace Moore, MD 09/19/20 [phone]

## 2020-09-19 NOTE — Discharge Instructions (Signed)
I am prescribing an antibiotic for the dental pain You may take ibuprofen 3 x a day with food  You can check the results on my chart You will be called if any of the tests are positive

## 2020-09-19 NOTE — ED Triage Notes (Addendum)
Pt present vaginal discharge with some itching and burning.symptom started two days ago. Pt also C/O of right side dental pain. Symptoms started a week ago.

## 2020-09-20 LAB — CERVICOVAGINAL ANCILLARY ONLY
Bacterial Vaginitis (gardnerella): NEGATIVE
Candida Glabrata: NEGATIVE
Candida Vaginitis: NEGATIVE
Chlamydia: NEGATIVE
Comment: NEGATIVE
Comment: NEGATIVE
Comment: NEGATIVE
Comment: NEGATIVE
Comment: NEGATIVE
Comment: NORMAL
Neisseria Gonorrhea: NEGATIVE
Trichomonas: NEGATIVE

## 2020-09-20 LAB — URINE CULTURE: Special Requests: NORMAL

## 2020-09-27 DIAGNOSIS — Z419 Encounter for procedure for purposes other than remedying health state, unspecified: Secondary | ICD-10-CM | POA: Diagnosis not present

## 2020-10-02 ENCOUNTER — Encounter: Payer: Self-pay | Admitting: Gynecology

## 2020-10-02 ENCOUNTER — Ambulatory Visit
Admission: EM | Admit: 2020-10-02 | Discharge: 2020-10-02 | Disposition: A | Discharge status: Home / Self Care | Payer: Medicaid Other | Attending: Family Medicine | Admitting: Family Medicine

## 2020-10-02 ENCOUNTER — Other Ambulatory Visit: Payer: Self-pay

## 2020-10-02 DIAGNOSIS — R519 Headache, unspecified: Secondary | ICD-10-CM

## 2020-10-02 MED ORDER — KETOROLAC TROMETHAMINE 10 MG PO TABS: 10.0000 mg | ORAL_TABLET | Freq: Four times a day (QID) | ORAL | 0 refills | Status: DC | PRN
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
## 2020-10-02 NOTE — Discharge Instructions (Signed)
Rest.  Medication as directed.  Take care  Dr. Nashia Remus  

## 2020-10-14 ENCOUNTER — Ambulatory Visit: Payer: Medicaid Other | Admitting: Dermatology

## 2020-10-15 ENCOUNTER — Encounter: Payer: Self-pay | Admitting: Physician Assistant

## 2020-10-15 ENCOUNTER — Ambulatory Visit (LOCAL_COMMUNITY_HEALTH_CENTER): Payer: Medicaid Other | Admitting: Physician Assistant

## 2020-10-15 ENCOUNTER — Other Ambulatory Visit: Payer: Self-pay

## 2020-10-15 VITALS — BP 127/79 | Ht 65.0 in | Wt 174.5 lb

## 2020-10-15 DIAGNOSIS — Z30013 Encounter for initial prescription of injectable contraceptive: Secondary | ICD-10-CM | POA: Diagnosis not present

## 2020-10-15 DIAGNOSIS — Z3042 Encounter for surveillance of injectable contraceptive: Secondary | ICD-10-CM

## 2020-10-15 DIAGNOSIS — Z3009 Encounter for other general counseling and advice on contraception: Secondary | ICD-10-CM | POA: Diagnosis not present

## 2020-10-15 DIAGNOSIS — Z113 Encounter for screening for infections with a predominantly sexual mode of transmission: Secondary | ICD-10-CM

## 2020-10-15 LAB — WET PREP FOR TRICH, YEAST, CLUE
Trichomonas Exam: NEGATIVE
Yeast Exam: NEGATIVE

## 2020-10-15 MED ORDER — MEDROXYPROGESTERONE ACETATE 150 MG/ML IM SUSP
150.0000 mg | Freq: Once | INTRAMUSCULAR | Status: AC
  Administered 2020-10-15: 150 mg via INTRAMUSCULAR

## 2020-10-15 NOTE — Progress Notes (Signed)
Wet mount reviewed with provider and is negative today, so no treatment needed for wet mount per standing order and per provider verbal order. Pt received Depo 150mg  IM today per provider verbal order and per , FNP order from 05/11/2020 and pt tolerated well. Reminder card of when next Depo is due given to pt and pt aware to give 05/13/2020 a call around that time so she can RTC for her Depo. Provider orders completed.

## 2020-10-15 NOTE — Progress Notes (Signed)
Patient here for depo and STD screen. Declines blood work.   Harvie Heck, RN

## 2020-10-16 MED ORDER — MEDROXYPROGESTERONE ACETATE 150 MG/ML IM SUSP
150.0000 mg | INTRAMUSCULAR | Status: AC
  Administered 2021-01-03: 150 mg via INTRAMUSCULAR

## 2020-10-16 NOTE — Progress Notes (Signed)
[facility] problem visit  [facility]  Subjective:  Jody Perez is a 29 y.o. being seen today to get her Depo and requests STD screening.  Chief Complaint  Patient presents with  . Contraception    Depo and STD screeninig    HPI patient here for Depo today and requests screening for STDs due to vaginal irritation for 2-3 days.  Denies other symptoms and reports that her last Depo was in early September.  Per chart review, last Depo was 07/30/2020, so patient is 11 wk today.   Does the patient have a current or past history of drug use? No   No components found for: HCV]   Health Maintenance Due  Topic Date Due  . Hepatitis C Screening  Never done  . COVID-19 Vaccine (1) Never done  . INFLUENZA VACCINE  Never done    Review of Systems  All other systems reviewed and are negative.   The following portions of the patient's history were reviewed and updated as appropriate: allergies, current medications, past family history, past medical history, past social history, past surgical history and problem list. Problem list updated.   See flowsheet for other program required questions.  Objective:   Vitals:   10/15/20 0813  BP: 127/79  Weight: 174 lb 8 oz (79.2 kg)  Height: 5\' 5"  (1.651 m)    Physical Exam Vitals and nursing note reviewed.  Constitutional:      General: She is not in acute distress.    Appearance: Normal appearance.  HENT:     Head: Normocephalic and atraumatic.     Mouth/Throat:     Mouth: Mucous membranes are moist.     Pharynx: Oropharynx is clear. No oropharyngeal exudate or posterior oropharyngeal erythema.  Eyes:     Conjunctiva/sclera: Conjunctivae normal.  Pulmonary:     Effort: Pulmonary effort is normal.  Abdominal:     Palpations: Abdomen is soft. There is no mass.     Tenderness: There is no abdominal tenderness. There is no guarding or rebound.  Genitourinary:    General: Normal vulva.      Rectum: Normal.     Comments: External genitalia/pubic area without nits, lice, edema, erythema, lesions and inguinal adenopathy. Vagina with normal mucosa and discharge. Cervix without visible lesions. Uterus firm, mobile, nt, no masses, no CMT, no adnexal tenderness or fullness. Musculoskeletal:     Cervical back: Neck supple. No tenderness.  Skin:    General: Skin is warm and dry.     Findings: No bruising, erythema, lesion or rash.  Neurological:     Mental Status: She is alert and oriented to person, place, and time.  Psychiatric:        Mood and Affect: Mood normal.        Behavior: Behavior normal.        Thought Content: Thought content normal.        Judgment: Judgment normal.       Assessment and Plan:  Jody Perez is a 29 y.o. female presenting to the [facility] Department for a Women's Health problem visit  1. Encounter for counseling regarding contraception Reviewed with patient when to call clinic for irregular bleeding. Rec condoms with all sex for STD protection.  2. Screening for STD (sexually transmitted disease) Await test results.   Counseled that RN will call if needs to RTC for treatment once results are back. - WET PREP FOR TRICH, YEAST, CLUE - Chlamydia/Gonorrhea [facility]  3. Surveillance for Depo-Provera contraception Continue with Depo per 04/2020 order today. - medroxyPROGESTERone (DEPO-PROVERA) injection 150 mg     Return in about 11 weeks (around 12/31/2020) for Depo, annual and PRN.  Future Appointments  Date Time Provider Department Center  06/17/2021  9:00 AM Bacigalupo, Marzella Schlein, MD [facility]    Matt Holmes, [place]

## 2020-10-27 DIAGNOSIS — Z419 Encounter for procedure for purposes other than remedying health state, unspecified: Secondary | ICD-10-CM | POA: Diagnosis not present

## 2020-11-27 DIAGNOSIS — Z419 Encounter for procedure for purposes other than remedying health state, unspecified: Secondary | ICD-10-CM | POA: Diagnosis not present

## 2020-12-09 ENCOUNTER — Ambulatory Visit
Admission: EM | Admit: 2020-12-09 | Discharge: 2020-12-09 | Disposition: A | Discharge status: Home / Self Care | Payer: Medicaid Other | Attending: Physician Assistant | Admitting: Physician Assistant

## 2020-12-09 ENCOUNTER — Encounter: Payer: Self-pay | Admitting: Emergency Medicine

## 2020-12-09 ENCOUNTER — Other Ambulatory Visit: Payer: Self-pay

## 2020-12-09 DIAGNOSIS — N760 Acute vaginitis: Secondary | ICD-10-CM | POA: Insufficient documentation

## 2020-12-09 DIAGNOSIS — Z9189 Other specified personal risk factors, not elsewhere classified: Secondary | ICD-10-CM | POA: Diagnosis not present

## 2020-12-09 LAB — WET PREP, GENITAL
Sperm: NONE SEEN
Trich, Wet Prep: NONE SEEN
Yeast Wet Prep HPF POC: NONE SEEN

## 2020-12-09 LAB — URINALYSIS, COMPLETE (UACMP) WITH MICROSCOPIC
Bilirubin Urine: NEGATIVE
Glucose, UA: NEGATIVE mg/dL
Hgb urine dipstick: NEGATIVE
Leukocytes,Ua: NEGATIVE
Nitrite: NEGATIVE
Specific Gravity, Urine: 1.025 (ref 1.005–1.030)
pH: 7 (ref 5.0–8.0)

## 2020-12-09 MED ORDER — CEFTRIAXONE SODIUM 500 MG IJ SOLR
500.0000 mg | Freq: Once | INTRAMUSCULAR | Status: AC
  Administered 2020-12-09: 500 mg via INTRAMUSCULAR

## 2020-12-09 MED ORDER — METRONIDAZOLE 500 MG PO TABS: 500.0000 mg | ORAL_TABLET | Freq: Two times a day (BID) | ORAL | 0 refills | Status: DC

## 2020-12-09 MED ORDER — DOXYCYCLINE HYCLATE 100 MG PO CAPS: 100.0000 mg | ORAL_CAPSULE | Freq: Two times a day (BID) | ORAL | 0 refills | Status: AC

## 2020-12-09 NOTE — Discharge Instructions (Signed)
One of the vaginal swabs you performed shows BV infection. Take metronidazole for this. You have been treated for possible gonorrhea with ceftriaxone antibiotic in the clinic. Sent doxycycline to treat for possible chlamydia. The STI testing should be back in 24 hours or less. Practice safe sex. No sex until 1 week after you complete treatment and your partner does too.

## 2020-12-09 NOTE — ED Provider Notes (Addendum)
[facility]    CSN: [id number] Arrival date & time: 12/09/20  1440      History   Chief Complaint Chief Complaint  Patient presents with   SEXUALLY TRANSMITTED DISEASE    HPI Jody Perez is a 30 y.o. female presenting for STI testing.  Patient states that her boyfriend started complaining about painful urination recently and has been tested for STIs today.  She does not know the results.  She admits to a vague "burning sensation" in her genital area.  Denies any pain when she urinates.  Denies any vaginal discharge or odor.  Denies any lesions or itching.  No abdominal pain, back pain, pelvic pain, nausea/vomiting.  Last menstrual period was April 2021.  Patient gets the Depo-Medrol injections every 3 months.  Denies concern for pregnancy.  Does have history of HSV, but denies any active lesions.  Patient does have past medical history of recurrent vaginitis, chlamydia, trichomonas and UTIs.  She has no other complaints or concerns.  HPI  Past Medical History:  Diagnosis Date   BV (bacterial vaginosis)    Chlamydia    Herpes genitalis in women    HSV infection    Migraines    Miscarriage    Trichomonas    UTI (lower urinary tract infection)     Patient Active Problem List   Diagnosis Date Noted   Vapes nicotine containing substance 07/30/2020   Obesity BMI=30.4 04/07/2020   Recurrent vaginitis 06/06/2019   Anemia 03/27/2018   History of genital HSV 01/28/2013    Past Surgical History:  Procedure Laterality Date   WISDOM TOOTH EXTRACTION      OB History    Gravida  6   Para  3   Term  3   Preterm  0   AB  2   Living  3     SAB  2   IAB  0   Ectopic  0   Multiple  0   Live Births  3            Home Medications    Prior to Admission medications   Medication Sig Start Date End Date Taking? Authorizing Provider  doxycycline (VIBRAMYCIN) 100 MG capsule Take 1 capsule (100 mg total) by mouth 2 (two) times daily for  7 days. 12/09/20 12/16/20 Yes [name], PA-C  medroxyPROGESTERone (DEPO-PROVERA) 150 MG/ML injection Inject 150 mg into the muscle every 3 (three) months.   Yes [provider]  metroNIDAZOLE (FLAGYL) 500 MG tablet Take 1 tablet (500 mg total) by mouth 2 (two) times daily. 12/09/20  Yes Eusebio Friendly B, PA-C  ketorolac (TORADOL) 10 MG tablet Take 1 tablet (10 mg total) by mouth every 6 (six) hours as needed for moderate pain or severe pain (Headache). 10/02/20   Tommie Sams, DO  valACYclovir (VALTREX) 1000 MG tablet Take 1,000 mg by mouth daily. 06/15/20   [provider]    Family History Family History  Problem Relation Age of Onset   Hypertension Mother    Cancer Mother        Pitutary   Healthy Brother    Hypertension Brother    Healthy Father    Breast cancer Maternal Grandmother    Healthy Brother    Colon cancer Neg Hx    Ovarian cancer Neg Hx    Cervical cancer Neg Hx     Social History Social History   Tobacco Use   Smoking status: Never Smoker  Smokeless tobacco: Never Used  Vaping Use   Vaping Use: Every day   Substances: Flavoring  Substance Use Topics   Alcohol use: Not Currently    Comment: socially, occasionally   Drug use: No     Allergies   Patient has no known allergies.   Review of Systems Review of Systems  Constitutional: Negative for fatigue and fever.  Gastrointestinal: Negative for abdominal pain, nausea and vomiting.  Genitourinary: Negative for dysuria, flank pain, frequency, hematuria, urgency, vaginal bleeding, vaginal discharge and vaginal pain.       Burning sensation-vaginally  Musculoskeletal: Negative for back pain.  Skin: Negative for rash.     Physical Exam Triage Vital Signs ED Triage Vitals  Enc Vitals Group     BP 12/09/20 1544 112/74     Pulse Rate 12/09/20 1544 91     Resp 12/09/20 1544 18     Temp 12/09/20 1544 98.7 F (37.1 C)     Temp Source 12/09/20 1544 Oral     SpO2  12/09/20 1544 100 %     Weight 12/09/20 1545 180 lb (81.6 kg)     Height 12/09/20 1545 5\' 5"  (1.651 m)     Head Circumference --      Peak Flow --      Pain Score 12/09/20 1545 4     Pain Loc --      Pain Edu? --      Excl. in GC? --    No data found.  Updated Vital Signs BP 112/74 (BP Location: Left Arm)    Pulse 91    Temp 98.7 F (37.1 C) (Oral)    Resp 18    Ht 5\' 5"  (1.651 m)    Wt 180 lb (81.6 kg)    LMP 03/05/2020 (Approximate)    SpO2 100%    BMI 29.95 kg/m       Physical Exam Vitals and nursing note reviewed.  Constitutional:      General: She is not in acute distress.    Appearance: Normal appearance. She is not ill-appearing or toxic-appearing.  HENT:     Head: Normocephalic and atraumatic.  Eyes:     General: No scleral icterus.       Right eye: No discharge.        Left eye: No discharge.     Conjunctiva/sclera: Conjunctivae normal.  Cardiovascular:     Rate and Rhythm: Normal rate and regular rhythm.     Heart sounds: Normal heart sounds.  Pulmonary:     Effort: Pulmonary effort is normal. No respiratory distress.     Breath sounds: Normal breath sounds.  Abdominal:     Palpations: Abdomen is soft.     Tenderness: There is no abdominal tenderness. There is no right CVA tenderness or left CVA tenderness.  Musculoskeletal:     Cervical back: Neck supple.  Skin:    General: Skin is dry.  Neurological:     General: No focal deficit present.     Mental Status: She is alert. Mental status is at baseline.     Motor: No weakness.     Gait: Gait normal.  Psychiatric:        Mood and Affect: Mood normal.        Behavior: Behavior normal.        Thought Content: Thought content normal.      UC Treatments / Results  Labs (all labs ordered are listed, but only abnormal results are displayed) Labs  Reviewed  WET PREP, GENITAL - Abnormal; Notable for the following components:      Result Value   Clue Cells Wet Prep HPF POC PRESENT (*)    WBC, Wet Prep HPF  POC FEW (*)    All other components within normal limits  URINALYSIS, COMPLETE (UACMP) WITH MICROSCOPIC - Abnormal; Notable for the following components:   Ketones, ur TRACE (*)    Protein, ur TRACE (*)    Bacteria, UA FEW (*)    All other components within normal limits  CHLAMYDIA/NGC RT PCR [facility] ONLY)    EKG   Radiology No results found.  Procedures Procedures (including critical care time)  Medications Ordered in UC Medications  cefTRIAXone (ROCEPHIN) injection 500 mg (500 mg Intramuscular Given 12/09/20 1632)    Initial Impression / Assessment and Plan / UC Course  I have reviewed the triage vital signs and the nursing notes.  Pertinent labs & imaging results that were available during my care of the patient were reviewed by me and considered in my medical decision making (see chart for details).   Wet prep is positive for clue cells.  Treating for BV infection at this time with metronidazole.  Since patient's partner has some GU symptoms she would like to be treated for STIs.  Patient given 500 mg ceftriaxone in the clinic and sent with doxycycline 100 mg twice daily x7 days.  Advised to check MyChart for test results but we will call if any results are positive.  Discussed safe sex and advised to abstain from intercourse until both partners have completed treatment it has been a week after.  Strict return ED precautions reviewed for any worsening symptoms.  Final Clinical Impressions(s) / UC Diagnoses   Final diagnoses:  Acute vaginitis  At risk for sexually transmitted disease due to partner with genital symptoms     Discharge Instructions     One of the vaginal swabs you performed shows BV infection. Take metronidazole for this. You have been treated for possible gonorrhea with ceftriaxone antibiotic in the clinic. Sent doxycycline to treat for possible chlamydia. The STI testing should be back in 24 hours or less. Practice safe sex. No sex until 1 week  after you complete treatment and your partner does too.    ED Prescriptions    Medication Sig Dispense Auth. Provider   metroNIDAZOLE (FLAGYL) 500 MG tablet Take 1 tablet (500 mg total) by mouth 2 (two) times daily. 14 tablet Eusebio Friendly B, PA-C   doxycycline (VIBRAMYCIN) 100 MG capsule Take 1 capsule (100 mg total) by mouth 2 (two) times daily for 7 days. 14 capsule [name], PA-C     PDMP not reviewed this encounter.   [name], PA-C 12/09/20 1636    [name], PA-C 12/09/20 1636

## 2020-12-09 NOTE — ED Triage Notes (Signed)
Patient in today requesting STD testing. Patient states her boyfriend is having symptoms and went to see his doctor today. Patient states she started having a slight burning feeling yesterday.

## 2020-12-10 LAB — CHLAMYDIA/NGC RT PCR (ARMC ONLY)
Chlamydia Tr: NOT DETECTED
N gonorrhoeae: NOT DETECTED

## 2020-12-28 DIAGNOSIS — Z419 Encounter for procedure for purposes other than remedying health state, unspecified: Secondary | ICD-10-CM | POA: Diagnosis not present

## 2020-12-31 ENCOUNTER — Ambulatory Visit: Payer: Medicaid Other

## 2021-01-03 ENCOUNTER — Ambulatory Visit (LOCAL_COMMUNITY_HEALTH_CENTER): Payer: Medicaid Other

## 2021-01-03 ENCOUNTER — Other Ambulatory Visit: Payer: Self-pay

## 2021-01-03 VITALS — BP 119/81 | Ht 65.0 in | Wt 170.5 lb

## 2021-01-03 DIAGNOSIS — Z3009 Encounter for other general counseling and advice on contraception: Secondary | ICD-10-CM

## 2021-01-03 DIAGNOSIS — Z3042 Encounter for surveillance of injectable contraceptive: Secondary | ICD-10-CM

## 2021-01-03 NOTE — Progress Notes (Signed)
11 weeks 3 days post depo. Voices no concerns. Depo given today (Left Deltoid) per order by C. Kizzie Ide, FNP dated 05/11/2020. Tolerated well. Next depo due 03/21/2021, pt aware. Jerel Shepherd, RN

## 2021-01-11 ENCOUNTER — Encounter: Payer: Self-pay | Admitting: Family Medicine

## 2021-01-11 ENCOUNTER — Other Ambulatory Visit: Payer: Self-pay

## 2021-01-11 ENCOUNTER — Ambulatory Visit: Payer: Medicaid Other | Admitting: Family Medicine

## 2021-01-11 VITALS — BP 132/66 | HR 80 | Temp 98.1°F | Wt 174.0 lb

## 2021-01-11 DIAGNOSIS — G43009 Migraine without aura, not intractable, without status migrainosus: Secondary | ICD-10-CM | POA: Diagnosis not present

## 2021-01-11 DIAGNOSIS — Z862 Personal history of diseases of the blood and blood-forming organs and certain disorders involving the immune mechanism: Secondary | ICD-10-CM | POA: Diagnosis not present

## 2021-01-11 MED ORDER — PROMETHAZINE HCL 12.5 MG PO TABS: 12.5000 mg | ORAL_TABLET | Freq: Three times a day (TID) | ORAL | 0 refills | Status: DC | PRN

## 2021-01-11 MED ORDER — SUMATRIPTAN SUCCINATE 50 MG PO TABS: 50.0000 mg | ORAL_TABLET | ORAL | 11 refills | Status: DC | PRN

## 2021-01-11 NOTE — Patient Instructions (Signed)

## 2021-01-11 NOTE — Progress Notes (Signed)
Acute Office Visit  Subjective:    Patient ID: Jody Perez, female    DOB: 1991/03/06, 30 y.o.   MRN: [id number]  Chief Complaint  Patient presents with  . Headache    HPI Patient is in today for headaches.  Patient states she has been having headaches for years but recently they have been getting worse.  She states they occur at least 2-3 times per month and last about 3 days.  She used to be able to take Ibuprofen and get some relief but it no longer works for her.   She states that she gets blurred vision and redness in the whites of her eyes when she has the headaches.   She is in the process of getting an eye appointment scheduled.  She did note that one time when having a headache she happened to be at the dental office for a cleaning and they checked her blood pressure and it was elevated.  She states she normally does not have issues with her blood pressure.   Some nausea. +photophobia and phonophobia.   Past Medical History:  Diagnosis Date  . BV (bacterial vaginosis)   . Chlamydia   . Herpes genitalis in women   . HSV infection   . Migraines   . Miscarriage   . Trichomonas   . UTI (lower urinary tract infection)     Past Surgical History:  Procedure Laterality Date  . WISDOM TOOTH EXTRACTION      Family History  Problem Relation Age of Onset  . Hypertension Mother   . Cancer Mother        Pitutary  . Healthy Brother   . Hypertension Brother   . Healthy Father   . Breast cancer Maternal Grandmother   . Healthy Brother   . Colon cancer Neg Hx   . Ovarian cancer Neg Hx   . Cervical cancer Neg Hx     Social History   Socioeconomic History  . Marital status: Single    Spouse name: Not on file  . Number of children: 3  . Years of education: 34  . Highest education level: High school graduate  Occupational History  . Occupation: unemployed  Tobacco Use  . Smoking status: Never Smoker  . Smokeless tobacco: Never Used  Vaping Use  . Vaping Use:  Every day  . Substances: Flavoring  Substance and Sexual Activity  . Alcohol use: Not Currently    Comment: socially, occasionally  . Drug use: No  . Sexual activity: Yes    Partners: Male    Birth control/protection: Injection, Condom  Other Topics Concern  . Not on file  Social History Narrative  . Not on file   Social Determinants of Health   Financial Resource Strain: Not on file  Food Insecurity: Not on file  Transportation Needs: Not on file  Physical Activity: Not on file  Stress: Not on file  Social Connections: Not on file  Intimate Partner Violence: Not At Risk  . Fear of Current or Ex-Partner: No  . Emotionally Abused: No  . Physically Abused: No  . Sexually Abused: No    Outpatient Medications Prior to Visit  Medication Sig Dispense Refill  . ketorolac (TORADOL) 10 MG tablet Take 1 tablet (10 mg total) by mouth every 6 (six) hours as needed for moderate pain or severe pain (Headache). (Patient not taking: Reported on 01/11/2021) 20 tablet 0  . medroxyPROGESTERone (DEPO-PROVERA) 150 MG/ML injection Inject 150 mg into the muscle every  3 (three) months. (Patient not taking: Reported on 01/11/2021)    . metroNIDAZOLE (FLAGYL) 500 MG tablet Take 1 tablet (500 mg total) by mouth 2 (two) times daily. (Patient not taking: Reported on 01/11/2021) 14 tablet 0  . valACYclovir (VALTREX) 1000 MG tablet Take 1,000 mg by mouth daily. (Patient not taking: Reported on 01/11/2021)     Facility-Administered Medications Prior to Visit  Medication Dose Route Frequency Provider Last Rate Last Admin  . medroxyPROGESTERone (DEPO-PROVERA) injection 150 mg  150 mg Intramuscular Q90 days Hampton, Carla J, PA   150 mg at 01/03/21 1330    No Known Allergies  Review of Systems  Eyes: Positive for photophobia, redness and visual disturbance.  Neurological: Positive for headaches. Negative for dizziness.       Objective:    Physical Exam Constitutional:      General: She is not in acute  distress.    Appearance: Normal appearance.  HENT:     Head: Normocephalic and atraumatic.  Eyes:     General: No scleral icterus.    Extraocular Movements: Extraocular movements intact.     Conjunctiva/sclera: Conjunctivae normal.     Pupils: Pupils are equal, round, and reactive to light.  Cardiovascular:     Rate and Rhythm: Normal rate and regular rhythm.  Pulmonary:     Effort: Pulmonary effort is normal. No respiratory distress.  Musculoskeletal:     Right lower leg: No edema.     Left lower leg: No edema.  Neurological:     Mental Status: She is alert and oriented to person, place, and time. Mental status is at baseline.     Sensory: No sensory deficit.     Motor: No weakness.     Gait: Gait normal.     BP 132/66 (BP Location: Right Arm)   Pulse 80   Temp 98.1 F (36.7 C)   Wt 174 lb (78.9 kg)   LMP 03/05/2020 (Approximate)   SpO2 100%   BMI 28.96 kg/m  Wt Readings from Last 3 Encounters:  01/11/21 174 lb (78.9 kg)  01/03/21 170 lb 8 oz (77.3 kg)  12/09/20 180 lb (81.6 kg)    Health Maintenance Due  Topic Date Due  . Hepatitis C Screening  Never done  . COVID-19 Vaccine (1) Never done  . INFLUENZA VACCINE  Never done    There are no preventive care reminders to display for this patient.   No results found for: TSH Lab Results  Component Value Date   WBC 13.3 (H) 04/09/2020   HGB 12.8 04/09/2020   HCT 39.6 04/09/2020   MCV 96.8 04/09/2020   PLT 331 04/09/2020   Lab Results  Component Value Date   NA 140 06/06/2019   K 3.6 06/06/2019   CO2 25 06/06/2019   GLUCOSE 68 06/06/2019   BUN 12 06/06/2019   CREATININE 0.91 06/06/2019   BILITOT 0.3 06/06/2019   ALKPHOS 71 06/06/2019   AST 15 06/06/2019   ALT 7 06/06/2019   PROT 7.1 06/06/2019   ALBUMIN 4.3 06/06/2019   CALCIUM 9.2 06/06/2019   ANIONGAP 6 09/07/2017   Lab Results  Component Value Date   CHOL 157 06/06/2019   Lab Results  Component Value Date   HDL 49 06/06/2019   Lab  Results  Component Value Date   LDLCALC 91 06/06/2019   Lab Results  Component Value Date   TRIG 85 06/06/2019   Lab Results  Component Value Date   CHOLHDL 3.2 06/06/2019  No results found for: HGBA1C     Assessment & Plan:   Problem List Items Addressed This Visit      Cardiovascular and Mediastinum   Migraine without aura and without status migrainosus, not intractable - Primary    New problem/diagnosis Infrequent migraines, so no need for prophylaxis at this time Can start sumatriptan prn at first sign of migraine Discussed not to take more than 2 doses per day or 3 per week Phenergan for severe migraine abortion Return precautions discussed      Relevant Medications   SUMAtriptan (IMITREX) 50 MG tablet     Other   History of anemia    Feeling more fatigued Recheck CBC      Relevant Orders   CBC       Meds ordered this encounter  Medications  . SUMAtriptan (IMITREX) 50 MG tablet    Sig: Take 1 tablet (50 mg total) by mouth every 2 (two) hours as needed for migraine. May repeat in 2 hours if headache persists or recurs.    Dispense:  10 tablet    Refill:  11  . promethazine (PHENERGAN) 12.5 MG tablet    Sig: Take 1 tablet (12.5 mg total) by mouth every 8 (eight) hours as needed for nausea or vomiting.    Dispense:  20 tablet    Refill:  0    Return in about 5 months (around 06/10/2021) for CPE, as scheduled.   I, [name], MD, have reviewed all documentation for this visit. The documentation on 01/11/21 for the exam, diagnosis, procedures, and orders are all accurate and complete.   Semya Klinke, Marzella Schlein, MD, MPH [facility]

## 2021-01-11 NOTE — Assessment & Plan Note (Signed)
Feeling more fatigued Recheck CBC

## 2021-01-11 NOTE — Assessment & Plan Note (Signed)
New problem/diagnosis Infrequent migraines, so no need for prophylaxis at this time Can start sumatriptan prn at first sign of migraine Discussed not to take more than 2 doses per day or 3 per week Phenergan for severe migraine abortion Return precautions discussed

## 2021-01-25 DIAGNOSIS — Z419 Encounter for procedure for purposes other than remedying health state, unspecified: Secondary | ICD-10-CM | POA: Diagnosis not present

## 2021-02-25 DIAGNOSIS — Z419 Encounter for procedure for purposes other than remedying health state, unspecified: Secondary | ICD-10-CM | POA: Diagnosis not present

## 2021-03-25 ENCOUNTER — Other Ambulatory Visit (HOSPITAL_COMMUNITY)
Admission: RE | Admit: 2021-03-25 | Discharge: 2021-03-25 | Disposition: A | Discharge status: Home / Self Care | Payer: Medicaid Other | Source: Ambulatory Visit | Attending: Family Medicine | Admitting: Family Medicine

## 2021-03-25 ENCOUNTER — Encounter: Payer: Self-pay | Admitting: Family Medicine

## 2021-03-25 ENCOUNTER — Ambulatory Visit (INDEPENDENT_AMBULATORY_CARE_PROVIDER_SITE_OTHER): Payer: Medicaid Other | Admitting: Family Medicine

## 2021-03-25 ENCOUNTER — Other Ambulatory Visit: Payer: Self-pay

## 2021-03-25 VITALS — BP 119/79 | HR 79 | Temp 98.5°F | Resp 16 | Ht 65.0 in | Wt 176.0 lb

## 2021-03-25 DIAGNOSIS — Z113 Encounter for screening for infections with a predominantly sexual mode of transmission: Secondary | ICD-10-CM | POA: Diagnosis not present

## 2021-03-25 DIAGNOSIS — G43009 Migraine without aura, not intractable, without status migrainosus: Secondary | ICD-10-CM | POA: Diagnosis not present

## 2021-03-25 NOTE — Progress Notes (Signed)
I,April Miller,acting as a Neurosurgeon for [employer], PA-C.,have documented all relevant documentation on the behalf of Dortha Kern, PA-C,as directed by  [employer], PA-C while in the presence of [employer], PA-C.  Established patient visit   Patient: Jody Perez   DOB: [birth year]   30 y.o. Female  MRN: [id number] Visit Date: 03/25/2021  Today's healthcare provider: Dortha Kern, PA-C   Chief Complaint  Patient presents with   Follow-up   Subjective    HPI  Migraine without aura and without status migrainosus, not intractable From 01/11/2021-Can start sumatriptan prn at first sign of migraine. Discussed not to take more than 2 doses per day or 3 per week. Phenergan for severe migraine abortionReturn precautions discussed.   Past Medical History:  Diagnosis Date   BV (bacterial vaginosis)    Chlamydia    Herpes genitalis in women    HSV infection    Migraines    Miscarriage    Trichomonas    UTI (lower urinary tract infection)    Past Surgical History:  Procedure Laterality Date   WISDOM TOOTH EXTRACTION     Social History   Tobacco Use   Smoking status: Never Smoker   Smokeless tobacco: Never Used  Vaping Use   Vaping Use: Every day   Substances: Flavoring  Substance Use Topics   Alcohol use: Not Currently    Comment: socially, occasionally   Drug use: No   Family History  Problem Relation Age of Onset   Hypertension Mother    Cancer Mother        Pitutary   Healthy Brother    Hypertension Brother    Healthy Father    Breast cancer Maternal Grandmother    Healthy Brother    Colon cancer Neg Hx    Ovarian cancer Neg Hx    Cervical cancer Neg Hx    No Known Allergies     Medications: Outpatient Medications Prior to Visit  Medication Sig   promethazine (PHENERGAN) 12.5 MG tablet Take 1 tablet (12.5 mg total) by mouth every 8 (eight) hours as needed for nausea or vomiting. (Patient not taking: Reported on 03/25/2021)    SUMAtriptan (IMITREX) 50 MG tablet Take 1 tablet (50 mg total) by mouth every 2 (two) hours as needed for migraine. May repeat in 2 hours if headache persists or recurs. (Patient not taking: Reported on 03/25/2021)   Facility-Administered Medications Prior to Visit  Medication Dose Route Frequency Provider   medroxyPROGESTERone (DEPO-PROVERA) injection 150 mg  150 mg Intramuscular Q90 days Hampton, Carla J, [place]    Review of Systems  Constitutional: Negative for appetite change, chills, fatigue and fever.  Respiratory: Negative for chest tightness and shortness of breath.   Cardiovascular: Negative for chest pain and palpitations.  Gastrointestinal: Negative for abdominal pain, nausea and vomiting.  Neurological: Negative for dizziness and weakness.       Objective    BP 119/79 (BP Location: Right Arm, Patient Position: Sitting, Cuff Size: Large)   Pulse 79   Temp 98.5 F (36.9 C) (Oral)   Resp 16   Ht 5\' 5"  (1.651 m)   Wt 176 lb (79.8 kg)   SpO2 99%   BMI 29.29 kg/m     Physical Exam Constitutional:      General: She is not in acute distress.    Appearance: She is well-developed.  HENT:     Head: Normocephalic and atraumatic.     Right Ear: Hearing and tympanic membrane  normal.     Left Ear: Hearing and tympanic membrane normal.     Nose: Nose normal.  Eyes:     General: Lids are normal. No scleral icterus.       Right eye: No discharge.        Left eye: No discharge.     Conjunctiva/sclera: Conjunctivae normal.  Cardiovascular:     Rate and Rhythm: Normal rate and regular rhythm.     Heart sounds: Normal heart sounds.  Pulmonary:     Effort: Pulmonary effort is normal. No respiratory distress.     Breath sounds: Normal breath sounds.  Abdominal:     General: Bowel sounds are normal.     Palpations: Abdomen is soft.  Musculoskeletal:        General: Normal range of motion.     Cervical back: Neck supple.  Skin:    Findings: No lesion or rash.  Neurological:      Mental Status: She is alert and oriented to person, place, and time.  Psychiatric:        Speech: Speech normal.        Behavior: Behavior normal.        Thought Content: Thought content normal.       No results found for any visits on 03/25/21.  Assessment & Plan     1. Screen for STD (sexually transmitted disease) Concerned about possible STD. States partner not having any symptoms. She has not had any discharge recently or pain with intercourse. Warned against multiple partners. Declines blood tests. Will get urine cytology for STD. Presently on Depo-Provera q 3 months for contraception. - Urine cytology ancillary only  2. Migraine without aura and without status migrainosus, not intractable No migraine today. Infrequent occurrences. Imitrex helping to abort headaches.   No follow-ups on file.      I, Francy Mcilvaine, PA-C, have reviewed all documentation for this visit. The documentation on 08/03/21 for the exam, diagnosis, procedures, and orders are all accurate and complete.    Dortha Kern, PA-C  [facility] [phone] (phone) [phone] (fax)  [facility]

## 2021-03-27 DIAGNOSIS — Z419 Encounter for procedure for purposes other than remedying health state, unspecified: Secondary | ICD-10-CM | POA: Diagnosis not present

## 2021-03-28 ENCOUNTER — Other Ambulatory Visit: Payer: Self-pay | Admitting: Family Medicine

## 2021-03-28 NOTE — Telephone Encounter (Signed)
Copied from CRM [id number]. Topic: Quick Communication - Rx Refill/Question >> Mar 28, 2021  2:47 PM Jaquita Rector A wrote: Medication: acyclovir (ZOVIRAX) 800 MG tablet   Has the patient contacted their pharmacy? Yes.   (Agent: If no, request that the patient contact the pharmacy for the refill.) (Agent: If yes, when and what did the pharmacy advise?)  Preferred Pharmacy (with phone number or street name): [id number] [place], [place] - [address] AT [facility]  Phone:  [phone] Fax:  [phone]     Agent: Please be advised that RX refills may take up to 3 business days. We ask that you follow-up with your pharmacy.

## 2021-03-28 NOTE — Telephone Encounter (Signed)
Medication that has been requested is not on current medication list. Review for new script

## 2021-03-29 ENCOUNTER — Ambulatory Visit: Payer: Medicaid Other | Admitting: Advanced Practice Midwife

## 2021-03-29 ENCOUNTER — Encounter: Payer: Self-pay | Admitting: Advanced Practice Midwife

## 2021-03-29 ENCOUNTER — Other Ambulatory Visit: Payer: Self-pay

## 2021-03-29 DIAGNOSIS — Z113 Encounter for screening for infections with a predominantly sexual mode of transmission: Secondary | ICD-10-CM

## 2021-03-29 LAB — URINE CYTOLOGY ANCILLARY ONLY
Bacterial Vaginitis-Urine: NEGATIVE
Candida Urine: NEGATIVE
Chlamydia: NEGATIVE
Comment: NEGATIVE
Comment: NEGATIVE
Comment: NORMAL
Neisseria Gonorrhea: NEGATIVE
Trichomonas: NEGATIVE

## 2021-03-29 LAB — WET PREP FOR TRICH, YEAST, CLUE
Trichomonas Exam: NEGATIVE
Yeast Exam: NEGATIVE

## 2021-03-29 MED ORDER — VALACYCLOVIR HCL 1 G PO TABS: 1000.0000 mg | ORAL_TABLET | Freq: Every day | ORAL | 5 refills | Status: DC

## 2021-03-29 NOTE — Progress Notes (Signed)
[facility] Department STI clinic/screening visit  Subjective:  Jody Perez is a 30 y.o. SBF G5P3 female being seen today for an STI screening visit. The patient reports they do have symptoms.  Patient reports that they do not desire a pregnancy in the next year.   They reported they are not interested in discussing contraception today.  No LMP recorded. Patient has had an injection.   Patient has the following medical conditions:   Patient Active Problem List   Diagnosis Date Noted  . Migraine without aura and without status migrainosus, not intractable 01/11/2021  . Vapes nicotine containing substance 07/30/2020  . Obesity BMI=30.4 04/07/2020  . Recurrent vaginitis 06/06/2019  . History of anemia 03/27/2018  . History of genital HSV 01/28/2013    Chief Complaint  Patient presents with  . SEXUALLY TRANSMITTED DISEASE    STD screening except bloodwork    HPI  Patient reports rectal itching x2 days.  Last sex 03/26/21 without condom; with current partner x 2 years; 1 partner in last 3 mo. Last ETOH 03/26/21 (3 shots liquor) 1x/wk. LMP not on DMPA  Last HIV test per patient/review of record was 03/25/21 Patient reports last pap was 11/12/18 neg  See flowsheet for further details and programmatic requirements.    The following portions of the patient's history were reviewed and updated as appropriate: allergies, current medications, past medical history, past social history, past surgical history and problem list.  Objective:  There were no vitals filed for this visit.  Physical Exam Vitals and nursing note reviewed.  Constitutional:      Appearance: Normal appearance.  HENT:     Head: Normocephalic and atraumatic.     Comments: Negative lymphadenopathy    Mouth/Throat:     Mouth: Mucous membranes are moist.     Pharynx: Oropharynx is clear. No oropharyngeal exudate or posterior oropharyngeal erythema.  Eyes:     Conjunctiva/sclera: Conjunctivae normal.   Pulmonary:     Effort: Pulmonary effort is normal.  Chest:  Breasts:     Right: No axillary adenopathy or supraclavicular adenopathy.     Left: No axillary adenopathy or supraclavicular adenopathy.    Abdominal:     Palpations: Abdomen is soft. There is no mass.     Tenderness: There is no abdominal tenderness. There is no rebound.     Comments: Soft without masses or tenderness  Genitourinary:    General: Normal vulva.     Exam position: Lithotomy position.     Pubic Area: No rash or pubic lice.      Labia:        Right: No rash or lesion.        Left: No rash or lesion.      Vagina: Vaginal discharge (white creamy leukorrhea, ph<4.5) present. No erythema, bleeding or lesions.     Cervix: Normal.     Uterus: Normal.      Adnexa: Right adnexa normal and left adnexa normal.     Rectum: Normal.  Lymphadenopathy:     Head:     Right side of head: No preauricular or posterior auricular adenopathy.     Left side of head: No preauricular or posterior auricular adenopathy.     Cervical: No cervical adenopathy.     Upper Body:     Right upper body: No supraclavicular or axillary adenopathy.     Left upper body: No supraclavicular or axillary adenopathy.     Lower Body: No right inguinal adenopathy. No left  inguinal adenopathy.  Skin:    General: Skin is warm and dry.     Findings: No rash.  Neurological:     Mental Status: She is alert and oriented to person, place, and time.      Assessment and Plan:  Jody Perez is a 30 y.o. female presenting to the [facility] Department for STI screening  1. Screening examination for venereal disease Treat wet mount per standing orders Immunization nurse consult - Gonococcus culture - Chlamydia/Gonorrhea [place] [facility] - Gonococcus culture     No follow-ups on file.  Future Appointments  Date Time Provider Department Center  04/29/2021  3:40 PM Malva Limes, MD [facility]  06/17/2021  9:00 AM Bacigalupo, Marzella Schlein,  MD [facility]    Alberteen Spindle, CNM

## 2021-03-29 NOTE — Progress Notes (Signed)
Wet mount reviewed, no tx per standing orders. Provider orders completed. Pt stated she is not interested in [facility] today.

## 2021-04-02 LAB — GONOCOCCUS CULTURE

## 2021-04-27 DIAGNOSIS — Z419 Encounter for procedure for purposes other than remedying health state, unspecified: Secondary | ICD-10-CM | POA: Diagnosis not present

## 2021-04-29 ENCOUNTER — Other Ambulatory Visit: Payer: Self-pay

## 2021-04-29 ENCOUNTER — Encounter: Payer: Self-pay | Admitting: Family Medicine

## 2021-04-29 ENCOUNTER — Ambulatory Visit (INDEPENDENT_AMBULATORY_CARE_PROVIDER_SITE_OTHER): Payer: Medicaid Other | Admitting: Family Medicine

## 2021-04-29 VITALS — BP 115/77 | HR 78 | Temp 97.9°F | Resp 16 | Ht 64.5 in | Wt 179.0 lb

## 2021-04-29 DIAGNOSIS — Z01818 Encounter for other preprocedural examination: Secondary | ICD-10-CM

## 2021-04-29 NOTE — Patient Instructions (Signed)
.   Please review the attached list of medications and notify my office if there are any errors.   . Please bring all of your medications to every appointment so we can make sure that our medication list is the same as yours.   

## 2021-04-29 NOTE — Progress Notes (Signed)
    Established patient visit   Patient: Jody Perez   DOB: 06/20/1991   30 y.o. Female  MRN: [id number] Visit Date: 04/29/2021  Today's healthcare provider: Donald Fisher, MD   Chief Complaint  Patient presents with   Medical Clearance   Subjective    HPI  Medical clearance: Patient is here today requesting medical clearance for upcoming surgery which is scheduled for 06/08/2021 at [facility] in [place]. She is scheduled to have liposuction with fat transfer to buttocks. She has never had surgery under general anesthesia. She denied chest pains, palpitations, and dyspnea on exertion. She ambulates without difficulty.       Medications: Outpatient Medications Prior to Visit  Medication Sig   valACYclovir (VALTREX) 1000 MG tablet Take 1 tablet (1,000 mg total) by mouth daily.   SUMAtriptan (IMITREX) 50 MG tablet Take 1 tablet (50 mg total) by mouth every 2 (two) hours as needed for migraine. May repeat in 2 hours if headache persists or recurs. (Patient not taking: No sig reported)   [DISCONTINUED] promethazine (PHENERGAN) 12.5 MG tablet Take 1 tablet (12.5 mg total) by mouth every 8 (eight) hours as needed for nausea or vomiting. (Patient not taking: Reported on 03/25/2021)   Facility-Administered Medications Prior to Visit  Medication Dose Route Frequency Provider   medroxyPROGESTERone (DEPO-PROVERA) injection 150 mg  150 mg Intramuscular Q90 days Hampton, Carla J, PA    Review of Systems  Constitutional: Negative for appetite change, chills, fatigue and fever.  Respiratory: Negative for chest tightness and shortness of breath.   Cardiovascular: Negative for chest pain and palpitations.  Gastrointestinal: Negative for abdominal pain, nausea and vomiting.  Neurological: Negative for dizziness and weakness.       Objective    BP 115/77 (BP Location: Left Arm, Patient Position: Sitting, Cuff Size: Normal)   Pulse 78   Temp 97.9 F (36.6 C) (Temporal)    Resp 16   Ht 5' 4.5" (1.638 m)   Wt 179 lb (81.2 kg)   LMP  (Within Years)   SpO2 99% Comment: room air  BMI 30.25 kg/m     Physical Exam  General Appearance:    Mildly obese female, alert, cooperative, in no acute distress  HENT:   ENT exam normal, no neck nodes or sinus tenderness  Eyes:    PERRL, conjunctiva/corneas clear, EOM's intact       Lungs:     Clear to auscultation bilaterally, respirations unlabored  Heart:    Normal heart rate. Normal rhythm. No murmurs, rubs, or gallops.    Neurologic:   Awake, alert, oriented x 3. No apparent focal neurological           defect.        EKG: NSR with no abnormalities.   Office Visit on 04/29/2021  Component Date Value Ref Range Status   WBC 04/29/2021 9.7  3.4 - 10.8 x10E3/uL Final   RBC 04/29/2021 4.19  3.77 - 5.28 x10E6/uL Final   Hemoglobin 04/29/2021 12.4  11.1 - 15.9 g/dL Final   Hematocrit 04/29/2021 38.0  34.0 - 46.6 % Final   MCV 04/29/2021 91  79 - 97 fL Final   MCH 04/29/2021 29.6  26.6 - 33.0 pg Final   MCHC 04/29/2021 32.6  31.5 - 35.7 g/dL Final   RDW 04/29/2021 12.3  11.7 - 15.4 % Final   Platelets 04/29/2021 314  150 - 450 x10E3/uL Final   Glucose 04/29/2021 86  65 - 99 mg/dL Final     BUN 04/29/2021 12  6 - 20 mg/dL Final   Creatinine, Ser 04/29/2021 0.93  0.57 - 1.00 mg/dL Final   eGFR 04/29/2021 85  >59 mL/min/1.73 Final   BUN/Creatinine Ratio 04/29/2021 13  9 - 23 Final   Sodium 04/29/2021 140  134 - 144 mmol/L Final   Potassium 04/29/2021 3.7  3.5 - 5.2 mmol/L Final   Chloride 04/29/2021 102  96 - 106 mmol/L Final   CO2 04/29/2021 21  20 - 29 mmol/L Final   Calcium 04/29/2021 9.2  8.7 - 10.2 mg/dL Final   Total Protein 04/29/2021 8.0  6.0 - 8.5 g/dL Final   Albumin 04/29/2021 4.4  3.9 - 5.0 g/dL Final   Globulin, Total 04/29/2021 3.6  1.5 - 4.5 g/dL Final   Albumin/Globulin Ratio 04/29/2021 1.2  1.2 - 2.2 Final   Bilirubin Total 04/29/2021 0.3  0.0 - 1.2 mg/dL Final   Alkaline Phosphatase 04/29/2021 69   44 - 121 IU/L Final   AST 04/29/2021 12  0 - 40 IU/L Final   ALT 04/29/2021 8  0 - 32 IU/L Final   HIV Screen 4th Generation wRfx 04/29/2021 Non Reactive  Non Reactive Final   INR 04/29/2021 1.0  0.9 - 1.2 Final   Prothrombin Time 04/29/2021 10.8  9.1 - 12.0 sec Final   aPTT 04/29/2021 27  24 - 33 sec Final   hCG,Beta Subunit,Qual,Serum 04/29/2021 Negative  Negative <6 mIU/mL Final   TSH 04/29/2021 0.750  0.450 - 4.500 uIU/mL Final   T4, Total 04/29/2021 7.1  4.5 - 12.0 ug/dL Final   T3, Total 04/29/2021 107  71 - 180 ng/dL Final   Hgb A1c MFr Bld 04/29/2021 4.9  4.8 - 5.6 % Final   Est. average glucose Bld gHb Est-m* 04/29/2021 94  mg/dL Final     Assessment & Plan     1. Preop examination  Patient has no absolute or relative contraindications to planned surgery and anesthesia including general anesthesia. She is at low risk for cardiac and pulmonary complications.        The entirety of the information documented in the History of Present Illness, Review of Systems and Physical Exam were personally obtained by me. Portions of this information were initially documented by the CMA and reviewed by me for thoroughness and accuracy.     Donald Fisher, MD  [facility] [phone] (phone) [phone] (fax)  [facility] 

## 2021-04-30 LAB — CBC
Hematocrit: 38 % (ref 34.0–46.6)
Hemoglobin: 12.4 g/dL (ref 11.1–15.9)
MCH: 29.6 pg (ref 26.6–33.0)
MCHC: 32.6 g/dL (ref 31.5–35.7)
MCV: 91 fL (ref 79–97)
Platelets: 314 10*3/uL (ref 150–450)
RBC: 4.19 x10E6/uL (ref 3.77–5.28)
RDW: 12.3 % (ref 11.7–15.4)
WBC: 9.7 10*3/uL (ref 3.4–10.8)

## 2021-04-30 LAB — COMPREHENSIVE METABOLIC PANEL
ALT: 8 IU/L (ref 0–32)
AST: 12 IU/L (ref 0–40)
Albumin/Globulin Ratio: 1.2 (ref 1.2–2.2)
Albumin: 4.4 g/dL (ref 3.9–5.0)
Alkaline Phosphatase: 69 IU/L (ref 44–121)
BUN/Creatinine Ratio: 13 (ref 9–23)
BUN: 12 mg/dL (ref 6–20)
Bilirubin Total: 0.3 mg/dL (ref 0.0–1.2)
CO2: 21 mmol/L (ref 20–29)
Calcium: 9.2 mg/dL (ref 8.7–10.2)
Chloride: 102 mmol/L (ref 96–106)
Creatinine, Ser: 0.93 mg/dL (ref 0.57–1.00)
Globulin, Total: 3.6 g/dL (ref 1.5–4.5)
Glucose: 86 mg/dL (ref 65–99)
Potassium: 3.7 mmol/L (ref 3.5–5.2)
Sodium: 140 mmol/L (ref 134–144)
Total Protein: 8 g/dL (ref 6.0–8.5)
eGFR: 85 mL/min/{1.73_m2} (ref >59)

## 2021-04-30 LAB — T3: T3, Total: 107 ng/dL (ref 71–180)

## 2021-04-30 LAB — PT AND PTT
INR: 1 (ref 0.9–1.2)
Prothrombin Time: 10.8 s (ref 9.1–12.0)
aPTT: 27 s (ref 24–33)

## 2021-04-30 LAB — HCG, SERUM, QUALITATIVE: hCG,Beta Subunit,Qual,Serum: NEGATIVE m[IU]/mL (ref <6)

## 2021-04-30 LAB — T4 AND TSH
T4, Total: 7.1 ug/dL (ref 4.5–12.0)
TSH: 0.75 u[IU]/mL (ref 0.450–4.500)

## 2021-04-30 LAB — HIV ANTIBODY (ROUTINE TESTING W REFLEX): HIV Screen 4th Generation wRfx: NONREACTIVE

## 2021-05-03 ENCOUNTER — Telehealth: Payer: Self-pay

## 2021-05-03 NOTE — Telephone Encounter (Signed)
Request for medical records   Copied from CRM [id number]. Topic: General - Other >> May 03, 2021  2:14 PM Herby Abraham C wrote: Reason for CRM: pt called in for assistance. Pt was says that she was told by her plastic surgeon to reach out to PCP to request lab results for surgical clearance visit (04/29/21). Pt would like to have them faxed to:   [phone]   Plastic Surgery

## 2021-05-05 ENCOUNTER — Telehealth: Payer: Self-pay

## 2021-05-05 NOTE — Telephone Encounter (Signed)
Copied from CRM [id number]. Topic: General - Other >> May 05, 2021 11:41 AM Tamela Oddi wrote: Reason for CRM: Patient stated she is returning a call to the office.  Did not know who called but would like a call back to discuss at [phone]

## 2021-05-10 NOTE — Telephone Encounter (Signed)
Patient calling back about papers being faxed faxed for surgical clearance

## 2021-05-10 NOTE — Telephone Encounter (Signed)
Patient calling back about papers being faxed faxed for surgical clearance. Please call back.

## 2021-05-12 ENCOUNTER — Telehealth: Payer: Self-pay

## 2021-05-12 NOTE — Telephone Encounter (Signed)
Copied from CRM [id number]. Topic: General - Other >> May 12, 2021 12:18 PM Jody Perez A wrote: Reason for CRM: Patient would like to be contacted by a member of staff to discuss lab results form 04/29/21  Please contact when possible

## 2021-05-12 NOTE — Telephone Encounter (Signed)
Please review lab results.   Thanks,   -Yuma Blucher  

## 2021-05-12 NOTE — Telephone Encounter (Signed)
Labs are all normal.

## 2021-05-13 NOTE — Telephone Encounter (Signed)
Pt advised.   Thanks,   -Tamyka Bezio  

## 2021-05-23 ENCOUNTER — Telehealth: Payer: Self-pay

## 2021-05-23 NOTE — Telephone Encounter (Signed)
Copied from CRM [id number]. Topic: Appointment Scheduling - Scheduling Inquiry for Clinic >> May 23, 2021  4:36 PM Marylen Ponto wrote: Reason for CRM: Pt stated she has surgery scheduled for 06/08/21 and she needs an appt for medical clearance. There are no appts available with Dr. B before date of pt surgery. Pt requests call back asap to advise.

## 2021-05-24 NOTE — Telephone Encounter (Signed)
Reached out to the patient and she stated surgery had been pushed out to September so she is going to be okay with original appt date.

## 2021-05-24 NOTE — Telephone Encounter (Signed)
Can we schedule patient with an available provider such as dennis prior to 06/08/21? KW

## 2021-05-27 DIAGNOSIS — Z419 Encounter for procedure for purposes other than remedying health state, unspecified: Secondary | ICD-10-CM | POA: Diagnosis not present

## 2021-06-17 ENCOUNTER — Encounter: Payer: Self-pay | Admitting: Family Medicine

## 2021-06-17 NOTE — Progress Notes (Deleted)
Complete physical exam   Patient: Jody Perez   DOB: March 18, 1991   30 y.o. Female  MRN: [id number] Visit Date: 06/17/2021  Today's healthcare provider: [name], MD   No chief complaint on file.  Subjective    Jody Perez is a 30 y.o. female who presents today for a complete physical exam.  She reports consuming a {diet types:17450} diet. {Exercise:19826} She generally feels {well/fairly well/poorly:18703}. She reports sleeping {well/fairly well/poorly:18703}. She {does/does not:200015} have additional problems to discuss today.  HPI  11/12/18 Pap-negative   Past Medical History:  Diagnosis Date   BV (bacterial vaginosis)    Chlamydia    Herpes genitalis in women    HSV infection    Migraines    Miscarriage    Trichomonas    UTI (lower urinary tract infection)    Past Surgical History:  Procedure Laterality Date   WISDOM TOOTH EXTRACTION     Social History   Socioeconomic History   Marital status: Single    Spouse name: Not on file   Number of children: 3   Years of education: 12   Highest education level: High school graduate  Occupational History   Occupation: unemployed  Tobacco Use   Smoking status: Every Day    Types: E-cigarettes   Smokeless tobacco: Never  Vaping Use   Vaping Use: Every day   Substances: Flavoring  Substance and Sexual Activity   Alcohol use: Yes    Alcohol/week: 3.0 standard drinks    Types: 3 Shots of liquor per week    Comment: 1x/wk   Drug use: No   Sexual activity: Yes    Partners: Male    Birth control/protection: Injection, Condom  Other Topics Concern   Not on file  Social History Narrative   Not on file   Social Determinants of Health   Financial Resource Strain: Not on file  Food Insecurity: Not on file  Transportation Needs: Not on file  Physical Activity: Not on file  Stress: Not on file  Social Connections: Not on file  Intimate Partner Violence: Not on file   Family Status  Relation Name  Status   Mother  Alive   Brother  Alive   Father  Alive   MGM  Deceased   Brother  Alive   Neg Hx  (Not Specified)   Family History  Problem Relation Age of Onset   Hypertension Mother    Cancer Mother        Pitutary   Healthy Brother    Hypertension Brother    Healthy Father    Breast cancer Maternal Grandmother    Healthy Brother    Colon cancer Neg Hx    Ovarian cancer Neg Hx    Cervical cancer Neg Hx    No Known Allergies  Patient Care Team: Erasmo Downer, MD as PCP - General (Family Medicine)   Medications: Outpatient Medications Prior to Visit  Medication Sig   SUMAtriptan (IMITREX) 50 MG tablet Take 1 tablet (50 mg total) by mouth every 2 (two) hours as needed for migraine. May repeat in 2 hours if headache persists or recurs. (Patient not taking: No sig reported)   valACYclovir (VALTREX) 1000 MG tablet Take 1 tablet (1,000 mg total) by mouth daily.   Facility-Administered Medications Prior to Visit  Medication Dose Route Frequency Provider   medroxyPROGESTERone (DEPO-PROVERA) injection 150 mg  150 mg Intramuscular Q90 days Matt Holmes, [place]    Review of Systems  Last  CBC Lab Results  Component Value Date   WBC 9.7 04/29/2021   HGB 12.4 04/29/2021   HCT 38.0 04/29/2021   MCV 91 04/29/2021   MCH 29.6 04/29/2021   RDW 12.3 04/29/2021   PLT 314 04/29/2021   Last metabolic panel Lab Results  Component Value Date   GLUCOSE 86 04/29/2021   NA 140 04/29/2021   K 3.7 04/29/2021   CL 102 04/29/2021   CO2 21 04/29/2021   BUN 12 04/29/2021   CREATININE 0.93 04/29/2021   GFRNONAA 87 06/06/2019   GFRAA 100 06/06/2019   CALCIUM 9.2 04/29/2021   PROT 8.0 04/29/2021   ALBUMIN 4.4 04/29/2021   LABGLOB 3.6 04/29/2021   AGRATIO 1.2 04/29/2021   BILITOT 0.3 04/29/2021   ALKPHOS 69 04/29/2021   AST 12 04/29/2021   ALT 8 04/29/2021   ANIONGAP 6 09/07/2017   Last lipids Lab Results  Component Value Date   CHOL 157 06/06/2019   HDL 49 06/06/2019    LDLCALC 91 06/06/2019   TRIG 85 06/06/2019   CHOLHDL 3.2 06/06/2019   Last thyroid functions Lab Results  Component Value Date   TSH 0.750 04/29/2021   T3TOTAL 107 04/29/2021   T4TOTAL 7.1 04/29/2021      Objective    There were no vitals taken for this visit. BP Readings from Last 3 Encounters:  04/29/21 115/77  03/25/21 119/79  01/11/21 132/66   Wt Readings from Last 3 Encounters:  04/29/21 179 lb (81.2 kg)  03/25/21 176 lb (79.8 kg)  01/11/21 174 lb (78.9 kg)      Physical Exam  ***  Last depression screening scores PHQ 2/9 Scores 04/29/2021 01/11/2021 06/17/2020  PHQ - 2 Score 0 0 0  PHQ- 9 Score 0 0 0   Last fall risk screening Fall Risk  01/11/2021  Falls in the past year? 0  Number falls in past yr: 0  Injury with Fall? 0   Last Audit-C alcohol use screening Alcohol Use Disorder Test (AUDIT) 01/11/2021  1. How often do you have a drink containing alcohol? 3  2. How many drinks containing alcohol do you have on a typical day when you are drinking? 1  3. How often do you have six or more drinks on one occasion? 0  AUDIT-C Score 4  4. How often during the last year have you found that you were not able to stop drinking once you had started? -  5. How often during the last year have you failed to do what was normally expected from you because of drinking? -  6. How often during the last year have you needed a first drink in the morning to get yourself going after a heavy drinking session? -  7. How often during the last year have you had a feeling of guilt of remorse after drinking? -  8. How often during the last year have you been unable to remember what happened the night before because you had been drinking? -  9. Have you or someone else been injured as a result of your drinking? -  10. Has a relative or friend or a doctor or another health worker been concerned about your drinking or suggested you cut down? -  Alcohol Use Disorder Identification Test Final  Score (AUDIT) -  Alcohol Brief Interventions/Follow-up AUDIT Score <7 follow-up not indicated   A score of 3 or more in women, and 4 or more in men indicates increased risk for alcohol abuse, EXCEPT if  all of the points are from question 1   No results found for any visits on 06/17/21.  Assessment & Plan    Routine Health Maintenance and Physical Exam  Exercise Activities and Dietary recommendations  Goals   None     Immunization History  Administered Date(s) Administered   Tdap 07/30/2013, 09/27/2016, 09/18/2017    Health Maintenance  Topic Date Due   COVID-19 Vaccine (1) Never done   Pneumococcal Vaccine 39-98 Years old (1 - PCV) Never done   Hepatitis C Screening  Never done   INFLUENZA VACCINE  06/27/2021   PAP-Cervical Cytology Screening  11/12/2021   PAP SMEAR-Modifier  11/12/2021   TETANUS/TDAP  09/19/2027   HIV Screening  Completed   HPV VACCINES  Aged Out    Discussed health benefits of physical activity, and encouraged her to engage in regular exercise appropriate for her age and condition.  ***  No follow-ups on file.     {provider attestation***:1}   [name], MD  [facility] [phone] (phone) [phone] (fax)  [facility]

## 2021-06-24 ENCOUNTER — Encounter: Payer: Self-pay | Admitting: Emergency Medicine

## 2021-06-24 ENCOUNTER — Other Ambulatory Visit: Payer: Self-pay

## 2021-06-24 ENCOUNTER — Ambulatory Visit
Admission: EM | Admit: 2021-06-24 | Discharge: 2021-06-24 | Disposition: A | Discharge status: Home / Self Care | Payer: Medicaid Other | Attending: Family Medicine | Admitting: Family Medicine

## 2021-06-24 DIAGNOSIS — N898 Other specified noninflammatory disorders of vagina: Secondary | ICD-10-CM | POA: Insufficient documentation

## 2021-06-24 LAB — WET PREP, GENITAL
Clue Cells Wet Prep HPF POC: NONE SEEN
Sperm: NONE SEEN
Trich, Wet Prep: NONE SEEN
Yeast Wet Prep HPF POC: NONE SEEN

## 2021-06-24 LAB — CHLAMYDIA/NGC RT PCR (ARMC ONLY)
Chlamydia Tr: NOT DETECTED
N gonorrhoeae: NOT DETECTED

## 2021-06-24 LAB — URINALYSIS, COMPLETE (UACMP) WITH MICROSCOPIC
Bilirubin Urine: NEGATIVE
Glucose, UA: NEGATIVE mg/dL
Hgb urine dipstick: NEGATIVE
Ketones, ur: NEGATIVE mg/dL
Leukocytes,Ua: NEGATIVE
Nitrite: NEGATIVE
Protein, ur: NEGATIVE mg/dL
Specific Gravity, Urine: 1.02 (ref 1.005–1.030)
pH: 6.5 (ref 5.0–8.0)

## 2021-06-24 MED ORDER — METRONIDAZOLE 500 MG PO TABS: 500.0000 mg | ORAL_TABLET | Freq: Two times a day (BID) | ORAL | 0 refills | Status: DC

## 2021-06-24 MED ORDER — FLUCONAZOLE 150 MG PO TABS: 150.0000 mg | ORAL_TABLET | Freq: Once | ORAL | 0 refills | Status: AC

## 2021-06-24 NOTE — Discharge Instructions (Addendum)
Medication as prescribed.  Take care  Dr. Donevan Biller  

## 2021-06-24 NOTE — ED Triage Notes (Signed)
Patient c/o vaginal itching, irritation and discharged that started 3 days ago.  Patient also reports some urinary frequency.

## 2021-06-24 NOTE — ED Provider Notes (Signed)
[facility]    CSN: [id number] Arrival date & time: 06/24/21  1334      History   Chief Complaint Chief Complaint  Patient presents with   Vaginal Itching    HPI  30 year old female presents with vaginal irritation.  Patient reports that she has had vaginal irritation for the past 3 days.  She denies vaginal discharge.  She states that the area burns.  She states that it is painful when she bathes.  She states that she has had urinary frequency as well.  She is concerned that she may have a vaginal infection.  She states that she is not concerned for STDs.  She is having unprotected intercourse.  Pain 4/10 in severity.  Described as a burning sensation.  No other complaints.  Past Medical History:  Diagnosis Date   BV (bacterial vaginosis)    Chlamydia    Herpes genitalis in women    HSV infection    Migraines    Miscarriage    Trichomonas    UTI (lower urinary tract infection)     Patient Active Problem List   Diagnosis Date Noted   Migraine without aura and without status migrainosus, not intractable 01/11/2021   Vapes nicotine containing substance 07/30/2020   Obesity BMI=30.4 04/07/2020   Recurrent vaginitis 06/06/2019   History of anemia 03/27/2018   History of genital HSV 01/28/2013    Past Surgical History:  Procedure Laterality Date   WISDOM TOOTH EXTRACTION      OB History     Gravida  6   Para  3   Term  3   Preterm  0   AB  2   Living  3      SAB  2   IAB  0   Ectopic  0   Multiple  0   Live Births  3            Home Medications    Prior to Admission medications   Medication Sig Start Date End Date Taking? Authorizing Provider  fluconazole (DIFLUCAN) 150 MG tablet Take 1 tablet (150 mg total) by mouth once for 1 dose. Repeat dose in 72 hours. 06/24/21 06/24/21 Yes Merridy Pascoe G, DO  metroNIDAZOLE (FLAGYL) 500 MG tablet Take 1 tablet (500 mg total) by mouth 2 (two) times daily. 06/24/21  Yes Tommie Sams, DO     Family History Family History  Problem Relation Age of Onset   Hypertension Mother    Cancer Mother        Pitutary   Healthy Brother    Hypertension Brother    Healthy Father    Breast cancer Maternal Grandmother    Healthy Brother    Colon cancer Neg Hx    Ovarian cancer Neg Hx    Cervical cancer Neg Hx     Social History Social History   Tobacco Use   Smoking status: Every Day    Types: E-cigarettes   Smokeless tobacco: Never  Vaping Use   Vaping Use: Every day   Substances: Flavoring  Substance Use Topics   Alcohol use: Yes    Alcohol/week: 3.0 standard drinks    Types: 3 Shots of liquor per week    Comment: 1x/wk   Drug use: No     Allergies   Patient has no known allergies.   Review of Systems Review of Systems Per HPI  Physical Exam Triage Vital Signs ED Triage Vitals  Enc Vitals Group  BP 06/24/21 1345 123/84     Pulse Rate 06/24/21 1345 84     Resp 06/24/21 1345 14     Temp 06/24/21 1345 98.6 F (37 C)     Temp Source 06/24/21 1345 Oral     SpO2 06/24/21 1345 100 %     Weight 06/24/21 1342 175 lb (79.4 kg)     Height 06/24/21 1342 5\' 5"  (1.651 m)     Head Circumference --      Peak Flow --      Pain Score 06/24/21 1341 4     Pain Loc --      Pain Edu? --      Excl. in GC? --    Updated Vital Signs BP 123/84 (BP Location: Right Arm)   Pulse 84   Temp 98.6 F (37 C) (Oral)   Resp 14   Ht 5\' 5"  (1.651 m)   Wt 79.4 kg   LMP 05/26/2021 (Approximate)   SpO2 100%   BMI 29.12 kg/m   Visual Acuity Right Eye Distance:   Left Eye Distance:   Bilateral Distance:    Right Eye Near:   Left Eye Near:    Bilateral Near:     Physical Exam Vitals and nursing note reviewed. Exam conducted with a chaperone present.  Constitutional:      General: She is not in acute distress.    Appearance: Normal appearance. She is not ill-appearing.  HENT:     Head: Normocephalic and atraumatic.  Eyes:     General:        Right eye: No  discharge.        Left eye: No discharge.     Conjunctiva/sclera: Conjunctivae normal.  Cardiovascular:     Rate and Rhythm: Normal rate and regular rhythm.  Pulmonary:     Effort: Pulmonary effort is normal.     Breath sounds: Normal breath sounds.  Genitourinary:    Comments: Thin, clear to white discharge noted on exam.  No appreciable vaginal lesions. Neurological:     Mental Status: She is alert.  Psychiatric:        Mood and Affect: Mood normal.        Behavior: Behavior normal.     UC Treatments / Results  Labs (all labs ordered are listed, but only abnormal results are displayed) Labs Reviewed  WET PREP, GENITAL - Abnormal; Notable for the following components:      Result Value   WBC, Wet Prep HPF POC FEW (*)    All other components within normal limits  URINALYSIS, COMPLETE (UACMP) WITH MICROSCOPIC - Abnormal; Notable for the following components:   Bacteria, UA FEW (*)    All other components within normal limits  CHLAMYDIA/NGC RT PCR [facility] ONLY)              EKG   Radiology No results found.  Procedures Procedures (including critical care time)  Medications Ordered in UC Medications - No data to display  Initial Impression / Assessment and Plan / UC Course  I have reviewed the triage vital signs and the nursing notes.  Pertinent labs & imaging results that were available during my care of the patient were reviewed by me and considered in my medical decision making (see chart for details).    30 year old female presents with vaginal irritation.  Vaginal discharge noted on exam.  Her wet prep and urinalysis was negative today.  Based on clinical exam, I am concerned that  this is bacterial vaginosis and was just not captured with a swab.  Treating with Flagyl.  Diflucan to be used if she develops signs/symptoms of yeast vaginitis.  Final Clinical Impressions(s) / UC Diagnoses   Final diagnoses:  Vaginal irritation     Discharge Instructions       Medication as prescribed.  Take care  Dr. Adriana Simas    ED Prescriptions     Medication Sig Dispense Auth. Provider   metroNIDAZOLE (FLAGYL) 500 MG tablet Take 1 tablet (500 mg total) by mouth 2 (two) times daily. 14 tablet Rylynn Kobs G, DO   fluconazole (DIFLUCAN) 150 MG tablet Take 1 tablet (150 mg total) by mouth once for 1 dose. Repeat dose in 72 hours. 2 tablet Tommie Sams, DO      PDMP not reviewed this encounter.   Tommie Sams, [place] 06/24/21 1630

## 2021-07-18 ENCOUNTER — Encounter: Payer: Self-pay | Admitting: Physician Assistant

## 2021-07-18 ENCOUNTER — Ambulatory Visit: Payer: Medicaid Other | Admitting: Physician Assistant

## 2021-07-18 ENCOUNTER — Other Ambulatory Visit: Payer: Self-pay

## 2021-07-18 DIAGNOSIS — Z113 Encounter for screening for infections with a predominantly sexual mode of transmission: Secondary | ICD-10-CM | POA: Diagnosis not present

## 2021-07-18 DIAGNOSIS — Z299 Encounter for prophylactic measures, unspecified: Secondary | ICD-10-CM

## 2021-07-18 LAB — WET PREP FOR TRICH, YEAST, CLUE
Trichomonas Exam: NEGATIVE
Yeast Exam: NEGATIVE

## 2021-07-18 MED ORDER — CLOTRIMAZOLE 1 % VA CREA: 1.0000 | TOPICAL_CREAM | Freq: Every day | VAGINAL | 0 refills | Status: AC

## 2021-07-18 NOTE — Progress Notes (Signed)
[facility] Department STI clinic/screening visit  Subjective:  Jody Perez is a 30 y.o. female being seen today for an STI screening visit. The patient reports they do have symptoms.  Patient reports that they do not desire a pregnancy in the next year.   They reported they are not interested in discussing contraception today.  No LMP recorded.   Patient has the following medical conditions:   Patient Active Problem List   Diagnosis Date Noted   Migraine without aura and without status migrainosus, not intractable 01/11/2021   Vapes nicotine containing substance 07/30/2020   Obesity BMI=30.4 04/07/2020   Recurrent vaginitis 06/06/2019   History of anemia 03/27/2018   History of genital HSV 01/28/2013    Chief Complaint  Patient presents with   SEXUALLY TRANSMITTED DISEASE    screening    HPI  Patient reports that she has had some vaginal irritation both internally and externally for 2 days.  Denies other symtpoms, chronic conditions, surgeries and regular medicines.  LMP was 06/24/2021 and using condoms sometimes as BCM.  States last HIV test was 2-4 weeks ago and last pap was12/2019.   See flowsheet for further details and programmatic requirements.    The following portions of the patient's history were reviewed and updated as appropriate: allergies, current medications, past medical history, past social history, past surgical history and problem list.  Objective:  There were no vitals filed for this visit.  Physical Exam Constitutional:      General: She is not in acute distress.    Appearance: Normal appearance.  HENT:     Head: Normocephalic and atraumatic.     Comments: No nits,lice, or hair loss. No cervical, supraclavicular or axillary adenopathy.     Mouth/Throat:     Mouth: Mucous membranes are moist.     Pharynx: Oropharynx is clear. No oropharyngeal exudate or posterior oropharyngeal erythema.  Eyes:     Conjunctiva/sclera: Conjunctivae  normal.  Pulmonary:     Effort: Pulmonary effort is normal.  Abdominal:     Palpations: Abdomen is soft. There is no mass.     Tenderness: There is no abdominal tenderness. There is no guarding or rebound.  Genitourinary:    General: Normal vulva.     Rectum: Normal.     Comments: External genitalia/pubic area without nits, lice, edema, erythema, lesions and inguinal adenopathy. Vagina with normal mucosa and discharge. Cervix without visible lesions. Uterus firm, mobile, nt, no masses, no CMT, no adnexal tenderness or fullness.  Musculoskeletal:     Cervical back: Neck supple. No tenderness.  Skin:    General: Skin is warm and dry.     Findings: No bruising, erythema, lesion or rash.  Neurological:     Mental Status: She is alert and oriented to person, place, and time.  Psychiatric:        Mood and Affect: Mood normal.        Behavior: Behavior normal.        Thought Content: Thought content normal.        Judgment: Judgment normal.     Assessment and Plan:  Jody Perez is a 30 y.o. female presenting to the [facility] Department for STI screening  1. Screening for STD (sexually transmitted disease) Patient into clinic with symptoms. Patient declines blood work today. Reviewed wet mount results. Rec condoms with all sex. Await test results.  Counseled that RN will call if needs to RTC for treatment once results are back.  -  WET PREP FOR TRICH, YEAST, CLUE - Gonococcus culture - Chlamydia/Gonorrhea [facility]  2. Prophylactic measure Will give Clotrimazole 1% vaginal cream for symptom relief while waiting for other results.  - clotrimazole (CLOTRIMAZOLE-7) 1 % vaginal cream; Place 1 Applicatorful vaginally at bedtime for 7 days.  Dispense: 45 g; Refill: 0     No follow-ups on file.  Future Appointments  Date Time Provider Department Center  07/20/2021  8:20 AM Sherrie Mustache, Demetrios Isaacs, MD [facility]    Matt Holmes, [place]

## 2021-07-18 NOTE — Progress Notes (Signed)
Wet mount reviewed with provider. Providers orders completed.  

## 2021-07-20 ENCOUNTER — Ambulatory Visit: Payer: Medicaid Other | Admitting: Family Medicine

## 2021-07-23 LAB — GONOCOCCUS CULTURE

## 2021-07-26 ENCOUNTER — Telehealth: Payer: Self-pay

## 2021-07-26 NOTE — Telephone Encounter (Signed)
Copied from CRM [id number]. Topic: General - Other >> Jul 26, 2021  8:09 AM Jaquita Rector A wrote: Reason for CRM: Patient called in to inform dr B that she is scheduled for surgery on 09/01/21 and need to have the clearance form in to the surgeon at least 15 days before her date of surgery. Would prefer something ASAP with any provider. Please advise and call Ph# [phone]

## 2021-08-04 ENCOUNTER — Encounter: Payer: Self-pay | Admitting: Family Medicine

## 2021-08-04 ENCOUNTER — Ambulatory Visit (INDEPENDENT_AMBULATORY_CARE_PROVIDER_SITE_OTHER): Payer: Medicaid Other | Admitting: Family Medicine

## 2021-08-04 ENCOUNTER — Other Ambulatory Visit: Payer: Self-pay

## 2021-08-04 VITALS — BP 117/71 | HR 90 | Ht 65.0 in | Wt 182.0 lb

## 2021-08-04 DIAGNOSIS — Z8619 Personal history of other infectious and parasitic diseases: Secondary | ICD-10-CM | POA: Diagnosis not present

## 2021-08-04 DIAGNOSIS — Z13228 Encounter for screening for other metabolic disorders: Secondary | ICD-10-CM | POA: Diagnosis not present

## 2021-08-04 DIAGNOSIS — Z1329 Encounter for screening for other suspected endocrine disorder: Secondary | ICD-10-CM | POA: Diagnosis not present

## 2021-08-04 DIAGNOSIS — Z862 Personal history of diseases of the blood and blood-forming organs and certain disorders involving the immune mechanism: Secondary | ICD-10-CM | POA: Diagnosis not present

## 2021-08-04 DIAGNOSIS — Z01818 Encounter for other preprocedural examination: Secondary | ICD-10-CM | POA: Diagnosis not present

## 2021-08-04 DIAGNOSIS — Z13220 Encounter for screening for lipoid disorders: Secondary | ICD-10-CM | POA: Diagnosis not present

## 2021-08-04 DIAGNOSIS — E669 Obesity, unspecified: Secondary | ICD-10-CM | POA: Diagnosis not present

## 2021-08-04 NOTE — Progress Notes (Signed)
Established patient visit   Patient: Jody Perez   DOB: Dec 30, 1990   30 y.o. Female  MRN: [id number] Visit Date: 08/04/2021  Today's healthcare provider: Dortha Kern, PA-C   Chief Complaint  Patient presents with   Medical Clearance   Subjective    HPI  Medical Clearance for Surgery scheduled for 09/01/2021. Originally set for 06-06-21 but had to be rescheduled due to scheduling issues at the [facility]  Past Medical History:  Diagnosis Date   BV (bacterial vaginosis)    Chlamydia    Herpes genitalis in women    HSV infection    Migraines    Miscarriage    Trichomonas    UTI (lower urinary tract infection)    Past Surgical History:  Procedure Laterality Date   WISDOM TOOTH EXTRACTION     Family History  Problem Relation Age of Onset   Hypertension Mother    Cancer Mother        Pitutary   Healthy Brother    Hypertension Brother    Healthy Father    Breast cancer Maternal Grandmother    Healthy Brother    Colon cancer Neg Hx    Ovarian cancer Neg Hx    Cervical cancer Neg Hx    No Known Allergies  Medications: Outpatient Medications Prior to Visit  Medication Sig   [DISCONTINUED] metroNIDAZOLE (FLAGYL) 500 MG tablet Take 1 tablet (500 mg total) by mouth 2 (two) times daily. (Patient not taking: Reported on 07/18/2021)   No facility-administered medications prior to visit.    Review of Systems  Constitutional: Negative.   Respiratory: Negative.    Cardiovascular: Negative.   Gastrointestinal: Negative.   Neurological:  Negative for dizziness, light-headedness and headaches.      Objective    BP 117/71 (BP Location: Right Arm, Patient Position: Sitting, Cuff Size: Large)   Pulse 90   Ht 5\' 5"  (1.651 m)   Wt 182 lb (82.6 kg)   LMP 07/06/2021   SpO2 100%   BMI 30.29 kg/m  LMP 07-06-21  Physical Exam Constitutional:      Appearance: She is well-developed.  HENT:     Head: Normocephalic and atraumatic.     Right Ear: External ear  normal.     Left Ear: External ear normal.     Nose: Nose normal.  Eyes:     General:        Right eye: No discharge.     Conjunctiva/sclera: Conjunctivae normal.     Pupils: Pupils are equal, round, and reactive to light.  Neck:     Thyroid: No thyromegaly.     Trachea: No tracheal deviation.  Cardiovascular:     Rate and Rhythm: Normal rate and regular rhythm.     Heart sounds: Normal heart sounds. No murmur heard. Pulmonary:     Effort: Pulmonary effort is normal. No respiratory distress.     Breath sounds: Normal breath sounds. No wheezing or rales.  Chest:     Chest wall: No tenderness.  Abdominal:     General: Bowel sounds are normal. There is no distension.     Palpations: Abdomen is soft. There is no mass.     Tenderness: There is no abdominal tenderness. There is no guarding or rebound.  Musculoskeletal:        General: No tenderness. Normal range of motion.     Cervical back: Normal range of motion and neck supple.  Lymphadenopathy:     Cervical: No  cervical adenopathy.  Skin:    General: Skin is warm and dry.     Findings: No erythema or rash.  Neurological:     Mental Status: She is alert and oriented to person, place, and time.     Cranial Nerves: No cranial nerve deficit.     Motor: No abnormal muscle tone.     Coordination: Coordination normal.     Deep Tendon Reflexes: Reflexes are normal and symmetric. Reflexes normal.  Psychiatric:        Behavior: Behavior normal.        Thought Content: Thought content normal.        Judgment: Judgment normal.      No results found for any visits on 08/04/21.  Assessment & Plan     1. Preop examination Patient has no absolute or relative contraindications to planned surgery and anesthesia including general anesthesia. She is at low risk for cardiac and pulmonary complications. - EKG 12-Lead - Comprehensive metabolic panel - CBC with Differential/Platelet - HIV Antibody (routine testing w rflx) - INR/PT -  APTT - hCG, serum, qualitative - HgB A1c - Thyroid Panel With TSH   No follow-ups on file.      I, Dennis Chrismon, PA-C, have reviewed all documentation for this visit. The documentation on 08/04/21 for the exam, diagnosis, procedures, and orders are all accurate and complete.    Dortha Kern, PA-C  [facility] [phone] (phone) [phone] (fax)  [facility]

## 2021-08-05 LAB — CBC WITH DIFFERENTIAL/PLATELET
Basophils Absolute: 0.1 10*3/uL (ref 0.0–0.2)
Basos: 1 %
EOS (ABSOLUTE): 0.3 10*3/uL (ref 0.0–0.4)
Eos: 5 %
Hematocrit: 38.9 % (ref 34.0–46.6)
Hemoglobin: 12.7 g/dL (ref 11.1–15.9)
Immature Grans (Abs): 0 10*3/uL (ref 0.0–0.1)
Immature Granulocytes: 0 %
Lymphocytes Absolute: 1.9 10*3/uL (ref 0.7–3.1)
Lymphs: 31 %
MCH: 30.2 pg (ref 26.6–33.0)
MCHC: 32.6 g/dL (ref 31.5–35.7)
MCV: 92 fL (ref 79–97)
Monocytes Absolute: 0.5 10*3/uL (ref 0.1–0.9)
Monocytes: 9 %
Neutrophils Absolute: 3.4 10*3/uL (ref 1.4–7.0)
Neutrophils: 54 %
Platelets: 295 10*3/uL (ref 150–450)
RBC: 4.21 x10E6/uL (ref 3.77–5.28)
RDW: 12 % (ref 11.7–15.4)
WBC: 6.1 10*3/uL (ref 3.4–10.8)

## 2021-08-05 LAB — COMPREHENSIVE METABOLIC PANEL
ALT: 9 IU/L (ref 0–32)
AST: 16 IU/L (ref 0–40)
Albumin/Globulin Ratio: 1.3 (ref 1.2–2.2)
Albumin: 4.1 g/dL (ref 3.9–5.0)
Alkaline Phosphatase: 73 IU/L (ref 44–121)
BUN/Creatinine Ratio: 10 (ref 9–23)
BUN: 9 mg/dL (ref 6–20)
Bilirubin Total: <0.2 mg/dL (ref 0.0–1.2)
CO2: 21 mmol/L (ref 20–29)
Calcium: 8.6 mg/dL — ABNORMAL LOW (ref 8.7–10.2)
Chloride: 103 mmol/L (ref 96–106)
Creatinine, Ser: 0.86 mg/dL (ref 0.57–1.00)
Globulin, Total: 3.2 g/dL (ref 1.5–4.5)
Glucose: 84 mg/dL (ref 65–99)
Potassium: 3.6 mmol/L (ref 3.5–5.2)
Sodium: 139 mmol/L (ref 134–144)
Total Protein: 7.3 g/dL (ref 6.0–8.5)
eGFR: 94 mL/min/{1.73_m2} (ref >59)

## 2021-08-05 LAB — PROTIME-INR
INR: 1 (ref 0.9–1.2)
Prothrombin Time: 10.9 s (ref 9.1–12.0)

## 2021-08-05 LAB — HIV ANTIBODY (ROUTINE TESTING W REFLEX): HIV Screen 4th Generation wRfx: NONREACTIVE

## 2021-08-05 LAB — HEMOGLOBIN A1C
Est. average glucose Bld gHb Est-mCnc: 100 mg/dL
Hgb A1c MFr Bld: 5.1 % (ref 4.8–5.6)

## 2021-08-05 LAB — THYROID PANEL WITH TSH
Free Thyroxine Index: 1.4 (ref 1.2–4.9)
T3 Uptake Ratio: 23 % — ABNORMAL LOW (ref 24–39)
T4, Total: 6 ug/dL (ref 4.5–12.0)
TSH: 0.752 u[IU]/mL (ref 0.450–4.500)

## 2021-08-05 LAB — APTT: aPTT: 29 s (ref 24–33)

## 2021-08-05 LAB — HCG, SERUM, QUALITATIVE: hCG,Beta Subunit,Qual,Serum: NEGATIVE m[IU]/mL (ref <6)

## 2021-08-08 ENCOUNTER — Telehealth: Payer: Self-pay

## 2021-08-08 NOTE — Telephone Encounter (Signed)
Copied from CRM [id number]. Topic: General - Other >> Aug 08, 2021  3:37 PM Aretta Nip wrote: Pt following up on A Medical Clearance that was faxed to [facility] today . She states that she was told this morning by the office that form had been faxed and they did not receive. Pt states that they have asked to be refaxed.

## 2021-08-09 NOTE — Telephone Encounter (Signed)
Be sure we used the correct fax number.

## 2021-08-09 NOTE — Telephone Encounter (Signed)
Pt came in office to discuss sending surgical clearance to [facility]. Advised pt that we didn't receive new clearance only clearance received was the one completed back in June 2022. Pt and I discussed contacting [facility] to get blank clearance. Pt agreed and agreed to me faxing her visit note from 08/04/21 to see if [facility] would accept the visit note as clearance. LM for [facility] to return call. Please contact Nichole if [facility] returns call. Arlina Robes

## 2021-08-11 ENCOUNTER — Ambulatory Visit: Payer: Medicaid Other | Admitting: Family Medicine

## 2021-08-15 ENCOUNTER — Ambulatory Visit: Payer: Medicaid Other | Admitting: Family Medicine

## 2021-08-24 ENCOUNTER — Ambulatory Visit: Payer: Medicaid Other | Admitting: Family Medicine

## 2021-08-28 DIAGNOSIS — Z20822 Contact with and (suspected) exposure to covid-19: Secondary | ICD-10-CM | POA: Diagnosis not present

## 2021-09-01 HISTORY — PX: LIPOSUCTION: SHX10

## 2021-09-12 ENCOUNTER — Ambulatory Visit: Payer: Self-pay | Admitting: *Deleted

## 2021-09-12 ENCOUNTER — Ambulatory Visit
Admission: EM | Admit: 2021-09-12 | Discharge: 2021-09-12 | Disposition: A | Discharge status: Home / Self Care | Payer: Medicaid Other

## 2021-09-12 ENCOUNTER — Other Ambulatory Visit: Payer: Self-pay

## 2021-09-12 DIAGNOSIS — R1902 Left upper quadrant abdominal swelling, mass and lump: Secondary | ICD-10-CM | POA: Diagnosis not present

## 2021-09-12 DIAGNOSIS — G8918 Other acute postprocedural pain: Secondary | ICD-10-CM | POA: Diagnosis not present

## 2021-09-12 NOTE — Telephone Encounter (Signed)
Pt called in on the [facility] line.   She had liposuction in [place], [place] on Sep 01, 2021 and is concerned because it looks like there is a pocket of fluid collecting on the left side of her abd.   No drainage or bleeding from her incision.   She was getting ready to go to the urgent care but called in to see if she should go to the hospital or urgent care.   [facility] her PCP is booked out so far when she tried to get an appt via MyChart.    I let her know an urgent care would be fine to have it evaluated for infection.   She was agreeable to this plan and is going on there now.

## 2021-09-12 NOTE — Discharge Instructions (Addendum)
As we discussed, we cannot properly evaluate your abdominal swelling and pain here in the urgent care.  Due to you being 11 days postop there is serious concern for postoperative infection versus fluid collection and this requires imaging and surgical evaluation.  Therefore, please go to [facility] emergency department for evaluation.

## 2021-09-12 NOTE — ED Triage Notes (Signed)
[place].  Yesterday developed swelling on left side of abdomen.  Mildly TTP.  No redness/drainage.  Is not near incision.  Denies fever at home.  Is not able to reach surgeon. Is still on a postop abx, unsure of name.

## 2021-09-12 NOTE — ED Triage Notes (Signed)
Patient is being discharged from the Urgent Care and sent to the Emergency Department via private vehicle . Per Wyvonnia Dusky NP, patient is in need of higher level of care due to need for diagnostic imaging and eval. Patient is aware and verbalizes understanding of plan of care.  Vitals:   09/12/21 1117  BP: 122/82  Pulse: 98  Resp: 18  Temp: 98.6 F (37 C)  SpO2: 99%

## 2021-09-12 NOTE — Telephone Encounter (Signed)
Reason for Disposition  Sounds like a serious complication to the triager    Go to the urgent care  Answer Assessment - Initial Assessment Questions 1. SYMPTOM: "What's the main symptom you're concerned about?" (e.g., pain, fever, vomiting)     Pt calling in she had liposuction 13 days ago in [place], [place].   She feels like fluid is on the side of her abd.    The liposuction around my back bra area and stomach area.   It feels like a lipoma.   My massage therapist said it would need to be drained with a syringe and needle. 2. ONSET: "When did fluid  start?"     Yesterday.   I took a picture yesterday and it looks like fluid on left side of stomach near my belly button above the belly button level. 3. SURGERY: "What surgery did you have?"     Sep 01, 2021 [place], [place].   4. DATE of SURGERY: "When was the surgery?"      Sep 01, 2021 5. ANESTHESIA: " What type of anesthesia did you have?" (e.g., general, spinal, epidural, local)     They put me asleep 6. PAIN: "Is there any pain?" If Yes, ask: "How bad is it?"  (Scale 1-10; or mild, moderate, severe)     A little tender. 7. FEVER: "Do you have a fever?" If Yes, ask: "What is your temperature, how was it measured, and when did it start?"     I'm having headaches for 3-4 days.   Tylenol not helping the headaches.   The headache comes back.   I have a history of migraines that last a couple of days.   This feels like a normal headache not a migraine. 8. VOMITING: "Is there any vomiting?" If Yes, ask: "How many times?"     No vomiting or diarrhea.    I didn't know if I should come to the hospital or not.   9. BLEEDING: "Is there any bleeding?" If Yes, ask: "How much?" and "Where?"     No bleeding or pus 10. OTHER SYMPTOMS: "Do you have any other symptoms?" (e.g., drainage from wound, painful urination, constipation)       My incisions closed.   No redness or drainage.  Protocols used: Post-Op Symptoms and Questions-A-AH

## 2021-09-12 NOTE — ED Provider Notes (Signed)
[facility]    CSN: [id number] Arrival date & time: 09/12/21  0941      History   Chief Complaint Chief Complaint  Patient presents with   Post-op Problem    HPI Jody Perez is a 30 y.o. female.   HPI  30 year old female here for evaluation of abdominal complaints.  Patient reports that she received liposuction in [place] on 09/01/2021 and then yesterday she developed swelling and tenderness to the left upper side of her abdomen.  She denies any redness or drainage and it is not near her incision site.  She has not had any fever.  She has tried to reach her surgeon in [place] but she cannot get a hold of him.  She is taking her postoperative antibiotics but she is unsure what the name is.  She reports that she had a 24-hour postop follow-up with her surgeon in [place] prior to returning home and does not have any further follow-up visit scheduled.  She contacted her primary care office this morning and they advised her to come to urgent care.  Past Medical History:  Diagnosis Date   BV (bacterial vaginosis)    Chlamydia    Herpes genitalis in women    HSV infection    Migraines    Miscarriage    Trichomonas    UTI (lower urinary tract infection)     Patient Active Problem List   Diagnosis Date Noted   Migraine without aura and without status migrainosus, not intractable 01/11/2021   Vapes nicotine containing substance 07/30/2020   Obesity BMI=30.4 04/07/2020   Recurrent vaginitis 06/06/2019   History of anemia 03/27/2018   History of genital HSV 01/28/2013    Past Surgical History:  Procedure Laterality Date   LIPOSUCTION  09/01/2021   WISDOM TOOTH EXTRACTION      OB History     Gravida  6   Para  3   Term  3   Preterm  0   AB  2   Living  3      SAB  2   IAB  0   Ectopic  0   Multiple  0   Live Births  3            Home Medications    Prior to Admission medications   Medication Sig Start Date End Date Taking?  Authorizing Provider  acetaminophen (TYLENOL) 325 MG tablet Take 650 mg by mouth every 6 (six) hours as needed.   Yes [provider]    Family History Family History  Problem Relation Age of Onset   Hypertension Mother    Cancer Mother        Pitutary   Healthy Brother    Hypertension Brother    Healthy Father    Breast cancer Maternal Grandmother    Healthy Brother    Colon cancer Neg Hx    Ovarian cancer Neg Hx    Cervical cancer Neg Hx     Social History Social History   Tobacco Use   Smoking status: Every Day    Types: E-cigarettes   Smokeless tobacco: Never  Vaping Use   Vaping Use: Every day   Substances: Flavoring  Substance Use Topics   Alcohol use: Yes    Alcohol/week: 3.0 standard drinks    Types: 3 Shots of liquor per week    Comment: 1x/wk   Drug use: No     Allergies   Patient has no known allergies.  Review of Systems Review of Systems  Constitutional:  Negative for activity change, appetite change and fever.  Gastrointestinal:  Positive for abdominal distention and abdominal pain. Negative for nausea and vomiting.  Skin:  Positive for color change.  Hematological: Negative.   Psychiatric/Behavioral: Negative.      Physical Exam Triage Vital Signs ED Triage Vitals  Enc Vitals Group     BP 09/12/21 1117 122/82     Pulse Rate 09/12/21 1117 98     Resp 09/12/21 1117 18     Temp 09/12/21 1117 98.6 F (37 C)     Temp Source 09/12/21 1117 Oral     SpO2 09/12/21 1117 99 %     Weight --      Height --      Head Circumference --      Peak Flow --      Pain Score 09/12/21 1120 0     Pain Loc --      Pain Edu? --      Excl. in GC? --    No data found.  Updated Vital Signs BP 122/82 (BP Location: Left Arm)   Pulse 98   Temp 98.6 F (37 C) (Oral)   Resp 18   LMP 09/05/2021   SpO2 99%   Visual Acuity Right Eye Distance:   Left Eye Distance:   Bilateral Distance:    Right Eye Near:   Left Eye Near:    Bilateral  Near:     Physical Exam Vitals and nursing note reviewed.  Constitutional:      General: She is not in acute distress.    Appearance: Normal appearance. She is not ill-appearing.  HENT:     Head: Normocephalic and atraumatic.  Abdominal:     General: There is distension.     Tenderness: There is abdominal tenderness. There is no guarding or rebound.  Skin:    General: Skin is warm and dry.     Capillary Refill: Capillary refill takes less than 2 seconds.  Neurological:     General: No focal deficit present.     Mental Status: She is alert and oriented to person, place, and time.  Psychiatric:        Mood and Affect: Mood normal.        Behavior: Behavior normal.        Thought Content: Thought content normal.        Judgment: Judgment normal.     UC Treatments / Results  Labs (all labs ordered are listed, but only abnormal results are displayed) Labs Reviewed - No data to display  EKG   Radiology No results found.  Procedures Procedures (including critical care time)  Medications Ordered in UC Medications - No data to display  Initial Impression / Assessment and Plan / UC Course  I have reviewed the triage vital signs and the nursing notes.  Pertinent labs & imaging results that were available during my care of the patient were reviewed by me and considered in my medical decision making (see chart for details).  Patient is a nontoxic-appearing 30 year old female here for evaluation of swelling to her abdomen 11 days post liposuction.  Her surgery was performed in [place] and she has been unable to reach her Careers adviser.  Her primary care office advised her to come to the urgent care for evaluation.  Patient is wearing an abdominal binder and she states that all of her incisions are closed and the swelling is nowhere near  any of her incisions.  She states that it was quite tender last night with a 5-6/10 and this required her to change from the postoperative  compression garment she was given by the surgeon to the one she is wearing now.  Patient remove the abdominal binder to reveal swelling to the left upper quadrant of her abdomen that is hot and tender to touch.  Patient's abdomen is taut with mild tenderness to palpation.  I advised the patient that she is 11 days postop and she may be dealing with an anterior abdominal postoperative infection versus fluid collection or seroma and she needs imaging and evaluation by surgery, neither of which we can provide her here.  Patient referred to the emergency department at [facility].  She left ambulatory in stable condition.   Final Clinical Impressions(s) / UC Diagnoses   Final diagnoses:  Post-op pain  Left upper quadrant abdominal swelling, mass and lump     Discharge Instructions      As we discussed, we cannot properly evaluate your abdominal swelling and pain here in the urgent care.  Due to you being 11 days postop there is serious concern for postoperative infection versus fluid collection and this requires imaging and surgical evaluation.  Therefore, please go to [facility] emergency department for evaluation.     ED Prescriptions   None    PDMP not reviewed this encounter.   Becky Augusta, NP 09/12/21 1221

## 2021-10-21 DIAGNOSIS — R0781 Pleurodynia: Secondary | ICD-10-CM | POA: Diagnosis not present

## 2021-10-21 DIAGNOSIS — M25551 Pain in right hip: Secondary | ICD-10-CM | POA: Diagnosis not present

## 2021-10-24 ENCOUNTER — Telehealth: Payer: Self-pay

## 2021-10-24 NOTE — Telephone Encounter (Signed)
Transition Care Management Follow-up Telephone Call Date of discharge and from where: 10/21/2021 from [facility]  How have you been since you were released from the hospital? Pt stated that she is feeling sore but overall she is doing well.  Any questions or concerns? No  Items Reviewed: Did the pt receive and understand the discharge instructions provided? Yes  Medications obtained and verified? Yes  Other? No  Any new allergies since your discharge? No  Dietary orders reviewed? No Do you have support at home? Yes   Functional Questionnaire: (I = Independent and D = Dependent) ADLs: I  Bathing/Dressing- I  Meal Prep- I  Eating- I  Maintaining continence- I  Transferring/Ambulation- I  Managing Meds- I   Follow up appointments reviewed:  PCP Hospital f/u appt confirmed? No  Pt will call PCP is symptoms persist Specialist Hospital f/u appt confirmed? No   Are transportation arrangements needed? No  If their condition worsens, is the pt aware to call PCP or go to the Emergency Dept.? Yes Was the patient provided with contact information for the PCP's office or ED? Yes Was to pt encouraged to call back with questions or concerns? Yes

## 2021-11-10 ENCOUNTER — Other Ambulatory Visit: Payer: Self-pay

## 2021-11-10 ENCOUNTER — Ambulatory Visit: Payer: Medicaid Other | Admitting: Nurse Practitioner

## 2021-11-10 ENCOUNTER — Encounter: Payer: Self-pay | Admitting: Family Medicine

## 2021-11-10 ENCOUNTER — Telehealth: Payer: Self-pay

## 2021-11-10 DIAGNOSIS — Z113 Encounter for screening for infections with a predominantly sexual mode of transmission: Secondary | ICD-10-CM | POA: Diagnosis not present

## 2021-11-10 LAB — WET PREP FOR TRICH, YEAST, CLUE
Trichomonas Exam: NEGATIVE
Yeast Exam: NEGATIVE

## 2021-11-10 NOTE — Progress Notes (Signed)
[facility] Department  STI clinic/screening visit [address] [place] [address] [phone]  Subjective:  Jody Perez is a 30 y.o. female being seen today for an STI screening visit. The patient reports they do have symptoms.  Patient reports that they do not desire a pregnancy in the next year.   They reported they are interested in discussing contraception today.    Patient's last menstrual period was 10/11/2021 (approximate).   Patient has the following medical conditions:   Patient Active Problem List   Diagnosis Date Noted   Migraine without aura and without status migrainosus, not intractable 01/11/2021   Vapes nicotine containing substance 07/30/2020   Obesity BMI=30.4 04/07/2020   Recurrent vaginitis 06/06/2019   History of anemia 03/27/2018   History of genital HSV 01/28/2013    Chief Complaint  Patient presents with   SEXUALLY TRANSMITTED DISEASE    BC    HPI  Patient reports to clinic desiring an STD screening.  Patient reports discharge for 2 days.  No other signs and symptoms reported.   Last HIV test per patient/review of record was 08/04/2021. Patient reports last pap was 11/12/2018.    Screening for MPX risk: Does the patient have an unexplained rash? No Is the patient MSM? No Does the patient endorse multiple sex partners or anonymous sex partners? No Did the patient have close or sexual contact with a person diagnosed with MPX? No Has the patient traveled outside the [place] where MPX is endemic? No Is there a high clinical suspicion for MPX-- evidenced by one of the following No  -Unlikely to be chickenpox  -Lymphadenopathy  -Rash that present in same phase of evolution on any given body part See flowsheet for further details and programmatic requirements.    The following portions of the patient's history were reviewed and updated as appropriate: allergies, current medications, past medical history, past social history,  past surgical history and problem list.  Objective:  There were no vitals filed for this visit.  Physical Exam Constitutional:      Appearance: Normal appearance.  HENT:     Head: Normocephalic and atraumatic.     Mouth/Throat:     Comments: No visible dental caries noted.  Patient has a few teeth missing.  Pulmonary:     Effort: Pulmonary effort is normal.  Abdominal:     General: Abdomen is flat.     Palpations: Abdomen is soft.  Genitourinary:    General: Normal vulva.     Rectum: Normal.     Comments: External genitalia/pubic area without nits, lice, edema, erythema, lesions and inguinal adenopathy. Vagina with normal mucosa and discharge. Cervix without visible lesions. Uterus firm, mobile, nt, no masses, no CMT, no adnexal tenderness or fullness. pH 4.5 Musculoskeletal:     Cervical back: Full passive range of motion without pain, normal range of motion and neck supple.  Lymphadenopathy:     Cervical: No cervical adenopathy.  Skin:    General: Skin is warm and dry.  Neurological:     Mental Status: She is alert and oriented to person, place, and time.  Psychiatric:        Mood and Affect: Mood normal.        Behavior: Behavior normal. Behavior is cooperative.     Assessment and Plan:  Jody Perez is a 30 y.o. female presenting to the [facility] Department for STI screening  1. Screening examination for venereal disease -29 year old female in clinic  today for STD screening.   -Patient states that she also would like to start on birth control pills.  Last physical was 04/2020. Patient has not been on birth control since 12/2020.  Reports having sex daily, last period 10/11/2021.  Last sex 11/09/2021 without a condom.  Recommended no sex for two weeks and schedule an appointment for a physical.  Also encouraged a pregnancy test, if no period next week.    Patient accepted all screenings including oral, vaginal CT/GC and bloodwork for HIV/RPR.  Patient meets  criteria for HepB screening? No. Ordered? No - low risk Patient meets criteria for HepC screening? No. Ordered? No - low risk   Treat wet prep per standing order Discussed time line for [facility] results and that patient will be called with positive results and encouraged patient to call if she had not heard in 2 weeks.  Counseled to return or seek care for continued or worsening symptoms Recommended condom use with all sex  Patient is not currently using contraception to prevent pregnancy.   - WET PREP FOR TRICH, YEAST, CLUE - Gonococcus culture - Chlamydia/Gonorrhea [facility] - HIV [facility] - Syphilis Serology, [facility]     Return if symptoms worsen or fail to improve.    Glenna Fellows, FNP

## 2021-11-10 NOTE — Progress Notes (Signed)
Pt here for STD screening.  Wet mount results reviewed, no treatment required.  Pt informed to make an appointment for a PE in 2 weeks.  Condoms declined. Berdie Ogren, RN

## 2021-11-14 LAB — GONOCOCCUS CULTURE

## 2021-11-14 NOTE — Telephone Encounter (Signed)
No additional notes

## 2021-12-14 ENCOUNTER — Emergency Department: Payer: Medicaid Other

## 2021-12-14 ENCOUNTER — Other Ambulatory Visit: Payer: Self-pay

## 2021-12-14 ENCOUNTER — Emergency Department
Admission: EM | Admit: 2021-12-14 | Discharge: 2021-12-14 | Disposition: A | Discharge status: Home / Self Care | Payer: Medicaid Other | Attending: Emergency Medicine | Admitting: Emergency Medicine

## 2021-12-14 ENCOUNTER — Encounter: Payer: Self-pay | Admitting: Emergency Medicine

## 2021-12-14 DIAGNOSIS — E876 Hypokalemia: Secondary | ICD-10-CM

## 2021-12-14 DIAGNOSIS — Z3A01 Less than 8 weeks gestation of pregnancy: Secondary | ICD-10-CM | POA: Diagnosis not present

## 2021-12-14 DIAGNOSIS — O209 Hemorrhage in early pregnancy, unspecified: Secondary | ICD-10-CM | POA: Diagnosis not present

## 2021-12-14 DIAGNOSIS — O26851 Spotting complicating pregnancy, first trimester: Secondary | ICD-10-CM | POA: Insufficient documentation

## 2021-12-14 DIAGNOSIS — N939 Abnormal uterine and vaginal bleeding, unspecified: Secondary | ICD-10-CM | POA: Diagnosis not present

## 2021-12-14 DIAGNOSIS — Z3A10 10 weeks gestation of pregnancy: Secondary | ICD-10-CM | POA: Insufficient documentation

## 2021-12-14 DIAGNOSIS — O99281 Endocrine, nutritional and metabolic diseases complicating pregnancy, first trimester: Secondary | ICD-10-CM | POA: Diagnosis not present

## 2021-12-14 DIAGNOSIS — O4690 Antepartum hemorrhage, unspecified, unspecified trimester: Secondary | ICD-10-CM

## 2021-12-14 LAB — URINALYSIS, COMPLETE (UACMP) WITH MICROSCOPIC
Bacteria, UA: NONE SEEN
Bilirubin Urine: NEGATIVE
Glucose, UA: NEGATIVE mg/dL
Ketones, ur: NEGATIVE mg/dL
Nitrite: NEGATIVE
Protein, ur: NEGATIVE mg/dL
Specific Gravity, Urine: 1.024 (ref 1.005–1.030)
pH: 6 (ref 5.0–8.0)

## 2021-12-14 LAB — HIV ANTIBODY (ROUTINE TESTING W REFLEX): HIV Screen 4th Generation wRfx: NONREACTIVE

## 2021-12-14 LAB — DIFFERENTIAL
Abs Immature Granulocytes: 0.02 10*3/uL (ref 0.00–0.07)
Basophils Absolute: 0 10*3/uL (ref 0.0–0.1)
Basophils Relative: 0 %
Eosinophils Absolute: 0.2 10*3/uL (ref 0.0–0.5)
Eosinophils Relative: 2 %
Immature Granulocytes: 0 %
Lymphocytes Relative: 20 %
Lymphs Abs: 2 10*3/uL (ref 0.7–4.0)
Monocytes Absolute: 0.3 10*3/uL (ref 0.1–1.0)
Monocytes Relative: 3 %
Neutro Abs: 7.6 10*3/uL (ref 1.7–7.7)
Neutrophils Relative %: 75 %

## 2021-12-14 LAB — BASIC METABOLIC PANEL
Anion gap: 6 (ref 5–15)
BUN: 9 mg/dL (ref 6–20)
CO2: 22 mmol/L (ref 22–32)
Calcium: 8.3 mg/dL — ABNORMAL LOW (ref 8.9–10.3)
Chloride: 102 mmol/L (ref 98–111)
Creatinine, Ser: 0.77 mg/dL (ref 0.44–1.00)
GFR, Estimated: >60 mL/min (ref >60)
Glucose, Bld: 82 mg/dL (ref 70–99)
Potassium: 3.2 mmol/L — ABNORMAL LOW (ref 3.5–5.1)
Sodium: 130 mmol/L — ABNORMAL LOW (ref 135–145)

## 2021-12-14 LAB — CBC
HCT: 34.2 % — ABNORMAL LOW (ref 36.0–46.0)
Hemoglobin: 10 g/dL — ABNORMAL LOW (ref 12.0–15.0)
MCH: 23.6 pg — ABNORMAL LOW (ref 26.0–34.0)
MCHC: 29.2 g/dL — ABNORMAL LOW (ref 30.0–36.0)
MCV: 80.9 fL (ref 80.0–100.0)
Platelets: 387 10*3/uL (ref 150–400)
RBC: 4.23 MIL/uL (ref 3.87–5.11)
RDW: 18.1 % — ABNORMAL HIGH (ref 11.5–15.5)
WBC: 10.2 10*3/uL (ref 4.0–10.5)
nRBC: 0 % (ref 0.0–0.2)

## 2021-12-14 LAB — POC URINE PREG, ED: Preg Test, Ur: POSITIVE — AB

## 2021-12-14 LAB — HEPATITIS B SURFACE ANTIGEN: Hepatitis B Surface Ag: NONREACTIVE

## 2021-12-14 LAB — ABO/RH: ABO/RH(D): O POS

## 2021-12-14 LAB — HCG, QUANTITATIVE, PREGNANCY: hCG, Beta Chain, Quant, S: 106671 m[IU]/mL — ABNORMAL HIGH (ref <5)

## 2021-12-14 IMAGING — US US OB COMP LESS 14 WK
1 series · 14 of 28 positions shown · non-contrast
Comparison: None.

CLINICAL DATA: Vaginal bleeding, uncertain LMP

EXAM:
OBSTETRIC <14 WK ULTRASOUND
TECHNIQUE: Transabdominal ultrasound was performed for evaluation of the
gestation as well as the maternal uterus and adnexal regions.

[Series 1: us ob comp + 14 wk · 14 of 62 slices shown]
[im 3/62]
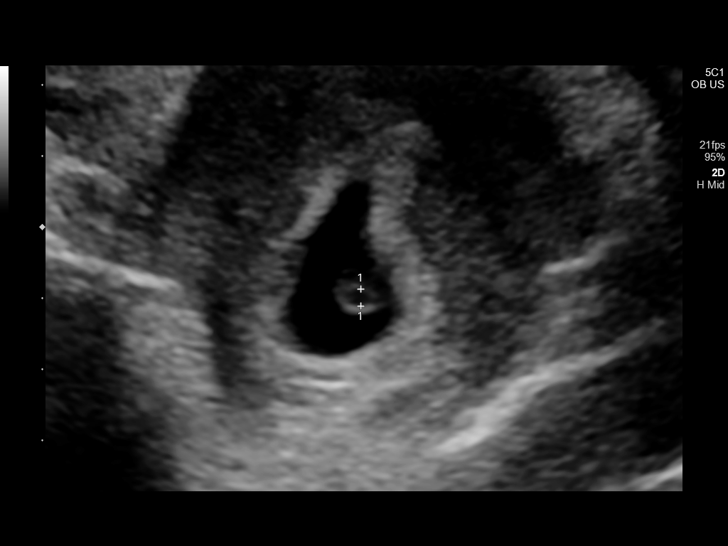
[im 7/62]
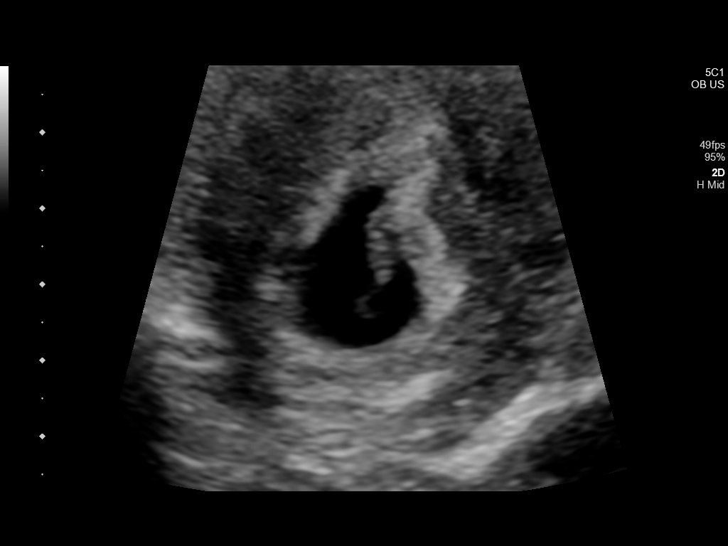
[im 12/62]
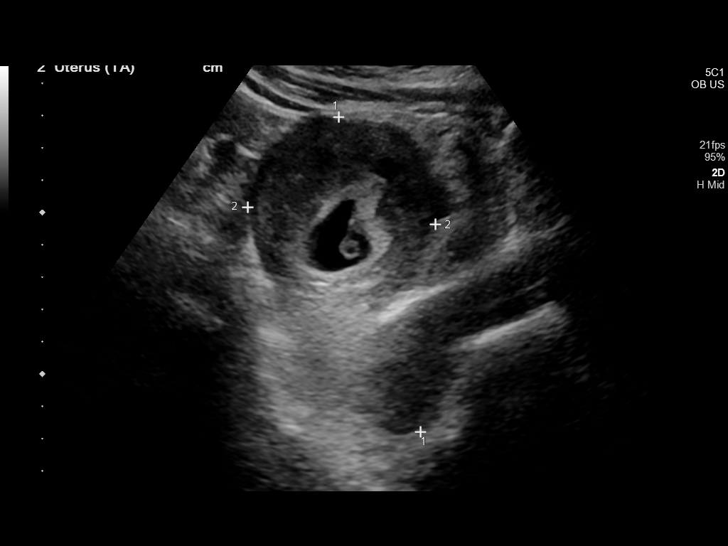
[im 16/62]
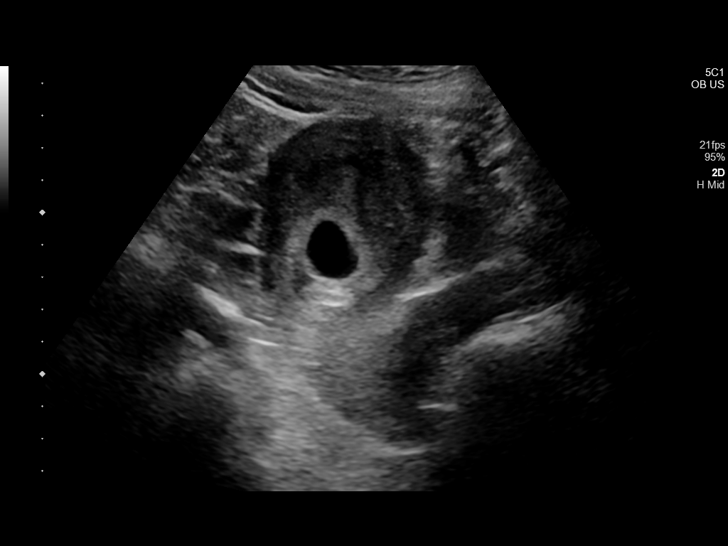
[im 21/62]
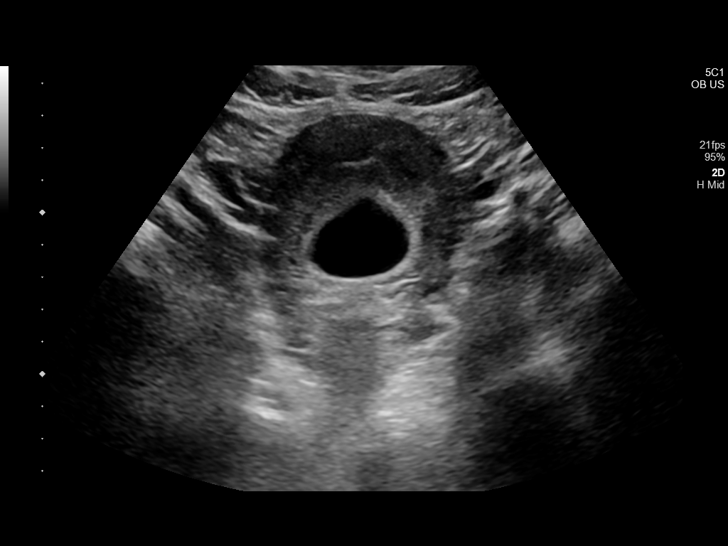
[im 25/62]
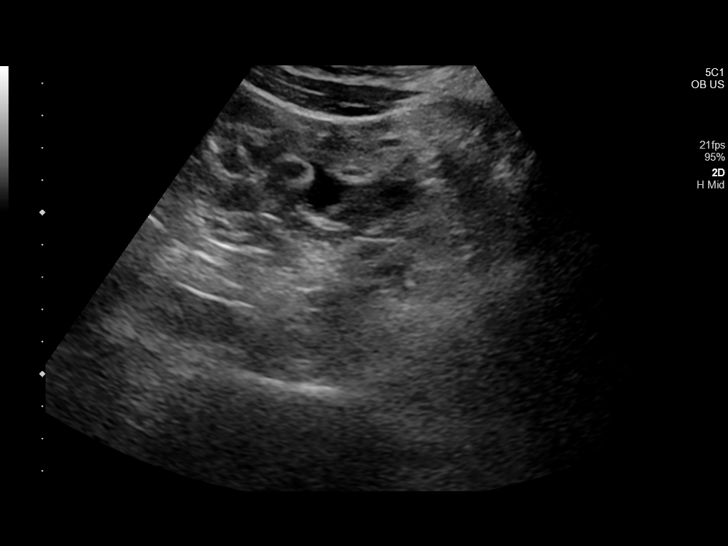
[im 30/62]
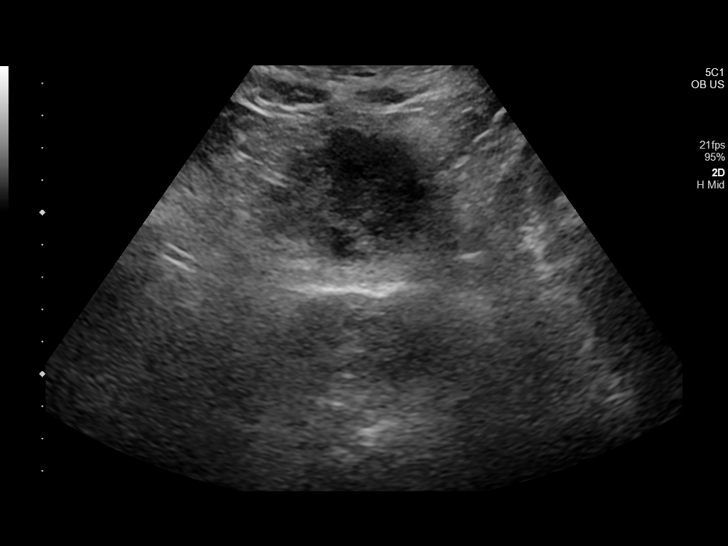
[im 34/62]
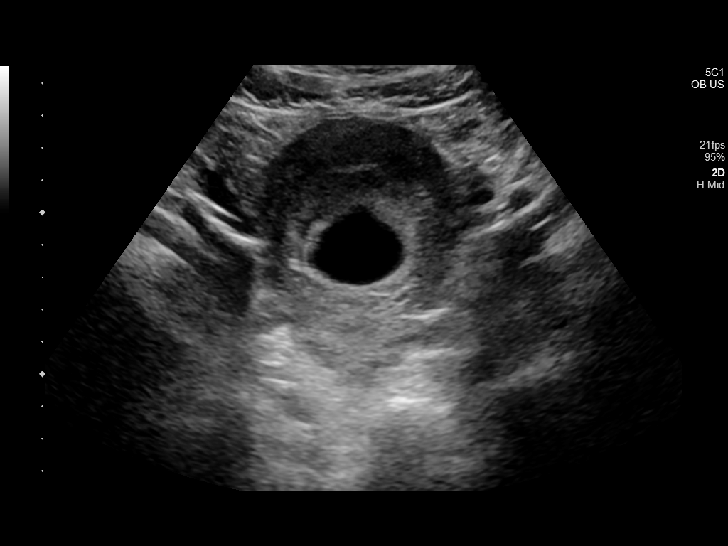
[im 39/62]
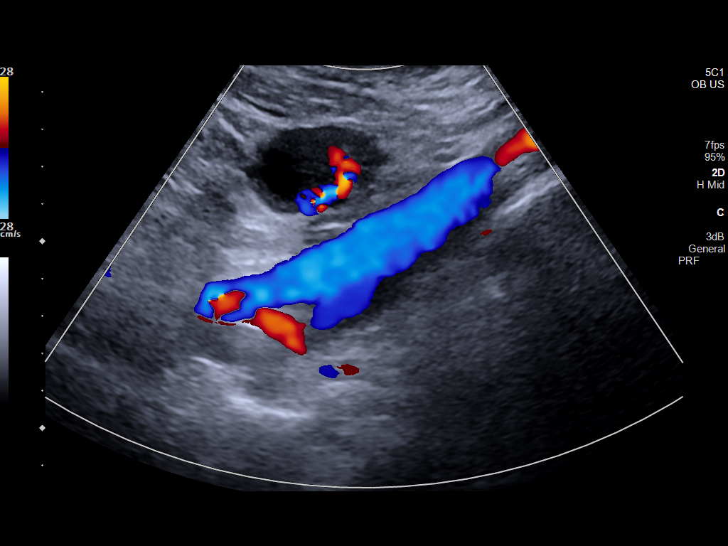
[im 43/62]
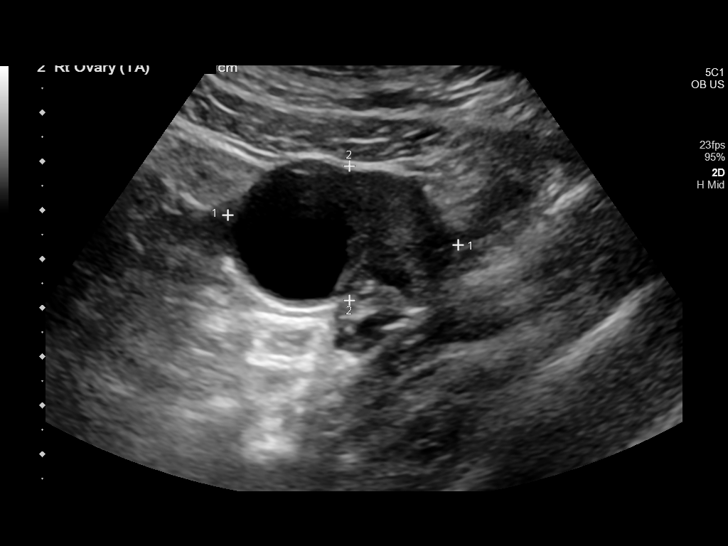
[im 48/62]
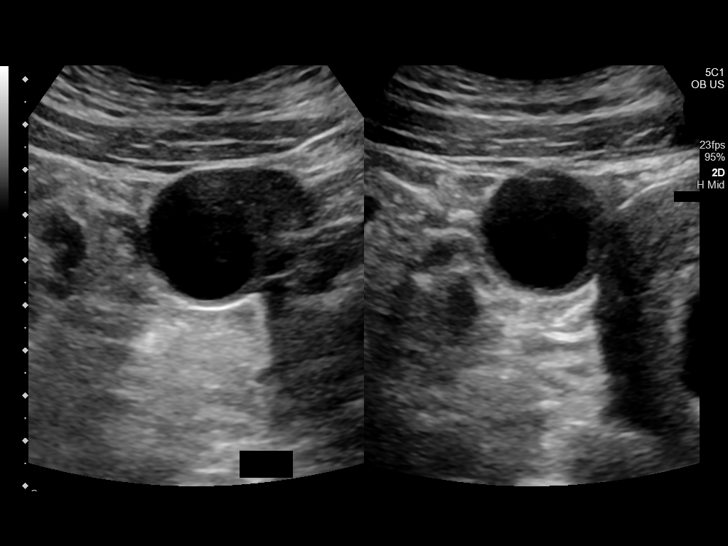
[im 52/62]
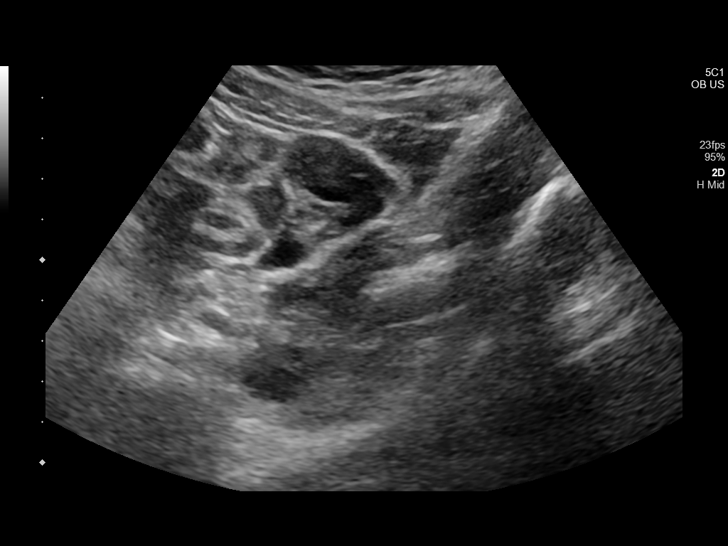
[im 57/62]
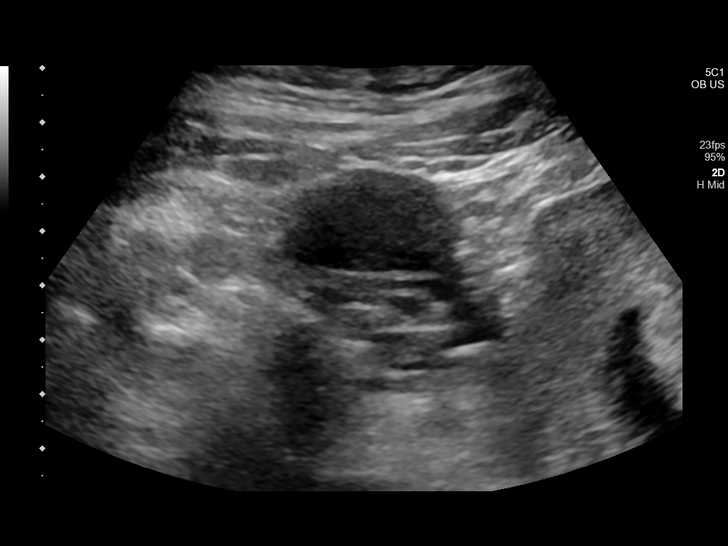
[im 62/62]
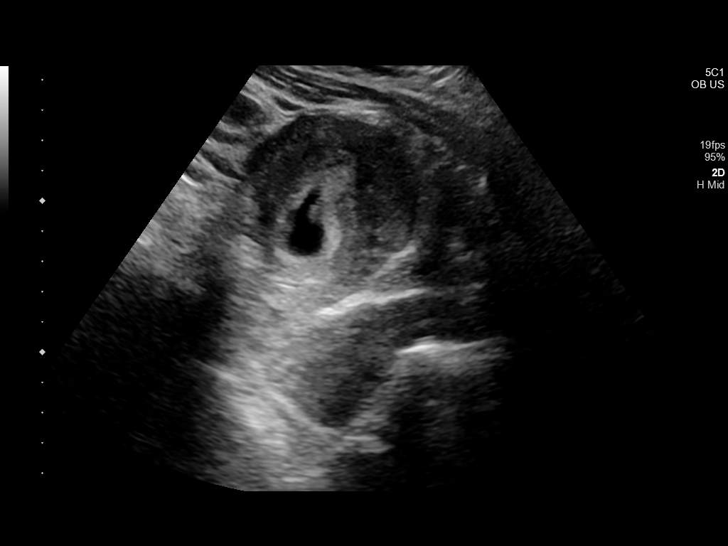

[14 of 28 positions shown; findings below may reference images not displayed]

FINDINGS: Intrauterine gestational sac: Single

Yolk sac:  Visualized.

Embryo:  Visualized.

Cardiac Activity: Visualized.

Heart Rate: 131 bpm

CRL:   10.0 mm   7 w 0 d                  US EDC: [DATE]

Subchorionic hemorrhage:  None visualized.

Maternal uterus/adnexae: Corpus luteum of the right ovary.
IMPRESSION: Single intrauterine gestation at sonographic gestational age 7
weeks, 0 days. Fetal heart rate 131 bpm. EDD [DATE].

## 2021-12-14 MED ORDER — ACETAMINOPHEN 500 MG PO TABS
1000.0000 mg | ORAL_TABLET | Freq: Once | ORAL | Status: AC
  Administered 2021-12-14: 1000 mg via ORAL
  Filled 2021-12-14: qty 2

## 2021-12-14 MED ORDER — POTASSIUM CHLORIDE CRYS ER 20 MEQ PO TBCR
40.0000 meq | EXTENDED_RELEASE_TABLET | Freq: Once | ORAL | Status: AC
  Administered 2021-12-14: 40 meq via ORAL
  Filled 2021-12-14: qty 2

## 2021-12-14 NOTE — ED Triage Notes (Signed)
Pt comes into the ED via POV c/o vaginal bleeding that started yesterday.  PT is pregnant but unsure how far along she is.  LMP was 09/04/21.  Pt in NAD.

## 2021-12-14 NOTE — ED Provider Notes (Signed)
[facility] Provider Note    Event Date/Time   First MD Initiated Contact with Patient 12/14/21 1231     (approximate)   History   Vaginal Bleeding   HPI  Jody Perez is a 31 y.o. female G6 approximately 10 weeks by last LMP without any prenatal care thus far for this pregnancy who presents for assessment of some vaginal bleeding and lower abdominal cramping that started yesterday.  Patient denies any injuries or falls, fevers, burning urination or other recent bleeding or abnormal discharge.  No headache, earache, sore throat, fevers, vomiting, diarrhea, rash or any other clear associated sick symptoms.  She denies EtOH use illicit drug use or tobacco abuse.  No other acute concerns at this time.      Physical Exam  Triage Vital Signs: ED Triage Vitals  Enc Vitals Group     BP 12/14/21 1140 127/73     Pulse Rate 12/14/21 1140 85     Resp 12/14/21 1140 18     Temp 12/14/21 1140 99.2 F (37.3 C)     Temp Source 12/14/21 1140 Oral     SpO2 12/14/21 1140 99 %     Weight 12/14/21 1138 182 lb 1.6 oz (82.6 kg)     Height 12/14/21 1138 5\' 5"  (1.651 m)     Head Circumference --      Peak Flow --      Pain Score 12/14/21 1138 0     Pain Loc --      Pain Edu? --      Excl. in [place]? --     Most recent vital signs: Vitals:   12/14/21 1140 12/14/21 1420  BP: 127/73 128/72  Pulse: 85 74  Resp: 18 18  Temp: 99.2 F (37.3 C) 99 F (37.2 C)  SpO2: 99% 99%    General: Awake, no distress.  CV:  Good peripheral perfusion.  Resp:  Normal effort.  Abd:  No distention.  Clearly gravid abdomen soft throughout   ED Results / Procedures / Treatments  Labs (all labs ordered are listed, but only abnormal results are displayed) Labs Reviewed  HCG, QUANTITATIVE, PREGNANCY - Abnormal; Notable for the following components:      Result Value   hCG, Beta Chain, Quant, S [id number] (*)    All other components within normal limits  URINALYSIS, COMPLETE (UACMP)  WITH MICROSCOPIC - Abnormal; Notable for the following components:   Color, Urine YELLOW (*)    APPearance CLOUDY (*)    Hgb urine dipstick LARGE (*)    Leukocytes,Ua TRACE (*)    All other components within normal limits  CBC - Abnormal; Notable for the following components:   Hemoglobin 10.0 (*)    HCT 34.2 (*)    MCH 23.6 (*)    MCHC 29.2 (*)    RDW 18.1 (*)    All other components within normal limits  BASIC METABOLIC PANEL - Abnormal; Notable for the following components:   Sodium 130 (*)    Potassium 3.2 (*)    Calcium 8.3 (*)    All other components within normal limits  POC URINE PREG, ED - Abnormal; Notable for the following components:   Preg Test, Ur POSITIVE (*)    All other components within normal limits  DIFFERENTIAL  HEPATITIS B SURFACE ANTIGEN  RUBELLA SCREEN  RPR  HIV ANTIBODY (ROUTINE TESTING W REFLEX)  ABO/RH     EKG    RADIOLOGY  Pelvic ultrasound reviewed by myself  shows single live IUP at 7 weeks by dates with a heart rate of 131.  No evidence of subchorionic hemorrhage.  Purposefully on the right ovary without any other adnexal abnormalities noted.   PROCEDURES:     MEDICATIONS ORDERED IN ED: Medications  acetaminophen (TYLENOL) tablet 1,000 mg (1,000 mg Oral Given 12/14/21 1320)  potassium chloride SA (KLOR-CON M) CR tablet 40 mEq (40 mEq Oral Given 12/14/21 1440)     IMPRESSION / MDM / ASSESSMENT AND PLAN / ED COURSE  I reviewed the triage vital signs and the nursing notes.                              Differential diagnosis includes, but is not limited to ectopic pregnancy, placenta previa, subchorionic hemorrhage, threatened miscarriage,  Pelvic ultrasound reviewed by myself shows single live IUP at 7 weeks by dates with a heart rate of 131.  No evidence of subchorionic hemorrhage.  Purposefully on the right ovary without any other adnexal abnormalities noted.  Hemoglobin is 10 compared to 12.74 months ago.  No leukocytosis and  platelets are normal.  I discussed this with patient recommendation to continue evaluation outpatient.  No indication for transfusion at this time.  hCG is 106,000.  UA without evidence of infection.  BMP remarkable for potassium of 3 without any other significant electrolyte or metabolic derangements.  Suspect possibly threatened threatened miscarriage.  Patient has no fever or leukocytosis or abnormal discharge or other findings to suggest PID at this time.  Offered pelvic exam to completely rule this out and further evaluate cervix although she states she prefers to get this done by her OB in the next 24 to 40 hours.  I think this is reasonable since a very low suspicion for acute infectious process.  Discharged in stable condition with instructions to follow-up with OB.  Rh is positive and there is no indication for RhoGAM at this time.      FINAL CLINICAL IMPRESSION(S) / ED DIAGNOSES   Final diagnoses:  Vaginal bleeding during pregnancy  Hypokalemia     Rx / DC Orders   ED Discharge Orders     None        Note:  This document was prepared using Dragon voice recognition software and may include unintentional dictation errors.   Lucrezia Starch, MD 12/14/21 1451

## 2021-12-14 NOTE — Discharge Instructions (Signed)
FINDINGS: Intrauterine gestational sac: Single   Yolk sac:  Visualized.   Embryo:  Visualized.   Cardiac Activity: Visualized.   Heart Rate: 131 bpm   CRL:   10.0 mm   7 w 0 d                  [place] EDC: 08/02/2022   Subchorionic hemorrhage:  None visualized.   Maternal uterus/adnexae: Corpus luteum of the right ovary.   IMPRESSION: Single intrauterine gestation at sonographic gestational age [redacted] weeks, 0 days. Fetal heart rate 131 bpm. EDD 08/02/2022.

## 2021-12-15 ENCOUNTER — Telehealth: Payer: Self-pay

## 2021-12-15 LAB — RPR: RPR Ser Ql: NONREACTIVE

## 2021-12-15 LAB — RUBELLA SCREEN: Rubella: 3.14 index (ref >0.99)

## 2021-12-15 NOTE — Telephone Encounter (Signed)
Transition Care Management Unsuccessful Follow-up Telephone Call  Date of discharge and from where:  12/14/2021 from [facility]  Attempts:  1st Attempt  Reason for unsuccessful TCM follow-up call:  Unable to leave message

## 2021-12-16 NOTE — Telephone Encounter (Signed)
Transition Care Management Unsuccessful Follow-up Telephone Call  Date of discharge and from where:  12/14/2021 from [facility]  Attempts:  2nd Attempt  Reason for unsuccessful TCM follow-up call:  Unable to leave message

## 2021-12-19 NOTE — Telephone Encounter (Signed)
Transition Care Management Unsuccessful Follow-up Telephone Call  Date of discharge and from where:  12/14/2021-[facility]  Attempts:  3rd Attempt  Reason for unsuccessful TCM follow-up call:  Unable to leave message

## 2022-01-01 ENCOUNTER — Other Ambulatory Visit: Payer: Self-pay

## 2022-01-01 ENCOUNTER — Emergency Department
Admission: EM | Admit: 2022-01-01 | Discharge: 2022-01-01 | Disposition: A | Discharge status: Home / Self Care | Payer: Medicaid Other | Attending: Emergency Medicine | Admitting: Emergency Medicine

## 2022-01-01 ENCOUNTER — Emergency Department: Payer: Medicaid Other

## 2022-01-01 DIAGNOSIS — O209 Hemorrhage in early pregnancy, unspecified: Secondary | ICD-10-CM | POA: Insufficient documentation

## 2022-01-01 DIAGNOSIS — N939 Abnormal uterine and vaginal bleeding, unspecified: Secondary | ICD-10-CM

## 2022-01-01 DIAGNOSIS — O26891 Other specified pregnancy related conditions, first trimester: Secondary | ICD-10-CM | POA: Insufficient documentation

## 2022-01-01 DIAGNOSIS — Z3A09 9 weeks gestation of pregnancy: Secondary | ICD-10-CM | POA: Insufficient documentation

## 2022-01-01 DIAGNOSIS — N9489 Other specified conditions associated with female genital organs and menstrual cycle: Secondary | ICD-10-CM | POA: Diagnosis not present

## 2022-01-01 DIAGNOSIS — R109 Unspecified abdominal pain: Secondary | ICD-10-CM | POA: Diagnosis not present

## 2022-01-01 DIAGNOSIS — N938 Other specified abnormal uterine and vaginal bleeding: Secondary | ICD-10-CM | POA: Insufficient documentation

## 2022-01-01 DIAGNOSIS — O039 Complete or unspecified spontaneous abortion without complication: Secondary | ICD-10-CM | POA: Diagnosis present

## 2022-01-01 LAB — HCG, QUANTITATIVE, PREGNANCY: hCG, Beta Chain, Quant, S: 183057 m[IU]/mL — ABNORMAL HIGH (ref <5)

## 2022-01-01 LAB — CBC
HCT: 33.4 % — ABNORMAL LOW (ref 36.0–46.0)
Hemoglobin: 10 g/dL — ABNORMAL LOW (ref 12.0–15.0)
MCH: 24.8 pg — ABNORMAL LOW (ref 26.0–34.0)
MCHC: 29.9 g/dL — ABNORMAL LOW (ref 30.0–36.0)
MCV: 82.9 fL (ref 80.0–100.0)
Platelets: 397 10*3/uL (ref 150–400)
RBC: 4.03 MIL/uL (ref 3.87–5.11)
RDW: 18.7 % — ABNORMAL HIGH (ref 11.5–15.5)
WBC: 12.5 10*3/uL — ABNORMAL HIGH (ref 4.0–10.5)
nRBC: 0 % (ref 0.0–0.2)

## 2022-01-01 LAB — BASIC METABOLIC PANEL
Anion gap: 6 (ref 5–15)
BUN: 7 mg/dL (ref 6–20)
CO2: 24 mmol/L (ref 22–32)
Calcium: 8.4 mg/dL — ABNORMAL LOW (ref 8.9–10.3)
Chloride: 102 mmol/L (ref 98–111)
Creatinine, Ser: 0.85 mg/dL (ref 0.44–1.00)
GFR, Estimated: >60 mL/min (ref >60)
Glucose, Bld: 97 mg/dL (ref 70–99)
Potassium: 3.4 mmol/L — ABNORMAL LOW (ref 3.5–5.1)
Sodium: 132 mmol/L — ABNORMAL LOW (ref 135–145)

## 2022-01-01 IMAGING — US US OB < 14 WEEKS - US OB TV
1 series · 14 of 28 positions shown · non-contrast
Comparison: [DATE]

CLINICAL DATA: Vaginal bleeding

EXAM:
OBSTETRIC <14 WK ULTRASOUND
TECHNIQUE: Transabdominal ultrasound was performed for evaluation of the
gestation as well as the maternal uterus and adnexal regions.
Transvaginal sonography was deferred by the patient.

[Series 1: us ob less than 14 weeks with ob transvaginal · 14 of 46 slices shown]
[im 2/46]
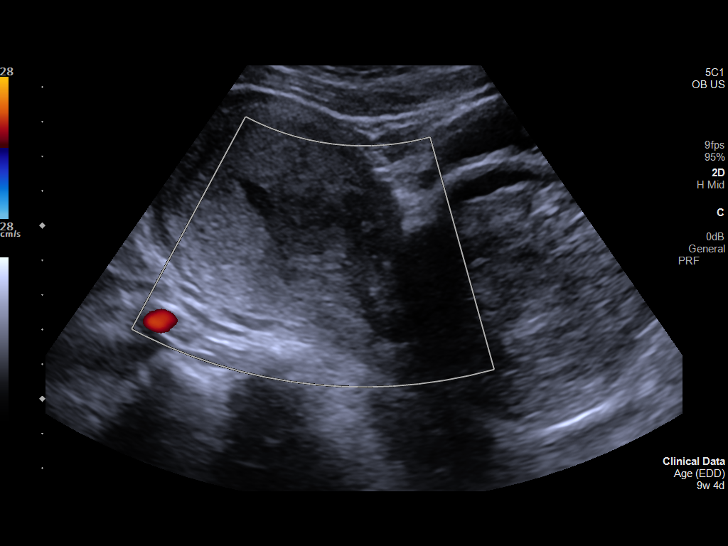
[im 6/46]
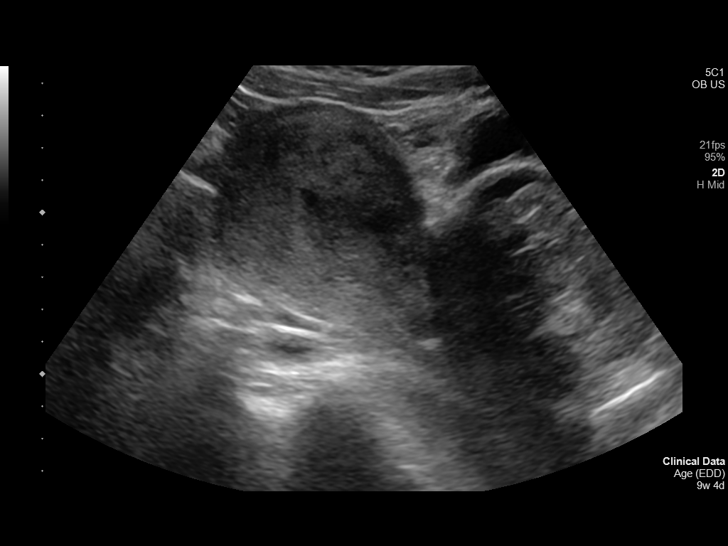
[im 9/46]
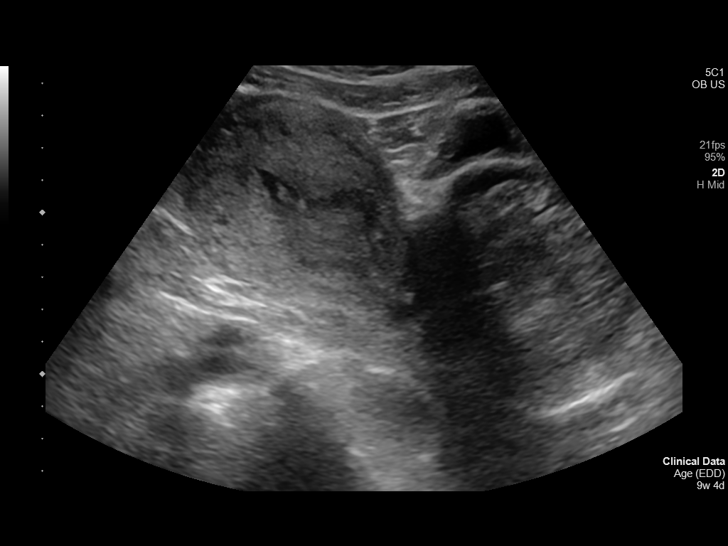
[im 12/46]
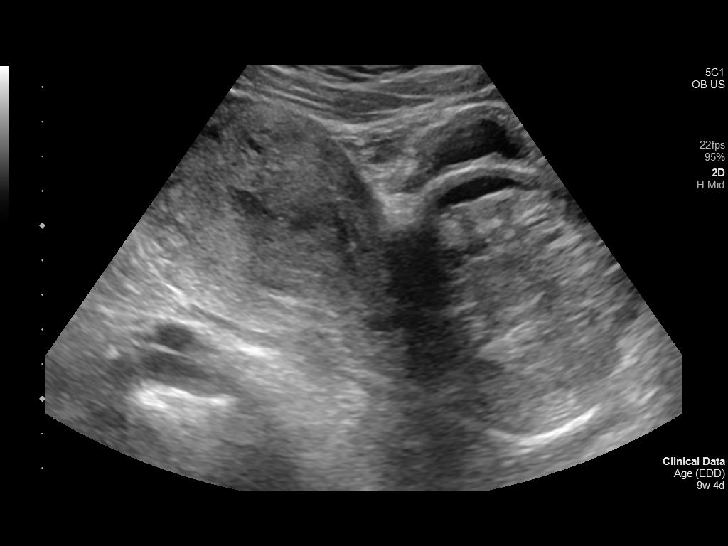
[im 16/46]
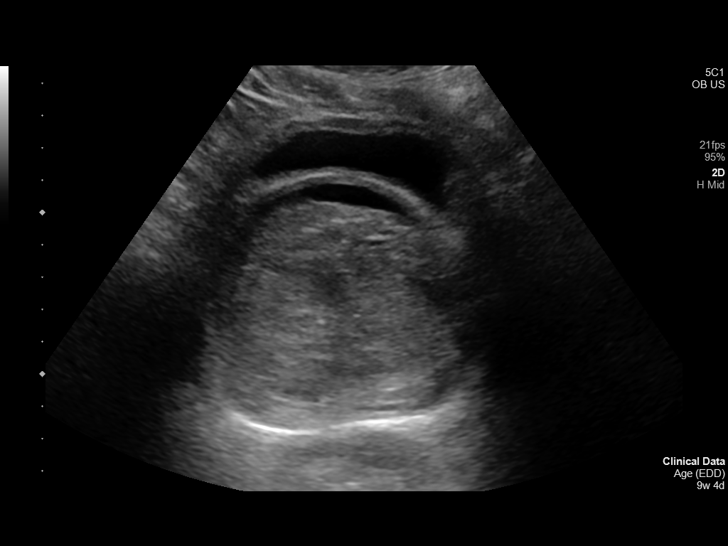
[im 19/46]
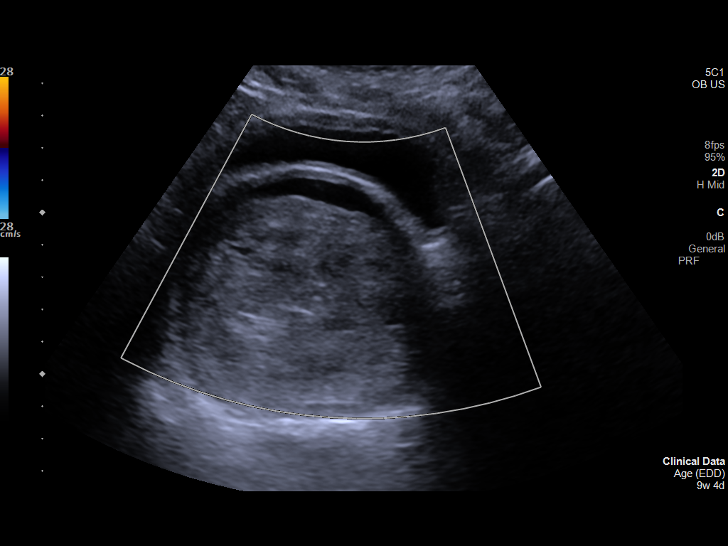
[im 22/46]
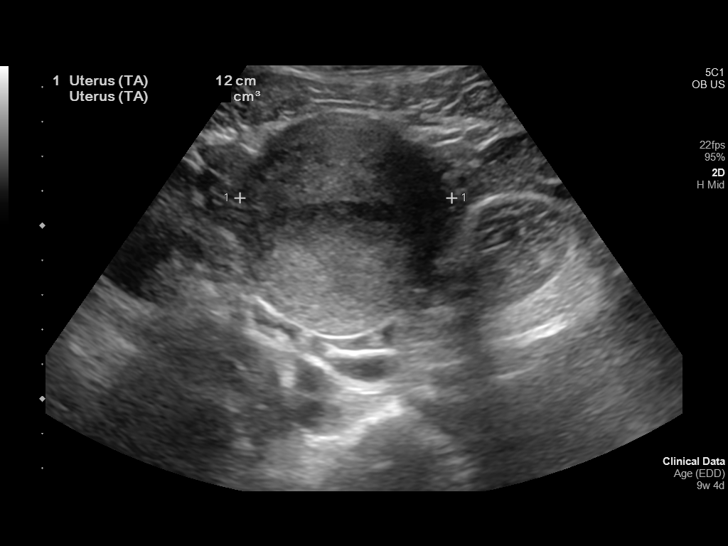
[im 26/46]
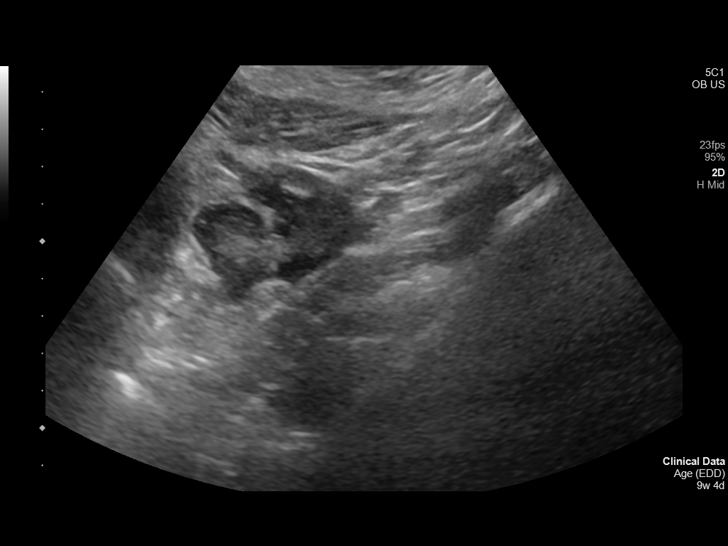
[im 29/46]
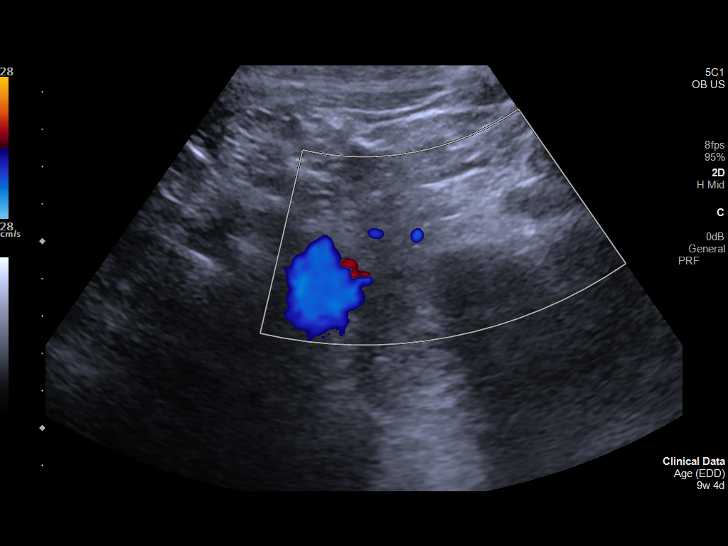
[im 32/46]
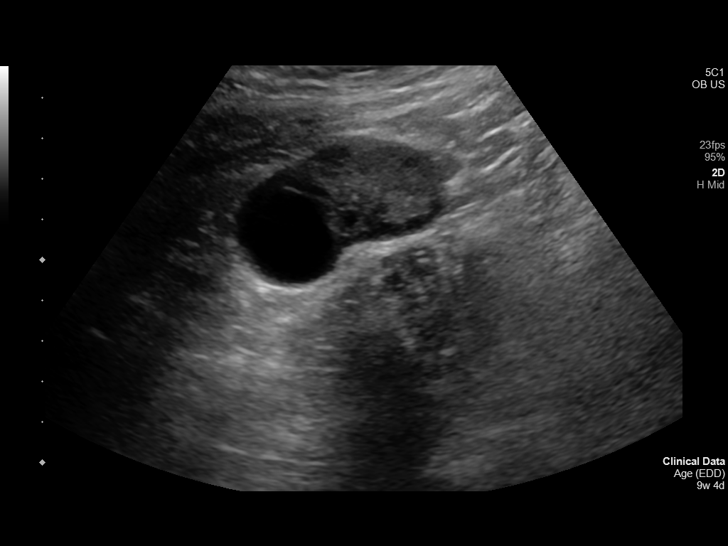
[im 36/46]
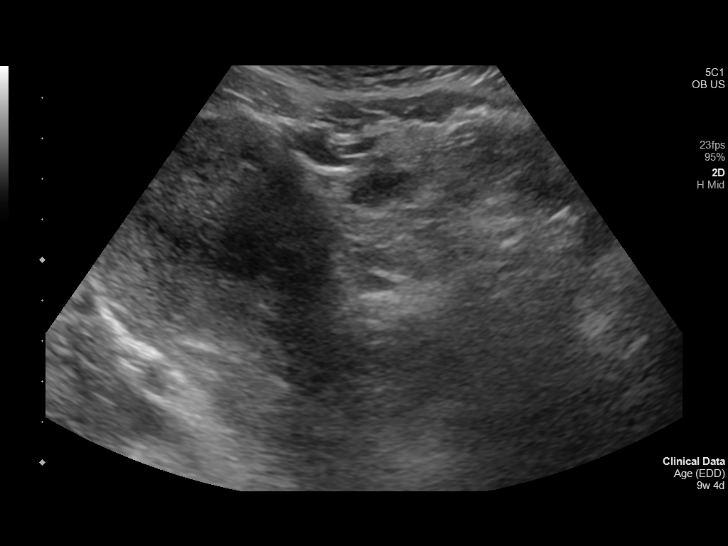
[im 39/46]
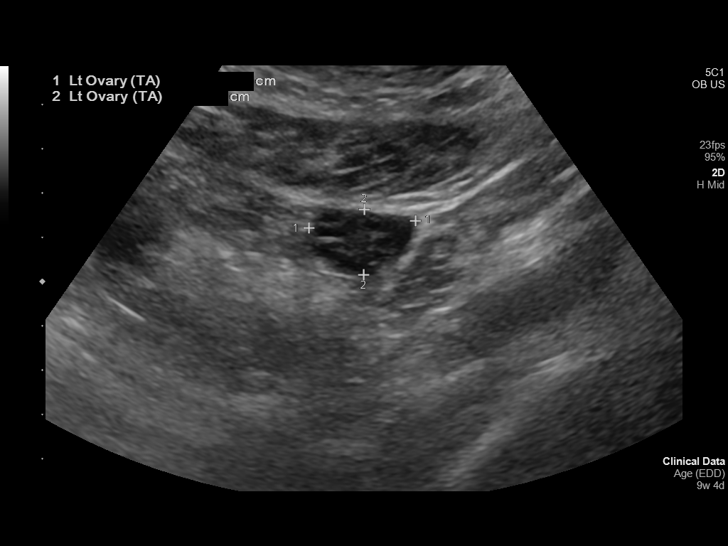
[im 42/46]
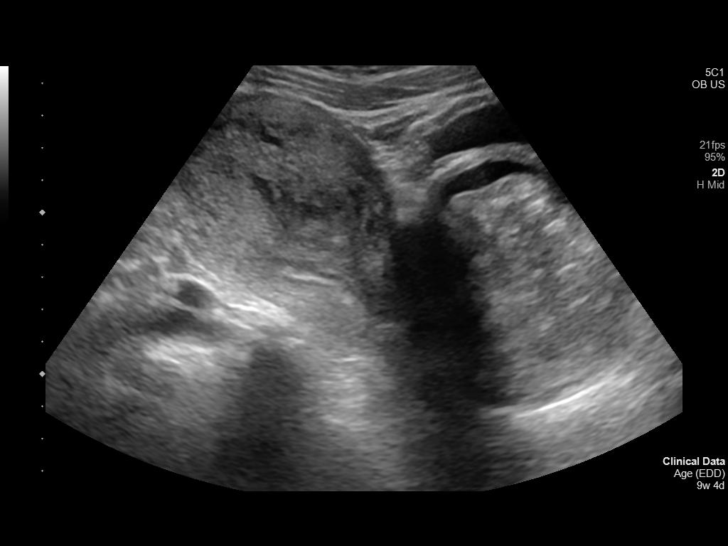
[im 46/46]
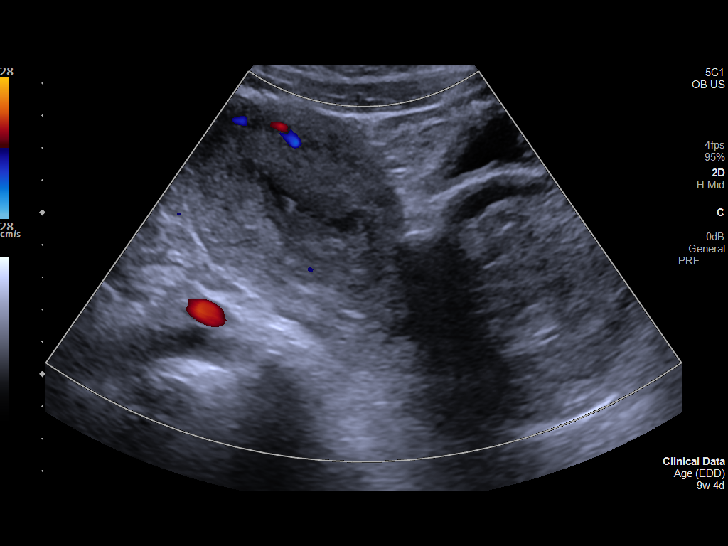

[14 of 28 positions shown; findings below may reference images not displayed]

FINDINGS: Intrauterine gestational sac: The previously identified intrauterine
gestational sac and fetal structures are no longer visualized.

Subchorionic hemorrhage:  None visualized.

Maternal uterus/adnexae: The uterus is anteverted. There is
extensive echogenic heterogeneous material within the endometrial
cavity demonstrating no significant internal vascularity most in
keeping with blood product and/or debris. Additionally, there
appears to be a large amount of expansile echogenic avascular
material within the vaginal canal proximally, not optimally
visualized on this examination. The maternal ovaries are
unremarkable. No free fluid within the cul-de-sac.
IMPRESSION: Abortion in progress. Extensive heterogeneous debris/blood product
within the endometrial cavity and within the a visualized proximal
vaginal canal.

## 2022-01-01 NOTE — ED Provider Notes (Signed)
[facility] Provider Note    Event Date/Time   First MD Initiated Contact with Patient 01/01/22 0130     (approximate)   History   Vaginal Bleeding and Abdominal Pain   HPI  Jody Perez is a 31 y.o. female who presents for evaluation of vaginal bleeding.  Patient reports that she is currently [redacted] weeks pregnant.  She was seen here on 12/14/2021 for vaginal bleeding.  Had an ultrasound that showed a normal IUP.  Since then patient reports that she has had mild spotting intermittently.  This evening she started having large amount of bleeding and passing large clots.  She was complaining of suprapubic cramping abdominal pain.  No dizziness.  She does not take any blood thinners.  No history of bleeding disorders.     Past Medical History:  Diagnosis Date   BV (bacterial vaginosis)    Chlamydia    Herpes genitalis in women    HSV infection    Migraines    Miscarriage    Trichomonas    UTI (lower urinary tract infection)     Past Surgical History:  Procedure Laterality Date   LIPOSUCTION  09/01/2021   WISDOM TOOTH EXTRACTION       Physical Exam   Triage Vital Signs: ED Triage Vitals [01/01/22 0131]  Enc Vitals Group     BP 131/85     Pulse Rate 96     Resp 20     Temp 99 F (37.2 C)     Temp Source Oral     SpO2 100 %     Weight 166 lb (75.3 kg)     Height 5\' 5"  (1.651 m)     Head Circumference      Peak Flow      Pain Score 8     Pain Loc      Pain Edu?      Excl. in GC?     Most recent vital signs: Vitals:   01/01/22 0200 01/01/22 0230  BP: 111/79 113/77  Pulse: 69 88  Resp:  18  Temp:    SpO2: 100% 100%     Constitutional: Alert and oriented. Well appearing and in no apparent distress. HEENT:      Head: Normocephalic and atraumatic.         Eyes: Conjunctivae are normal. Sclera is non-icteric.       Mouth/Throat: Mucous membranes are moist.       Neck: Supple with no signs of meningismus. Cardiovascular: Regular rate  and rhythm. No murmurs, gallops, or rubs. 2+ symmetrical distal pulses are present in all extremities.  Respiratory: Normal respiratory effort. Lungs are clear to auscultation bilaterally.  Gastrointestinal: Soft, tender to palpation in the suprapubic region, and non distended with positive bowel sounds. No rebound or guarding. Musculoskeletal:  No edema, cyanosis, or erythema of extremities. Neurologic: Normal speech and language. Face is symmetric. Moving all extremities. No gross focal neurologic deficits are appreciated. Skin: Skin is warm, dry and intact. No rash noted. Psychiatric: Mood and affect are normal. Speech and behavior are normal.  ED Results / Procedures / Treatments   Labs (all labs ordered are listed, but only abnormal results are displayed) Labs Reviewed  CBC - Abnormal; Notable for the following components:      Result Value   WBC 12.5 (*)    Hemoglobin 10.0 (*)    HCT 33.4 (*)    MCH 24.8 (*)    MCHC 29.9 (*)  RDW 18.7 (*)    All other components within normal limits  BASIC METABOLIC PANEL - Abnormal; Notable for the following components:   Sodium 132 (*)    Potassium 3.4 (*)    Calcium 8.4 (*)    All other components within normal limits  HCG, QUANTITATIVE, PREGNANCY - Abnormal; Notable for the following components:   hCG, Beta Chain, Quant, S 183,057 (*)    All other components within normal limits     EKG  none   RADIOLOGY I, Nita Sickle, attending MD, have personally viewed and interpreted the images obtained during this visit as below:  US showing no IUP   ___________________________________________________ Interpretation by Radiologist:  US OB LESS THAN 14 WEEKS WITH OB TRANSVAGINAL  Result Date: 01/01/2022 CLINICAL DATA:  Vaginal bleeding EXAM: OBSTETRIC <14 WK ULTRASOUND TECHNIQUE: Transabdominal ultrasound was performed for evaluation of the gestation as well as the maternal uterus and adnexal regions. Transvaginal sonography was  deferred by the patient. COMPARISON:  12/14/2021 FINDINGS: Intrauterine gestational sac: The previously identified intrauterine gestational sac and fetal structures are no longer visualized. Subchorionic hemorrhage:  None visualized. Maternal uterus/adnexae: The uterus is anteverted. There is extensive echogenic heterogeneous material within the endometrial cavity demonstrating no significant internal vascularity most in keeping with blood product and/or debris. Additionally, there appears to be a large amount of expansile echogenic avascular material within the vaginal canal proximally, not optimally visualized on this examination. The maternal ovaries are unremarkable. No free fluid within the cul-de-sac. IMPRESSION: Abortion in progress. Extensive heterogeneous debris/blood product within the endometrial cavity and within the a visualized proximal vaginal canal. Electronically Signed   By: Helyn Numbers M.D.   On: 01/01/2022 02:58      PROCEDURES:  Critical Care performed: No  Procedures    IMPRESSION / MDM / ASSESSMENT AND PLAN / ED COURSE  I reviewed the triage vital signs and the nursing notes.  31 y.o. female currently at [redacted] weeks GA who presents for evaluation of vaginal bleeding and suprapubic cramping.  Patient is well-appearing and hemodynamically stable with mild suprapubic tenderness.  Ddx: Threatened miscarriage versus subchorionic hemorrhage versus bleeding of first trimester   Plan: Bedside ultrasound, hCG, CBC, telemetry for monitoring of hemodynamics   MEDICATIONS GIVEN IN ED: Medications - No data to display   ED COURSE: Bedside ultrasound unable to visualize an IUP.  I did see a lot of fluid around the right adnexa therefore patient was sent for a formal ultrasound which showed ongoing spontaneous abortion.  Patient hemodynamically stable with stable hemoglobin of 10 with no indication for transfusion.  hCG 183K. Patient blood type is O+ and no indication for RhoGAM.  Discussed expectant management and f/u with OB.  No indication for admission at this time since patient is hemodynamically stable.  Discussed signs and symptoms of acute blood loss anemia recommended return to the hospital if those develop.   Consults: None   EMR reviewed including ultrasound from last month and last visit with GYN for STD screening from December 2022    FINAL CLINICAL IMPRESSION(S) / ED DIAGNOSES   Final diagnoses:  Vaginal bleeding  Spontaneous abortion     Rx / DC Orders   ED Discharge Orders     None        Note:  This document was prepared using Dragon voice recognition software and may include unintentional dictation errors.   Please note:  Patient was evaluated in Emergency Department today for the symptoms described in the history  of present illness. Patient was evaluated in the context of the global COVID-19 pandemic, which necessitated consideration that the patient might be at risk for infection with the SARS-CoV-2 virus that causes COVID-19. Institutional protocols and algorithms that pertain to the evaluation of patients at risk for COVID-19 are in a state of rapid change based on information released by regulatory bodies including the CDC and federal and state organizations. These policies and algorithms were followed during the patient's care in the ED.  Some ED evaluations and interventions may be delayed as a result of limited staffing during the pandemic.       Don Perking, [place], MD 01/01/22 [phone]

## 2022-01-01 NOTE — ED Triage Notes (Signed)
Pt presents to ER c/o lower abd pain and vaginal bleeding that started around an hour ago.  Pt states she is passing large vaginal blood clots.  Pt states she is [redacted] weeks pregnant G6P3.  Pt A&O x4 at this time.  In NAD and ambulatory to room.

## 2022-01-02 ENCOUNTER — Telehealth: Payer: Self-pay | Admitting: *Deleted

## 2022-01-02 NOTE — Telephone Encounter (Signed)
Transition Care Management Follow-up Telephone Call Date of discharge and from where: 01/01/2022 [facility] ED How have you been since you were released from the hospital? "I am okay" Any questions or concerns? No  Items Reviewed: Did the pt receive and understand the discharge instructions provided? Yes  Medications obtained and verified?  N/A Other? No  Any new allergies since your discharge? No  Dietary orders reviewed? No Do you have support at home? Yes    Functional Questionnaire: (I = Independent and D = Dependent) ADLs: I  Bathing/Dressing- I  Meal Prep- I  Eating- I  Maintaining continence- I  Transferring/Ambulation- I  Managing Meds- I  Follow up appointments reviewed:  PCP Hospital f/u appt confirmed? No   Specialist Hospital f/u appt confirmed? No   Are transportation arrangements needed? No  If their condition worsens, is the pt aware to call PCP or go to the Emergency Dept.? Yes Was the patient provided with contact information for the PCP's office or ED? Yes Was to pt encouraged to call back with questions or concerns? Yes

## 2022-01-23 ENCOUNTER — Other Ambulatory Visit: Payer: Self-pay

## 2022-01-23 ENCOUNTER — Ambulatory Visit (LOCAL_COMMUNITY_HEALTH_CENTER): Payer: Medicaid Other | Admitting: Advanced Practice Midwife

## 2022-01-23 ENCOUNTER — Ambulatory Visit: Payer: Medicaid Other

## 2022-01-23 ENCOUNTER — Encounter: Payer: Self-pay | Admitting: Advanced Practice Midwife

## 2022-01-23 VITALS — BP 113/79 | Ht 64.0 in | Wt 174.4 lb

## 2022-01-23 DIAGNOSIS — Z3201 Encounter for pregnancy test, result positive: Secondary | ICD-10-CM

## 2022-01-23 DIAGNOSIS — Z419 Encounter for procedure for purposes other than remedying health state, unspecified: Secondary | ICD-10-CM

## 2022-01-23 DIAGNOSIS — Z309 Encounter for contraceptive management, unspecified: Secondary | ICD-10-CM | POA: Diagnosis not present

## 2022-01-23 DIAGNOSIS — A599 Trichomoniasis, unspecified: Secondary | ICD-10-CM | POA: Insufficient documentation

## 2022-01-23 DIAGNOSIS — Z3009 Encounter for other general counseling and advice on contraception: Secondary | ICD-10-CM

## 2022-01-23 DIAGNOSIS — Z30013 Encounter for initial prescription of injectable contraceptive: Secondary | ICD-10-CM

## 2022-01-23 LAB — WET PREP FOR TRICH, YEAST, CLUE
Trichomonas Exam: NEGATIVE
Yeast Exam: NEGATIVE

## 2022-01-23 LAB — PREGNANCY, URINE: Preg Test, Ur: POSITIVE — AB

## 2022-01-23 NOTE — Progress Notes (Signed)
Here for [facility], wants to start Depo. Had a miscarriage 01/01/22, bleeding since that time. Needs PE. Last PE was 05/11/2020 [facility] and [facility], 06/17/2020.Marland KitchenBurt Knack, RN

## 2022-01-23 NOTE — Progress Notes (Signed)
PT positive, wet mount negative. No treatment indicated. Patient scheduled to see provider for follow-up and beta Hcg on 01/31/22.Marland KitchenJenetta Downer, RN

## 2022-01-23 NOTE — Progress Notes (Signed)
[facility] Number: [phone]    Family Planning Visit- Initial Visit  Subjective:  Jody Perez is a 31 y.o. SBFexvaper [id number] (8, 5, 4)   being seen today for an initial annual visit and to discuss reproductive life planning.  The patient is currently using Abstinence for pregnancy prevention. Patient reports   does not want a pregnancy in the next year.  Patient has the following medical conditions has History of genital HSV; History of anemia; Recurrent vaginitis; Obesity BMI=29.9; Vapes nicotine containing substance; Migraine without aura and without status migrainosus, not intractable; and Trichomonas infection12/17/19 on their problem list.  Chief Complaint  Patient presents with   Annual Exam   Contraception    Patient reports here for physical, pap, DMPA reinitiation. Went to ER on 01/01/22 with bleeding and [redacted] wks pregnant. Had u/s 12/14/21 7.0 wks with FHR=131. In ER on 01/01/22 u/s with no GS in uterus except blood clots and "abortion in progress" , BHCg=183,057. Last sex after Christmas 10/2021 without condom; with current partner x 3 years; 1 partner in last 3 mo, and began bleeding 01/01/22. Last PE 05/11/2020. Last pap 11/12/18 neg HPV neg +trich. Last vaped 09/2021. Last cigar 2020. Last MJ age 51. Last ETOH 05/2021 (4 shots liquor). Last dental exam 2022. Not working, not in school. Living with her 3 kids.  Patient denies cigs  Body mass index is 29.94 kg/m. - Patient is eligible for diabetes screening based on BMI and age [id number]?  not applicable [id number] ordered? not applicable  Patient reports 1  partner/s in last year. Desires STI screening?  No - declines bloodwork  Has patient been screened once for HCV in the past?  No  No results found for: HCVAB  Does the patient have current drug use (including MJ), have a partner with drug use, and/or has been incarcerated since last result? No  If yes--  Screen for HCV through [facility]   Does the patient meet criteria for HBV testing? No  Criteria:  -Household, sexual or needle sharing contact with HBV -History of drug use -HIV positive -Those with known Hep C   Health Maintenance Due  Topic Date Due   COVID-19 Vaccine (1) Never done   Hepatitis C Screening  Never done   INFLUENZA VACCINE  Never done   PAP-Cervical Cytology Screening  11/12/2021   PAP SMEAR-Modifier  11/12/2021    Review of Systems  All other systems reviewed and are negative.  The following portions of the patient's history were reviewed and updated as appropriate: allergies, current medications, past family history, past medical history, past social history, past surgical history and problem list. Problem list updated.   See flowsheet for other program required questions.  Objective:   Vitals:   01/23/22 1558  BP: 113/79  Weight: 174 lb 6.4 oz (79.1 kg)  Height: 5\' 4"  (1.626 m)    Physical Exam Constitutional:      Appearance: Normal appearance. She is obese.  HENT:     Head: Normocephalic and atraumatic.     Mouth/Throat:     Mouth: Mucous membranes are moist.  Eyes:     Conjunctiva/sclera: Conjunctivae normal.  Neck:     Thyroid: No thyroid mass, thyromegaly or thyroid tenderness.  Cardiovascular:     Rate and Rhythm: Normal rate and regular rhythm.  Pulmonary:     Effort: Pulmonary effort is normal.     Breath sounds: Normal breath  sounds.  Chest:  Breasts:    Right: Normal.     Left: Normal.  Abdominal:     Palpations: Abdomen is soft.     Comments: Soft without masses or tenderness, fair tone  Genitourinary:    General: Normal vulva.     Exam position: Lithotomy position.     Vagina: Bleeding (moderate red blood from either menses or incomplete AB) present.     Cervix: Normal.     Uterus: Normal.      Adnexa: Right adnexa normal and left adnexa normal.     Rectum: Normal.     Comments: Too much blood in vagina to do  pap--needs next apt Musculoskeletal:        General: Normal range of motion.     Cervical back: Normal range of motion and neck supple.  Skin:    General: Skin is warm and dry.  Neurological:     Mental Status: She is alert.  Psychiatric:        Mood and Affect: Mood normal.      Assessment and Plan:  Jody Perez is a 31 y.o. female presenting to the [facility] Department for an initial annual wellness/contraceptive visit  Contraception counseling: Reviewed all forms of birth control options in the tiered based approach. available including abstinence; over the counter/barrier methods; hormonal contraceptive medication including pill, patch, ring, injection,contraceptive implant, ECP; hormonal and nonhormonal IUDs; permanent sterilization options including vasectomy and the various tubal sterilization modalities. Risks, benefits, and typical effectiveness rates were reviewed.  Questions were answered.  Written information was also given to the patient to review.  Patient desires Abstinence, this was prescribed for patient.    The patient will follow up in  1 week for surveillance.  The patient was told to call with any further questions, or with any concerns about this method of contraception.  Emphasized use of condoms 100% of the time for STI prevention.  Patient was not offered ECP based on not meeting criteria. ECP was not accepted by the patient. ECP counseling was not given - see RN documentation  1. Family planning Please give dental list to pt Treat wet mount per standing orders Immunization nurse consult Needs pap when not bleeding RTC end of week or next week for BHCg results and plan of care. To abstain until BHCg=0. Then wants DMPA Pt to abstain until gets DMPA - [place], [facility], [place] - Pregnancy, urine - [place] (ref lab)  2. Encounter for initial prescription of injectable contraceptive +PT today Went  to ER 01/01/22 with bleeding, BHCg=183,057 and u/s with no gestational sac in uterus, "abortion in progress". U/s on 12/14/21 7.0 wks with FHR=131 Bhcg drawn today  3. Trichomonas infection12/17/19      No follow-ups on file.  No future appointments.  Herbie Saxon, CNM

## 2022-01-24 ENCOUNTER — Encounter: Payer: Self-pay | Admitting: Advanced Practice Midwife

## 2022-01-24 DIAGNOSIS — O034 Incomplete spontaneous abortion without complication: Secondary | ICD-10-CM | POA: Insufficient documentation

## 2022-01-24 LAB — BETA HCG QUANT (REF LAB): hCG Quant: 89 m[IU]/mL

## 2022-01-31 ENCOUNTER — Other Ambulatory Visit: Payer: Self-pay

## 2022-01-31 ENCOUNTER — Ambulatory Visit: Payer: Medicaid Other | Admitting: Family Medicine

## 2022-01-31 VITALS — BP 135/81 | Ht 65.0 in | Wt 174.6 lb

## 2022-01-31 DIAGNOSIS — Z3490 Encounter for supervision of normal pregnancy, unspecified, unspecified trimester: Secondary | ICD-10-CM | POA: Diagnosis not present

## 2022-01-31 NOTE — Progress Notes (Signed)
S: pt in clinic for Beta HCG ? ?O: pt has Beta HCG that was 106,671 on 12/14/2021, last results on 01/23/22 was 89 , PE was 01/23/2022.  Unable to start desired BCM at that time.  ? ?A: 1. Pregnancy, unspecified gestational age ?- Beta hCG quant (ref lab) ? ?P:   Pt to have beta HCG drawn today,  ?To scheduled appointment for next for acute for either Beta HCG or Depo.  IF Beta HCG is 10 or less ok to start Depo.  This was explained to patient. Pt verbalized understanding.  ? ?Junious Dresser, FNP ? ?

## 2022-01-31 NOTE — Progress Notes (Signed)
Patient seen today for an acute visit. Providers orders completed. Next appt made for 02/07/2022 to recheck levels.  ?

## 2022-02-01 LAB — BETA HCG QUANT (REF LAB): hCG Quant: 41 m[IU]/mL

## 2022-02-02 ENCOUNTER — Telehealth: Payer: Self-pay

## 2022-02-02 NOTE — Telephone Encounter (Signed)
Telephone call to patient regarding her Beta Hcg lab this week.  Per Junious Dresser, NP:  Her Beta Hcg levels are declinig, but not enough to start her on a BCM.  Patient has a BC start appointment with ES, CNM on 02-07-2022.  I kept that appointment and explained to patient we may need to cancel her appointment if her levels are still to high to start a BCM.  Repeat Beta Hcg appointment made for 1 pm on 02-06-2022.  A note has been put on the 02-07-2022 appointment to cancel if 02-06-2022 lab results are not back.  Dahlia Bailiff, RN ? ?

## 2022-02-02 NOTE — Progress Notes (Signed)
Reviewed - Levels are declining but not enough to start birth control, please call patient and have her  schedule for follow up Beta HCG in 1 week  ?Thanks

## 2022-02-03 ENCOUNTER — Other Ambulatory Visit: Payer: Self-pay

## 2022-02-03 ENCOUNTER — Ambulatory Visit
Admission: EM | Admit: 2022-02-03 | Discharge: 2022-02-03 | Disposition: A | Discharge status: Home / Self Care | Payer: Medicaid Other | Attending: Emergency Medicine | Admitting: Emergency Medicine

## 2022-02-03 DIAGNOSIS — M62838 Other muscle spasm: Secondary | ICD-10-CM

## 2022-02-03 MED ORDER — IBUPROFEN 600 MG PO TABS: 600.0000 mg | ORAL_TABLET | Freq: Four times a day (QID) | ORAL | 0 refills | Status: DC | PRN

## 2022-02-03 MED ORDER — TIZANIDINE HCL 4 MG PO TABS: 4.0000 mg | ORAL_TABLET | Freq: Three times a day (TID) | ORAL | 0 refills | Status: DC | PRN

## 2022-02-03 NOTE — Discharge Instructions (Addendum)
Take 600 mg of ibuprofen combined with 1000 mg of Tylenol together 3-4 times a day as needed for pain.  Zanaflex will help with muscle spasms.  Deep tissue massage, heat, gentle stretching will also help.  Follow-up with your doctor with Dr. Shaqueena Mauceri Royalty, who specializes in sports medicine if you are not better in a week. ?

## 2022-02-03 NOTE — ED Triage Notes (Signed)
Pt reports neck pain x 1 week. Ibuprofen gives some relief.  ?

## 2022-02-03 NOTE — ED Provider Notes (Signed)
HPI ? ?SUBJECTIVE: ? ?Jody Perez is a 31 y.o. female who presents with 1.5 weeks of left-sided neck pain described as stiff, intermittent, lasting hours.  She states that it is giving her intermittent headaches.  She wonders if she is sleeping wrong.  No arm pain, change in physical activity, trauma to the neck.  No fevers, chest pain, shortness of breath, distal numbness or tingling, grip weakness.  No antipyretic in the past 6 hours.  She has never had symptoms like this before.  She has tried Tylenol 1000 mg and 400 mg of ibuprofen once or twice a day with improvement in her symptoms.  Symptoms are worse with lying down, turning her head and neck flexion/extension.  Past medical history negative for previous neck injury.  She has no other medical problems.  LMP: She had a miscarriage in February, has had vaginal bleeding since.  Denies possibility being pregnant.  PCP: [facility]. ? ? ? ?Past Medical History:  ?Diagnosis Date  ? BV (bacterial vaginosis)   ? Chlamydia   ? Herpes genitalis in women   ? HSV infection   ? Migraines   ? Miscarriage   ? Trichomonas   ? UTI (lower urinary tract infection)   ? ? ?Past Surgical History:  ?Procedure Laterality Date  ? LIPOSUCTION  09/01/2021  ? WISDOM TOOTH EXTRACTION    ? ? ?Family History  ?Problem Relation Age of Onset  ? Hypertension Mother   ? Cancer Mother   ?     Pitutary  ? Healthy Brother   ? Hypertension Brother   ? Healthy Father   ? Breast cancer Maternal Grandmother   ? Healthy Brother   ? Colon cancer Neg Hx   ? Ovarian cancer Neg Hx   ? Cervical cancer Neg Hx   ? ? ?Social History  ? ?Tobacco Use  ? Smoking status: Former  ?  Types: E-cigarettes, Cigars  ?  Passive exposure: Never  ? Smokeless tobacco: Never  ?Vaping Use  ? Vaping Use: Former  ? Substances: Flavoring  ?Substance Use Topics  ? Alcohol use: Not Currently  ?  Alcohol/week: 4.0 standard drinks  ?  Types: 4 Shots of liquor per week  ?  Comment: last use 05/2021  ? Drug use: Not  Currently  ?  Types: Marijuana  ?  Comment: last use age 74  ? ? ?No current facility-administered medications for this encounter. ? ?Current Outpatient Medications:  ?  ibuprofen (ADVIL) 600 MG tablet, Take 1 tablet (600 mg total) by mouth every 6 (six) hours as needed., Disp: 30 tablet, Rfl: 0 ?  tiZANidine (ZANAFLEX) 4 MG tablet, Take 1 tablet (4 mg total) by mouth every 8 (eight) hours as needed for muscle spasms., Disp: 30 tablet, Rfl: 0 ? ?No Known Allergies ? ? ?ROS ? ?As noted in HPI.  ? ?Physical Exam ? ?BP 131/84 (BP Location: Left Arm)   Pulse 88   Temp 99 ?F (37.2 ?C) (Oral)   Resp 16   SpO2 100%   Breastfeeding No  ? ?Constitutional: Well developed, well nourished, no acute distress ?Eyes:  EOMI, conjunctiva normal bilaterally ?HENT: Normocephalic, atraumatic,mucus membranes moist ?Respiratory: Normal inspiratory effort ?Cardiovascular: Normal rate ?GI: nondistended ?skin: No rash, skin intact ?Musculoskeletal: No C-spine tenderness.  Positive tenderness, spasm along the left trapezius, sternocleidomastoid.  No meningismus.  Patient able to move neck through full AROM without any problem.  No pain with movement of the shoulder.  Shoulder nontender.  Distal  sensation intact to light touch and temperature.  Grip strength equal bilaterally.  RP 2+ and equal bilaterally.  No appreciable cervical adenopathy. ?Neurologic: Alert & oriented x 3, no focal neuro deficits ?Psychiatric: Speech and behavior appropriate ? ? ?ED Course ? ? ?Medications - No data to display ? ?No orders of the defined types were placed in this encounter. ? ? ?No results found for this or any previous visit (from the past 24 hour(s)). ?No results found. ? ?ED Clinical Impression ? ?1. Muscle spasms of neck   ?2. Trapezius muscle spasm   ?  ? ?ED Assessment/Plan ? ?Presentation consistent with musculoskeletal neck pain.  She has no trauma, bony tenderness, deferring imaging today.  Home with Zanaflex, Tylenol/ibuprofen, stretching,  deep tissue massage.  Follow-up with her primary care provider or may follow-up with Dr. Zigmund Daniel, sports medicine, as needed.  ? ?Discussed , MDM, treatment plan, and plan for follow-up with patient.  patient agrees with plan.  ? ?Meds ordered this encounter  ?Medications  ? ibuprofen (ADVIL) 600 MG tablet  ?  Sig: Take 1 tablet (600 mg total) by mouth every 6 (six) hours as needed.  ?  Dispense:  30 tablet  ?  Refill:  0  ? tiZANidine (ZANAFLEX) 4 MG tablet  ?  Sig: Take 1 tablet (4 mg total) by mouth every 8 (eight) hours as needed for muscle spasms.  ?  Dispense:  30 tablet  ?  Refill:  0  ? ? ? ? ?*This clinic note was created using [employer]. Therefore, there may be occasional mistakes despite careful proofreading. ? ?? ? ?  ?Melynda Ripple, MD ?02/03/22 [phone] ? ?

## 2022-02-06 ENCOUNTER — Telehealth: Payer: Self-pay

## 2022-02-06 ENCOUNTER — Other Ambulatory Visit: Payer: Medicaid Other

## 2022-02-06 NOTE — Telephone Encounter (Signed)
Client [facility] as scheduled for repeat beta hcg. Call to client and left message she reschedule appt and number to call provided. Jossie Ng, RN ? ?

## 2022-02-07 ENCOUNTER — Ambulatory Visit: Payer: Medicaid Other

## 2022-02-07 NOTE — Telephone Encounter (Signed)
Per Epic appointments:  Patient had an already previously scheduled appointment at 10:30 today. Will call patient if she does not keep this appointment. Hart Carwin, RN ? ?

## 2022-02-13 ENCOUNTER — Ambulatory Visit: Payer: Medicaid Other

## 2022-02-13 ENCOUNTER — Other Ambulatory Visit: Payer: Medicaid Other

## 2022-02-14 ENCOUNTER — Telehealth: Payer: Self-pay | Admitting: Family Medicine

## 2022-02-14 ENCOUNTER — Encounter: Payer: Self-pay | Admitting: Emergency Medicine

## 2022-02-14 ENCOUNTER — Encounter: Payer: Self-pay | Admitting: Family Medicine

## 2022-02-14 ENCOUNTER — Other Ambulatory Visit: Payer: Self-pay

## 2022-02-14 ENCOUNTER — Emergency Department
Admission: EM | Admit: 2022-02-14 | Discharge: 2022-02-14 | Disposition: A | Discharge status: Home / Self Care | Payer: Medicaid Other | Attending: Emergency Medicine | Admitting: Emergency Medicine

## 2022-02-14 ENCOUNTER — Ambulatory Visit: Payer: Medicaid Other | Admitting: Family Medicine

## 2022-02-14 ENCOUNTER — Emergency Department: Payer: Medicaid Other

## 2022-02-14 DIAGNOSIS — M5030 Other cervical disc degeneration, unspecified cervical region: Secondary | ICD-10-CM

## 2022-02-14 DIAGNOSIS — M542 Cervicalgia: Secondary | ICD-10-CM | POA: Diagnosis present

## 2022-02-14 DIAGNOSIS — M50321 Other cervical disc degeneration at C4-C5 level: Secondary | ICD-10-CM | POA: Insufficient documentation

## 2022-02-14 DIAGNOSIS — M436 Torticollis: Secondary | ICD-10-CM | POA: Insufficient documentation

## 2022-02-14 DIAGNOSIS — H9202 Otalgia, left ear: Secondary | ICD-10-CM | POA: Insufficient documentation

## 2022-02-14 DIAGNOSIS — M5031 Other cervical disc degeneration,  high cervical region: Secondary | ICD-10-CM | POA: Diagnosis not present

## 2022-02-14 IMAGING — CR DG CERVICAL SPINE 2 OR 3 VIEWS
1 series · 3 of 3 positions shown · non-contrast
Comparison: None.

CLINICAL DATA: pain x 3 weeks without injury

EXAM:
CERVICAL SPINE - 2-3 VIEW

[Series 1: dg cervical spine 2 or 3 views · 0.14mm/px · 3 of 3 slices shown]
[im 1/3]
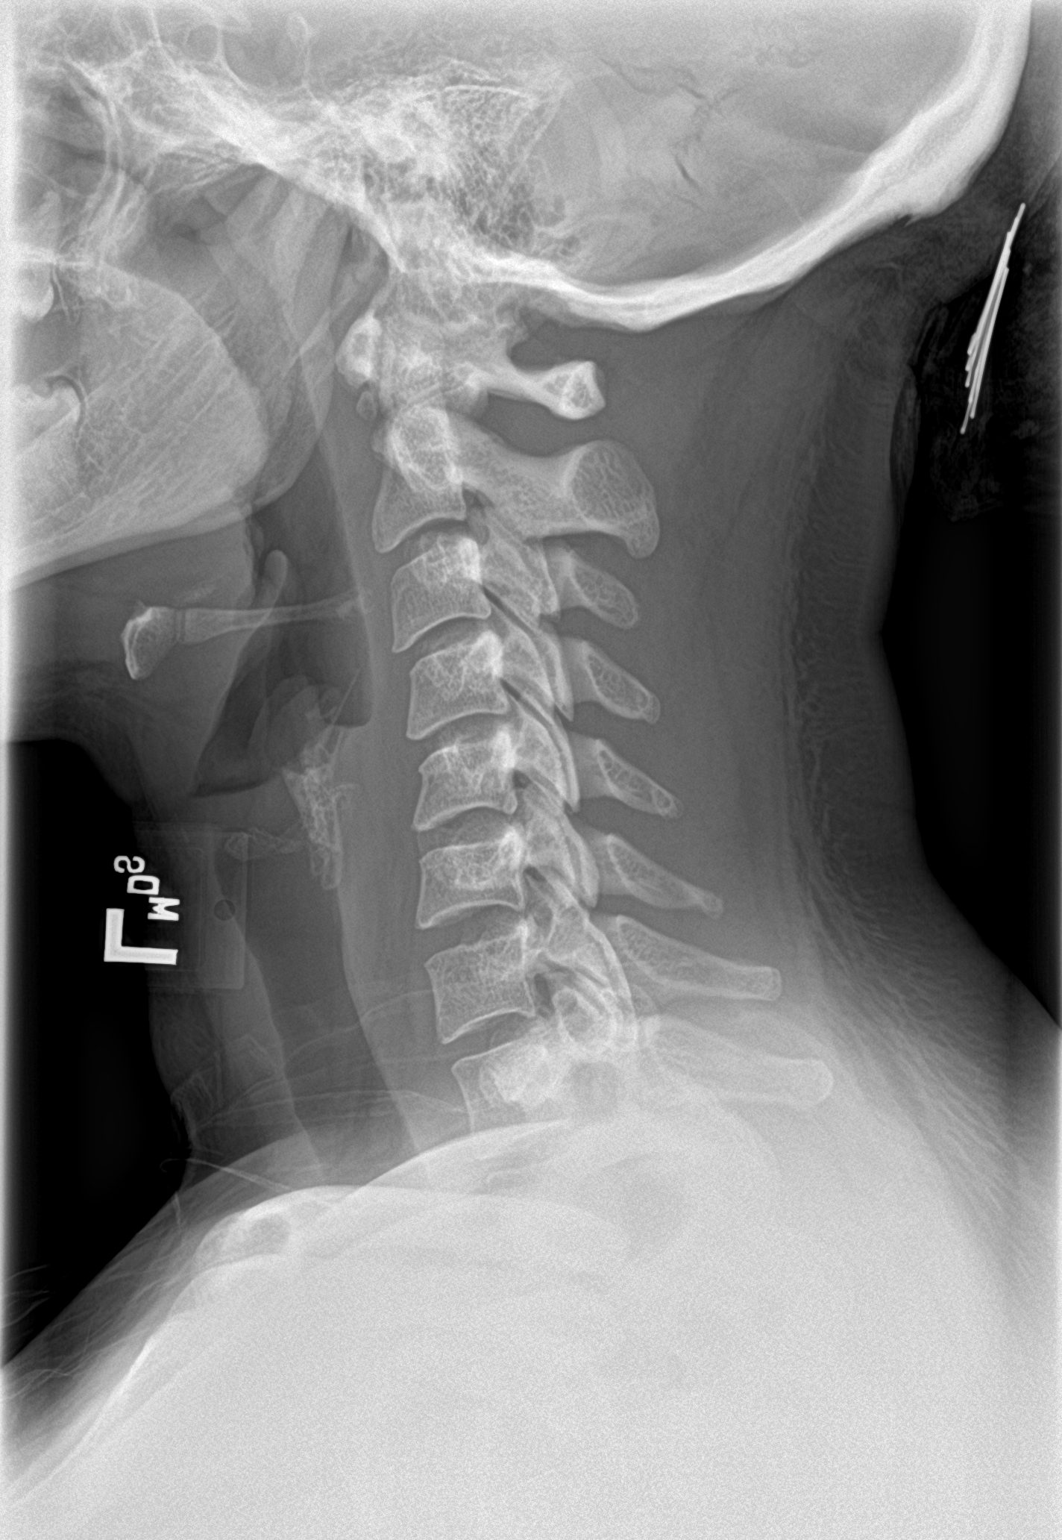
[im 2/3]
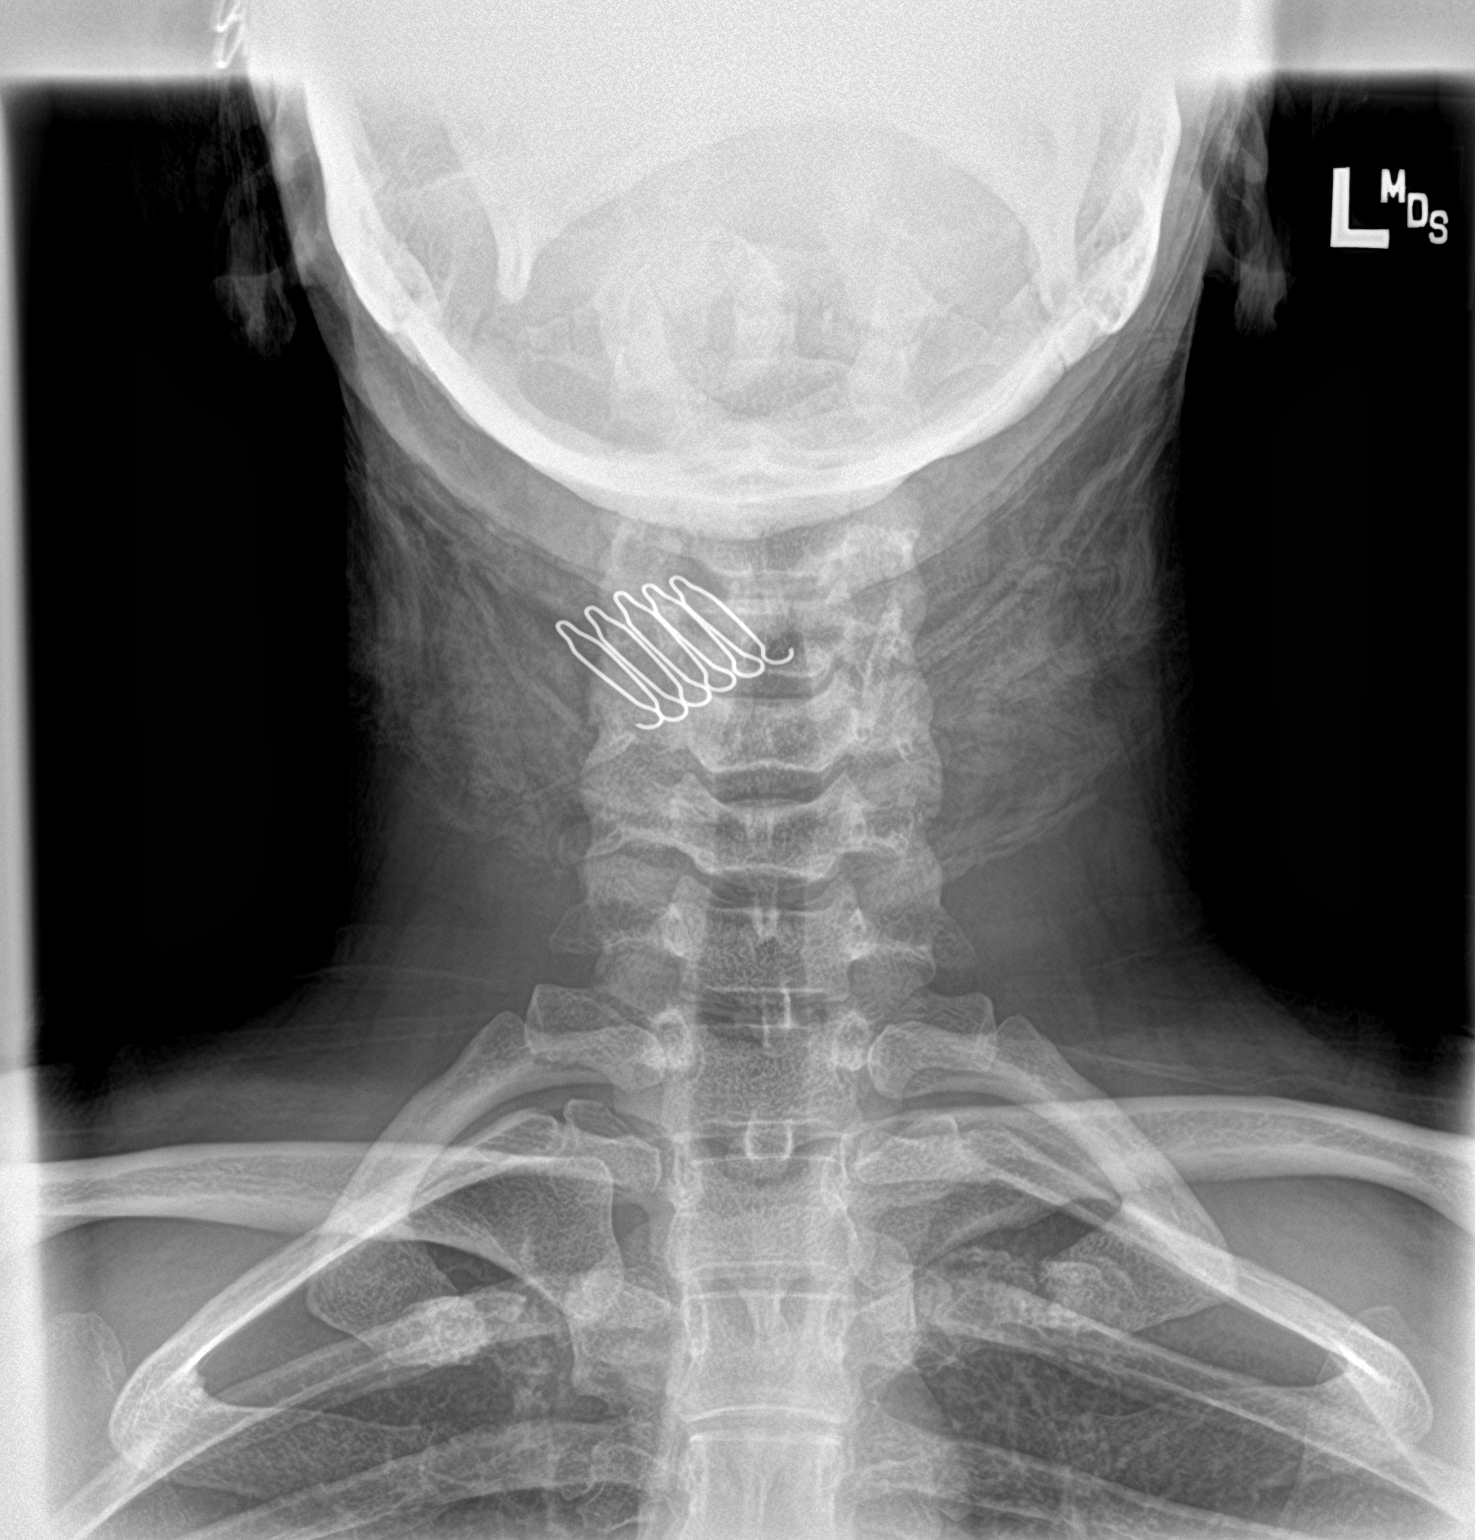
[im 3/3]
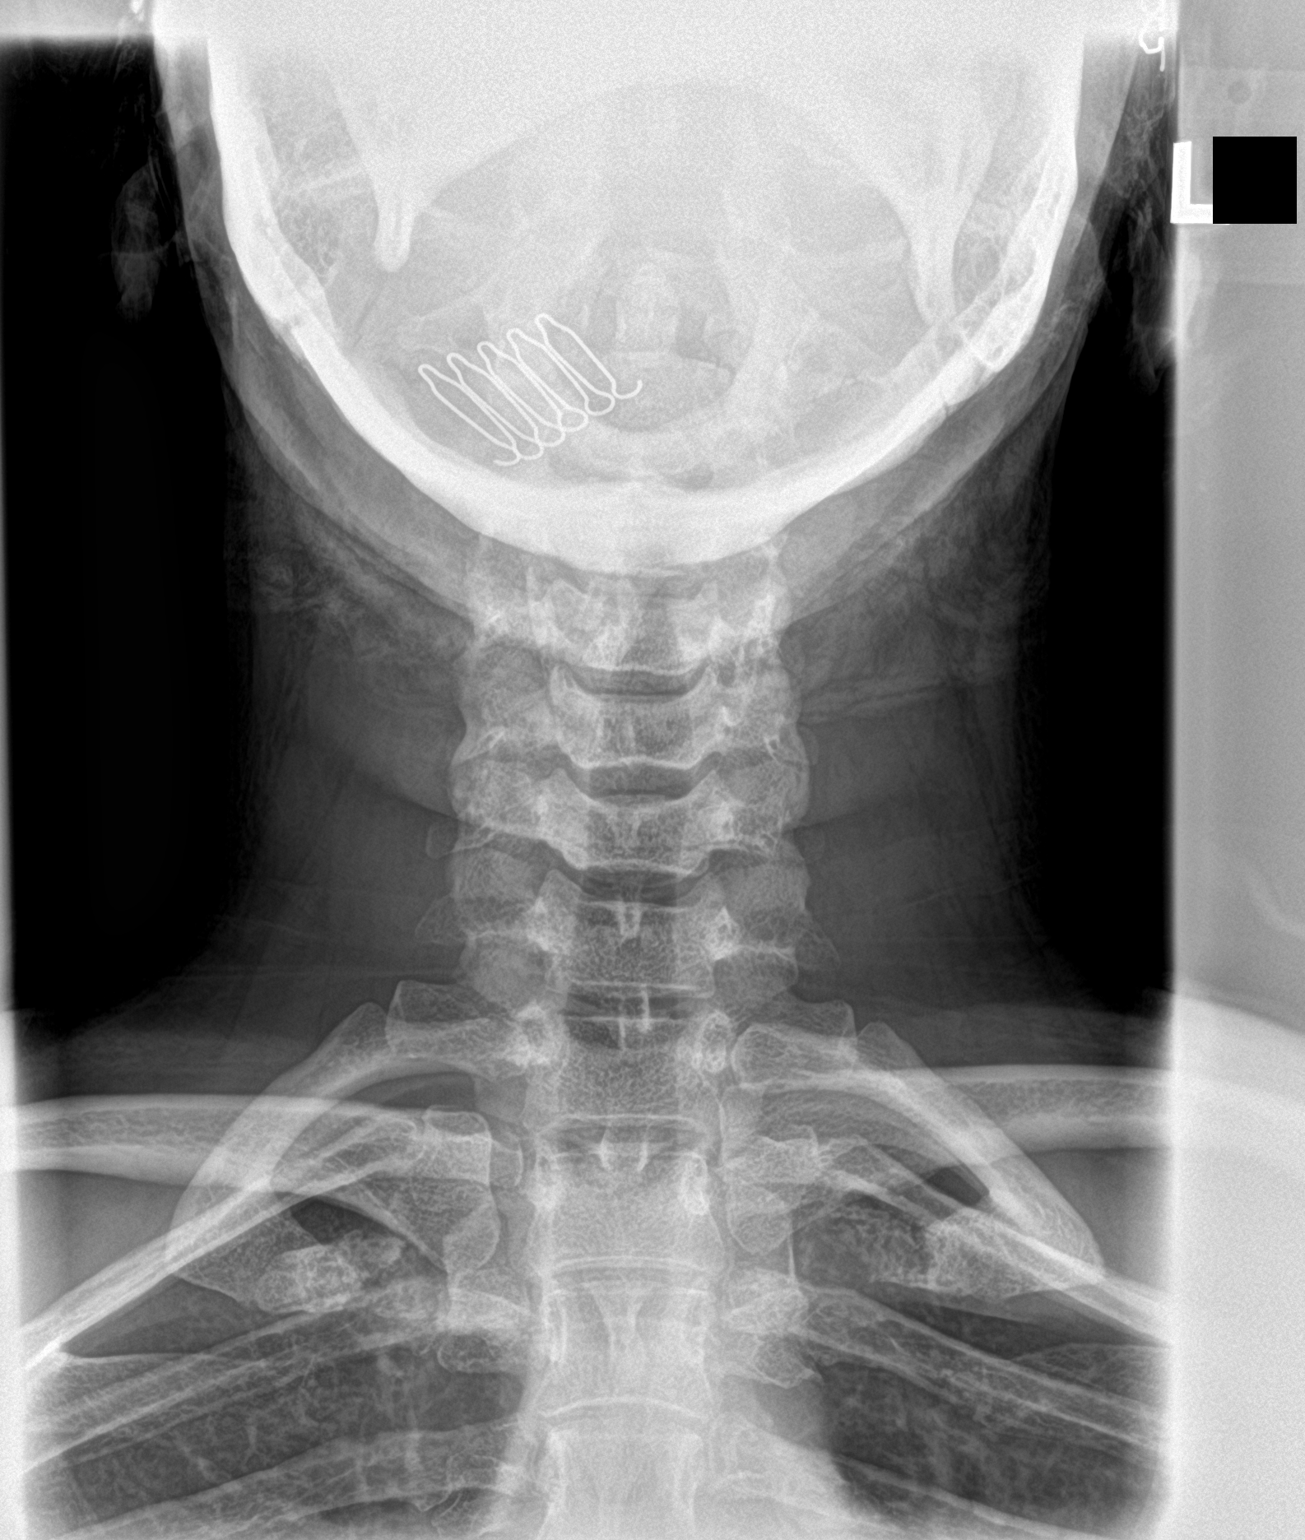

[3 of 3 positions shown; findings below may reference images not displayed]

FINDINGS: There is no radiographically evident cervical spine fracture.
Alignment is normal. There is mild disc height loss at C2-C3, C3-C4,
and C4-C5. Facets appear unremarkable.
IMPRESSION: Mild disc height loss at C2-C3, C3-C4 and C4-C5. Otherwise negative
cervical spine radiographs.

## 2022-02-14 MED ORDER — PREDNISONE 10 MG PO TABS: ORAL_TABLET | ORAL | 0 refills | Status: DC

## 2022-02-14 MED ORDER — METHOCARBAMOL 500 MG PO TABS: 500.0000 mg | ORAL_TABLET | Freq: Four times a day (QID) | ORAL | 0 refills | Status: DC | PRN

## 2022-02-14 MED ORDER — MELOXICAM 15 MG PO TABS: 15.0000 mg | ORAL_TABLET | Freq: Every day | ORAL | 1 refills | Status: DC

## 2022-02-14 MED ORDER — KETOROLAC TROMETHAMINE 30 MG/ML IJ SOLN
30.0000 mg | Freq: Once | INTRAMUSCULAR | Status: AC
  Administered 2022-02-14: 30 mg via INTRAMUSCULAR
  Filled 2022-02-14: qty 1

## 2022-02-14 MED ORDER — OXYCODONE-ACETAMINOPHEN 5-325 MG PO TABS: 1.0000 | ORAL_TABLET | Freq: Four times a day (QID) | ORAL | 0 refills | Status: DC | PRN

## 2022-02-14 MED ORDER — METHOCARBAMOL 500 MG PO TABS
1000.0000 mg | ORAL_TABLET | Freq: Once | ORAL | Status: AC
  Administered 2022-02-14: 1000 mg via ORAL
  Filled 2022-02-14: qty 2

## 2022-02-14 MED ORDER — OXYCODONE-ACETAMINOPHEN 5-325 MG PO TABS
1.0000 | ORAL_TABLET | Freq: Once | ORAL | Status: AC
  Administered 2022-02-14: 1 via ORAL
  Filled 2022-02-14: qty 1

## 2022-02-14 NOTE — ED Provider Notes (Signed)
? ?[facility] ?Provider Note ? ? ? Event Date/Time  ? First MD Initiated Contact with Patient 02/14/22 1104   ?  (approximate) ? ? ?History  ? ?Neck Pain and Otalgia ? ? ?HPI ? ?Jody Perez is a 31 y.o. female   presents to the ED with complaint of left-sided neck pain for approximately 3 weeks.  Patient states that she woke 1 morning with some soreness to the left side of her neck without history of injury.  Patient states that it causes pain to her left ear.  She denies any fever or upper respiratory symptoms.  She states that she was prescribed muscle relaxants which helped for a couple of days but does not remember the name of the muscle relaxant.  Records from urgent care show that patient has been taking Zanaflex 4 mg every 8 hours but apparently is out of those at this time.  Patient has a history of migraines, anemia, and miscarriage last month.  She rates her pain as a 10/10. ? ?  ? ? ?Physical Exam  ? ?Triage Vital Signs: ?ED Triage Vitals [02/14/22 1024]  ?Enc Vitals Group  ?   BP 117/76  ?   Pulse Rate 86  ?   Resp 16  ?   Temp 98.3 ?F (36.8 ?C)  ?   Temp Source Oral  ?   SpO2 100 %  ?   Weight 170 lb (77.1 kg)  ?   Height 5\' 4"  (1.626 m)  ?   Head Circumference   ?   Peak Flow   ?   Pain Score 10  ?   Pain Loc   ?   Pain Edu?   ?   Excl. in GC?   ? ? ?Most recent vital signs: ?Vitals:  ? 02/14/22 1024 02/14/22 1402  ?BP: 117/76 118/70  ?Pulse: 86 80  ?Resp: 16 16  ?Temp: 98.3 ?F (36.8 ?C)   ?SpO2: 100% 100%  ? ? ? ?General: Awake, no distress.  Tearful. ?CV:  Good peripheral perfusion.  Heart regular rate and rhythm. ?Resp:  Normal effort.  Lungs are clear bilaterally. ?Abd:  No distention.  ?Other:  Moderate tenderness on palpation of the left lateral cervical muscles but no tenderness is noted on palpation of the cervical spine posteriorly.  Range of motion is restricted secondary to increased pain.  No skin discoloration or edema noted in this area.  Patient is also tender  into the left trapezius muscle and also upper rhomboid muscle. ? ? ?ED Results / Procedures / Treatments  ? ?Labs ?(all labs ordered are listed, but only abnormal results are displayed) ?Labs Reviewed - No data to display ? ? ? ?RADIOLOGY ?Cervical spine x-ray is negative for bony injury but disc spaces are not consistent.  Radiology report shows degenerative disks changes and narrowing from C2 down to C5. ? ? ? ?PROCEDURES: ? ?Critical Care performed:  ? ?Procedures ? ? ?MEDICATIONS ORDERED IN ED: ?Medications  ?oxyCODONE-acetaminophen (PERCOCET/ROXICET) 5-325 MG per tablet 1 tablet (1 tablet Oral Given 02/14/22 1206)  ?methocarbamol (ROBAXIN) tablet 1,000 mg (1,000 mg Oral Given 02/14/22 1205)  ?ketorolac (TORADOL) 30 MG/ML injection 30 mg (30 mg Intramuscular Given 02/14/22 1206)  ? ? ? ?IMPRESSION / MDM / ASSESSMENT AND PLAN / ED COURSE  ?I reviewed the triage vital signs and the nursing notes. ? ? ?Differential diagnosis includes, but is not limited to, torticollis, degenerative disc disease, cervical strain. ? ?31 year old female presents to the  ED with complaint of cervical pain for approximately 3 weeks.  Patient denies any injury and states that she woke up with pain to the left side of her neck that was present when she woke up.  She has been seen once at which time she got some temporary relief with muscle relaxants.  While in the emergency department patient was given Toradol 30 mg IM, methocarbamol 1000 mg p.o. and Percocet 5/325 1 p.o.  Patient was getting relief of her severe pain and was able to tolerate getting x-rays of her cervical spine.  There was degenerative disc disease noted to the cervical spine and most likely she also has torticollis involving which is increasing her pain.  We discussed this at length that she is to follow-up with her PCP.  Patient was discharged with a prescription for methocarbamol, oxycodone, prednisone taper and to begin taking meloxicam 15 mg daily with food.  She also  is encouraged to use ice or heat to her neck as needed for discomfort. ? ? ? ?  ? ? ?FINAL CLINICAL IMPRESSION(S) / ED DIAGNOSES  ? ?Final diagnoses:  ?Degenerative disc disease, cervical  ?Torticollis  ? ? ? ?Rx / DC Orders  ? ?ED Discharge Orders   ? ?      Ordered  ?  methocarbamol (ROBAXIN) 500 MG tablet  Every 6 hours PRN       ? 02/14/22 1354  ?  oxyCODONE-acetaminophen (PERCOCET) 5-325 MG tablet  Every 6 hours PRN       ? 02/14/22 1354  ?  predniSONE (DELTASONE) 10 MG tablet       ? 02/14/22 1354  ?  meloxicam (MOBIC) 15 MG tablet  Daily       ? 02/14/22 1356  ? ?  ?  ? ?  ? ? ? ?Note:  This document was prepared using Dragon voice recognition software and may include unintentional dictation errors. ?  ?Tommi Rumps, PA-C ?02/14/22 1730 ? ?  ?Dionne Bucy, MD ?02/26/22 2301 ? ?

## 2022-02-14 NOTE — Progress Notes (Deleted)
?  ? ?  I,Moo Gravley S Mylasia Vorhees,acting as a Neurosurgeon for [name], MD.,have documented all relevant documentation on the behalf of [name], MD,as directed by  [name], MD while in the presence of [name], MD. ? ? ?Established patient visit ? ? ?Patient: Jody Perez   DOB: 12/24/1990   31 y.o. Female  MRN: [id number] ?Visit Date: 02/14/2022 ? ?Today's healthcare provider: [name], MD  ? ?No chief complaint on file. ? ?Subjective  ?  ?Otalgia  ?  ? ? ?Medications: ?Outpatient Medications Prior to Visit  ?Medication Sig  ? ibuprofen (ADVIL) 600 MG tablet Take 1 tablet (600 mg total) by mouth every 6 (six) hours as needed.  ? tiZANidine (ZANAFLEX) 4 MG tablet Take 1 tablet (4 mg total) by mouth every 8 (eight) hours as needed for muscle spasms.  ? ?No facility-administered medications prior to visit.  ? ? ?Review of Systems  ?HENT:  Positive for ear pain.   ? ?{Labs  Heme  Chem  Endocrine  Serology  Results Review (optional):23779} ?  Objective  ?  ?There were no vitals taken for this visit. ?{Show previous vital signs (optional):23777} ? ?Physical Exam  ?*** ? ?No results found for any visits on 02/14/22. ? Assessment & Plan  ?  ? ?*** ? ?No follow-ups on file.  ?   ? ?{provider attestation***:1} ? ? ?[name], MD  ?[facility] ?[phone] (phone) ?[phone] (fax) ? ?[facility] ?

## 2022-02-14 NOTE — ED Notes (Signed)
See triage note  presents with left side neck pain and h/a which started about 3 weeks ago  was seen and given muscle relaxers which helped for a short time..then ear pain 2 days ago   and states her vision has been blurred  denies any fever ,trauma ,fever   ?

## 2022-02-14 NOTE — Telephone Encounter (Signed)
LVM for patient to call and rs appt. ?

## 2022-02-14 NOTE — Discharge Instructions (Signed)
Follow-up with your primary care provider.  Call make an appointment.  You may use ice or heat to your neck and muscles as needed for discomfort.  Begin taking medications only as directed.  Do not drive or operate machinery while taking the muscle relaxant and pain medication.  The meloxicam is 1 daily with food for inflammation and should help with any continued neck pain. ?

## 2022-02-14 NOTE — ED Triage Notes (Signed)
Pt states that she has been having left sided neck pain for the past 3 weeks, states that she woke up one morning with the pain, denies any injury, states that the pain is on the left side of her head, left neck and also feels like the pain is in her left ear and she can hear her heart beat in her ear, states that she was prescribed some muscle relaxers that only helped a coupled days, pt is tearful in triage ?

## 2022-02-15 ENCOUNTER — Telehealth: Payer: Self-pay

## 2022-02-15 NOTE — Telephone Encounter (Signed)
Transition Care Management Unsuccessful Follow-up Telephone Call ? ?Date of discharge and from where:  02/14/2022 from [facility] ? ?Attempts:  1st Attempt ? ?Reason for unsuccessful TCM follow-up call:  Left voice message ? ? ? ?

## 2022-02-16 NOTE — Telephone Encounter (Signed)
Transition Care Management Follow-up Telephone Call ?Date of discharge and from where: 02/14/2022 from [facility] ?How have you been since you were released from the hospital? Patient stated that she is feeling better and did not have any questions or concerns at this time.  ?Any questions or concerns? No ? ?Items Reviewed: ?Did the pt receive and understand the discharge instructions provided? Yes  ?Medications obtained and verified? Yes  ?Other? No  ?Any new allergies since your discharge? No  ?Dietary orders reviewed? No ?Do you have support at home? Yes  ? ?Functional Questionnaire: (I = Independent and D = Dependent) ?ADLs: I ? ?Bathing/Dressing- I ? ?Meal Prep- I ? ?Eating- I ? ?Maintaining continence- I ? ?Transferring/Ambulation- I ? ?Managing Meds- I ? ?Follow up appointments reviewed: ? ?PCP Hospital f/u appt confirmed? No  Patient is calling PCP for a follow up appt.  ?Specialist Hospital f/u appt confirmed? No   ?Are transportation arrangements needed? No  ?If their condition worsens, is the pt aware to call PCP or go to the Emergency Dept.? Yes ?Was the patient provided with contact information for the PCP's office or ED? Yes ?Was to pt encouraged to call back with questions or concerns? Yes ? ?

## 2022-02-21 ENCOUNTER — Emergency Department: Payer: Medicaid Other

## 2022-02-21 ENCOUNTER — Ambulatory Visit: Payer: Self-pay

## 2022-02-21 ENCOUNTER — Emergency Department
Admission: EM | Admit: 2022-02-21 | Discharge: 2022-02-21 | Disposition: A | Discharge status: Home / Self Care | Payer: Medicaid Other | Attending: Emergency Medicine | Admitting: Emergency Medicine

## 2022-02-21 ENCOUNTER — Other Ambulatory Visit: Payer: Self-pay

## 2022-02-21 DIAGNOSIS — M542 Cervicalgia: Secondary | ICD-10-CM | POA: Insufficient documentation

## 2022-02-21 DIAGNOSIS — R519 Headache, unspecified: Secondary | ICD-10-CM | POA: Diagnosis present

## 2022-02-21 DIAGNOSIS — G44209 Tension-type headache, unspecified, not intractable: Secondary | ICD-10-CM | POA: Insufficient documentation

## 2022-02-21 MED ORDER — CYCLOBENZAPRINE HCL 10 MG PO TABS: 10.0000 mg | ORAL_TABLET | Freq: Three times a day (TID) | ORAL | 0 refills | Status: DC | PRN

## 2022-02-21 MED ORDER — ONDANSETRON 4 MG PO TBDP: 4.0000 mg | ORAL_TABLET | Freq: Three times a day (TID) | ORAL | 0 refills | Status: DC | PRN

## 2022-02-21 MED ORDER — KETOROLAC TROMETHAMINE 30 MG/ML IJ SOLN
30.0000 mg | Freq: Once | INTRAMUSCULAR | Status: AC
  Administered 2022-02-21: 30 mg via INTRAVENOUS
  Filled 2022-02-21: qty 1

## 2022-02-21 MED ORDER — SODIUM CHLORIDE 0.9 % IV BOLUS
1000.0000 mL | Freq: Once | INTRAVENOUS | Status: AC
  Administered 2022-02-21: 1000 mL via INTRAVENOUS

## 2022-02-21 MED ORDER — METOCLOPRAMIDE HCL 5 MG/ML IJ SOLN
10.0000 mg | Freq: Once | INTRAMUSCULAR | Status: AC
  Administered 2022-02-21: 10 mg via INTRAVENOUS
  Filled 2022-02-21: qty 2

## 2022-02-21 NOTE — Discharge Instructions (Signed)
Follow-up with [facility] clinic neurology.  Please call for an appointment.  Return emergency department worsening.  Instead of the methocarbamol when she did take Flexeril for the muscle spasms in your neck.  This may help with the tension headaches.  Zofran ODT for nausea as needed.  Apply ice to the back of the neck.  Consider massage therapy. ?

## 2022-02-21 NOTE — ED Provider Notes (Signed)
? ?[facility] ?Provider Note ? ? ? Event Date/Time  ? First MD Initiated Contact with Patient 02/21/22 [phone]   ?  (approximate) ? ? ?History  ? ?Neck Pain ? ? ?HPI ? ?Jody Perez is a 31 y.o. female with history of migraines presents emergency department with worsening headaches.  Patient was seen here and given prednisone, muscle relaxer, and pain medication.  States pain medications make her nauseated.  States vision is becoming worse in which she can just see shapes.  No head injury.  States the pain radiates from the back of the head to the front.  Feels a lot of pressure behind her eyes.  Symptoms of been ongoing for 10 days. ? ?  ? ? ?Physical Exam  ? ?Triage Vital Signs: ?ED Triage Vitals  ?Enc Vitals Group  ?   BP 02/21/22 0843 129/85  ?   Pulse Rate 02/21/22 0843 73  ?   Resp 02/21/22 0843 16  ?   Temp 02/21/22 0843 98.1 ?F (36.7 ?C)  ?   Temp Source 02/21/22 0843 Oral  ?   SpO2 02/21/22 0843 100 %  ?   Weight 02/21/22 0847 169 lb 15.6 oz (77.1 kg)  ?   Height 02/21/22 0847 5\' 4"  (1.626 m)  ?   Head Circumference --   ?   Peak Flow --   ?   Pain Score --   ?   Pain Loc --   ?   Pain Edu? --   ?   Excl. in GC? --   ? ? ?Most recent vital signs: ?Vitals:  ? 02/21/22 0843  ?BP: 129/85  ?Pulse: 73  ?Resp: 16  ?Temp: 98.1 ?F (36.7 ?C)  ?SpO2: 100%  ? ? ? ?General: Awake, no distress.   ?CV:  Good peripheral perfusion. regular rate and  rhythm ?Resp:  Normal effort. Lungs CTA ?Abd:  No distention.   ?Other:  Cranial nerves II through XII grossly intact ? ? ?ED Results / Procedures / Treatments  ? ?Labs ?(all labs ordered are listed, but only abnormal results are displayed) ?Labs Reviewed - No data to display ? ? ?EKG ? ? ? ? ?RADIOLOGY ?CT of the head ? ? ? ?PROCEDURES: ? ? ?Procedures ? ? ?MEDICATIONS ORDERED IN ED: ?Medications  ?sodium chloride 0.9 % bolus 1,000 mL (1,000 mLs Intravenous New Bag/Given 02/21/22 0916)  ?metoCLOPramide (REGLAN) injection 10 mg (10 mg Intravenous Given  02/21/22 0915)  ?ketorolac (TORADOL) 30 MG/ML injection 30 mg (30 mg Intravenous Given 02/21/22 1010)  ? ? ? ?IMPRESSION / MDM / ASSESSMENT AND PLAN / ED COURSE  ?I reviewed the triage vital signs and the nursing notes. ?             ?               ? ?Differential diagnosis includes, but is not limited to, SAH, CVA, subdural, optic neuritis, tumor, tension headache ? ?CT of the head ordered due to the new symptoms with her headaches. ? ?Migraine cocktail of normal saline, Reglan, and will do Toradol after CT if negative. ? ?CT of the head was independently reviewed by me I do not see an abnormality.  Confirmed by radiology ? ?The patient was having relief with just the Reglan and fluids, added Toradol after the CT was negative. ? 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  I want her to follow-up with neurology for  further evaluation.  She was questioning me about a pituitary tumor as he is run in her family.  Explained to her that the testing for that can be done as outpatient with neurology as they would need elevated levels to do a MRI of the pituitary gland.  Patient is in agreement treatment plan.  She was given prescription for Flexeril and is to stop the methocarbamol, Zofran ODT for nausea associated with pain medication.  Discharged in stable condition. ? ? ?  ? ? ?FINAL CLINICAL IMPRESSION(S) / ED DIAGNOSES  ? ?Final diagnoses:  ?Tension headache  ? ? ? ?Rx / DC Orders  ? ?ED Discharge Orders   ? ?      Ordered  ?  cyclobenzaprine (FLEXERIL) 10 MG tablet  3 times daily PRN       ? 02/21/22 1013  ?  ondansetron (ZOFRAN-ODT) 4 MG disintegrating tablet  Every 8 hours PRN       ? 02/21/22 1013  ? ?  ?  ? ?  ? ? ? ?Note:  This document was prepared using Dragon voice recognition software and may include unintentional dictation errors. ? ?  ?Faythe Ghee, PA-C ?02/21/22 1016 ? ?  ?Jene Every, MD ?02/21/22 1024 ? ?

## 2022-02-21 NOTE — Telephone Encounter (Signed)
Noted  

## 2022-02-21 NOTE — ED Notes (Signed)
See triage note  presents with pain to neck for the past 10 days  states she was seen for same at that time  was placed on steroids and percocet  states pain is also making her head hurt and she is having some blurred vision   denies any trauma or fever ?

## 2022-02-21 NOTE — Telephone Encounter (Signed)
?  Chief Complaint: Neck pain, HA, vision is off and left hand numbness ?Symptoms: IBID ?Frequency: 1.5 weeks ?Pertinent Negatives: Patient denies  ?Disposition: [x] ED /[] Urgent Care (no appt availability in office) / [] Appointment(In office/virtual)/ []  [facility]/ [] Home Care/ [] Refused Recommended Disposition /[] [facility]/ []  Follow-up with PCP ?Additional Notes: Pt seen for similar S/S at ED earlier this week. ? ? ? ?Reason for Disposition ? Weakness of an arm or hand ? ?Answer Assessment - Initial Assessment Questions ?1. ONSET: "When did the pain begin?"  ?    1 and 1/2 ago ?2. LOCATION: "Where does it hurt?"  ?    Neck and HA ?3. PATTERN "Does the pain come and go, or has it been constant since it started?"  ?    Comes and goes ?4. SEVERITY: "How bad is the pain?"  (Scale 1-10; or mild, moderate, severe) ?  - NO PAIN (0): no pain or only slight stiffness  ?  - MILD (1-3): doesn't interfere with normal activities  ?  - MODERATE (4-7): interferes with normal activities or awakens from sleep  ?  - SEVERE (8-10):  excruciating pain, unable to do any normal activities  ?    10/10 ?5. RADIATION: "Does the pain go anywhere else, shoot into your arms?" ?    no ?6. CORD SYMPTOMS: "Any weakness or numbness of the arms or legs?" ?    Yes - left hand ?7. CAUSE: "What do you think is causing the neck pain?" ?    Pinched nerve ?8. NECK OVERUSE: "Any recent activities that involved turning or twisting the neck?" ?    no ?9. OTHER SYMPTOMS: "Do you have any other symptoms?" (e.g., headache, fever, chest pain, difficulty breathing, neck swelling) ?    HA ?10. PREGNANCY: "Is there any chance you are pregnant?" "When was your last menstrual period?" ?      No ? ?Protocols used: Neck Pain or Stiffness-A-AH ? ?

## 2022-02-21 NOTE — ED Triage Notes (Signed)
Pt c/o neck and head pain for the past 10 days, having dizziness and blurred vision with the pt with a "whooshing sound" in BL ears. States she was seen recently for the same sx and doesn't seem to be getting any better. ?

## 2022-02-22 ENCOUNTER — Telehealth: Payer: Self-pay

## 2022-02-22 NOTE — Telephone Encounter (Signed)
Transition Care Management Unsuccessful Follow-up Telephone Call ? ?Date of discharge and from where:  02/21/2022 from [facility] ? ?Attempts:  1st Attempt ? ?Reason for unsuccessful TCM follow-up call:  Left voice message ? ? ? ?

## 2022-02-23 NOTE — Telephone Encounter (Signed)
Transition Care Management Unsuccessful Follow-up Telephone Call ? ?Date of discharge and from where:  02/21/2022 from [facility] ? ?Attempts:  2nd Attempt ? ?Reason for unsuccessful TCM follow-up call:  Left voice message ? ? ? ?

## 2022-02-24 ENCOUNTER — Emergency Department: Payer: Medicaid Other

## 2022-02-24 ENCOUNTER — Inpatient Hospital Stay
Admission: EM | Admit: 2022-02-24 | Discharge: 2022-02-26 | DRG: 812 | Disposition: A | Discharge status: Home / Self Care | Payer: Medicaid Other | Attending: Internal Medicine | Admitting: Internal Medicine

## 2022-02-24 ENCOUNTER — Encounter: Payer: Self-pay | Admitting: Emergency Medicine

## 2022-02-24 ENCOUNTER — Other Ambulatory Visit: Payer: Self-pay

## 2022-02-24 DIAGNOSIS — M4802 Spinal stenosis, cervical region: Secondary | ICD-10-CM | POA: Diagnosis present

## 2022-02-24 DIAGNOSIS — Z8249 Family history of ischemic heart disease and other diseases of the circulatory system: Secondary | ICD-10-CM

## 2022-02-24 DIAGNOSIS — M542 Cervicalgia: Principal | ICD-10-CM

## 2022-02-24 DIAGNOSIS — T380X5A Adverse effect of glucocorticoids and synthetic analogues, initial encounter: Secondary | ICD-10-CM | POA: Diagnosis present

## 2022-02-24 DIAGNOSIS — M50221 Other cervical disc displacement at C4-C5 level: Secondary | ICD-10-CM | POA: Diagnosis present

## 2022-02-24 DIAGNOSIS — N83202 Unspecified ovarian cyst, left side: Secondary | ICD-10-CM | POA: Diagnosis present

## 2022-02-24 DIAGNOSIS — N83209 Unspecified ovarian cyst, unspecified side: Secondary | ICD-10-CM

## 2022-02-24 DIAGNOSIS — Z803 Family history of malignant neoplasm of breast: Secondary | ICD-10-CM

## 2022-02-24 DIAGNOSIS — Z79899 Other long term (current) drug therapy: Secondary | ICD-10-CM

## 2022-02-24 DIAGNOSIS — Z87891 Personal history of nicotine dependence: Secondary | ICD-10-CM

## 2022-02-24 DIAGNOSIS — D72828 Other elevated white blood cell count: Secondary | ICD-10-CM | POA: Diagnosis present

## 2022-02-24 DIAGNOSIS — D72829 Elevated white blood cell count, unspecified: Secondary | ICD-10-CM

## 2022-02-24 DIAGNOSIS — J029 Acute pharyngitis, unspecified: Secondary | ICD-10-CM

## 2022-02-24 DIAGNOSIS — D649 Anemia, unspecified: Secondary | ICD-10-CM | POA: Diagnosis present

## 2022-02-24 DIAGNOSIS — Z20822 Contact with and (suspected) exposure to covid-19: Secondary | ICD-10-CM | POA: Diagnosis present

## 2022-02-24 DIAGNOSIS — M5020 Other cervical disc displacement, unspecified cervical region: Secondary | ICD-10-CM

## 2022-02-24 DIAGNOSIS — D509 Iron deficiency anemia, unspecified: Principal | ICD-10-CM | POA: Diagnosis present

## 2022-02-24 LAB — COMPREHENSIVE METABOLIC PANEL
ALT: 9 U/L (ref 0–44)
AST: 18 U/L (ref 15–41)
Albumin: 3.6 g/dL (ref 3.5–5.0)
Alkaline Phosphatase: 54 U/L (ref 38–126)
Anion gap: 9 (ref 5–15)
BUN: 13 mg/dL (ref 6–20)
CO2: 22 mmol/L (ref 22–32)
Calcium: 8.4 mg/dL — ABNORMAL LOW (ref 8.9–10.3)
Chloride: 102 mmol/L (ref 98–111)
Creatinine, Ser: 0.75 mg/dL (ref 0.44–1.00)
GFR, Estimated: >60 mL/min (ref >60)
Glucose, Bld: 120 mg/dL — ABNORMAL HIGH (ref 70–99)
Potassium: 3.9 mmol/L (ref 3.5–5.1)
Sodium: 133 mmol/L — ABNORMAL LOW (ref 135–145)
Total Bilirubin: 0.4 mg/dL (ref 0.3–1.2)
Total Protein: 8.2 g/dL — ABNORMAL HIGH (ref 6.5–8.1)

## 2022-02-24 LAB — TROPONIN I (HIGH SENSITIVITY)
Troponin I (High Sensitivity): 3 ng/L (ref <18)
Troponin I (High Sensitivity): 4 ng/L (ref <18)

## 2022-02-24 LAB — CBC
HCT: 24.2 % — ABNORMAL LOW (ref 36.0–46.0)
HCT: 23.9 % — ABNORMAL LOW (ref 36.0–46.0)
Hemoglobin: 6.7 g/dL — ABNORMAL LOW (ref 12.0–15.0)
Hemoglobin: 6.7 g/dL — ABNORMAL LOW (ref 12.0–15.0)
MCH: 21.8 pg — ABNORMAL LOW (ref 26.0–34.0)
MCH: 21.8 pg — ABNORMAL LOW (ref 26.0–34.0)
MCHC: 27.7 g/dL — ABNORMAL LOW (ref 30.0–36.0)
MCHC: 28 g/dL — ABNORMAL LOW (ref 30.0–36.0)
MCV: 78.6 fL — ABNORMAL LOW (ref 80.0–100.0)
MCV: 77.6 fL — ABNORMAL LOW (ref 80.0–100.0)
Platelets: 525 10*3/uL — ABNORMAL HIGH (ref 150–400)
Platelets: 595 10*3/uL — ABNORMAL HIGH (ref 150–400)
RBC: 3.08 MIL/uL — ABNORMAL LOW (ref 3.87–5.11)
RBC: 3.08 MIL/uL — ABNORMAL LOW (ref 3.87–5.11)
RDW: 17.9 % — ABNORMAL HIGH (ref 11.5–15.5)
RDW: 17.7 % — ABNORMAL HIGH (ref 11.5–15.5)
WBC: 19.1 10*3/uL — ABNORMAL HIGH (ref 4.0–10.5)
WBC: 19.9 10*3/uL — ABNORMAL HIGH (ref 4.0–10.5)
nRBC: 0.1 % (ref 0.0–0.2)
nRBC: 0 % (ref 0.0–0.2)

## 2022-02-24 LAB — LIPASE, BLOOD: Lipase: 31 U/L (ref 11–51)

## 2022-02-24 LAB — POC URINE PREG, ED: Preg Test, Ur: NEGATIVE

## 2022-02-24 LAB — CBG MONITORING, ED: Glucose-Capillary: 94 mg/dL (ref 70–99)

## 2022-02-24 IMAGING — US US PELVIS COMPLETE
1 series · 14 of 25 positions shown · non-contrast
Comparison: None.

CLINICAL DATA: Pelvic pain

EXAM:
TRANSABDOMINAL ULTRASOUND OF PELVIS
TECHNIQUE: Transabdominal ultrasound examination of the pelvis was performed
including evaluation of the uterus, ovaries, adnexal regions, and
pelvic cul-de-sac.

[Series 1: us pelvic complete with transvaginal · 14 of 51 slices shown]
[im 1/51]
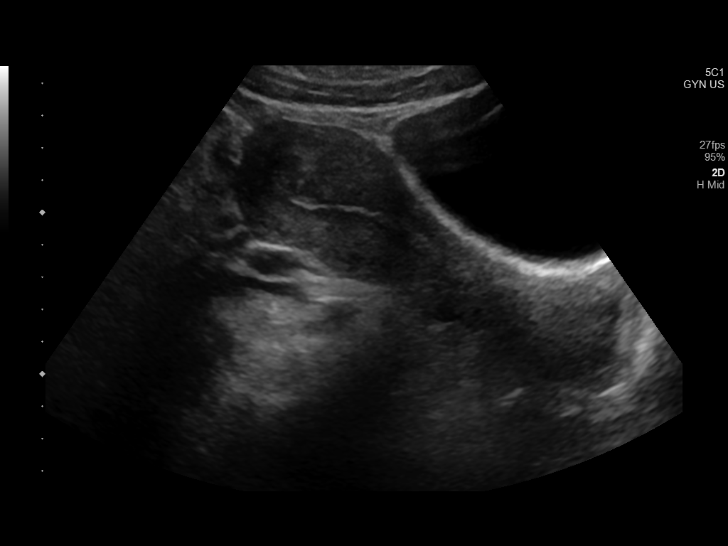
[im 5/51]
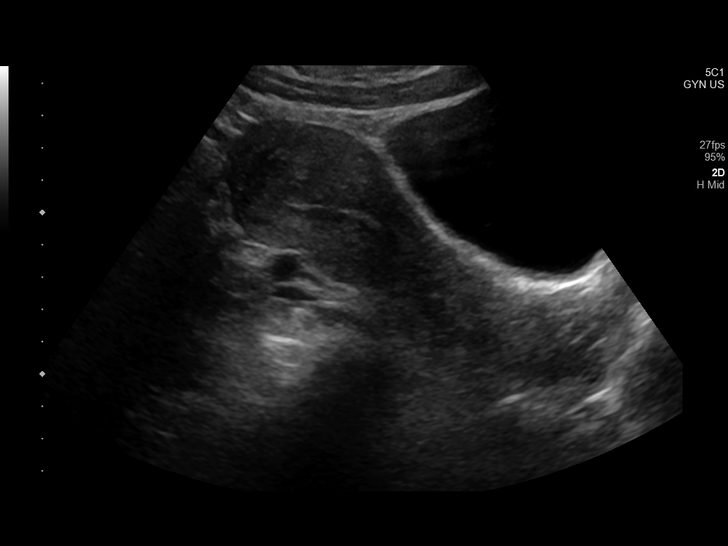
[im 9/51]
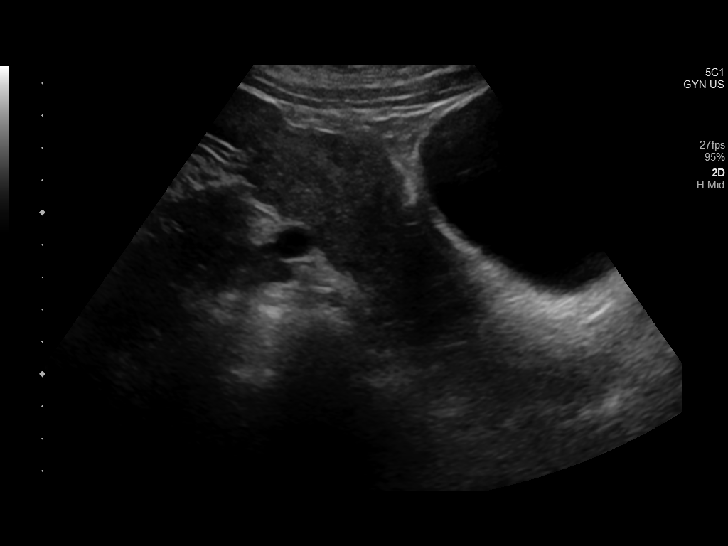
[im 13/51]
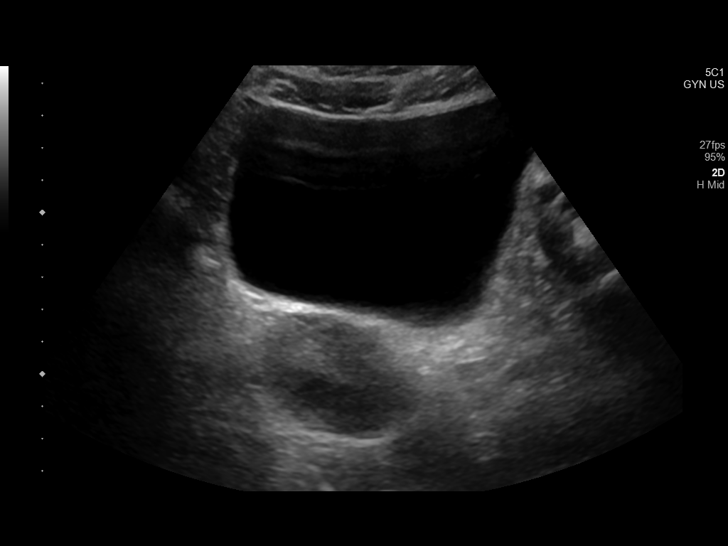
[im 17/51]
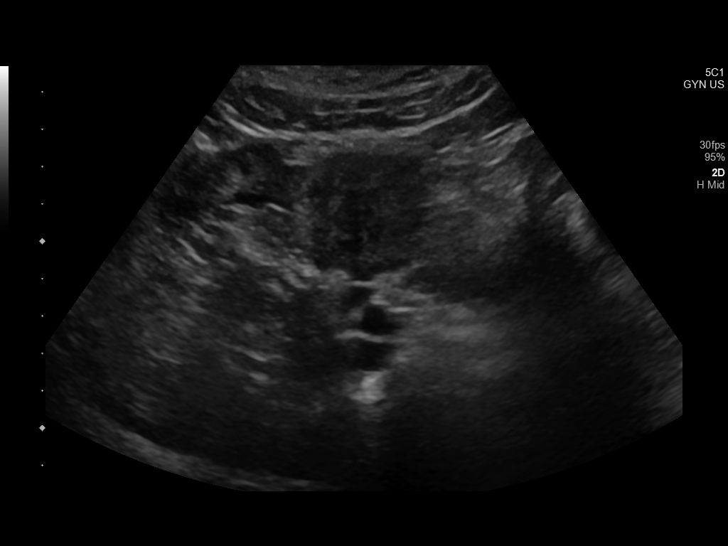
[im 19/51]
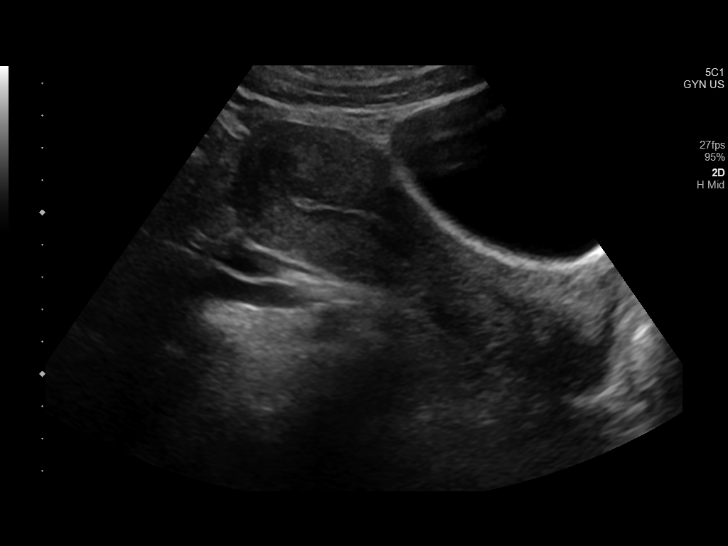
[im 23/51]
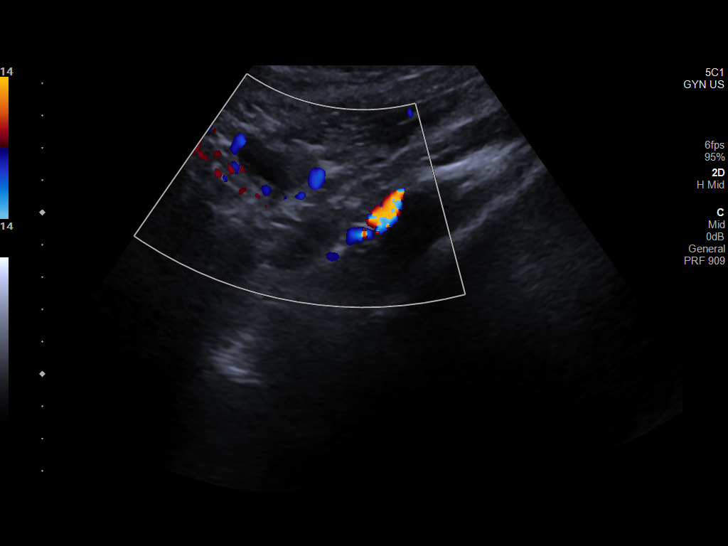
[im 28/51]
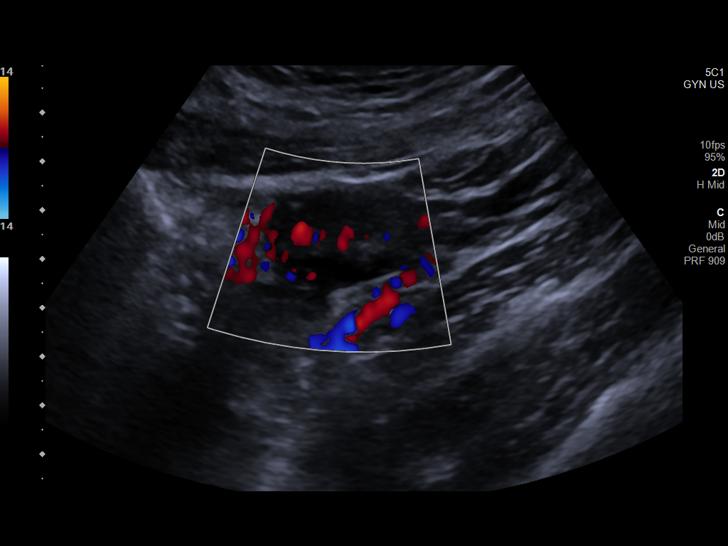
[im 32/51]
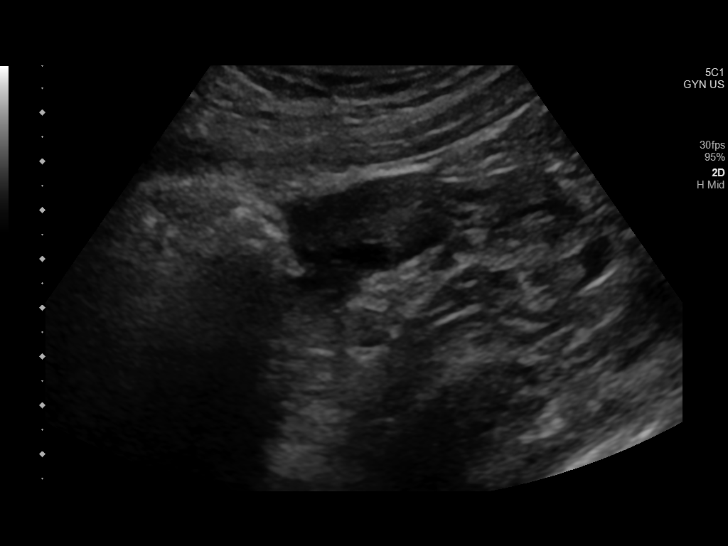
[im 34/51]
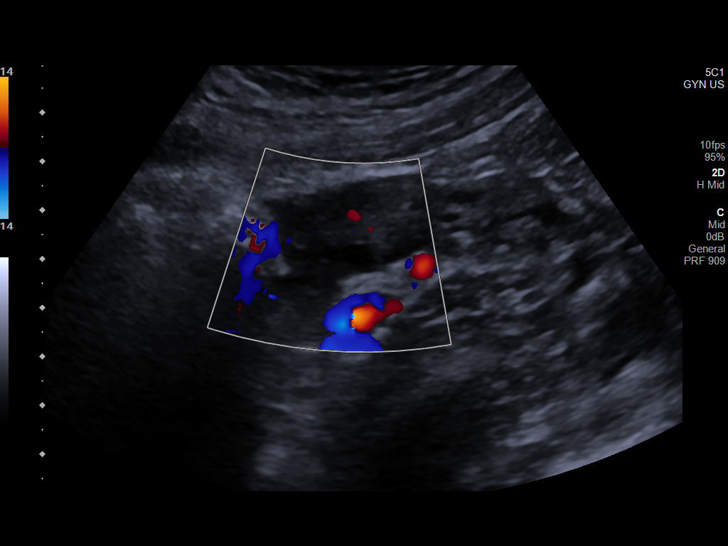
[im 38/51]
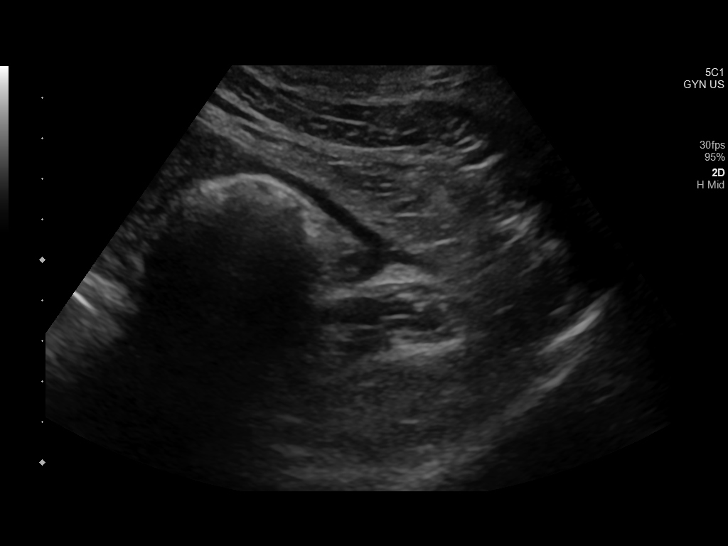
[im 42/51]
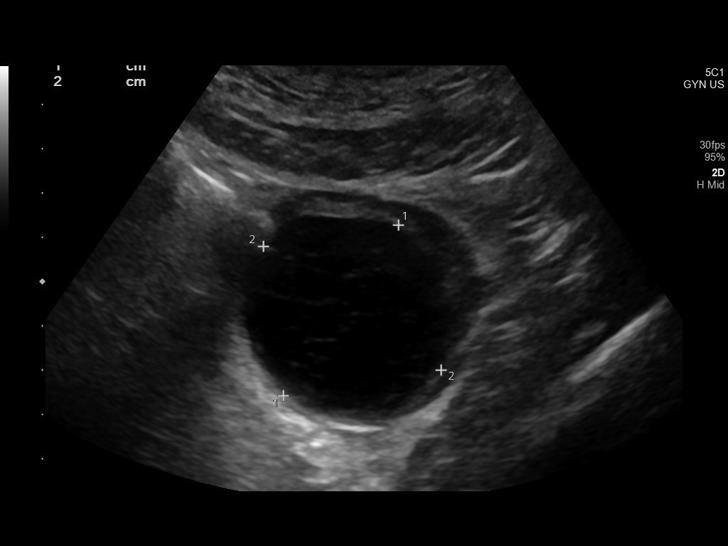
[im 46/51]
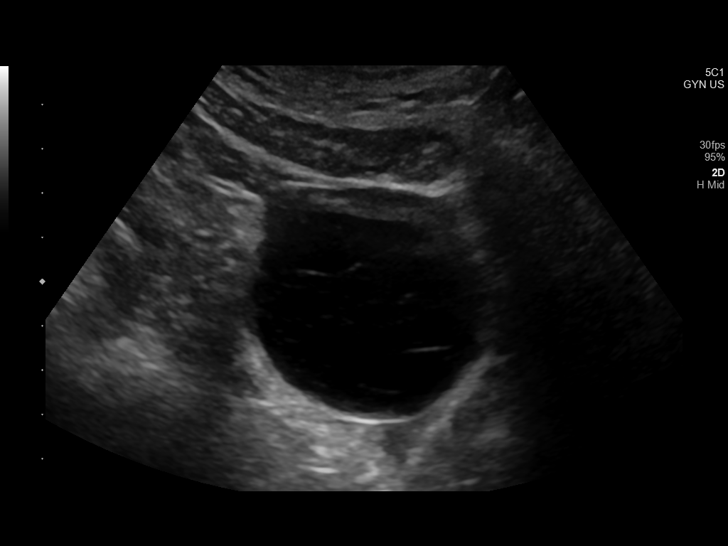
[im 51/51]
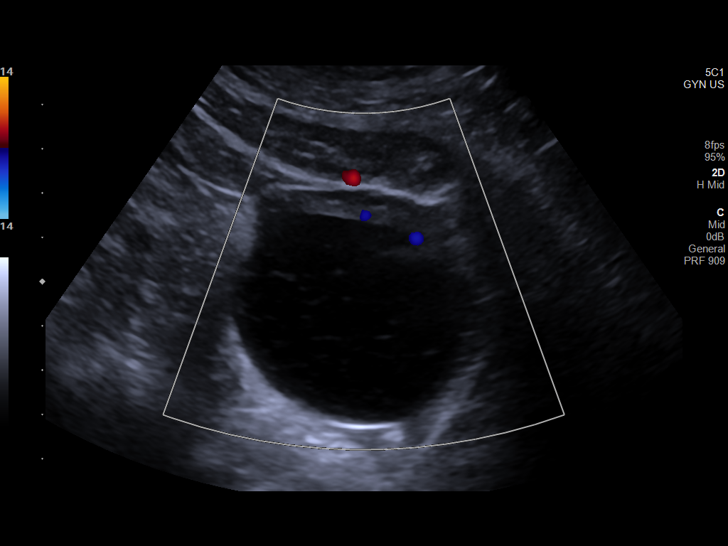

[14 of 25 positions shown; findings below may reference images not displayed]

FINDINGS: Uterus

Measurements: 13.3 x 4.0 x 4.8 cm = volume: 136 mL. No fibroids or
other mass visualized.

Endometrium

Thickness: Normal thickness, 5 mm.  No focal abnormality visualized.

Right ovary

Measurements: 3.4 x 2.0 x 3.3 cm = volume: 11.5 mL. Normal
appearance/no adnexal mass.

Left ovary

Measurements: 5.4 x 5.7 x 6.2 cm = volume: 104 mL. 4.7 x 4.9 x
cm cystic lesion with internal echoes compatible with hemorrhagic
cyst.

Other findings:  No abnormal free fluid.
IMPRESSION: 5.4 cm hemorrhagic cyst in the left ovary. Recommend follow-up US in
3-6 months. Note: This recommendation does not apply to premenarchal
patients or to those with increased risk (genetic, family history,
elevated tumor markers or other high-risk factors) of ovarian

## 2022-02-24 MED ORDER — SODIUM CHLORIDE 0.9 % IV SOLN
10.0000 mL/h | Freq: Once | INTRAVENOUS | Status: AC
  Administered 2022-02-24: 10 mL/h via INTRAVENOUS

## 2022-02-24 MED ORDER — ONDANSETRON HCL 4 MG/2ML IJ SOLN
4.0000 mg | Freq: Once | INTRAMUSCULAR | Status: AC
  Administered 2022-02-24: 4 mg via INTRAVENOUS

## 2022-02-24 MED ORDER — MORPHINE SULFATE (PF) 4 MG/ML IV SOLN
4.0000 mg | Freq: Once | INTRAVENOUS | Status: AC
  Administered 2022-02-24: 4 mg via INTRAVENOUS
  Filled 2022-02-24: qty 1

## 2022-02-24 MED ORDER — METOCLOPRAMIDE HCL 5 MG/ML IJ SOLN
10.0000 mg | Freq: Once | INTRAMUSCULAR | Status: AC
  Administered 2022-02-24: 10 mg via INTRAVENOUS
  Filled 2022-02-24: qty 2

## 2022-02-24 MED ORDER — ONDANSETRON HCL 4 MG/2ML IJ SOLN
INTRAMUSCULAR | Status: AC
  Filled 2022-02-24: qty 2

## 2022-02-24 MED ORDER — ONDANSETRON HCL 4 MG/2ML IJ SOLN
4.0000 mg | Freq: Once | INTRAMUSCULAR | Status: AC
  Administered 2022-02-24: 4 mg via INTRAVENOUS
  Filled 2022-02-24: qty 2

## 2022-02-24 MED ORDER — IOHEXOL 300 MG/ML  SOLN
80.0000 mL | Freq: Once | INTRAMUSCULAR | Status: AC | PRN
  Administered 2022-02-25: 80 mL via INTRAVENOUS

## 2022-02-24 MED ORDER — IOHEXOL 300 MG/ML  SOLN
75.0000 mL | Freq: Once | INTRAMUSCULAR | Status: AC | PRN
  Administered 2022-02-24: 75 mL via INTRAVENOUS

## 2022-02-24 MED ORDER — FENTANYL CITRATE PF 50 MCG/ML IJ SOSY
50.0000 ug | PREFILLED_SYRINGE | Freq: Once | INTRAMUSCULAR | Status: AC
  Administered 2022-02-24: 50 ug via INTRAVENOUS
  Filled 2022-02-24: qty 1

## 2022-02-24 NOTE — Telephone Encounter (Signed)
Transition Care Management Unsuccessful Follow-up Telephone Call ? ?Date of discharge and from where:  02/21/2022-[facility] ? ?Attempts:  3rd Attempt ? ?Reason for unsuccessful TCM follow-up call:  Left voice message ? ?  ?

## 2022-02-24 NOTE — ED Notes (Signed)
Pt actively vomiting in waiting room reports she has been dealing with a headache, neck pain and back pain for 2 weeks, reports developed nausea and vomiting about 3 hrs ago.   ?

## 2022-02-24 NOTE — ED Triage Notes (Signed)
Pt in via EMS from home with c/o back spasms. EMS reports pt seen here for the same and takes oxycodone for pain. ? ?Pt siding her body out of wheelchair and onto floor, pt asked to remain in wheelchair.  ? ?IV to left AC, 18G.  ? ? ?

## 2022-02-24 NOTE — ED Notes (Signed)
Unit of blood ready per blood bank ?

## 2022-02-24 NOTE — ED Provider Notes (Signed)
? ?[facility] ?Provider Note ? ? ? Event Date/Time  ? First MD Initiated Contact with Patient 02/24/22 2032   ?  (approximate) ? ? ?History  ? ?Back Pain and Emesis ? ? ?HPI ? ?Jody Perez is a 31 y.o. female  who, per outside ED note dated from yesterday patient had been evaluated for neck pain, who presents to the emergency department today because of concern for continued neck pain, dizziness. The patient started having neck pain a few weeks ago. Denies any trauma. Has been seen at this emergency department as well as outside facilities. The patient has continued to have the pain and now feels it is going down her neck. The patient says that she has also developed light headedness and weakness.  ? ? ?Physical Exam  ? ?Triage Vital Signs: ?ED Triage Vitals  ?Enc Vitals Group  ?   BP 02/24/22 1855 133/90  ?   Pulse Rate 02/24/22 1855 68  ?   Resp 02/24/22 1855 20  ?   Temp 02/24/22 1855 97.7 ?F (36.5 ?C)  ?   Temp Source 02/24/22 1855 Oral  ?   SpO2 02/24/22 1855 100 %  ?   Weight 02/24/22 1853 170 lb (77.1 kg)  ?   Height 02/24/22 1853 5\' 4"  (1.626 m)  ?   Head Circumference --   ?   Peak Flow --   ?   Pain Score 02/24/22 1853 10  ? ?Most recent vital signs: ?Vitals:  ? 02/24/22 1855 02/24/22 1921  ?BP: 133/90 120/66  ?Pulse: 68 63  ?Resp: 20 16  ?Temp: 97.7 ?F (36.5 ?C) 97.9 ?F (36.6 ?C)  ?SpO2: 100% 100%  ? ? ?General: Awake, no distress.  ?CV:  Good peripheral perfusion.  ?Resp:  Normal effort.  ?Abd:  No distention.  ?MSK:  Tender to palpation of the cervical spine. ?Rectal:  Brown stool on glove. GUIAC negative.  ? ?ED Results / Procedures / Treatments  ? ?Labs ?(all labs ordered are listed, but only abnormal results are displayed) ?Labs Reviewed  ?CBC - Abnormal; Notable for the following components:  ?    Result Value  ? WBC 19.1 (*)   ? RBC 3.08 (*)   ? Hemoglobin 6.7 (*)   ? HCT 24.2 (*)   ? MCV 78.6 (*)   ? MCH 21.8 (*)   ? MCHC 27.7 (*)   ? RDW 17.9 (*)   ? Platelets 525 (*)   ?  All other components within normal limits  ?COMPREHENSIVE METABOLIC PANEL - Abnormal; Notable for the following components:  ? Sodium 133 (*)   ? Glucose, Bld 120 (*)   ? Calcium 8.4 (*)   ? Total Protein 8.2 (*)   ? All other components within normal limits  ?LIPASE, BLOOD  ?CBG MONITORING, ED  ?POC URINE PREG, ED  ?TROPONIN I (HIGH SENSITIVITY)  ? ? ? ?EKG ? ?INance Pear, attending physician, personally viewed and interpreted this EKG ? ?EKG Time: 1921 ?Rate: 69 ?Rhythm: sinus rhythm with pac ?Axis: normal ?Intervals: qtc 465 ?QRS: narrow, q waves II, III, aVF ?ST changes: no st elevation ?Impression: abnormal ekg ? ?RADIOLOGY ?US pelvis ?IMPRESSION:  ?5.4 cm hemorrhagic cyst in the left ovary. Recommend follow-up US in  ?3-6 months. Note: This recommendation does not apply to premenarchal  ?patients or to those with increased risk (genetic, family history,  ?elevated tumor markers or other high-risk factors) of ovarian  ?cancer. Reference: Radiology 2019 Nov; 293(2):359-371.  ? ?  I independently interpreted and visualized the ct soft tissue neck. My interpretation: no large abscess ?Radiology interpretation:  ?IMPRESSION:  ?1. Asymmetric mucosal thickening involving the left pharyngeal  ?mucosa, suspicious for acute pharyngitis. No discrete abscess or  ?drainable fluid collection.  ?2. No other acute abnormality within the neck.  ?   ? ? ? ? ?PROCEDURES: ? ?Critical Care performed: No ? ?Procedures ? ? ?MEDICATIONS ORDERED IN ED: ?Medications  ?ondansetron (ZOFRAN) injection 4 mg (4 mg Intravenous Given 02/24/22 1936)  ? ? ? ?IMPRESSION / MDM / ASSESSMENT AND PLAN / ED COURSE  ?I reviewed the triage vital signs and the nursing notes. ?             ?               ? ?Differential diagnosis includes, but is not limited to, muscle spasm, deep tissue infection. ? ?Patient presented to the emergency department today with concerns for continued and neck pain.  She states it is no going down her back.  Patient was  seen at outside ED yesterday for the same problem.  Patient also however complaining of some dizziness and weakness.  Blood work here was notable for significant anemia of 6.7.  This is a significant decrease from most recent blood work.  Patient states she did have a recent miscarriage although does not sound like she has had very heavy vaginal bleeding since then.  I did obtain a pelvic ultrasound which did not show any retained products of conception.  Did show potentially hemorrhagic cyst, although no large amount of pelvic fluid.  Additionally got a CT scan of the patient's neck looking for deep tissue infection given elevation of the white blood cell count however this did not show any significant infection.  Given patient's level of anemia did consent patient for blood transfusion.  We will check CT abdomen pelvis for possible infection or large collection of blood.  Do think patient will benefit from admission. ? ? ?FINAL CLINICAL IMPRESSION(S) / ED DIAGNOSES  ? ?Final diagnoses:  ?Neck pain  ?Anemia, unspecified type  ? ? ?Note:  This document was prepared using Dragon voice recognition software and may include unintentional dictation errors. ? ?  ?Nance Pear, MD ?02/24/22 2326 ? ?

## 2022-02-25 ENCOUNTER — Emergency Department: Payer: Medicaid Other

## 2022-02-25 ENCOUNTER — Inpatient Hospital Stay: Payer: Medicaid Other

## 2022-02-25 DIAGNOSIS — D649 Anemia, unspecified: Secondary | ICD-10-CM | POA: Diagnosis not present

## 2022-02-25 DIAGNOSIS — D72829 Elevated white blood cell count, unspecified: Secondary | ICD-10-CM

## 2022-02-25 DIAGNOSIS — D509 Iron deficiency anemia, unspecified: Secondary | ICD-10-CM

## 2022-02-25 DIAGNOSIS — Z803 Family history of malignant neoplasm of breast: Secondary | ICD-10-CM | POA: Diagnosis not present

## 2022-02-25 DIAGNOSIS — Z8249 Family history of ischemic heart disease and other diseases of the circulatory system: Secondary | ICD-10-CM | POA: Diagnosis not present

## 2022-02-25 DIAGNOSIS — D72828 Other elevated white blood cell count: Secondary | ICD-10-CM | POA: Diagnosis present

## 2022-02-25 DIAGNOSIS — N83202 Unspecified ovarian cyst, left side: Secondary | ICD-10-CM | POA: Diagnosis present

## 2022-02-25 DIAGNOSIS — Z79899 Other long term (current) drug therapy: Secondary | ICD-10-CM | POA: Diagnosis not present

## 2022-02-25 DIAGNOSIS — N83209 Unspecified ovarian cyst, unspecified side: Secondary | ICD-10-CM | POA: Diagnosis not present

## 2022-02-25 DIAGNOSIS — T380X5A Adverse effect of glucocorticoids and synthetic analogues, initial encounter: Secondary | ICD-10-CM | POA: Diagnosis present

## 2022-02-25 DIAGNOSIS — Z87891 Personal history of nicotine dependence: Secondary | ICD-10-CM | POA: Diagnosis not present

## 2022-02-25 DIAGNOSIS — R42 Dizziness and giddiness: Secondary | ICD-10-CM | POA: Diagnosis present

## 2022-02-25 DIAGNOSIS — M5020 Other cervical disc displacement, unspecified cervical region: Secondary | ICD-10-CM | POA: Diagnosis not present

## 2022-02-25 DIAGNOSIS — Z20822 Contact with and (suspected) exposure to covid-19: Secondary | ICD-10-CM | POA: Diagnosis present

## 2022-02-25 DIAGNOSIS — M50221 Other cervical disc displacement at C4-C5 level: Secondary | ICD-10-CM | POA: Diagnosis present

## 2022-02-25 DIAGNOSIS — M4802 Spinal stenosis, cervical region: Secondary | ICD-10-CM | POA: Diagnosis present

## 2022-02-25 LAB — COMPREHENSIVE METABOLIC PANEL
ALT: 10 U/L (ref 0–44)
AST: 17 U/L (ref 15–41)
Albumin: 3.6 g/dL (ref 3.5–5.0)
Alkaline Phosphatase: 60 U/L (ref 38–126)
Anion gap: 8 (ref 5–15)
BUN: 12 mg/dL (ref 6–20)
CO2: 25 mmol/L (ref 22–32)
Calcium: 8.7 mg/dL — ABNORMAL LOW (ref 8.9–10.3)
Chloride: 101 mmol/L (ref 98–111)
Creatinine, Ser: 0.8 mg/dL (ref 0.44–1.00)
GFR, Estimated: >60 mL/min (ref >60)
Glucose, Bld: 109 mg/dL — ABNORMAL HIGH (ref 70–99)
Potassium: 3.8 mmol/L (ref 3.5–5.1)
Sodium: 134 mmol/L — ABNORMAL LOW (ref 135–145)
Total Bilirubin: 0.4 mg/dL (ref 0.3–1.2)
Total Protein: 7.7 g/dL (ref 6.5–8.1)

## 2022-02-25 LAB — URINALYSIS, ROUTINE W REFLEX MICROSCOPIC
Bacteria, UA: NONE SEEN
RBC / HPF: >50 RBC/hpf — ABNORMAL HIGH (ref 0–5)
Specific Gravity, Urine: 1.037 — ABNORMAL HIGH (ref 1.005–1.030)

## 2022-02-25 LAB — IRON AND TIBC
Iron: 15 ug/dL — ABNORMAL LOW (ref 28–170)
Saturation Ratios: 3 % — ABNORMAL LOW (ref 10.4–31.8)
TIBC: 458 ug/dL — ABNORMAL HIGH (ref 250–450)
UIBC: 443 ug/dL

## 2022-02-25 LAB — CBC
HCT: 28.8 % — ABNORMAL LOW (ref 36.0–46.0)
Hemoglobin: 8.4 g/dL — ABNORMAL LOW (ref 12.0–15.0)
MCH: 23.2 pg — ABNORMAL LOW (ref 26.0–34.0)
MCHC: 29.2 g/dL — ABNORMAL LOW (ref 30.0–36.0)
MCV: 79.6 fL — ABNORMAL LOW (ref 80.0–100.0)
Platelets: 549 10*3/uL — ABNORMAL HIGH (ref 150–400)
RBC: 3.62 MIL/uL — ABNORMAL LOW (ref 3.87–5.11)
RDW: 18.1 % — ABNORMAL HIGH (ref 11.5–15.5)
WBC: 23.9 10*3/uL — ABNORMAL HIGH (ref 4.0–10.5)
nRBC: 0.1 % (ref 0.0–0.2)

## 2022-02-25 LAB — RESP PANEL BY RT-PCR (FLU A&B, COVID) ARPGX2
Influenza A by PCR: NEGATIVE
Influenza B by PCR: NEGATIVE
SARS Coronavirus 2 by RT PCR: NEGATIVE

## 2022-02-25 LAB — TSH: TSH: 1.405 u[IU]/mL (ref 0.350–4.500)

## 2022-02-25 LAB — FERRITIN: Ferritin: 6 ng/mL — ABNORMAL LOW (ref 11–307)

## 2022-02-25 LAB — GROUP A STREP BY PCR: Group A Strep by PCR: NOT DETECTED

## 2022-02-25 LAB — PREPARE RBC (CROSSMATCH)

## 2022-02-25 IMAGING — CT CT ABD-PELV W/ CM
2 of 4 series · 15 of 46 positions shown, 17 images · IV contrast (agent unspecified)
Comparison: None.

CLINICAL DATA: Nausea.

EXAM:
CT ABDOMEN AND PELVIS WITH CONTRAST
TECHNIQUE: Multidetector CT imaging of the abdomen and pelvis was performed
using the standard protocol following bolus administration of
intravenous contrast.

[Series 2: routine abd/pel with · axial · 0.84mm/px · z∈[-709,-244]mm · 12 of 103 slices shown, 14 images]
[im 5/103  soft-tissue]
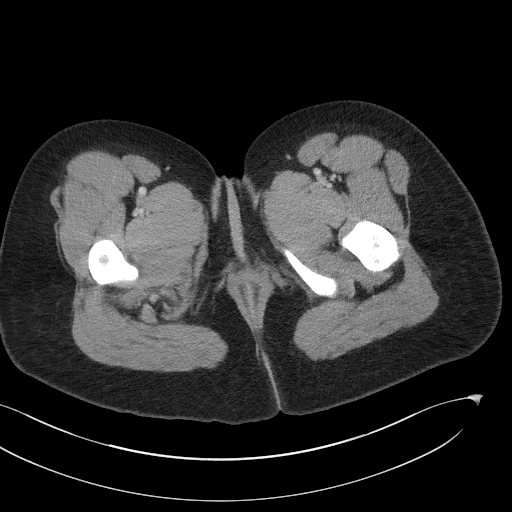
[im 5/103  bone]
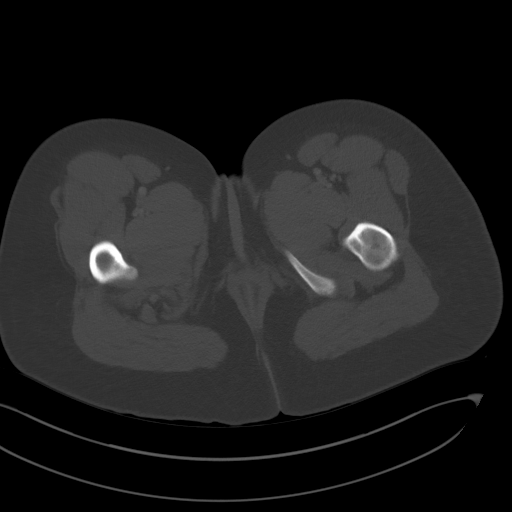
[im 14/103  soft-tissue]
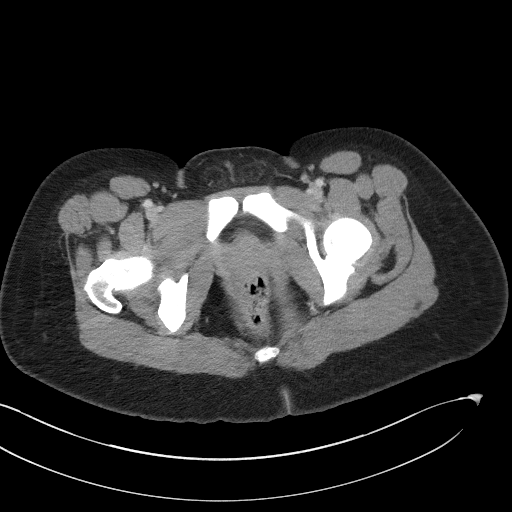
[im 23/103  soft-tissue]
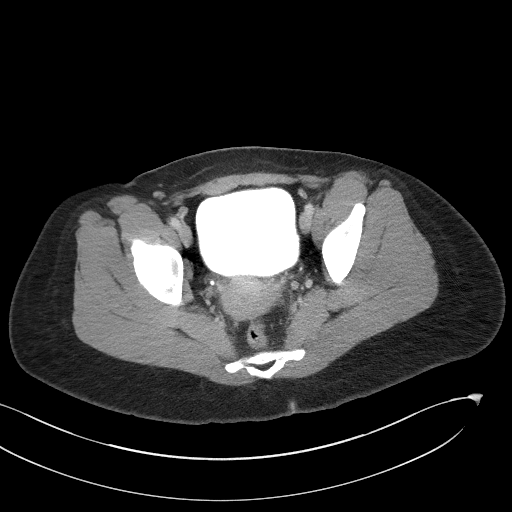
[im 32/103  soft-tissue]
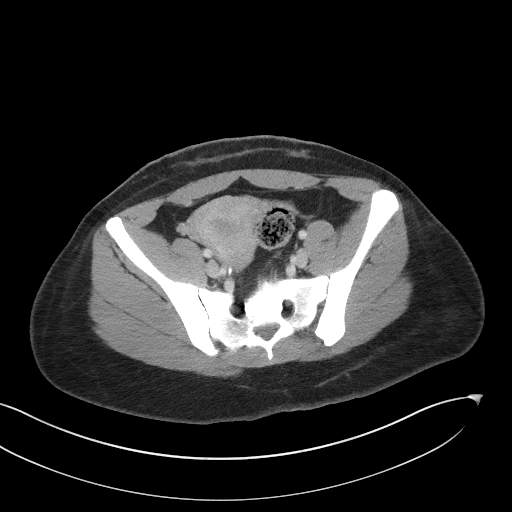
[im 40/103  soft-tissue]
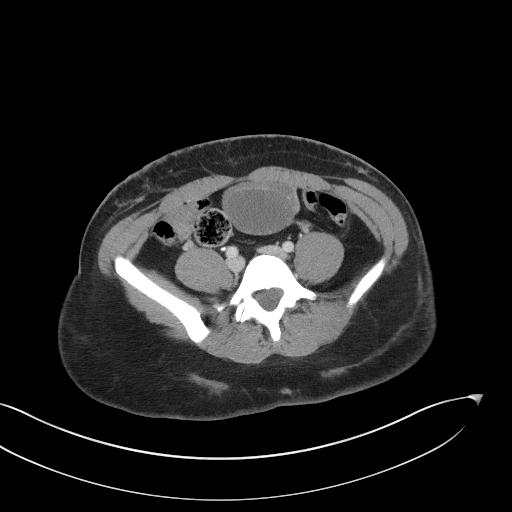
[im 49/103  soft-tissue]
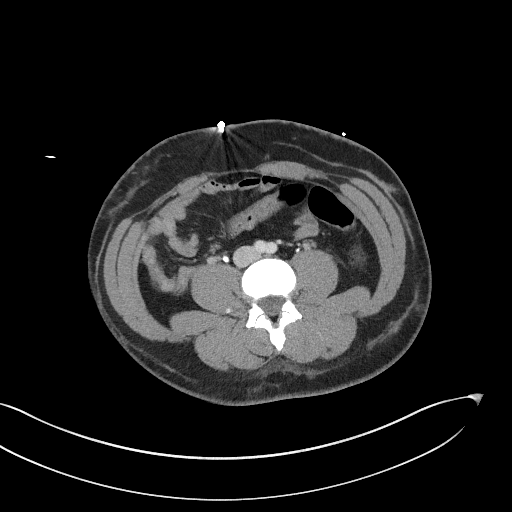
[im 54/103  soft-tissue]
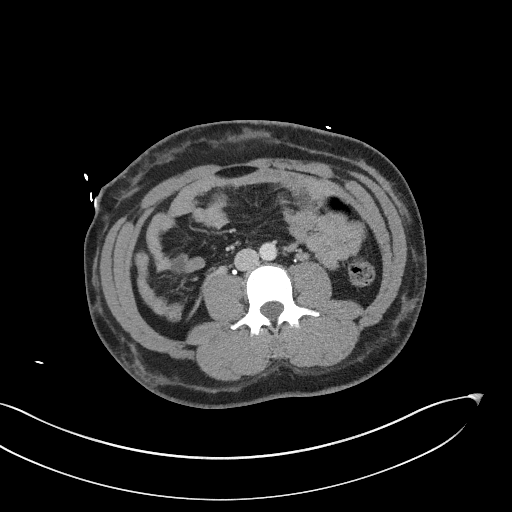
[im 63/103  soft-tissue]
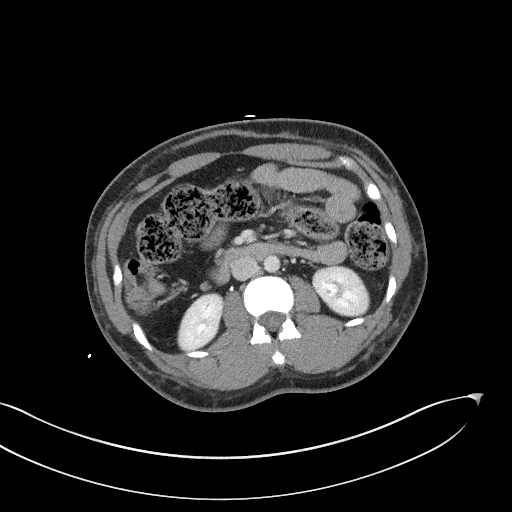
[im 71/103  soft-tissue]
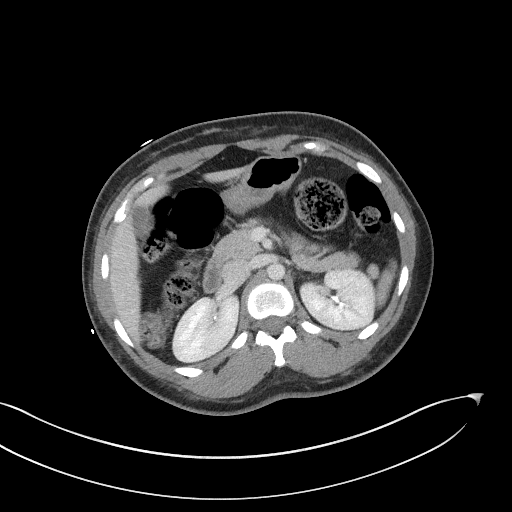
[im 71/103  bone]
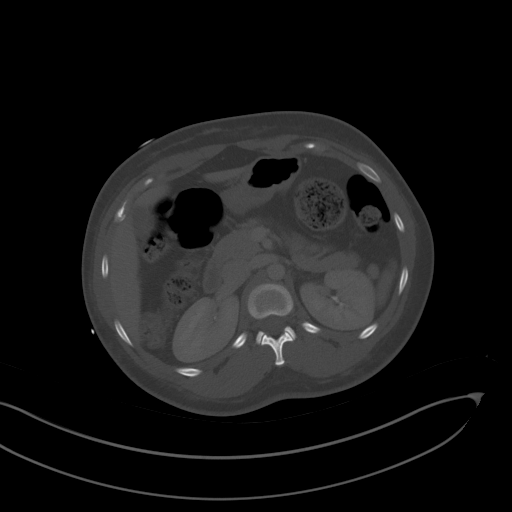
[im 80/103  soft-tissue]
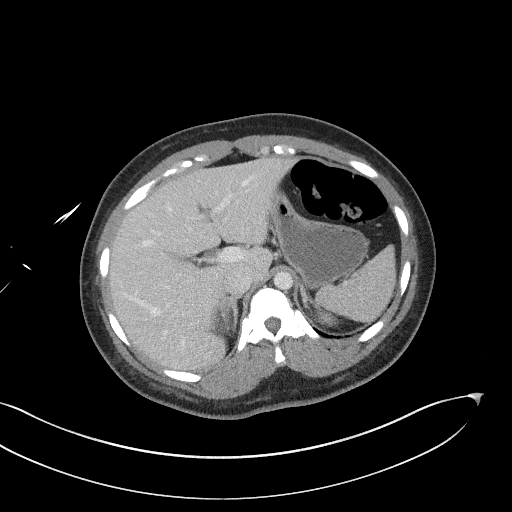
[im 89/103  soft-tissue]
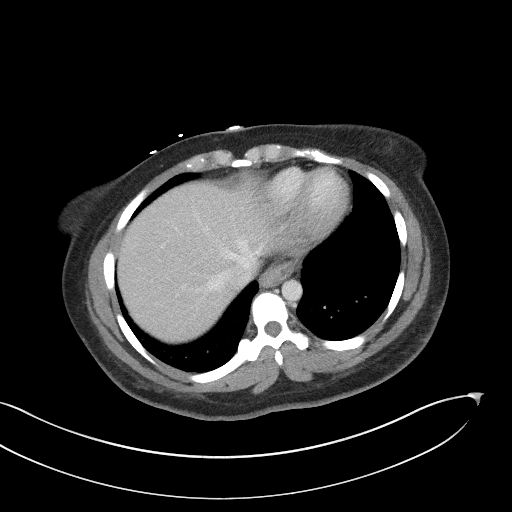
[im 98/103  soft-tissue]
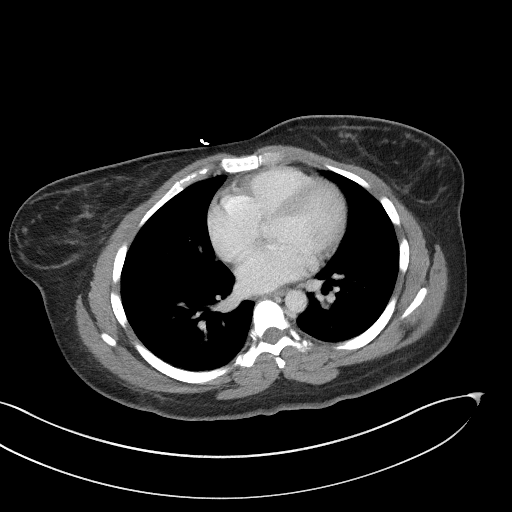

[Series 5: coronal st · coronal · 0.62mm/px · 3 of 92 slices shown]
[im 31/92  soft-tissue]
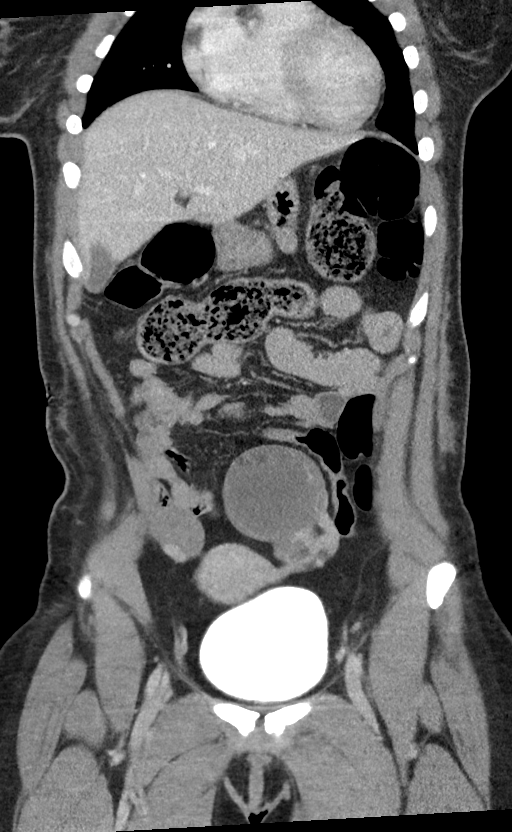
[im 41/92  soft-tissue]
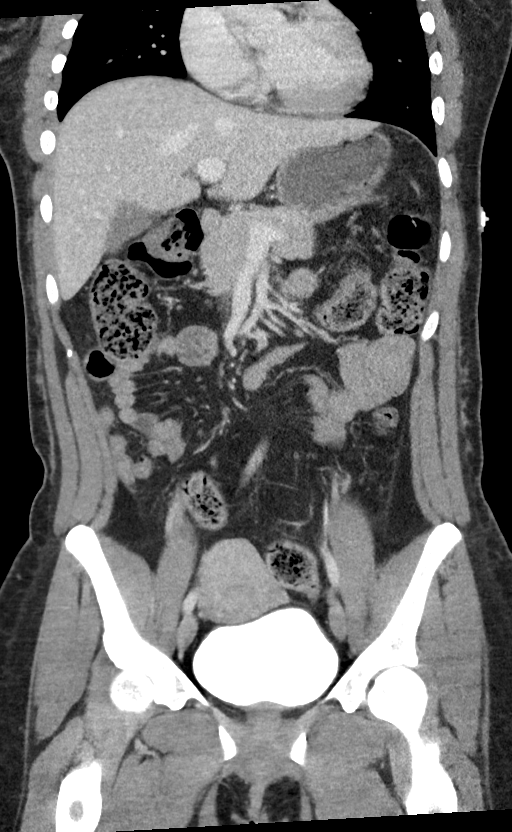
[im 51/92  soft-tissue]
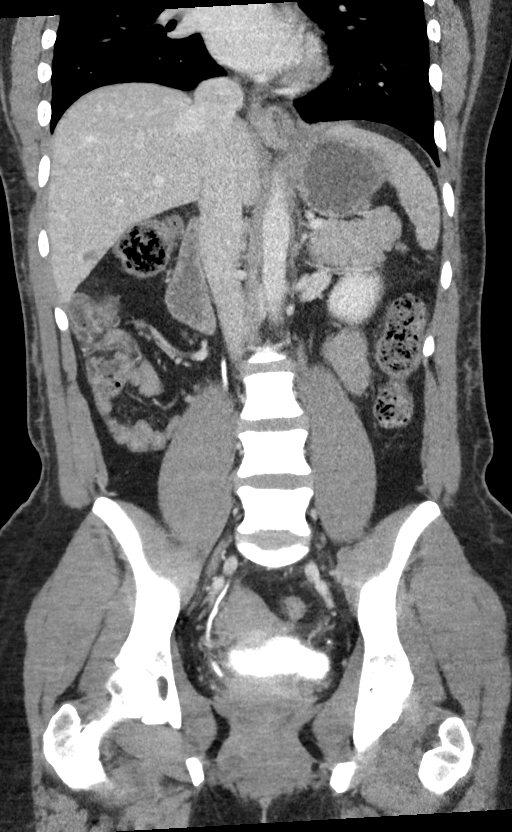

[15 of 46 positions shown; findings below may reference images not displayed]

RADIATION DOSE REDUCTION: This exam was performed according to the
departmental dose-optimization program which includes automated
exposure control, adjustment of the mA and/or kV according to
patient size and/or use of iterative reconstruction technique.

CONTRAST:  80mL OMNIPAQUE IOHEXOL 300 MG/ML  SOLN
FINDINGS: Lower chest: Lung bases are clear.

Hepatobiliary: Subcentimeter low-attenuation lesion in segment 6 of
the liver is too small to characterize but likely represents a cyst
or hemangioma. No other focal lesions identified. Gallbladder and
bile ducts are unremarkable.

Pancreas: Unremarkable. No pancreatic ductal dilatation or
surrounding inflammatory changes.

Spleen: Normal in size without focal abnormality.

Adrenals/Urinary Tract: Adrenal glands are unremarkable. Kidneys are
normal, without renal calculi, focal lesion, or hydronephrosis.
Contrast material fills the bladder. No filling defects or wall
thickening.

Stomach/Bowel: Stomach, small bowel, and colon are not abnormally
distended. No wall thickening or inflammatory changes. Appendix is
normal.

Vascular/Lymphatic: No significant vascular findings are present. No
enlarged abdominal or pelvic lymph nodes.

Reproductive: Uterus is not enlarged. Left ovarian cystic lesion
measuring 6.1 by 4 cm. Right ovary is normal in size.

Other: No abdominal wall hernia or abnormality. No abdominopelvic
ascites. Mild edema in the subcutaneous fat.

Musculoskeletal: No acute or significant osseous findings.
IMPRESSION: 1. No evidence of bowel obstruction or inflammation.
2. 6.1 cm cyst in the left ovary. This was evaluated ultrasound
yesterday with features compatible with hemorrhagic cyst. See
additional report of ultrasound pelvis [DATE].

## 2022-02-25 IMAGING — MR MR HEAD W/O CM
11 series · 48 of 48 positions shown · non-contrast
Comparison: No prior MRI, correlation is made with [DATE] CT
head

CLINICAL DATA: Dizziness, neck pain

EXAM:
MRI HEAD WITHOUT CONTRAST
TECHNIQUE: Multiplanar, multiecho pulse sequences of the brain and surrounding
structures were obtained without intravenous contrast.

[Series 5: ax dwi_tracew · axial · 3.0mm · 0.71mm/px · z∈[-86,+68]mm · 5 of 56 slices shown]
[im 1/56]
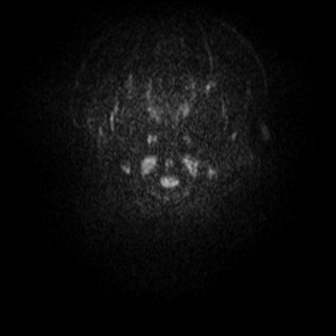
[im 14/56]
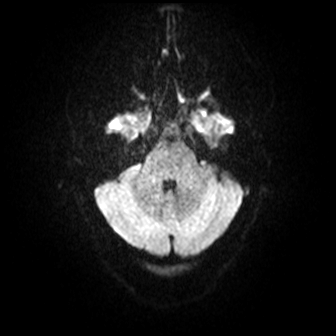
[im 28/56]
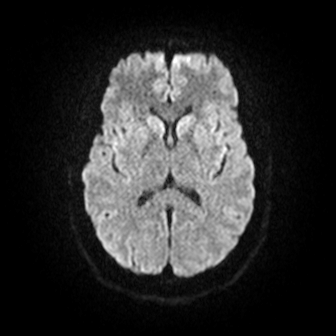
[im 42/56]
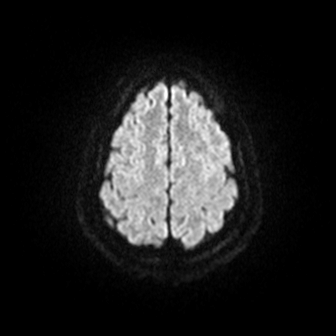
[im 56/56]
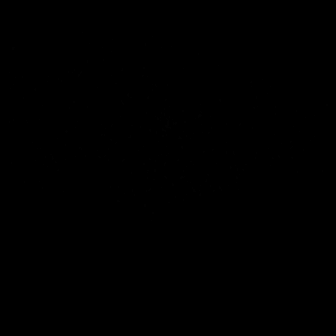

[Series 7: cor dwi_tracew · coronal · 5.0mm · 0.68mm/px · 3 of 40 slices shown]
[im 1/40]
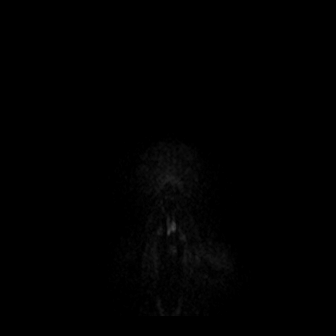
[im 20/40]
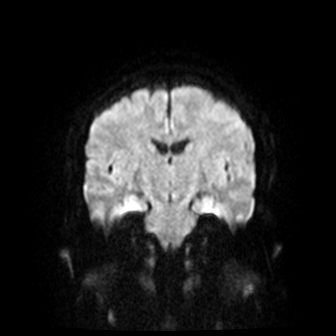
[im 40/40]
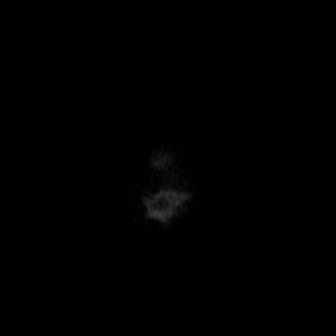

[Series 9: T1 · sagittal · 5.0mm · 0.47mm/px · 2 of 24 slices shown (1 of 2)]
[im 1/24]
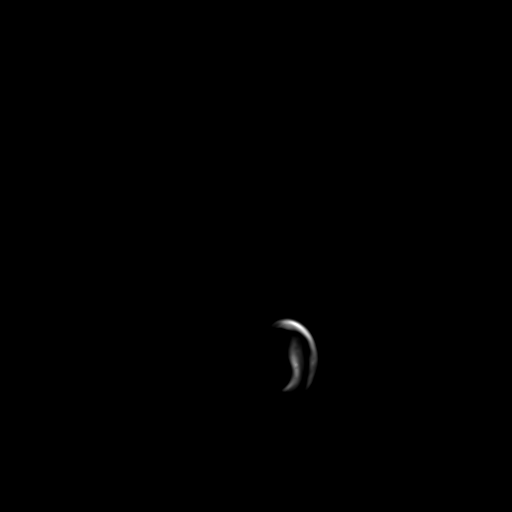
[im 24/24]
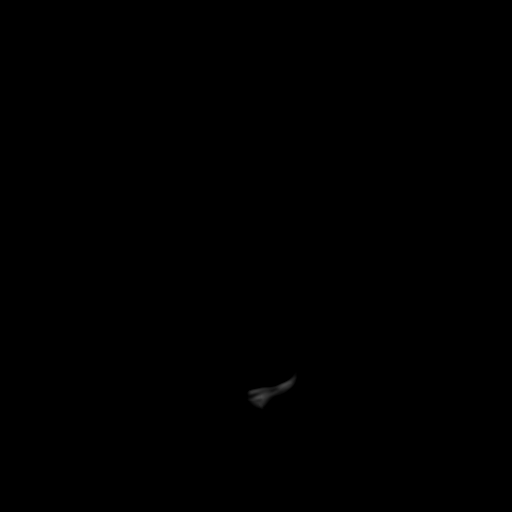

[Series 10: T2 · axial · 5.0mm · 0.86mm/px · z∈[-75,+59]mm · 2 of 25 slices shown (1 of 2)]
[im 1/25]
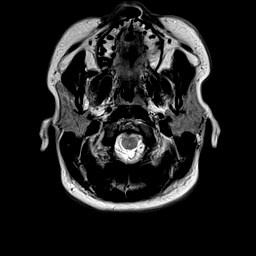
[im 25/25]
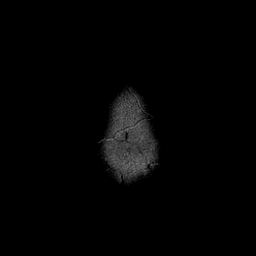

[Series 11: ax swi_mag · axial · 3.0mm · 0.90mm/px · z∈[-87,+67]mm · 4 of 56 slices shown]
[im 1/56]
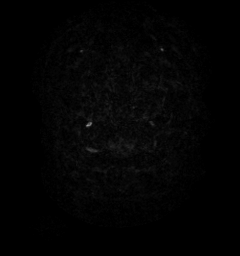
[im 19/56]
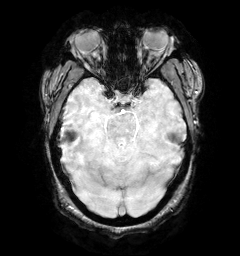
[im 37/56]
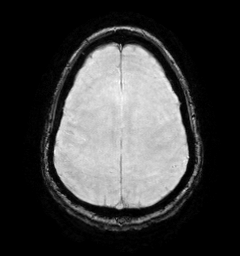
[im 56/56]
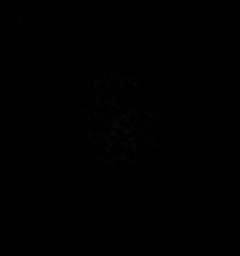

[Series 12: ax swi_pha · axial · 3.0mm · 0.90mm/px · z∈[-87,+67]mm · 4 of 56 slices shown]
[im 1/56]
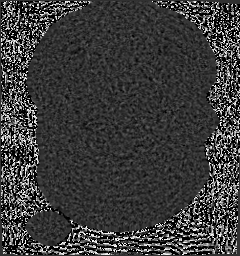
[im 19/56]
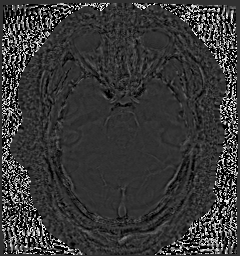
[im 37/56]
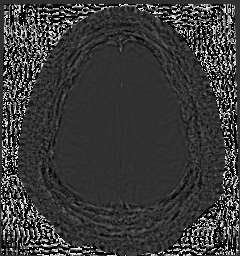
[im 56/56]
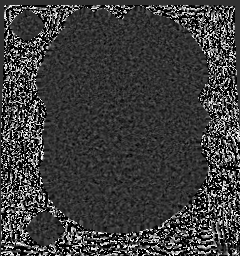

[Series 13: ax swi_swi · axial · 3.0mm · 0.90mm/px · z∈[-87,+67]mm · 4 of 56 slices shown]
[im 1/56]
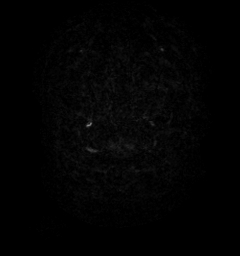
[im 19/56]
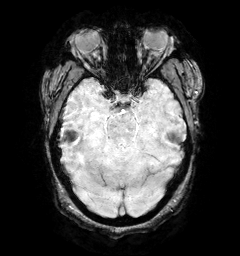
[im 37/56]
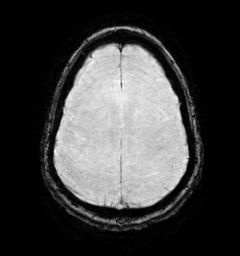
[im 56/56]
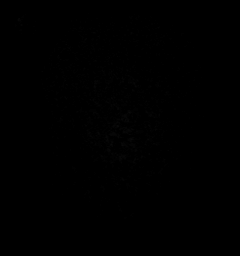

[Series 14: ax swi_swi_mip · axial · 24.0mm · 0.90mm/px · z∈[-77,+57]mm · 4 of 49 slices shown]
[im 1/49]
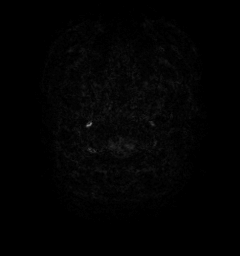
[im 17/49]
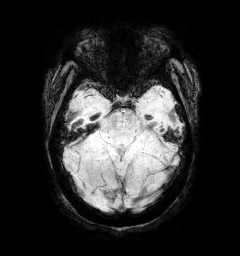
[im 33/49]
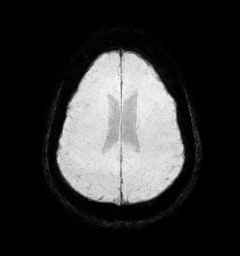
[im 49/49]
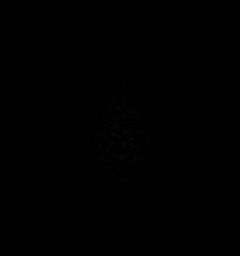

[Series 15: FLAIR · axial · 3.0mm · 0.69mm/px · z∈[-84,+67]mm · 4 of 55 slices shown]
[im 1/55]
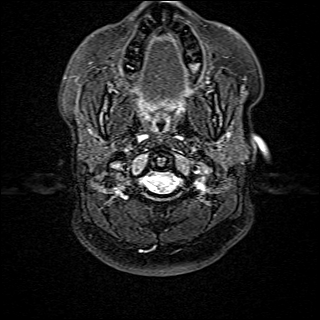
[im 19/55]
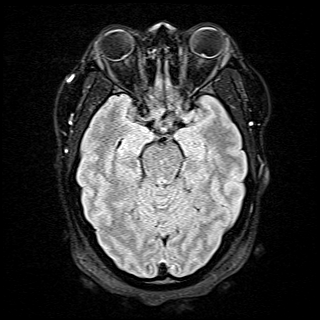
[im 37/55]
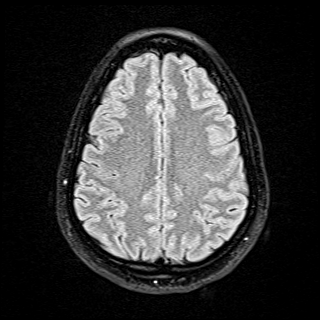
[im 55/55]
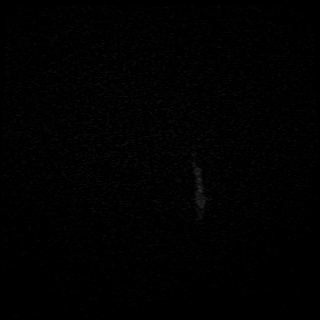

[Series 16: T1 · axial · 1.0mm · 0.98mm/px · z∈[-95,+68]mm · 14 of 176 slices shown (2 of 2)]
[im 1/176]
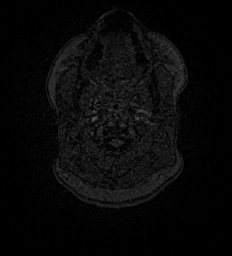
[im 14/176]
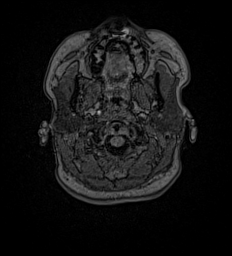
[im 27/176]
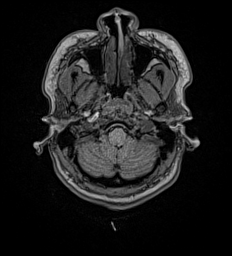
[im 41/176]
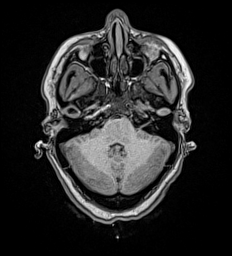
[im 54/176]
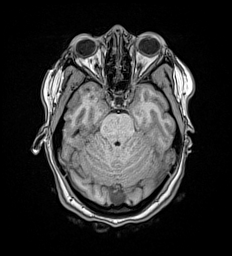
[im 68/176]
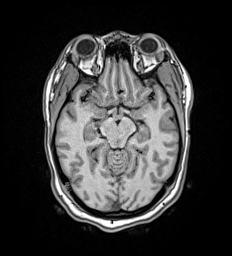
[im 81/176]
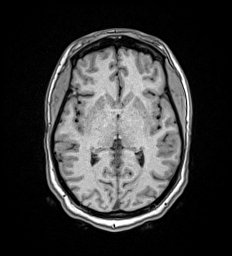
[im 95/176]
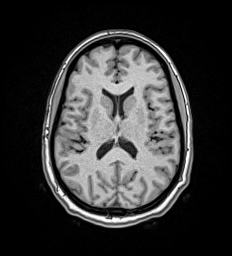
[im 108/176]
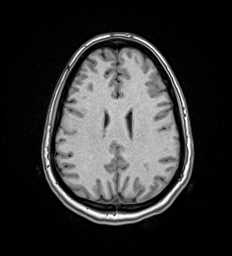
[im 122/176]
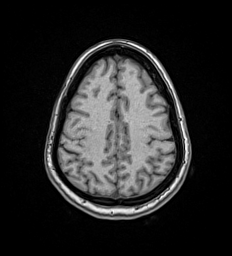
[im 135/176]
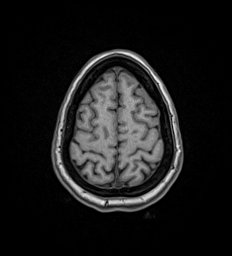
[im 149/176]
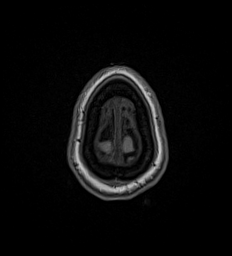
[im 162/176]
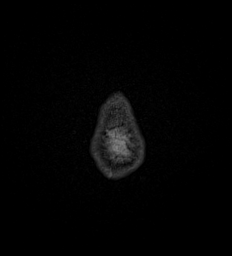
[im 176/176]
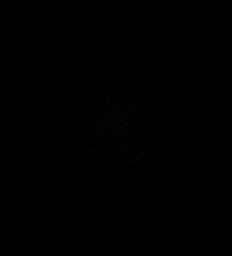

[Series 17: T2 · coronal · 5.0mm · 0.86mm/px · 2 of 30 slices shown (2 of 2)]
[im 1/30]
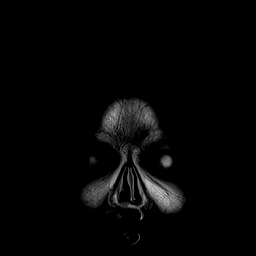
[im 30/30]
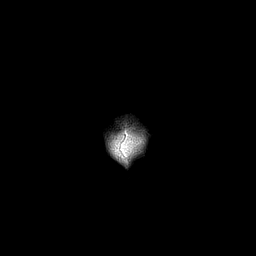

[48 of 48 positions shown; findings below may reference images not displayed]

FINDINGS: Brain: No restricted diffusion to suggest acute or subacute infarct.
No acute hemorrhage, mass, mass effect, or midline shift. Cerebral
volume is within normal limits for age. No hydrocephalus or
extra-axial collection. No abnormal T2 hyperintense signal in the
periventricular white matter.

Vascular: Normal flow voids.

Skull and upper cervical spine: Normal marrow signal.

Sinuses/Orbits: Negative.

Other: The mastoids are well aerated. Incidental note is made of a
small Tornwaldt cyst in the left nasopharynx.
IMPRESSION: No acute intracranial process. No etiology seen for the patient's
dizziness.

## 2022-02-25 IMAGING — MR MR CERVICAL SPINE WO/W CM
5 of 8 series · 29 of 48 positions shown · IV contrast (7ml Gadavist)
Comparison: No prior MRI, correlation is made with CT neck
[DATE]

CLINICAL DATA: Neck pain

EXAM:
MRI CERVICAL SPINE WITHOUT AND WITH CONTRAST
TECHNIQUE: Multiplanar and multiecho pulse sequences of the cervical spine, to
include the craniocervical junction and cervicothoracic junction,
were obtained without and with intravenous contrast.
CONTRAST:  7.5mL GADAVIST GADOBUTROL 1 MMOL/ML IV SOLN

[Series 5: T2 · sagittal · 3.0mm · 0.62mm/px · 4 of 15 slices shown (1 of 2)]
[im 1/15]
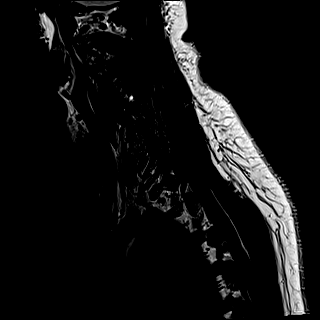
[im 5/15]
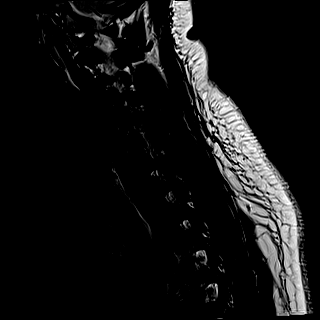
[im 10/15]
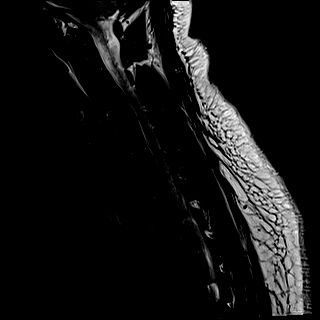
[im 15/15]
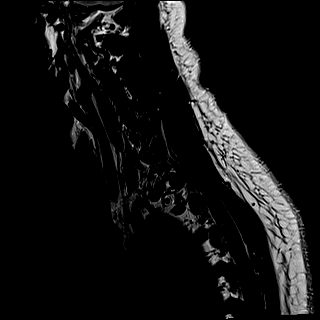

[Series 7: STIR · sagittal · 3.0mm · 0.62mm/px · 4 of 15 slices shown]
[im 1/15]
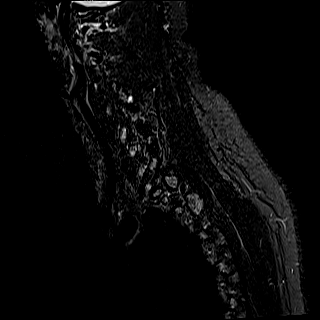
[im 5/15]
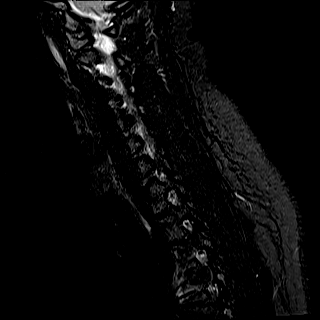
[im 10/15]
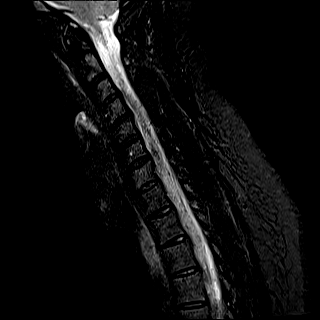
[im 15/15]
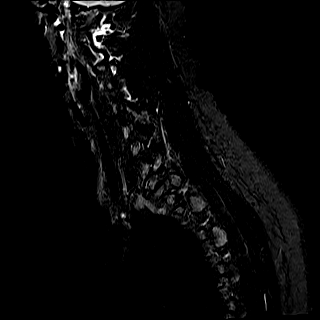

[Series 8: T2 · axial · 3.0mm · 0.70mm/px · z∈[-182,-94]mm · 8 of 29 slices shown (2 of 2)]
[im 1/29]
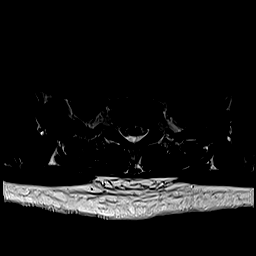
[im 5/29]
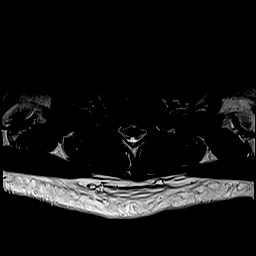
[im 9/29]
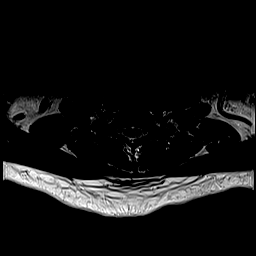
[im 13/29]
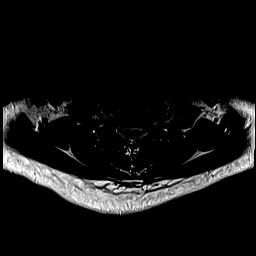
[im 17/29]
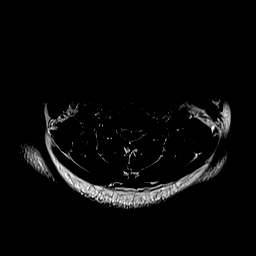
[im 21/29]
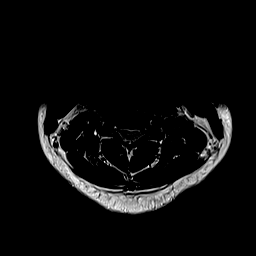
[im 25/29]
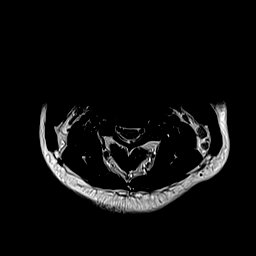
[im 29/29]
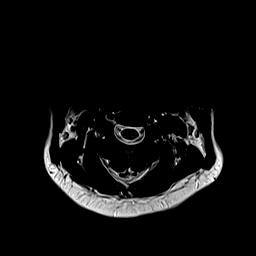

[Series 10: T1 · axial · non-contrast · 3.0mm · 0.35mm/px · z∈[-182,-94]mm · 8 of 29 slices shown]
[im 1/29]
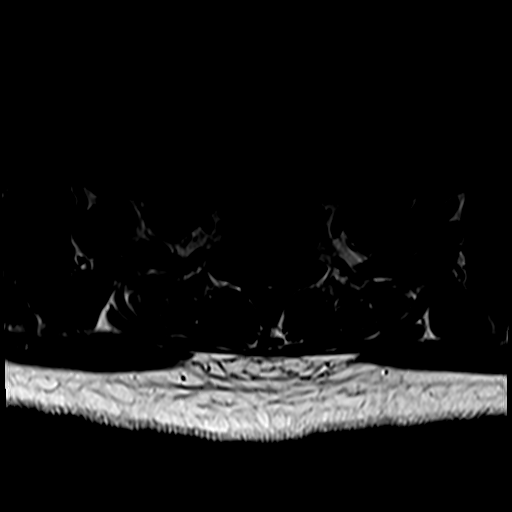
[im 5/29]
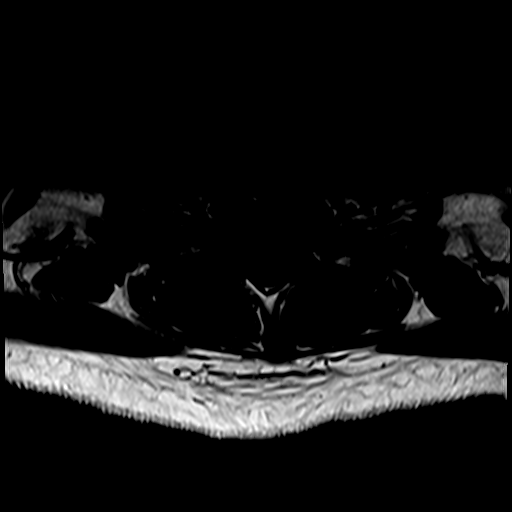
[im 9/29]
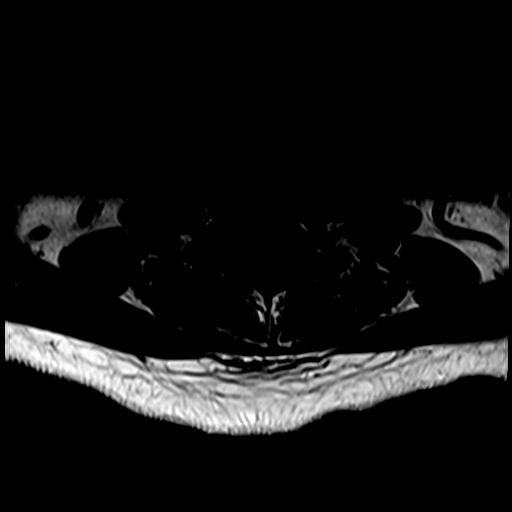
[im 13/29]
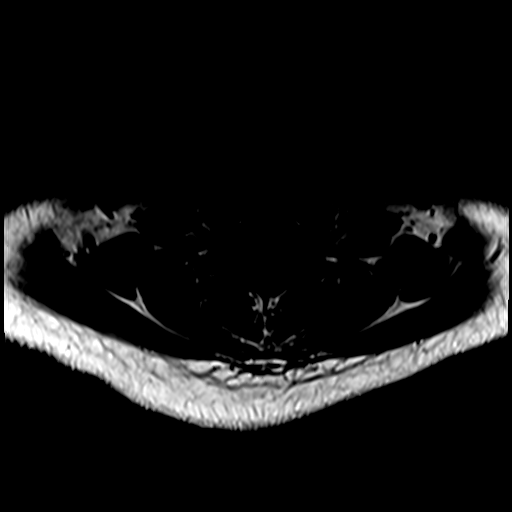
[im 17/29]
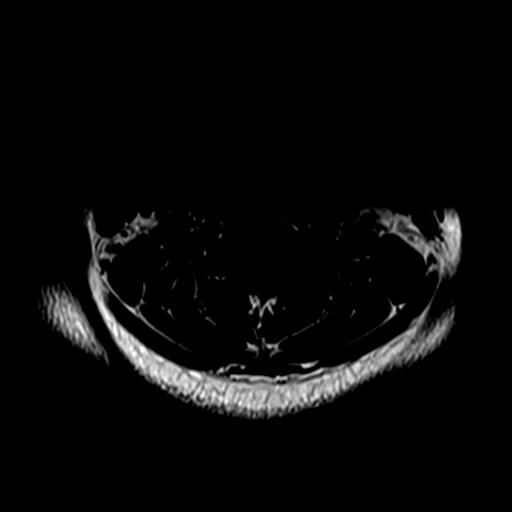
[im 21/29]
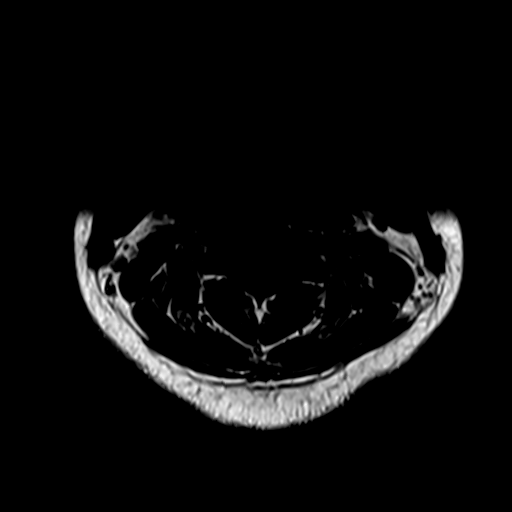
[im 25/29]
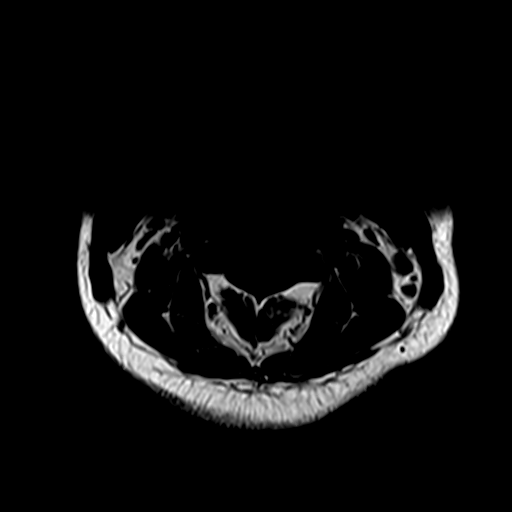
[im 29/29]
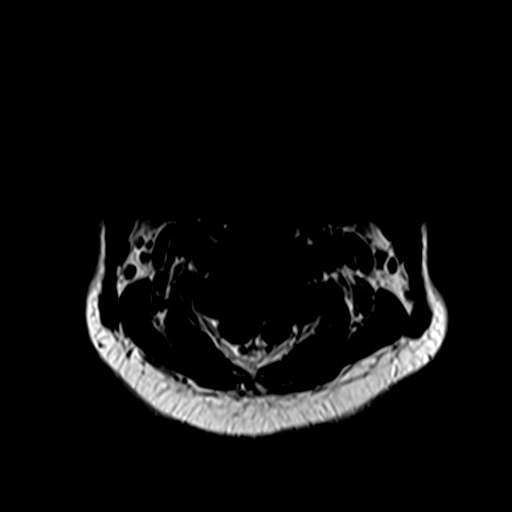

[Series 12: T1 post-contrast · axial · 3.0mm · 0.35mm/px · z∈[-182,-131]mm · 5 of 29 slices shown]
[im 1/29]
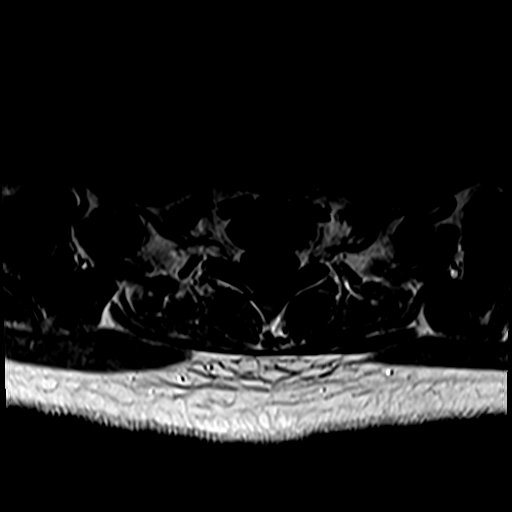
[im 5/29]
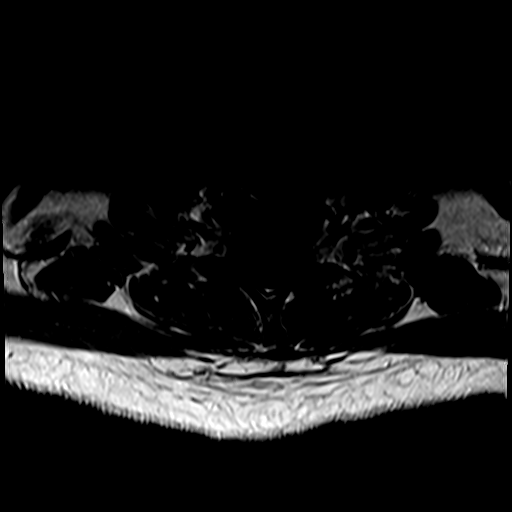
[im 9/29]
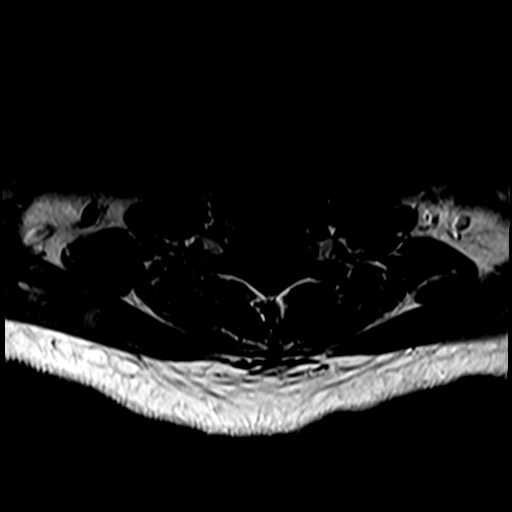
[im 13/29]
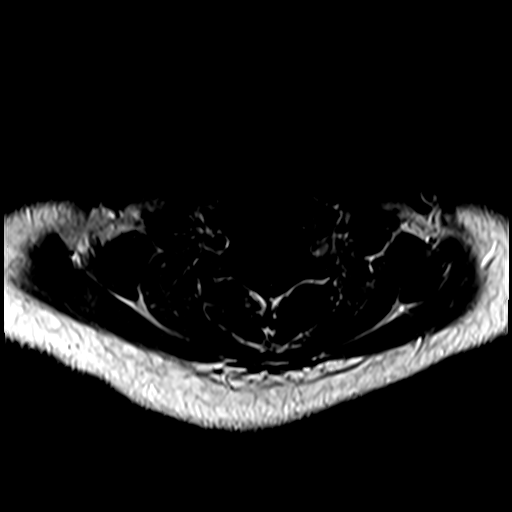
[im 17/29]
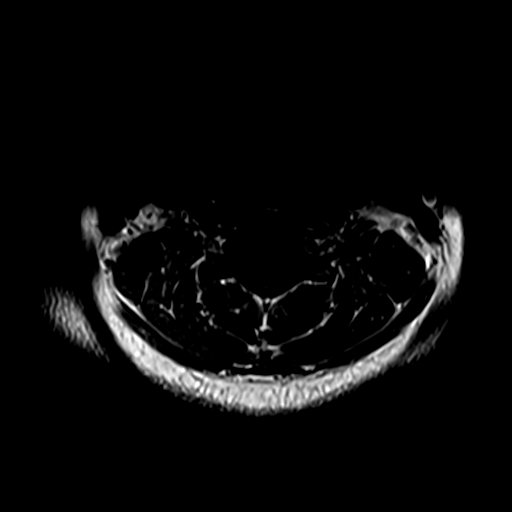

[29 of 48 positions shown; findings below may reference images not displayed]

FINDINGS: Alignment: Straightening and mild reversal of the normal cervical
lordosis. No listhesis.

Vertebrae: No acute fracture or suspicious osseous lesion. No
abnormal enhancement.

Cord: Normal signal and morphology.  No abnormal enhancement.

Posterior Fossa, vertebral arteries, paraspinal tissues: Negative.

Disc levels:

C2-C3: No significant disc bulge. No spinal canal stenosis or
neuroforaminal narrowing.

C3-C4: No significant disc bulge. No spinal canal stenosis or
neuroforaminal narrowing.

C4-C5: Disc bulge with superimposed central protrusion, which
indents the ventral spinal cord. Mild uncovertebral hypertrophy.
Moderate spinal canal stenosis. Mild-to-moderate bilateral neural
foraminal narrowing.

C5-C6: Mild disc bulge. No spinal canal stenosis or neural foraminal
narrowing.

C6-C7: No significant disc bulge. No spinal canal stenosis or
neuroforaminal narrowing.

C7-T1: No significant disc bulge. No spinal canal stenosis or
neuroforaminal narrowing.
IMPRESSION: 1. C4-C5 moderate spinal canal stenosis and mild-to-moderate
bilateral neural foraminal narrowing.
2. No abnormal osseous or spinal cord enhancement. No cord signal
abnormality.

## 2022-02-25 MED ORDER — ACETAMINOPHEN 650 MG RE SUPP: 650.0000 mg | Freq: Four times a day (QID) | RECTAL | Status: DC | PRN

## 2022-02-25 MED ORDER — HYDROCODONE-ACETAMINOPHEN 5-325 MG PO TABS
1.0000 | ORAL_TABLET | ORAL | Status: DC | PRN
  Administered 2022-02-25 (×2): 2 via ORAL
  Administered 2022-02-25 (×2): 1 via ORAL
  Administered 2022-02-25 – 2022-02-26 (×3): 2 via ORAL
  Filled 2022-02-25 (×3): qty 2
  Filled 2022-02-25: qty 1
  Filled 2022-02-25 (×3): qty 2

## 2022-02-25 MED ORDER — SODIUM CHLORIDE 0.9 % IV SOLN
400.0000 mg | Freq: Once | INTRAVENOUS | Status: AC
  Administered 2022-02-25: 400 mg via INTRAVENOUS
  Filled 2022-02-25: qty 20

## 2022-02-25 MED ORDER — DEXAMETHASONE SODIUM PHOSPHATE 10 MG/ML IJ SOLN
10.0000 mg | Freq: Three times a day (TID) | INTRAMUSCULAR | Status: DC
  Administered 2022-02-25 – 2022-02-26 (×3): 10 mg via INTRAVENOUS
  Filled 2022-02-25 (×4): qty 1

## 2022-02-25 MED ORDER — SODIUM CHLORIDE 0.9 % IV SOLN
3.0000 g | Freq: Four times a day (QID) | INTRAVENOUS | Status: DC
  Administered 2022-02-25 – 2022-02-26 (×5): 3 g via INTRAVENOUS
  Filled 2022-02-25: qty 8
  Filled 2022-02-25: qty 3
  Filled 2022-02-25: qty 8
  Filled 2022-02-25 (×4): qty 3

## 2022-02-25 MED ORDER — ONDANSETRON HCL 4 MG PO TABS
4.0000 mg | ORAL_TABLET | Freq: Four times a day (QID) | ORAL | Status: DC | PRN
Start: 2022-02-25 — End: 2022-02-26

## 2022-02-25 MED ORDER — ACETAMINOPHEN 325 MG PO TABS: 650.0000 mg | ORAL_TABLET | Freq: Four times a day (QID) | ORAL | Status: DC | PRN

## 2022-02-25 MED ORDER — GADOBUTROL 1 MMOL/ML IV SOLN
7.0000 mL | Freq: Once | INTRAVENOUS | Status: AC | PRN
  Administered 2022-02-25: 7.5 mL via INTRAVENOUS

## 2022-02-25 MED ORDER — ALBUTEROL SULFATE (2.5 MG/3ML) 0.083% IN NEBU
2.5000 mg | INHALATION_SOLUTION | RESPIRATORY_TRACT | Status: DC | PRN
Start: 2022-02-25 — End: 2022-02-26

## 2022-02-25 MED ORDER — SODIUM CHLORIDE 0.9 % IV SOLN
3.0000 g | Freq: Once | INTRAVENOUS | Status: AC
  Administered 2022-02-25: 3 g via INTRAVENOUS
  Filled 2022-02-25: qty 8

## 2022-02-25 MED ORDER — ONDANSETRON HCL 4 MG/2ML IJ SOLN
4.0000 mg | Freq: Four times a day (QID) | INTRAMUSCULAR | Status: DC | PRN
  Administered 2022-02-25 (×3): 4 mg via INTRAVENOUS
  Filled 2022-02-25 (×3): qty 2

## 2022-02-25 NOTE — Assessment & Plan Note (Addendum)
Hemorrhagic left ovarian cyst.  Recent miscarriage. No vaginal bleeding.  Will need follow-up as outpatient.  Patient on empiric antibiotics.  Given Unasyn here and will switch over to Augmentin upon discharge.  Elevated white count likely secondary to steroids ?

## 2022-02-25 NOTE — Assessment & Plan Note (Addendum)
Elevated white count likely secondary to recent steroid course and then steroids here in the hospital ?

## 2022-02-25 NOTE — Assessment & Plan Note (Addendum)
Hemoglobin 6.7 on presentation and up to 8.4 after transfusion.  Ferritin low at 6 going along with iron deficiency anemia.  I gave IV Venofer yesterday on 02/25/2022.  Recommend checking CBC as outpatient. ?

## 2022-02-25 NOTE — Plan of Care (Signed)
  Problem: Clinical Measurements: Goal: Ability to maintain clinical measurements within normal limits will improve Outcome: Progressing   

## 2022-02-25 NOTE — Assessment & Plan Note (Addendum)
Patient has a herniated disc causing cervical stenosis at C4-C5.  We will give IV Decadron while here and switch over to prednisone taper upon discharge. ?

## 2022-02-25 NOTE — Plan of Care (Signed)
  Problem: Clinical Measurements: Goal: Diagnostic test results will improve Outcome: Progressing   Problem: Coping: Goal: Level of anxiety will decrease Outcome: Progressing   Problem: Pain Managment: Goal: General experience of comfort will improve Outcome: Progressing   

## 2022-02-25 NOTE — ED Notes (Signed)
Pt to CT via stretcher, accompanied by Nettie Elm, Charity fundraiser.  ?

## 2022-02-25 NOTE — H&P (Signed)
?History and Physical  ? ? Jody Perez XTG:[id number] DOB: 1991/01/12 DOA: 02/24/2022 ? ?PCP: Erasmo Downer, MD  ?Patient coming from: home ? ?I have personally briefly reviewed patient's old medical records in [facility] ? ?Chief Complaint: neck pain , dizziness ? ?HPI: Jody Perez is a 31 y.o. female with medical history significant of  ?migraines, Anemia, hx of STD HSV/Chlamydia recent history of miscarriage around 4-6 weeks ago who presents to ED with complaint of neck and dizziness. Of note patient has been seen  3 times prior over the last mo for similar complaints with last  ED visit  3/30 at [facility] at which  time patient was diagnosed with muscular neck pain  and discharged with supportive care. Patient now return with persistent complaint of neck pain  now associated with dizziness.   ? ?ED Course:  ?On evaluation patient was found have anemia with hbg of 6.7 down from 10 and soft tissue CT neck with left pharyngeal inflammation w/o abscess.suggestive of acute pharyngitis   ?Vitals: afebrile,  bp 133/90, hr 68, rr 20 sat 100%  ?EKG: sinus with PAC  ?Labs ?Wbc 19.9, hgb 6.7 down from 10 2/23 , plt 595 mcv 77 ?CT soft tissue: ?[name] Asymmetric mucosal thickening involving the left pharyngeal ?mucosa, suspicious for acute pharyngitis. No discrete abscess or ?drainable fluid collection. ?2. No other acute abnormality within the neck. ? ?Tx with prbc and unasyn ? ?Review of Systems: As per HPI otherwise 10 point review of systems negative.  ? ?Past Medical History:  ?Diagnosis Date  ? BV (bacterial vaginosis)   ? Chlamydia   ? Herpes genitalis in women   ? HSV infection   ? Migraines   ? Miscarriage   ? Trichomonas   ? UTI (lower urinary tract infection)   ? ? ?Past Surgical History:  ?Procedure Laterality Date  ? LIPOSUCTION  09/01/2021  ? WISDOM TOOTH EXTRACTION    ? ? ? reports that she has quit smoking. Her smoking use included e-cigarettes and cigars. She has never been exposed to tobacco smoke. She  has never used smokeless tobacco. She reports that she does not currently use alcohol after a past usage of about 4.0 standard drinks per week. She reports that she does not currently use drugs after having used the following drugs: Marijuana. ? ?No Known Allergies ? ?Family History  ?Problem Relation Age of Onset  ? Hypertension Mother   ? Cancer Mother   ?     Pitutary  ? Healthy Brother   ? Hypertension Brother   ? Healthy Father   ? Breast cancer Maternal Grandmother   ? Healthy Brother   ? Colon cancer Neg Hx   ? Ovarian cancer Neg Hx   ? Cervical cancer Neg Hx   ? ? ?Prior to Admission medications   ?Medication Sig Start Date End Date Taking? Authorizing Provider  ?cyclobenzaprine (FLEXERIL) 10 MG tablet Take 1 tablet (10 mg total) by mouth 3 (three) times daily as needed. 02/21/22   Sherrie Mustache Roselyn Bering, PA-C  ?meloxicam (MOBIC) 15 MG tablet Take 1 tablet (15 mg total) by mouth daily. 02/14/22 02/14/23  Tommi Rumps, PA-C  ?ondansetron (ZOFRAN-ODT) 4 MG disintegrating tablet Take 1 tablet (4 mg total) by mouth every 8 (eight) hours as needed. 02/21/22   Fisher, Roselyn Bering, PA-C  ?oxyCODONE-acetaminophen (PERCOCET) 5-325 MG tablet Take 1 tablet by mouth every 6 (six) hours as needed for severe pain. 02/14/22 02/14/23  Tommi Rumps, PA-C  ?  predniSONE (DELTASONE) 10 MG tablet Take 6 tablets  today, on day 2 take 5 tablets, day 3 take 4 tablets, day 4 take 3 tablets, day 5 take  2 tablets and 1 tablet the last day 02/14/22   Tommi Rumps, PA-C  ? ? ?Physical Exam: ?Vitals:  ? 02/24/22 2324 02/24/22 2349 02/24/22 2358 02/25/22 0041  ?BP: 130/74 (!) 107/57 (!) 107/57 104/60  ?Pulse: 61 73 73 69  ?Resp: 18 16 16 15   ?Temp: 98.2 ?F (36.8 ?C) 98.3 ?F (36.8 ?C) 98.3 ?F (36.8 ?C)   ?TempSrc: Oral Oral Oral   ?SpO2: 97% 100% 100% 100%  ?Weight:      ?Height:      ? ? ? ?Vitals:  ? 02/24/22 2324 02/24/22 2349 02/24/22 2358 02/25/22 0041  ?BP: 130/74 (!) 107/57 (!) 107/57 104/60  ?Pulse: 61 73 73 69  ?Resp: 18 16 16 15    ?Temp: 98.2 ?F (36.8 ?C) 98.3 ?F (36.8 ?C) 98.3 ?F (36.8 ?C)   ?TempSrc: Oral Oral Oral   ?SpO2: 97% 100% 100% 100%  ?Weight:      ?Height:      ?Constitutional: NAD, calm, comfortable ?Eyes: PERRL, lids and conjunctivae normal ?ENMT: Mucous membranes are moist. Posterior pharynx clear of any exudate or lesions. No redness of pharynx noted no exudate noted,Normal dentition.  ?Neck: normal, supple, no masses, no thyromegaly, tender  posterior neck muscles  but good rom,  not muscle spasm currently ?Respiratory: clear to auscultation bilaterally, no wheezing, no crackles. Normal respiratory effort. No accessory muscle use.  ?Cardiovascular: Regular rate and rhythm, no murmurs / rubs / gallops. No extremity edema. 2+ pedal pulses. No carotid bruits.  ?Abdomen: no tenderness, no masses palpated. No hepatosplenomegaly. Bowel sounds positive.  ?Musculoskeletal: no clubbing / cyanosis. No joint deformity upper and lower extremities. Good ROM, no contractures. Normal muscle tone.  ?Skin: no rashes, lesions, ulcers. No induration ?Neurologic: CN 2-12 grossly intact. Sensation intact, l. Strength 5/5 in all 4.  ?Psychiatric: Normal judgment and insight. Alert and oriented x 3. Normal mood.  ? ? ?Labs on Admission: I have personally reviewed following labs and imaging studies ? ?CBC: ?Recent Labs  ?Lab 02/24/22 ?1925 02/24/22 ?2054  ?WBC 19.1* 19.9*  ?HGB 6.7* 6.7*  ?HCT 24.2* 23.9*  ?MCV 78.6* 77.6*  ?PLT 525* 595*  ? ?Basic Metabolic Panel: ?Recent Labs  ?Lab 02/24/22 ?1925  ?NA 133*  ?K 3.9  ?CL 102  ?CO2 22  ?GLUCOSE 120*  ?BUN 13  ?CREATININE 0.75  ?CALCIUM 8.4*  ? ?GFR: ?Estimated Creatinine Clearance: 103.4 mL/min (by C-G formula based on SCr of 0.75 mg/dL). ?Liver Function Tests: ?Recent Labs  ?Lab 02/24/22 ?1925  ?AST 18  ?ALT 9  ?ALKPHOS 54  ?BILITOT 0.4  ?PROT 8.2*  ?ALBUMIN 3.6  ? ?Recent Labs  ?Lab 02/24/22 ?1925  ?LIPASE 31  ? ?No results for input(s): AMMONIA in the last 168 hours. ?Coagulation Profile: ?No  results for input(s): INR, PROTIME in the last 168 hours. ?Cardiac Enzymes: ?No results for input(s): CKTOTAL, CKMB, CKMBINDEX, TROPONINI in the last 168 hours. ?BNP (last 3 results) ?No results for input(s): PROBNP in the last 8760 hours. ?HbA1C: ?No results for input(s): HGBA1C in the last 72 hours. ?CBG: ?Recent Labs  ?Lab 02/24/22 ?1935  ?GLUCAP 94  ? ?Lipid Profile: ?No results for input(s): CHOL, HDL, LDLCALC, TRIG, CHOLHDL, LDLDIRECT in the last 72 hours. ?Thyroid Function Tests: ?No results for input(s): TSH, T4TOTAL, FREET4, T3FREE, THYROIDAB in the last 72 hours. ?  Anemia Panel: ?No results for input(s): VITAMINB12, FOLATE, FERRITIN, TIBC, IRON, RETICCTPCT in the last 72 hours. ?Urine analysis: ?   ?Component Value Date/Time  ? COLORURINE YELLOW (A) 12/14/2021 1245  ? APPEARANCEUR CLOUDY (A) 12/14/2021 1245  ? APPEARANCEUR Cloudy 03/03/2015 1140  ? LABSPEC 1.024 12/14/2021 1245  ? LABSPEC 1.029 03/03/2015 1140  ? PHURINE 6.0 12/14/2021 1245  ? GLUCOSEU NEGATIVE 12/14/2021 1245  ? GLUCOSEU Negative 03/03/2015 1140  ? HGBUR LARGE (A) 12/14/2021 1245  ? BILIRUBINUR NEGATIVE 12/14/2021 1245  ? BILIRUBINUR Negative 11/12/2018 1114  ? BILIRUBINUR Negative 03/03/2015 1140  ? KETONESUR NEGATIVE 12/14/2021 1245  ? PROTEINUR NEGATIVE 12/14/2021 1245  ? UROBILINOGEN 2.0 (H) 09/19/2020 1441  ? NITRITE NEGATIVE 12/14/2021 1245  ? LEUKOCYTESUR TRACE (A) 12/14/2021 1245  ? LEUKOCYTESUR 3+ 03/03/2015 1140  ? ? ?Radiological Exams on Admission: ?CT SOFT TISSUE NECK W CONTRAST ? ?Result Date: 02/24/2022 ?CLINICAL DATA:  Initial evaluation for acute neck pain. EXAM: CT NECK WITH CONTRAST TECHNIQUE: Multidetector CT imaging of the neck was performed using the standard protocol following the bolus administration of intravenous contrast. RADIATION DOSE REDUCTION: This exam was performed according to the departmental dose-optimization program which includes automated exposure control, adjustment of the mA and/or kV according to  patient size and/or use of iterative reconstruction technique. CONTRAST:  75mL OMNIPAQUE IOHEXOL 300 MG/ML  SOLN COMPARISON:  None. FINDINGS: Pharynx and larynx: Oral cavity within normal limits. No acute abn

## 2022-02-25 NOTE — ED Provider Notes (Signed)
12:15 AM  Assumed care at shift change.  Patient here with complaints of neck pain, dizziness, weakness. ? ?Patient found to have a hemoglobin of 6.7 which is new for her.  She did have a recent miscarriage per the previous provider but is no longer having vaginal bleeding and ultrasound did not show any retained products of conception.  Previous provider reports patient is guaiac negative.  Given she is symptomatic, she is receiving packed red blood cells here.  Hemodynamically stable. ? ? ?CT scans of the soft tissue of patient's neck show that she does have pharyngitis but no deep space neck infection, PTA.  Will send strep and COVID swabs. ? ? ?Patient has a leukocytosis of 19,000 here which is also new.  Previous provider ordered a CT of the abdomen pelvis to evaluate for infection versus collection of blood.  Patient will need admission. ? ? ?1:25 AM  Pt's CT scan reviewed by myself and radiologist and shows no significant abnormality other than ovarian cyst which was seen on ultrasound.  No bleeding or infection. ? ?COVID and strep negative.  Given leukocytosis with pharyngitis seen on CT neck without any other sign of infectious etiology, will give Unasyn for possible bacterial causes of pharyngitis.  She is phonating normally, swallowing secretions and her airway is patent. ? ? ?Consulted and discussed patient's case with hospitalist, Dr. Maisie Fus.  I have recommended admission and consulting physician agrees and will place admission orders.  Patient (and family if present) agree with this plan.  ? ?I reviewed all nursing notes, vitals, pertinent previous records.  All labs, EKGs, imaging ordered have been independently reviewed and interpreted by myself. ? ?  ?Bernese Doffing, Layla Maw, DO ?02/25/22 0128 ? ?

## 2022-02-25 NOTE — Progress Notes (Signed)
Pharmacy Antibiotic Note ? ?Jody Perez is a 31 y.o. female admitted on 02/24/2022 with  severe pharyngitis .  Pharmacy has been consulted for Unasyn dosing. ? ?Plan: ?Unasyn 3 gm IV X 1 given in ED on 3/31 @ 0127.  ?Unasyn 3 gm IV Q6H ordered to start on 4/1 @ 0730.  ? ?Height: 5\' 4"  (162.6 cm) ?Weight: 77.1 kg (170 lb) ?IBW/kg (Calculated) : 54.7 ? ?Temp (24hrs), Avg:98.1 ?F (36.7 ?C), Min:97.7 ?F (36.5 ?C), Max:98.3 ?F (36.8 ?C) ? ?Recent Labs  ?Lab 02/24/22 ?1925 02/24/22 ?2054  ?WBC 19.1* 19.9*  ?CREATININE 0.75  --   ?  ?Estimated Creatinine Clearance: 103.4 mL/min (by C-G formula based on SCr of 0.75 mg/dL).   ? ?No Known Allergies ? ?Antimicrobials this admission: ?  >>  ?  >>  ? ?Dose adjustments this admission: ? ? ?Microbiology results: ? BCx:  ? UCx:   ? Sputum:   ? MRSA PCR:  ? ?Thank you for allowing pharmacy to be a part of this patient?s care. ? ?Jody Perez D ?02/25/2022 3:01 AM ? ?

## 2022-02-25 NOTE — ED Notes (Signed)
Back from CT

## 2022-02-25 NOTE — Progress Notes (Signed)
Here ?Progress Note ? ? ?Patient: Jody Perez ZOX:[id number] DOB: 29-Sep-1991 DOA: 02/24/2022     0 ?DOS: the patient was seen and examined on 02/25/2022 ?  ? ?Assessment and Plan: ?Herniated disc, cervical ?Patient has a herniated disc causing cervical stenosis at C4-C5.  We will give IV Decadron while here and switch over to steroid taper upon disposition. ? ?Iron deficiency anemia, unspecified ?Hemoglobin 6.7 on presentation and up to 8.4 after transfusion.  Ferritin low at 6 going along with iron deficiency anemia.  We will give IV Venofer. ? ?Hemorrhagic ovarian cyst ?Hemorrhagic left ovarian cyst.  Recent miscarriage.  No vaginal bleeding.  Will need follow-up as outpatient.  Patient on empiric antibiotics. ? ?Leukocytosis ?Elevated white count likely secondary to recent steroid course. ? ? ? ? ?  ? ?Subjective: Patient having neck pain for couple weeks and she has been taking some muscle relaxants and had a course of steroids.  Also had some blurred vision and some headache.  Was admitted yesterday with severe anemia.  Had recent miscarriage and found to have a hemorrhagic ovarian cyst. ? ?Physical Exam: ?Vitals:  ? 02/25/22 0255 02/25/22 0421 02/25/22 0809 02/25/22 1259  ?BP: (!) 112/56 123/86 129/90 115/69  ?Pulse: 69 65 (!) 50 (!) 52  ?Resp: 16  16 14   ?Temp: 98.3 ?F (36.8 ?C) 98.1 ?F (36.7 ?C) 98 ?F (36.7 ?C) 98 ?F (36.7 ?C)  ?TempSrc: Oral Oral Axillary Oral  ?SpO2: 99% 100% 97% 95%  ?Weight:      ?Height:      ? ?Physical Exam ?HENT:  ?   Head: Normocephalic.  ?   Mouth/Throat:  ?   Pharynx: No oropharyngeal exudate.  ?Eyes:  ?   General: Lids are normal.  ?   Extraocular Movements: Extraocular movements intact.  ?   Conjunctiva/sclera: Conjunctivae normal.  ?   Pupils: Pupils are equal, round, and reactive to light.  ?   Comments: Some photophobia.  ?Cardiovascular:  ?   Rate and Rhythm: Normal rate and regular rhythm.  ?   Heart sounds: Normal heart sounds, S1 normal and S2 normal.  ?Pulmonary:  ?   Breath  sounds: No decreased breath sounds, wheezing, rhonchi or rales.  ?Abdominal:  ?   Palpations: Abdomen is soft.  ?   Tenderness: There is no abdominal tenderness.  ?Musculoskeletal:  ?   Cervical back: Neck supple. Edema present. Decreased range of motion.  ?   Right lower leg: No swelling.  ?   Left lower leg: No swelling.  ?Skin: ?   General: Skin is warm.  ?   Findings: No rash.  ?Neurological:  ?   Mental Status: She is alert and oriented to person, place, and time.  ?   Comments: Negative Kernig's and Brudzinski sign.  ?  ?Data Reviewed: ? ? ?Family Communication: Left message for patient's mother ? ?Disposition: ?Status is: Inpatient ?Remains inpatient appropriate because: With herniated disc, I will give IV steroids today and reevaluate tomorrow.  Case discussed with neurosurgery and they will follow-up as outpatient.   ? ?Disposition: Home. ? ?Author: ? , MD ?02/25/2022 1:45 PM ? ?For on call review [email or web address].  ?

## 2022-02-26 LAB — CBC WITH DIFFERENTIAL/PLATELET
Abs Immature Granulocytes: 0.44 10*3/uL — ABNORMAL HIGH (ref 0.00–0.07)
Basophils Absolute: 0.1 10*3/uL (ref 0.0–0.1)
Basophils Relative: 0 %
Eosinophils Absolute: 0 10*3/uL (ref 0.0–0.5)
Eosinophils Relative: 0 %
HCT: 27.7 % — ABNORMAL LOW (ref 36.0–46.0)
Hemoglobin: 8.3 g/dL — ABNORMAL LOW (ref 12.0–15.0)
Immature Granulocytes: 2 %
Lymphocytes Relative: 5 %
Lymphs Abs: 1.5 10*3/uL (ref 0.7–4.0)
MCH: 23.9 pg — ABNORMAL LOW (ref 26.0–34.0)
MCHC: 30 g/dL (ref 30.0–36.0)
MCV: 79.6 fL — ABNORMAL LOW (ref 80.0–100.0)
Monocytes Absolute: 0.3 10*3/uL (ref 0.1–1.0)
Monocytes Relative: 1 %
Neutro Abs: 26 10*3/uL — ABNORMAL HIGH (ref 1.7–7.7)
Neutrophils Relative %: 92 %
Platelets: 523 10*3/uL — ABNORMAL HIGH (ref 150–400)
RBC: 3.48 MIL/uL — ABNORMAL LOW (ref 3.87–5.11)
RDW: 18.4 % — ABNORMAL HIGH (ref 11.5–15.5)
WBC: 28.2 10*3/uL — ABNORMAL HIGH (ref 4.0–10.5)
nRBC: 0.1 % (ref 0.0–0.2)

## 2022-02-26 LAB — BASIC METABOLIC PANEL
Anion gap: 8 (ref 5–15)
BUN: 11 mg/dL (ref 6–20)
CO2: 25 mmol/L (ref 22–32)
Calcium: 8.6 mg/dL — ABNORMAL LOW (ref 8.9–10.3)
Chloride: 103 mmol/L (ref 98–111)
Creatinine, Ser: 0.74 mg/dL (ref 0.44–1.00)
GFR, Estimated: >60 mL/min (ref >60)
Glucose, Bld: 133 mg/dL — ABNORMAL HIGH (ref 70–99)
Potassium: 3.9 mmol/L (ref 3.5–5.1)
Sodium: 136 mmol/L (ref 135–145)

## 2022-02-26 LAB — TYPE AND SCREEN
ABO/RH(D): O POS
Antibody Screen: NEGATIVE
Unit division: 0

## 2022-02-26 LAB — BPAM RBC
Blood Product Expiration Date: 202305042359
ISSUE DATE / TIME: 202303312314
Unit Type and Rh: 5100

## 2022-02-26 MED ORDER — OXYCODONE-ACETAMINOPHEN 5-325 MG PO TABS: 1.0000 | ORAL_TABLET | Freq: Four times a day (QID) | ORAL | 0 refills | Status: DC | PRN

## 2022-02-26 MED ORDER — AMOXICILLIN-POT CLAVULANATE 875-125 MG PO TABS: 1.0000 | ORAL_TABLET | Freq: Two times a day (BID) | ORAL | 0 refills | Status: AC

## 2022-02-26 MED ORDER — PREDNISONE 10 MG PO TABS: ORAL_TABLET | ORAL | 0 refills | Status: DC

## 2022-02-26 MED ORDER — ACETAMINOPHEN 325 MG PO TABS: 650.0000 mg | ORAL_TABLET | Freq: Four times a day (QID) | ORAL | Status: DC | PRN

## 2022-02-26 NOTE — Progress Notes (Signed)
Pt wants to talk to Provider about changing her Dexamethasone. She does not like the way it makes her feel initially - hot, tingling and strange all over. ?

## 2022-02-26 NOTE — Discharge Summary (Signed)
?Physician Discharge Summary ?  ?Patient: Jody Perez MRN: [id number] DOB: 02/07/1991  ?Admit date:     02/24/2022  ?Discharge date: 02/26/22  ?Discharge Physician: Alford Highland  ? ?PCP: Erasmo Downer, MD  ? ?Recommendations at discharge:  ? ?Follow-up PCP 5 days ?Follow-up neurosurgery in a couple weeks ?Follow-up Dr. Druscilla Brownie ophthalmology this week ? ?Discharge Diagnoses: ?Active Problems: ?  Herniated disc, cervical ?  Iron deficiency anemia, unspecified ?  Hemorrhagic ovarian cyst ?  Leukocytosis ? ? ?Hospital Course: ?Patient was admitted to the hospital on 02/24/2022 and discharged on 02/26/2022.  Patient coming in with neck pain and dizziness and blurred vision going on for few weeks.  She has been treated with muscle relaxants and anti-inflammatories and steroids as outpatient.  In the ER she was found to be anemic with a hemoglobin of 6.7 and was given a unit of blood. ? ?I obtained an MRI of the brain which was negative.  A MRI of the cervical spine did show a herniated disc causing some C4-C5 moderate spinal canal stenosis with mild to moderate bilateral neural foraminal narrowing.  Case discussed with Dr. Ernestine Mcmurray neurosurgeon who recommended steroids and follow-up as outpatient.  I gave Decadron here in the hospital and switched over to prednisone taper upon discharge. ? ?For the patient's blurred vision her vision was 20/100 bilaterally.  No pain in the eyes but did have some photophobia.  With her presentation this is not a bacterial meningitis.  Case discussed with Dr. Druscilla Brownie and he will get her in this week for a evaluation for her eyes. ? ?For anemia we did give 1 unit of packed red blood cells for hemoglobin of 6.7.  With her ferritin being low I also gave IV iron.  Can consider oral iron as outpatient. ? ?Assessment and Plan: ?Herniated disc, cervical ?Patient has a herniated disc causing cervical stenosis at C4-C5.  We will give IV Decadron while here and switch over to prednisone  taper upon discharge. ? ?Iron deficiency anemia, unspecified ?Hemoglobin 6.7 on presentation and up to 8.4 after transfusion.  Ferritin low at 6 going along with iron deficiency anemia.  I gave IV Venofer yesterday on 02/25/2022.  Recommend checking CBC as outpatient. ? ?Hemorrhagic ovarian cyst ?Hemorrhagic left ovarian cyst.  Recent miscarriage. No vaginal bleeding.  Will need follow-up as outpatient.  Patient on empiric antibiotics.  Given Unasyn here and will switch over to Augmentin upon discharge.  Elevated white count likely secondary to steroids ? ?Leukocytosis ?Elevated white count likely secondary to recent steroid course and then steroids here in the hospital ? ? ? ? ?  ? ? ?Consultants: Phone consultations with ophthalmology and neurosurgery.  They will follow-up as outpatient. ?Procedures performed: None ?Disposition: Home ?Diet recommendation:  ?Regular diet ?DISCHARGE MEDICATION: ?Allergies as of 02/26/2022   ?No Known Allergies ?  ? ?  ?Medication List  ?  ? ?STOP taking these medications   ? ?cyclobenzaprine 10 MG tablet ?Commonly known as: FLEXERIL ?  ?meloxicam 15 MG tablet ?Commonly known as: MOBIC ?  ? ?  ? ?TAKE these medications   ? ?acetaminophen 325 MG tablet ?Commonly known as: TYLENOL ?Take 2 tablets (650 mg total) by mouth every 6 (six) hours as needed for mild pain (or Fever >/= 101). ?  ?amoxicillin-clavulanate 875-125 MG tablet ?Commonly known as: Augmentin ?Take 1 tablet by mouth 2 (two) times daily for 7 days. ?  ?ondansetron 4 MG disintegrating tablet ?Commonly known as: ZOFRAN-ODT ?Take 1 tablet (  4 mg total) by mouth every 8 (eight) hours as needed. ?  ?oxyCODONE-acetaminophen 5-325 MG tablet ?Commonly known as: Percocet ?Take 1 tablet by mouth every 6 (six) hours as needed for severe pain. ?  ?predniSONE 10 MG tablet ?Commonly known as: DELTASONE ?4 tabs po day1,2; 3 tabs po day3,4,5; 2 tabs po day6,7; 1 tab po day 8,9; 1/2 tab po day 10,11 ?Start taking on: February 27, 2022 ?What  changed: additional instructions ?  ? ?  ? ? Follow-up Information   ? ? Erasmo DownerBacigalupo, Angela M, MD Follow up in 5 day(s).   ?Specialty: Family Medicine ?Contact information: ?[address] ?[address] ?[place] [address] ?[phone] ? ? ?  ?  ? ? Loreen FreudSmith, Brandon Wayne, MD Follow up in 2 week(s).   ?Specialty: Neurosurgery ?Contact information: ?[address] ?[place] [address] ?[phone] ? ? ?  ?  ? ? Galen ManilaPorfilio, William, MD Follow up.   ?Specialty: Ophthalmology ?Why: call office on Monday.  Tell them this is a hospital follow up for blurry vision. ?Contact information: ?[address] ?[place] [address] ?[phone] ? ? ?  ?  ? ?  ?  ? ?  ? ?Discharge Exam: ?Filed Weights  ? 02/24/22 1853 02/26/22 0500  ?Weight: 77.1 kg 77.3 kg  ? ?Physical Exam ?HENT:  ?   Head: Normocephalic.  ?   Right Ear: Tympanic membrane normal.  ?   Left Ear: Tympanic membrane normal.  ?   Mouth/Throat:  ?   Pharynx: No oropharyngeal exudate.  ?Eyes:  ?   General: Lids are normal.  ?   Conjunctiva/sclera: Conjunctivae normal.  ?Cardiovascular:  ?   Rate and Rhythm: Normal rate and regular rhythm.  ?   Heart sounds: Normal heart sounds, S1 normal and S2 normal.  ?Pulmonary:  ?   Breath sounds: No decreased breath sounds, wheezing, rhonchi or rales.  ?Abdominal:  ?   Palpations: Abdomen is soft.  ?   Tenderness: There is no abdominal tenderness.  ?Musculoskeletal:  ?   Right lower leg: No swelling.  ?   Left lower leg: No swelling.  ?Neurological:  ?   Mental Status: She is alert and oriented to person, place, and time.  ?   Comments: Negative Kernig's and Brudzinski sign.  ?  ? ?Condition at discharge: stable ? ?The results of significant diagnostics from this hospitalization (including imaging, microbiology, ancillary and laboratory) are listed below for reference.  ? ?Imaging Studies: ?DG Cervical Spine 2-3 Views ? ?Result Date: 02/14/2022 ?CLINICAL DATA:  pain x 3 weeks without injury EXAM: CERVICAL SPINE - 2-3  VIEW COMPARISON:  None. FINDINGS: There is no radiographically evident cervical spine fracture. Alignment is normal. There is mild disc height loss at C2-C3, C3-C4, and C4-C5. Facets appear unremarkable. IMPRESSION: Mild disc height loss at C2-C3, C3-C4 and C4-C5. Otherwise negative cervical spine radiographs. Electronically Signed   By: Caprice RenshawJacob  Kahn M.D.   On: 02/14/2022 13:12  ? ?CT HEAD WO CONTRAST (5MM) ? ?Result Date: 02/21/2022 ?CLINICAL DATA:  Dizziness, persistent/recurrent. Neck and head pain for 10 days. EXAM: CT HEAD WITHOUT CONTRAST TECHNIQUE: Contiguous axial images were obtained from the base of the skull through the vertex without intravenous contrast. RADIATION DOSE REDUCTION: This exam was performed according to the departmental dose-optimization program which includes automated exposure control, adjustment of the mA and/or kV according to patient size and/or use of iterative reconstruction technique. COMPARISON:  CT head dated August 31, 2019 FINDINGS: Brain: No evidence of  acute infarction, hemorrhage, hydrocephalus, extra-axial collection or mass lesion/mass effect. Vascular: No hyperdense vessel or unexpected calcification. Skull: Normal. Negative for fracture or focal lesion. Sinuses/Orbits: No acute finding. Other: None. IMPRESSION: No acute intracranial abnormality. Electronically Signed   By: Larose Hires D.O.   On: 02/21/2022 09:10  ? ?CT SOFT TISSUE NECK W CONTRAST ? ?Result Date: 02/24/2022 ?CLINICAL DATA:  Initial evaluation for acute neck pain. EXAM: CT NECK WITH CONTRAST TECHNIQUE: Multidetector CT imaging of the neck was performed using the standard protocol following the bolus administration of intravenous contrast. RADIATION DOSE REDUCTION: This exam was performed according to the departmental dose-optimization program which includes automated exposure control, adjustment of the mA and/or kV according to patient size and/or use of iterative reconstruction technique. CONTRAST:  13mL  OMNIPAQUE IOHEXOL 300 MG/ML  SOLN COMPARISON:  None. FINDINGS: Pharynx and larynx: Oral cavity within normal limits. No acute abnormality about the dentition. Palatine tonsils within normal limits. Nasopharynx no

## 2022-02-26 NOTE — Consult Note (Signed)
Consulting Department: ED ? ?Primary Physician:  ?Erasmo Downer, MD ? ?Chief Complaint:  Herniated Disk ? ?History of Present Illness: ?02/26/2022 ?Jody Perez is a 31 y.o. female who presents with the chief complaint of neck pain.  She states that this happened a few weeks ago and has been present but varying in intensity.  She does not know any trauma.  Did not know any inciting factor.  She feels that her left side is worse than her right.  She has intermittent sometimes once a day symptoms going down her left hand that resolved spontaneously.  She does get some shooting down her arm however this is minor in comparison to her neck pain.  She feels that her neck is aggravated by movements, better with rest and recumbency.  She has not noticed any weakness or persistent numbness.  No bowel or bladder dysfunction no difficulty with walking.  No difficulty with fine motor movements.  She has had multiple evaluations and images.  Has not had physical therapy.  Not had injections.  Has had antispasmodics and nonsteroidals as well as steroids. ? ?Review of Systems:  ?A 10 point review of systems is negative, except for the pertinent positives and negatives detailed in the HPI. ? ?Past Medical History: ?Past Medical History:  ?Diagnosis Date  ? BV (bacterial vaginosis)   ? Chlamydia   ? Herpes genitalis in women   ? HSV infection   ? Migraines   ? Miscarriage   ? Trichomonas   ? UTI (lower urinary tract infection)   ? ? ?Past Surgical History: ?Past Surgical History:  ?Procedure Laterality Date  ? LIPOSUCTION  09/01/2021  ? WISDOM TOOTH EXTRACTION    ? ? ?Allergies: ?Allergies as of 02/24/2022  ? (No Known Allergies)  ? ? ?Medications: ? ?Current Facility-Administered Medications:  ?  acetaminophen (TYLENOL) tablet 650 mg, 650 mg, Oral, Q6H PRN **OR** acetaminophen (TYLENOL) suppository 650 mg, 650 mg, Rectal, Q6H PRN, Skip Mayer A, MD ?  albuterol (PROVENTIL) (2.5 MG/3ML) 0.083% nebulizer solution 2.5 mg,  2.5 mg, Nebulization, Q2H PRN, Skip Mayer A, MD ?  Ampicillin-Sulbactam (UNASYN) 3 g in sodium chloride 0.9 % 100 mL IVPB, 3 g, Intravenous, Q6H, Skip Mayer A, MD, Last Rate: 200 mL/hr at 02/26/22 0708, 3 g at 02/26/22 0708 ?  dexamethasone (DECADRON) injection 10 mg, 10 mg, Intravenous, Q8H, Wieting, Richard, MD, 10 mg at 02/25/22 2145 ?  HYDROcodone-acetaminophen (NORCO/VICODIN) 5-325 MG per tablet 1-2 tablet, 1-2 tablet, Oral, Q4H PRN, Lurline Del, MD, 2 tablet at 02/26/22 0700 ?  ondansetron (ZOFRAN) tablet 4 mg, 4 mg, Oral, Q6H PRN **OR** ondansetron (ZOFRAN) injection 4 mg, 4 mg, Intravenous, Q6H PRN, Lurline Del, MD, 4 mg at 02/25/22 2145 ? ? ?Social History: ?Social History  ? ?Tobacco Use  ? Smoking status: Former  ?  Types: E-cigarettes, Cigars  ?  Passive exposure: Never  ? Smokeless tobacco: Never  ?Vaping Use  ? Vaping Use: Former  ? Substances: Flavoring  ?Substance Use Topics  ? Alcohol use: Not Currently  ?  Alcohol/week: 4.0 standard drinks  ?  Types: 4 Shots of liquor per week  ?  Comment: last use 05/2021  ? Drug use: Not Currently  ?  Types: Marijuana  ?  Comment: last use age 58  ? ? ?Family Medical History: ?Family History  ?Problem Relation Age of Onset  ? Hypertension Mother   ? Cancer Mother   ?     Pitutary  ? Healthy  Brother   ? Hypertension Brother   ? Healthy Father   ? Breast cancer Maternal Grandmother   ? Healthy Brother   ? Colon cancer Neg Hx   ? Ovarian cancer Neg Hx   ? Cervical cancer Neg Hx   ? ? ?Physical Examination: ?Vitals:  ? 02/26/22 0449 02/26/22 0751  ?BP: (!) 105/59 127/80  ?Pulse: 74 100  ?Resp: 16 16  ?Temp: 98 ?F (36.7 ?C) 98.2 ?F (36.8 ?C)  ?SpO2: 98% 100%  ? ? ? ?General: Patient is well developed, well nourished, calm, collected, and in no apparent distress. ? ?NEUROLOGICAL:  ?General: In no acute distress.   ?Awake, alert, oriented to person, place, and time.  Pupils equal round and reactive to light.  Facial tone is symmetric.   Tongue protrusion is midline.  There is no pronator drift. ? ?Strength: ?Side Biceps Triceps Deltoid Interossei Grip Wrist Ext. Wrist Flex.  ?R 5 5 5 5 5 5 5   ?L 5 5 5 5 5 5 5   ? ?Side Iliopsoas Quads Hamstring PF DF EHL  ?R 5 5 5 5 5 5   ?L 5 5 5 5 5 5   ? ? ?Bilateral upper and lower extremity sensation is intact to light touch. ?Reflexes are 2+ and symmetric at the biceps, triceps, brachioradialis, patella and achilles. Hoffman's is absent. Clonus is not present.  Toes are down-going.   ? ?Imaging: ?Cervical MRI demonstrates a loss of cervical lordosis and reversal of lordosis and to cervical kyphosis.  Her worst level of spondylosis is at the C4-5 level demonstrating a disc herniation that abuts the spinal cord but no obvious T2 signal change. ? ?I have personally reviewed the images and agree with the above interpretation. ? ?Labs: ? ?  Latest Ref Rng & Units 02/26/2022  ?  5:07 AM 02/25/2022  ?  6:02 AM 02/24/2022  ?  8:54 PM  ?CBC  ?WBC 4.0 - 10.5 K/uL 28.2   23.9   19.9    ?Hemoglobin 12.0 - 15.0 g/dL 8.3   8.4   6.7    ?Hematocrit 36.0 - 46.0 % 27.7   28.8   23.9    ?Platelets 150 - 400 K/uL 523   549   595    ? ? ? ? ? ?Assessment and Plan: ?[name] is a pleasant 31 y.o. female with history of approximately 3 weeks of neck pain.  She states that this happened spontaneously in her sleep.  She does not remember any aggravating factor and specifically had no trauma.  She has noticed approximately 1 time a day brief episodes of numbness or radiating pain in the left upper extremity worse in her right upper extremity.  She has a nonfocal physical examination with no evidence of myelopathy or long tract signs.  MRI demonstrates a 4 5 disc herniation that abuts the cord.  This is likely the cause of her neck pain as well as her left upper extremity symptoms.  We will plan to have her work with therapy, be scheduled for injections, continue her steroid taper, and will have follow-up in clinic to evaluate for any  progression or resolution of her pain. ? ?04/28/2022, MD/MSCR ?Dept. of Neurosurgery ?  ? ?

## 2022-02-26 NOTE — Discharge Instructions (Addendum)
No driving until seen by Eye doctor ? ?Need repeat pelvic ultrasound as outpatient ?

## 2022-02-26 NOTE — Progress Notes (Signed)
Patient has been discharged home. Discharge papers given and explained to patient. She verbalized understanding.  Meds and f/u appointments reviewed. Rx sent by provider to the pharmacy.  Patient made aware.  ?

## 2022-02-26 NOTE — TOC CM/SW Note (Signed)
?  Transition of Care (TOC) Screening Note ? ? ?Patient Details  ?Name: Jody Perez ?Date of Birth: 1991/03/20 ? ? ?Transition of Care (TOC) CM/SW Contact:    ?Chrislynn Mosely E Wyndell Cardiff, LCSW ?Phone Number: ?02/26/2022, 8:45 AM ? ? ? ?Transition of Care Department [facility] [place]) has reviewed patient and no TOC needs have been identified at this time. We will continue to monitor patient advancement through interdisciplinary progression rounds. If new patient transition needs arise, please place a TOC consult. ? ? ?

## 2022-02-27 ENCOUNTER — Telehealth: Payer: Self-pay

## 2022-02-27 NOTE — Telephone Encounter (Signed)
Transition Care Management Follow-up Telephone Call ?Date of discharge and from where: TCM DC [facility] 02-26-22 Dx: herniated cervical disc ?How have you been since you were released from the hospital? Still feeling sore  ?Any questions or concerns? No ? ?Items Reviewed: ?Did the pt receive and understand the discharge instructions provided? Yes  ?Medications obtained and verified? Yes  ?Other? No  ?Any new allergies since your discharge? No  ?Dietary orders reviewed? Yes ?Do you have support at home? Yes  ? ?Home Care and Equipment/Supplies: ?Were home health services ordered? no ?If so, what is the name of the agency? na  ?Has the agency set up a time to come to the patient's home? not applicable ?Were any new equipment or medical supplies ordered?  No ?What is the name of the medical supply agency? na ?Were you able to get the supplies/equipment? not applicable ?Do you have any questions related to the use of the equipment or supplies? No ? ?Functional Questionnaire: (I = Independent and D = Dependent) ?ADLs: I ? ?Bathing/Dressing- I ? ?Meal Prep- I ? ?Eating- I ? ?Maintaining continence- I ? ?Transferring/Ambulation- I ? ?Managing Meds- I ? ?Follow up appointments reviewed: ? ?PCP Hospital f/u appt confirmed? Yes  Scheduled to see Dr Beryle Flock on 02-28-22 @ 930am. ?Specialist Hospital f/u appt confirmed? No  . ?Are transportation arrangements needed? No  ?If their condition worsens, is the pt aware to call PCP or go to the Emergency Dept.? Yes ?Was the patient provided with contact information for the PCP's office or ED? Yes ?Was to pt encouraged to call back with questions or concerns? Yes  ?

## 2022-02-28 ENCOUNTER — Other Ambulatory Visit: Payer: Self-pay | Admitting: Family Medicine

## 2022-02-28 ENCOUNTER — Ambulatory Visit: Payer: Medicaid Other | Admitting: Family Medicine

## 2022-02-28 ENCOUNTER — Encounter: Payer: Self-pay | Admitting: Family Medicine

## 2022-02-28 VITALS — BP 124/80 | HR 76 | Temp 98.4°F | Resp 16 | Wt 178.0 lb

## 2022-02-28 DIAGNOSIS — N83209 Unspecified ovarian cyst, unspecified side: Secondary | ICD-10-CM | POA: Diagnosis not present

## 2022-02-28 DIAGNOSIS — D72829 Elevated white blood cell count, unspecified: Secondary | ICD-10-CM | POA: Diagnosis not present

## 2022-02-28 DIAGNOSIS — D509 Iron deficiency anemia, unspecified: Secondary | ICD-10-CM

## 2022-02-28 DIAGNOSIS — M5020 Other cervical disc displacement, unspecified cervical region: Secondary | ICD-10-CM | POA: Diagnosis not present

## 2022-02-28 DIAGNOSIS — H538 Other visual disturbances: Secondary | ICD-10-CM

## 2022-02-28 DIAGNOSIS — O034 Incomplete spontaneous abortion without complication: Secondary | ICD-10-CM

## 2022-02-28 NOTE — Assessment & Plan Note (Signed)
Noted during hospitalization ?Her vision was 20-100 bilaterally ?This was not thought to be related to her neck pain and MRI brain was negative ?She was advised to follow-up with ophthalmology on discharge, but has been having difficulty getting an appointment ?Advised her that in her discharge summary it was noted that Dr. George Ina would see her within a week of discharge ?She will stop at [facility] eye today after leaving our office to talk to them about this and schedule an appointment ?

## 2022-02-28 NOTE — Patient Instructions (Signed)
For the patient's blurred vision her vision was 20/100 bilaterally.  No pain in the eyes but did have some photophobia.  With her presentation this is not a bacterial meningitis.  Case discussed with Dr. Druscilla Brownie and he will get her in this week for a evaluation for her eyes.  [facility]) ?

## 2022-02-28 NOTE — Assessment & Plan Note (Signed)
Elevated white count likely secondary to high-dose steroid use during hospitalization ?We will recheck today ?

## 2022-02-28 NOTE — Assessment & Plan Note (Signed)
Hemoglobin 6.7 on admission to hospitalization and up to 8.4 posttransfusion ?She is status post IV iron during hospitalization ?Recheck CBC today to ensure stability ?She is feeling much less symptomatic ?Encouraged her to take p.o. iron supplementation daily with stool softener for possible constipation ?

## 2022-02-28 NOTE — Assessment & Plan Note (Signed)
Noted during hospitalization ?She is asymptomatic with no abdominal pain and also has no vaginal bleeding ?She will finish her course of empiric antibiotics ?She did have a recent miscarriage which may be related ?Advised her to follow-up with her OB provider ?May need ultrasound follow-up ?

## 2022-02-28 NOTE — Progress Notes (Signed)
?  ? ?I,Sulibeya S Dimas,acting as a scribe for Shirlee LatchAngela Frans Valente, MD.,have documented all relevant documentation on the behalf of Shirlee LatchAngela Yenty Bloch, MD,as directed by  Shirlee LatchAngela [name], MD while in the presence of Shirlee LatchAngela Laisa Larrick, MD. ? ? ?Established patient visit ? ? ?Patient: Jody HeadlandKyata Perez   DOB: 10-19-1991   31 y.o. Female  MRN: [id number] ?Visit Date: 02/28/2022 ? ?Today's healthcare provider: Shirlee LatchAngela Rhegan Trunnell, MD  ? ?Chief Complaint  ?Patient presents with  ? Hospitalization Follow-up  ? ?Subjective  ?  ?HPI  ?Follow up Hospitalization ? ?Patient was admitted to [facility] on 02/24/22 and discharged on 02/26/22. ?She was treated for herniated disc, cervical, iron deficiency anemia, hemorrhagic ovarian cyst, leukocytosis. ? ?Treatment for this included: ?Herniated disc, cervical ?Patient has a herniated disc causing cervical stenosis at C4-C5.  We will give IV Decadron while here and switch over to prednisone taper upon discharge. ?Iron deficiency anemia, unspecified ?Hemoglobin 6.7 on presentation and up to 8.4 after transfusion.  Ferritin low at 6 going along with iron deficiency anemia.  I gave IV Venofer yesterday on 02/25/2022.  Recommend checking CBC as outpatient. ?Hemorrhagic ovarian cyst ?Hemorrhagic left ovarian cyst.  Recent miscarriage. No vaginal bleeding.  Will need follow-up as outpatient.  Patient on empiric antibiotics.  Given Unasyn here and will switch over to Augmentin upon discharge.  Elevated white count likely secondary to steroids ?Leukocytosis ?Elevated white count likely secondary to recent steroid course and then steroids here in the hospital ? ? ? ?Telephone follow up was done on 02/27/22 ?She reports excellent compliance with treatment. ?She reports this condition is stayed the same. ? ? ?Neck pain is improving with steroid. + L arm radiculopathy - somewhat improved. Has not set follow up with [facility] yet - will call ? ?Has not followed up with [facility] for  miscarriage. ?---------------------------------------------------------------------------------------- ? ? ?Medications: ?Outpatient Medications Prior to Visit  ?Medication Sig  ? acetaminophen (TYLENOL) 325 MG tablet Take 2 tablets (650 mg total) by mouth every 6 (six) hours as needed for mild pain (or Fever >/= 101).  ? amoxicillin-clavulanate (AUGMENTIN) 875-125 MG tablet Take 1 tablet by mouth 2 (two) times daily for 7 days.  ? ondansetron (ZOFRAN-ODT) 4 MG disintegrating tablet Take 1 tablet (4 mg total) by mouth every 8 (eight) hours as needed.  ? oxyCODONE-acetaminophen (PERCOCET) 5-325 MG tablet Take 1 tablet by mouth every 6 (six) hours as needed for severe pain.  ? predniSONE (DELTASONE) 10 MG tablet 4 tabs po day1,2; 3 tabs po day3,4,5; 2 tabs po day6,7; 1 tab po day 8,9; 1/2 tab po day 10,11  ? ?No facility-administered medications prior to visit.  ? ? ?Review of Systems  ?Constitutional:  Negative for appetite change, fatigue and fever.  ?Eyes:  Positive for photophobia and visual disturbance.  ?Respiratory:  Positive for shortness of breath. Negative for chest tightness.   ?Cardiovascular:  Negative for chest pain and palpitations.  ?Neurological:  Positive for dizziness and headaches.  ? ?Last CBC ?Lab Results  ?Component Value Date  ? WBC 28.2 (H) 02/26/2022  ? HGB 8.3 (L) 02/26/2022  ? HCT 27.7 (L) 02/26/2022  ? MCV 79.6 (L) 02/26/2022  ? MCH 23.9 (L) 02/26/2022  ? RDW 18.4 (H) 02/26/2022  ? PLT 523 (H) 02/26/2022  ? ?Last metabolic panel ?Lab Results  ?Component Value Date  ? GLUCOSE 133 (H) 02/26/2022  ? NA 136 02/26/2022  ? K 3.9 02/26/2022  ? CL 103 02/26/2022  ? CO2 25 02/26/2022  ?  BUN 11 02/26/2022  ? CREATININE 0.74 02/26/2022  ? GFRNONAA >60 02/26/2022  ? CALCIUM 8.6 (L) 02/26/2022  ? PROT 7.7 02/25/2022  ? ALBUMIN 3.6 02/25/2022  ? LABGLOB 3.2 08/04/2021  ? AGRATIO 1.3 08/04/2021  ? BILITOT 0.4 02/25/2022  ? ALKPHOS 60 02/25/2022  ? AST 17 02/25/2022  ? ALT 10 02/25/2022  ? ANIONGAP 8  02/26/2022  ? ?Last lipids ?Lab Results  ?Component Value Date  ? CHOL 157 06/06/2019  ? HDL 49 06/06/2019  ? LDLCALC 91 06/06/2019  ? TRIG 85 06/06/2019  ? CHOLHDL 3.2 06/06/2019  ? ?Last hemoglobin A1c ?Lab Results  ?Component Value Date  ? HGBA1C 5.1 08/04/2021  ? ?  ?  Objective  ?  ?BP 124/80 (BP Location: Right Arm, Patient Position: Sitting, Cuff Size: Large)   Pulse 76   Temp 98.4 ?F (36.9 ?C) (Temporal)   Resp 16   Wt 178 lb (80.7 kg)   BMI 30.55 kg/m?  ?BP Readings from Last 3 Encounters:  ?02/28/22 124/80  ?02/26/22 136/72  ?02/21/22 129/85  ? ?Wt Readings from Last 3 Encounters:  ?02/28/22 178 lb (80.7 kg)  ?02/26/22 170 lb 6.7 oz (77.3 kg)  ?02/21/22 169 lb 15.6 oz (77.1 kg)  ? ? Physical Exam ?Vitals reviewed.  ?Constitutional:   ?   General: She is not in acute distress. ?   Appearance: Normal appearance. She is well-developed. She is not diaphoretic.  ?HENT:  ?   Head: Normocephalic and atraumatic.  ?Eyes:  ?   General: No scleral icterus. ?   Conjunctiva/sclera: Conjunctivae normal.  ?Neck:  ?   Thyroid: No thyromegaly.  ?Cardiovascular:  ?   Rate and Rhythm: Normal rate and regular rhythm.  ?   Pulses: Normal pulses.  ?   Heart sounds: Normal heart sounds. No murmur heard. ?Pulmonary:  ?   Effort: Pulmonary effort is normal. No respiratory distress.  ?   Breath sounds: Normal breath sounds. No wheezing, rhonchi or rales.  ?Abdominal:  ?   General: There is no distension.  ?   Palpations: Abdomen is soft.  ?   Tenderness: There is no abdominal tenderness.  ?Musculoskeletal:  ?   Cervical back: Neck supple.  ?   Right lower leg: No edema.  ?   Left lower leg: No edema.  ?   Comments: Diffuse TTP of posterior neck.  ?Lymphadenopathy:  ?   Cervical: No cervical adenopathy.  ?Skin: ?   General: Skin is warm and dry.  ?   Findings: No rash.  ?Neurological:  ?   Mental Status: She is alert and oriented to person, place, and time. Mental status is at baseline.  ?Psychiatric:     ?   Mood and Affect:  Mood normal.     ?   Behavior: Behavior normal.  ?  ?Assessment and Plan ? ?Problem List Items Addressed This Visit   ? ?  ? Endocrine  ? Hemorrhagic ovarian cyst  ?  Noted during hospitalization ?She is asymptomatic with no abdominal pain and also has no vaginal bleeding ?She will finish her course of empiric antibiotics ?She did have a recent miscarriage which may be related ?Advised her to follow-up with her OB provider ?May need ultrasound follow-up ?  ?  ?  ? Musculoskeletal and Integument  ? Herniated disc, cervical  ?  C4-C5 herniated disc causing cervical stenosis with left arm radiculopathy, but no weakness ?She was treated with IV Decadron and she is finishing her  course of outpatient p.o. prednisone taper ?Advised her to call neurosurgery office to get follow-up with Dr. Ernestine Mcmurray as discussed during her hospitalization for ongoing management ?  ?  ?  ? Other  ? Incomplete miscarriage 01/01/22  ?  Patient with incomplete miscarriage on 01/01/2022 ?She was treated by the [facility] department for her pregnancy ?Her beta hCG had been downtrending, but had not reached 0 yet ?This has not been rechecked in the last month, so we will recheck today to ensure that it did reach 0 ?Also advised her to follow-up with her OB provider ?  ?  ? Relevant Orders  ? B-HCG Quant  ? Iron deficiency anemia, unspecified - Primary  ?  Hemoglobin 6.7 on admission to hospitalization and up to 8.4 posttransfusion ?She is status post IV iron during hospitalization ?Recheck CBC today to ensure stability ?She is feeling much less symptomatic ?Encouraged her to take p.o. iron supplementation daily with stool softener for possible constipation ?  ?  ? Relevant Orders  ? CBC with Differential/Platelet  ? Leukocytosis  ?  Elevated white count likely secondary to high-dose steroid use during hospitalization ?We will recheck today ?  ?  ? Relevant Orders  ? CBC with Differential/Platelet  ? Blurry vision  ?  Noted during  hospitalization ?Her vision was 20-100 bilaterally ?This was not thought to be related to her neck pain and MRI brain was negative ?She was advised to follow-up with ophthalmology on discharge, but has been having difficulty getting an appoi

## 2022-02-28 NOTE — Assessment & Plan Note (Signed)
Patient with incomplete miscarriage on 01/01/2022 ?She was treated by the [facility] for her pregnancy ?Her beta hCG had been downtrending, but had not reached 0 yet ?This has not been rechecked in the last month, so we will recheck today to ensure that it did reach 0 ?Also advised her to follow-up with her OB provider ?

## 2022-02-28 NOTE — Assessment & Plan Note (Signed)
C4-C5 herniated disc causing cervical stenosis with left arm radiculopathy, but no weakness ?She was treated with IV Decadron and she is finishing her course of outpatient p.o. prednisone taper ?Advised her to call neurosurgery office to get follow-up with Dr. Ernestine Mcmurray as discussed during her hospitalization for ongoing management ?

## 2022-03-01 LAB — BETA HCG QUANT (REF LAB): hCG Quant: 9 m[IU]/mL

## 2022-03-30 ENCOUNTER — Encounter: Payer: Self-pay | Admitting: Nurse Practitioner

## 2022-03-30 ENCOUNTER — Ambulatory Visit (LOCAL_COMMUNITY_HEALTH_CENTER): Payer: Medicaid Other | Admitting: Nurse Practitioner

## 2022-03-30 VITALS — BP 127/76 | Ht 64.0 in | Wt 178.0 lb

## 2022-03-30 DIAGNOSIS — Z309 Encounter for contraceptive management, unspecified: Secondary | ICD-10-CM | POA: Diagnosis not present

## 2022-03-30 DIAGNOSIS — Z3202 Encounter for pregnancy test, result negative: Secondary | ICD-10-CM | POA: Diagnosis not present

## 2022-03-30 DIAGNOSIS — Z113 Encounter for screening for infections with a predominantly sexual mode of transmission: Secondary | ICD-10-CM

## 2022-03-30 DIAGNOSIS — Z3042 Encounter for surveillance of injectable contraceptive: Secondary | ICD-10-CM

## 2022-03-30 DIAGNOSIS — Z3009 Encounter for other general counseling and advice on contraception: Secondary | ICD-10-CM

## 2022-03-30 LAB — WET PREP FOR TRICH, YEAST, CLUE
Trichomonas Exam: NEGATIVE
Yeast Exam: NEGATIVE

## 2022-03-30 LAB — PREGNANCY, URINE: Preg Test, Ur: NEGATIVE

## 2022-03-30 MED ORDER — MEDROXYPROGESTERONE ACETATE 150 MG/ML IM SUSP
150.0000 mg | INTRAMUSCULAR | Status: AC
  Administered 2022-03-30 – 2022-12-25 (×4): 150 mg via INTRAMUSCULAR

## 2022-03-30 NOTE — Progress Notes (Signed)
Pt here for Birth control.  Pt is interested in Depo. ?Depo 150 mg given IM in Rt Deltoid.  Pt given reminder card to return in 11-13 weeks for next Depo injection.  Berdie Ogren, RN ? ?

## 2022-03-30 NOTE — Progress Notes (Signed)
? ?[facility]  [facility] ? ?Subjective:  ?Jody Perez is a 31 y.o. being seen today  for PAP and STD screening.  Patient would also like to start birth control.   ? ?Chief Complaint  ?Patient presents with  ? Contraception  ?  Depo  ? ? ?HPI ? ? ?Does the patient have a current or past history of drug use? Yes  ? No components found for: HCV] ? ? ?Health Maintenance Due  ?Topic Date Due  ? COVID-19 Vaccine (1) Never done  ? Hepatitis C Screening  Never done  ? PAP SMEAR-Modifier  11/12/2021  ? ? ?Review of Systems  ?Constitutional:  Negative for chills, fever, malaise/fatigue and weight loss.  ?HENT:  Negative for congestion, hearing loss and sore throat.   ?Eyes:  Negative for blurred vision, double vision and photophobia.  ?Respiratory:  Negative for shortness of breath.   ?Cardiovascular:  Negative for chest pain.  ?Gastrointestinal:  Negative for abdominal pain, blood in stool, constipation, diarrhea, heartburn, nausea and vomiting.  ?Genitourinary:  Negative for dysuria and frequency.  ?Musculoskeletal:  Negative for back pain, joint pain and neck pain.  ?Skin:  Negative for itching and rash.  ?Neurological:  Negative for dizziness, weakness and headaches.  ?Endo/Heme/Allergies:  Does not bruise/bleed easily.  ?Psychiatric/Behavioral:  Negative for depression, substance abuse and suicidal ideas.   ? ?The following portions of the patient's history were reviewed and updated as appropriate: allergies, current medications, past family history, past medical history, past social history, past surgical history and problem list. Problem list updated. ? ? ?See flowsheet for other program required questions. ? ?Objective:  ? ?Vitals:  ? 03/30/22 1358  ?BP: 127/76  ?Weight: 178 lb (80.7 kg)  ?Height: 5\' 4"  (1.626 m)  ? ? ?Physical Exam ?Constitutional:   ?   Appearance: Normal appearance.  ?HENT:  ?   Head: Normocephalic.  ?   Right Ear: External ear normal.  ?    Left Ear: External ear normal.  ?   Nose: Nose normal.  ?   Mouth/Throat:  ?   Lips: Pink.  ?   Mouth: Mucous membranes are moist.  ?   Comments: No visible signs of dental caries ?Pulmonary:  ?   Effort: Pulmonary effort is normal.  ?Abdominal:  ?   General: Abdomen is flat.  ?   Palpations: Abdomen is soft.  ?Genitourinary: ?   Comments: External genitalia/pubic area without nits, lice, edema, erythema, lesions and inguinal adenopathy. ?Vagina with normal mucosa and discharge. ?Cervix without visible lesions. ?Uterus firm, mobile, nt, no masses, no CMT, no adnexal tenderness or fullness. pH 4.5. ? ?Musculoskeletal:  ?   Cervical back: Full passive range of motion without pain and normal range of motion.  ?Skin: ?   General: Skin is warm and dry.  ?Neurological:  ?   Mental Status: She is alert and oriented to person, place, and time.  ?Psychiatric:     ?   Attention and Perception: Attention normal.     ?   Speech: Speech normal.     ?   Behavior: Behavior is cooperative.  ? ? ? ? ?Assessment and Plan:  ?Jody Perez is a 31 y.o. female presenting to the [facility] Department for a Women's Health problem visit ? ?1. Family planning counseling ?-31 year old female in clinic today for PAP and STD screening.  Patient would also like to start birth control.  Patient desires  Depo.  ?-RP done 12/2021 ?-Patient had a miscarriage 11/2021, monitoring of beta HCG: ?-01/23/22= 89 ?-01/31/22= 41 ?-02/28/22= 9 ?-Will obtain pregnancy test, if negative today ok for patient to start Depo.   ?-PAP performed today ? ?- IGP, Aptima HPV ?- Pregnancy, urine ? ?2. Screening examination for venereal disease ?-STD screening today ?-Patient accepted all screenings including oral, vaginal CT/GC and declines bloodwork for HIV/RPR.  ?Patient meets criteria for HepB screening? Yes. Ordered? No - refused ?Patient meets criteria for HepC screening? Yes. Ordered? No - refused ? ?Treat wet prep per standing order ?Discussed time line for  [facility] results and that patient will be called with positive results and encouraged patient to call if she had not heard in 2 weeks.  ?Counseled to return or seek care for continued or worsening symptoms ?Recommended condom use with all sex ? ?Patient is currently not using  contraception   to prevent pregnancy.   ? ?- Chlamydia/Gonorrhea [facility] ?- WET PREP FOR TRICH, YEAST, CLUE ? ?3. Surveillance for Depo-Provera contraception ?-May have Depo 150 MG q11-13 wks x 1 year.  Advised patient to use condoms with all sex for 2 weeks.   ?- medroxyPROGESTERone (DEPO-PROVERA) injection 150 mg ? ? ? ? ?Return in about 11 weeks (around 06/15/2022) for Routine DMPA injection. ? ?Future Appointments  ?Date Time Provider Department Center  ?05/04/2022  9:20 AM Bacigalupo, Marzella Schlein, MD [facility]  ? ? ?Glenna Fellows, FNP ?

## 2022-04-06 ENCOUNTER — Telehealth: Payer: Self-pay | Admitting: Family Medicine

## 2022-04-06 ENCOUNTER — Other Ambulatory Visit: Payer: Self-pay

## 2022-04-06 LAB — IGP, APTIMA HPV
HPV Aptima: NEGATIVE
PAP Smear Comment: 0

## 2022-04-06 NOTE — Telephone Encounter (Signed)
Patient called and she says she doesn't see the provider who prescribed it anymore and that she takes it as needed, but not everyday and it's to prevent the flare and if she has a flare. ?

## 2022-04-06 NOTE — Telephone Encounter (Signed)
Lmtcb. [facility] please advise as below. 

## 2022-04-06 NOTE — Telephone Encounter (Signed)
Patient reports she uses medication as needed. And for genital herpes.  ?

## 2022-04-06 NOTE — Telephone Encounter (Signed)
[facility] faxed refill request for the following medications: ° °valACYclovir (VALTREX) 1000 MG tablet  ° °Please advise. ° °

## 2022-04-06 NOTE — Telephone Encounter (Signed)
I do not mind prescribing this for her now if she is no longer seeing the person that was prescribing it for her.  I do just need to know if she was taking this every day for prevention or taking it as needed for flares and also whether is for fever blisters versus genital herpes.  From review of her chart, I do think it is for genital herpes, but it does affect dosing. ?

## 2022-04-07 MED ORDER — VALACYCLOVIR HCL 1 G PO TABS: 1000.0000 mg | ORAL_TABLET | Freq: Every day | ORAL | 3 refills | Status: DC

## 2022-05-01 NOTE — Progress Notes (Deleted)
Complete physical exam   Patient: Jody Perez   DOB: [birth year]   31 y.o. Female  MRN: [id number] Visit Date: 05/04/2022  Today's healthcare provider: [name], MD   No chief complaint on file.  Subjective    Jody Perez is a 31 y.o. female who presents today for a complete physical exam.  She reports consuming a {diet types:17450} diet. {Exercise:19826} She generally feels {well/fairly well/poorly:18703}. She reports sleeping {well/fairly well/poorly:18703}. She {does/does not:200015} have additional problems to discuss today.  HPI    Past Medical History:  Diagnosis Date  . BV (bacterial vaginosis)   . Chlamydia   . Herpes genitalis in women   . HSV infection   . Migraines   . Miscarriage   . Trichomonas   . UTI (lower urinary tract infection)    Past Surgical History:  Procedure Laterality Date  . LIPOSUCTION  09/01/2021  . WISDOM TOOTH EXTRACTION     Social History   Socioeconomic History  . Marital status: Single    Spouse name: Not on file  . Number of children: 3  . Years of education: 92  . Highest education level: High school graduate  Occupational History  . Occupation: unemployed  Tobacco Use  . Smoking status: Former    Types: E-cigarettes, Cigars    Passive exposure: Never  . Smokeless tobacco: Never  Vaping Use  . Vaping Use: Former  . Substances: Flavoring  Substance and Sexual Activity  . Alcohol use: Not Currently    Alcohol/week: 4.0 standard drinks    Types: 4 Shots of liquor per week    Comment: last use 05/2021  . Drug use: Not Currently    Types: Marijuana    Comment: last use age 68  . Sexual activity: Not Currently    Partners: Male    Birth control/protection: Condom  Other Topics Concern  . Not on file  Social History Narrative  . Not on file   Social Determinants of Health   Financial Resource Strain: Not on file  Food Insecurity: Not on file  Transportation Needs: Not on file  Physical Activity: Not on  file  Stress: Not on file  Social Connections: Not on file  Intimate Partner Violence: Not At Risk  . Fear of Current or Ex-Partner: No  . Emotionally Abused: No  . Physically Abused: No  . Sexually Abused: No   Family Status  Relation Name Status  . Mother  Alive  . Brother  Alive  . Father  Alive  . MGM  Deceased  . Brother  Alive  . Neg Hx  (Not Specified)   Family History  Problem Relation Age of Onset  . Hypertension Mother   . Cancer Mother        Pitutary  . Healthy Brother   . Hypertension Brother   . Healthy Father   . Breast cancer Maternal Grandmother   . Healthy Brother   . Colon cancer Neg Hx   . Ovarian cancer Neg Hx   . Cervical cancer Neg Hx    No Known Allergies  Patient Care Team: Erasmo Downer, MD as PCP - General (Family Medicine)   Medications: Outpatient Medications Prior to Visit  Medication Sig  . acetaminophen (TYLENOL) 325 MG tablet Take 2 tablets (650 mg total) by mouth every 6 (six) hours as needed for mild pain (or Fever >/= 101). (Patient not taking: Reported on 03/30/2022)  . ondansetron (ZOFRAN-ODT) 4 MG disintegrating tablet Take 1 tablet (  4 mg total) by mouth every 8 (eight) hours as needed. (Patient not taking: Reported on 03/30/2022)  . oxyCODONE-acetaminophen (PERCOCET) 5-325 MG tablet Take 1 tablet by mouth every 6 (six) hours as needed for severe pain. (Patient not taking: Reported on 03/30/2022)  . predniSONE (DELTASONE) 10 MG tablet 4 tabs po day1,2; 3 tabs po day3,4,5; 2 tabs po day6,7; 1 tab po day 8,9; 1/2 tab po day 10,11 (Patient not taking: Reported on 03/30/2022)  . valACYclovir (VALTREX) 1000 MG tablet Take 1 tablet (1,000 mg total) by mouth daily. For 5 days For flare of HSV.   Facility-Administered Medications Prior to Visit  Medication Dose Route Frequency Provider  . medroxyPROGESTERone (DEPO-PROVERA) injection 150 mg  150 mg Intramuscular Q90 days Glenna Fellows, FNP    Review of Systems  Last CBC Lab Results   Component Value Date   WBC 28.2 (H) 02/26/2022   HGB 8.3 (L) 02/26/2022   HCT 27.7 (L) 02/26/2022   MCV 79.6 (L) 02/26/2022   MCH 23.9 (L) 02/26/2022   RDW 18.4 (H) 02/26/2022   PLT 523 (H) 02/26/2022   Last metabolic panel Lab Results  Component Value Date   GLUCOSE 133 (H) 02/26/2022   NA 136 02/26/2022   K 3.9 02/26/2022   CL 103 02/26/2022   CO2 25 02/26/2022   BUN 11 02/26/2022   CREATININE 0.74 02/26/2022   GFRNONAA >60 02/26/2022   CALCIUM 8.6 (L) 02/26/2022   PROT 7.7 02/25/2022   ALBUMIN 3.6 02/25/2022   LABGLOB 3.2 08/04/2021   AGRATIO 1.3 08/04/2021   BILITOT 0.4 02/25/2022   ALKPHOS 60 02/25/2022   AST 17 02/25/2022   ALT 10 02/25/2022   ANIONGAP 8 02/26/2022   Last lipids Lab Results  Component Value Date   CHOL 157 06/06/2019   HDL 49 06/06/2019   LDLCALC 91 06/06/2019   TRIG 85 06/06/2019   CHOLHDL 3.2 06/06/2019   Last hemoglobin A1c Lab Results  Component Value Date   HGBA1C 5.1 08/04/2021   Last thyroid functions Lab Results  Component Value Date   TSH 1.405 02/25/2022   T3TOTAL 107 04/29/2021   T4TOTAL 6.0 08/04/2021   Last vitamin D No results found for: 25OHVITD2, 25OHVITD3, VD25OH Last vitamin B12 and Folate No results found for: VITAMINB12, FOLATE    Objective    There were no vitals taken for this visit. BP Readings from Last 3 Encounters:  03/30/22 127/76  02/28/22 124/80  02/26/22 136/72   Wt Readings from Last 3 Encounters:  03/30/22 178 lb (80.7 kg)  02/28/22 178 lb (80.7 kg)  02/26/22 170 lb 6.7 oz (77.3 kg)       Physical Exam  ***  Last depression screening scores    01/23/2022    4:03 PM 08/04/2021   10:55 AM 04/29/2021    4:05 PM  PHQ 2/9 Scores  PHQ - 2 Score 0 0 0  PHQ- 9 Score  0 0   Last fall risk screening    08/04/2021   10:55 AM  Fall Risk   Falls in the past year? 0  Number falls in past yr: 0  Injury with Fall? 0  Risk for fall due to : No Fall Risks  Follow up Falls evaluation  completed   Last Audit-C alcohol use screening    08/04/2021   10:56 AM  Alcohol Use Disorder Test (AUDIT)  1. How often do you have a drink containing alcohol? 2  2. How many drinks containing alcohol do you  have on a typical day when you are drinking? 0  3. How often do you have six or more drinks on one occasion? 0  AUDIT-C Score 2   A score of 3 or more in women, and 4 or more in men indicates increased risk for alcohol abuse, EXCEPT if all of the points are from question 1   No results found for any visits on 05/04/22.  Assessment & Plan    Routine Health Maintenance and Physical Exam  Exercise Activities and Dietary recommendations  Goals   None     Immunization History  Administered Date(s) Administered  . HPV 9-valent 11/04/2015, 08/28/2018  . Tdap 07/30/2013, 09/27/2016, 09/18/2017    Health Maintenance  Topic Date Due  . COVID-19 Vaccine (1) Never done  . Hepatitis C Screening  Never done  . INFLUENZA VACCINE  06/27/2022  . PAP SMEAR-Modifier  03/31/2027  . TETANUS/TDAP  09/19/2027  . HIV Screening  Completed  . HPV VACCINES  Aged Out    Discussed health benefits of physical activity, and encouraged her to engage in regular exercise appropriate for her age and condition.  ***  No follow-ups on file.     {provider attestation***:1}   Shirlee LatchAngela Bacigalupo, MD  [facility] [phone] (phone) [phone] (fax)  [facility]

## 2022-05-04 ENCOUNTER — Encounter: Payer: Self-pay | Admitting: Family Medicine

## 2022-05-04 ENCOUNTER — Encounter: Payer: Medicaid Other | Admitting: Family Medicine

## 2022-05-12 ENCOUNTER — Ambulatory Visit: Payer: Medicaid Other | Admitting: Family Medicine

## 2022-05-12 ENCOUNTER — Encounter: Payer: Self-pay | Admitting: Family Medicine

## 2022-05-12 VITALS — BP 127/83 | HR 94 | Temp 98.4°F | Wt 179.0 lb

## 2022-05-12 DIAGNOSIS — L989 Disorder of the skin and subcutaneous tissue, unspecified: Secondary | ICD-10-CM | POA: Diagnosis not present

## 2022-05-12 NOTE — Progress Notes (Signed)
Established patient visit   Patient: Jody Perez   DOB: 1991-04-27   31 y.o. Female  MRN: [id number] Visit Date: 05/12/2022  Today's healthcare provider: [name], MD   No chief complaint on file.  Subjective    HPI   Patient is a 31 year old female who presents today requesting referral to dermatology for lesion on her nose.  She states it gets big and turn black.  She said it looks to her like it is a blood blot.  She popped it and nothing but blood comes out.  She states that after 2-3 days it fills back up with blood.  She states it has been doing this since about December.  Medications: Outpatient Medications Prior to Visit  Medication Sig   acetaminophen (TYLENOL) 325 MG tablet Take 2 tablets (650 mg total) by mouth every 6 (six) hours as needed for mild pain (or Fever >/= 101). (Patient not taking: Reported on 03/30/2022)   ondansetron (ZOFRAN-ODT) 4 MG disintegrating tablet Take 1 tablet (4 mg total) by mouth every 8 (eight) hours as needed. (Patient not taking: Reported on 03/30/2022)   oxyCODONE-acetaminophen (PERCOCET) 5-325 MG tablet Take 1 tablet by mouth every 6 (six) hours as needed for severe pain. (Patient not taking: Reported on 03/30/2022)   predniSONE (DELTASONE) 10 MG tablet 4 tabs po day1,2; 3 tabs po day3,4,5; 2 tabs po day6,7; 1 tab po day 8,9; 1/2 tab po day 10,11 (Patient not taking: Reported on 03/30/2022)   valACYclovir (VALTREX) 1000 MG tablet Take 1 tablet (1,000 mg total) by mouth daily. For 5 days For flare of HSV.   Facility-Administered Medications Prior to Visit  Medication Dose Route Frequency Provider   medroxyPROGESTERone (DEPO-PROVERA) injection 150 mg  150 mg Intramuscular Q90 days Glenna Fellows, FNP    Review of Systems  Skin:  Positive for color change. Negative for rash and wound.       Objective    BP 127/83 (BP Location: Left Arm, Patient Position: Sitting, Cuff Size: Normal)   Pulse 94   Temp 98.4 F (36.9 C) (Oral)   Wt 179  lb (81.2 kg)   SpO2 99%   BMI 30.73 kg/m    Physical Exam Vitals reviewed.  Constitutional:      General: She is not in acute distress.    Appearance: She is well-developed.  HENT:     Head: Normocephalic and atraumatic.  Eyes:     General: No scleral icterus.    Conjunctiva/sclera: Conjunctivae normal.  Cardiovascular:     Rate and Rhythm: Normal rate and regular rhythm.  Pulmonary:     Effort: Pulmonary effort is normal. No respiratory distress.  Skin:    General: Skin is warm and dry.     Comments: Small scar of tip of nose (was popped recently)  Neurological:     Mental Status: She is alert and oriented to person, place, and time.  Psychiatric:        Behavior: Behavior normal.       No results found for any visits on 05/12/22.  Assessment & Plan     1. Non-healing skin lesion of nose - recurrent and non-healing blistering of the tip of the nose - will likely need biopsy - referral to Derm for further eval - advised patient to take pictures of it in all forms and bring to that appt. - Ambulatory referral to Dermatology   No follow-ups on file.      I,  [name], MD, have reviewed all documentation for this visit. The documentation on 05/12/22 for the exam, diagnosis, procedures, and orders are all accurate and complete.   Anay Walter, Marzella Schlein, MD, MPH [facility]

## 2022-06-13 ENCOUNTER — Ambulatory Visit
Admission: EM | Admit: 2022-06-13 | Discharge: 2022-06-13 | Disposition: A | Discharge status: Home / Self Care | Payer: Medicaid Other | Attending: Physician Assistant | Admitting: Physician Assistant

## 2022-06-13 ENCOUNTER — Encounter: Payer: Self-pay | Admitting: Emergency Medicine

## 2022-06-13 DIAGNOSIS — N760 Acute vaginitis: Secondary | ICD-10-CM

## 2022-06-13 DIAGNOSIS — B9689 Other specified bacterial agents as the cause of diseases classified elsewhere: Secondary | ICD-10-CM | POA: Diagnosis not present

## 2022-06-13 DIAGNOSIS — N898 Other specified noninflammatory disorders of vagina: Secondary | ICD-10-CM | POA: Diagnosis not present

## 2022-06-13 DIAGNOSIS — B379 Candidiasis, unspecified: Secondary | ICD-10-CM

## 2022-06-13 DIAGNOSIS — T3695XA Adverse effect of unspecified systemic antibiotic, initial encounter: Secondary | ICD-10-CM | POA: Diagnosis not present

## 2022-06-13 LAB — WET PREP, GENITAL
Sperm: NONE SEEN
Trich, Wet Prep: NONE SEEN
WBC, Wet Prep HPF POC: <10 — AB (ref <10)
Yeast Wet Prep HPF POC: NONE SEEN

## 2022-06-13 MED ORDER — FLUCONAZOLE 150 MG PO TABS: ORAL_TABLET | ORAL | 0 refills | Status: DC

## 2022-06-13 MED ORDER — METRONIDAZOLE 500 MG PO TABS: 500.0000 mg | ORAL_TABLET | Freq: Two times a day (BID) | ORAL | 0 refills | Status: AC

## 2022-06-13 NOTE — ED Provider Notes (Signed)
[facility]    CSN: [id number] Arrival date & time: 06/13/22  1139      History   Chief Complaint Chief Complaint  Patient presents with   Vaginal Itching    Vaginal Discharge    HPI Jody Perez is a 31 y.o. female presenting for slightly malodorous whitish vaginal discharge for the past few days.  Patient also reports vaginal itching.  She is toward the end of her menstrual cycle.  She reports that she had a tampon in 2 days ago and she is not sure if she took it out as she has been drinking.  She has not put another 1 in because she was afraid there could already be a tampon in there.  She has a very long nails and did not want to potentially scratch the inside trying to get something out.  She denies any dysuria, frequency, pelvic pain or flank pain.  Concern for BV or yeast infection.  HPI  Past Medical History:  Diagnosis Date   BV (bacterial vaginosis)    Chlamydia    Herpes genitalis in women    HSV infection    Migraines    Miscarriage    Trichomonas    UTI (lower urinary tract infection)     Patient Active Problem List   Diagnosis Date Noted   Blurry vision 02/28/2022   Herniated disc, cervical 02/25/2022   Iron deficiency anemia, unspecified 02/25/2022   Hemorrhagic ovarian cyst 02/25/2022   Leukocytosis 02/25/2022   Incomplete miscarriage 01/01/22 01/24/2022   Trichomonas infection12/17/19 01/23/2022   Liposuction 08/2021 01/23/2022   Migraine without aura and without status migrainosus, not intractable 01/11/2021   Vapes nicotine containing substance 07/30/2020   Obesity BMI=29.9 04/07/2020   Recurrent vaginitis 06/06/2019   History of genital HSV 01/28/2013    Past Surgical History:  Procedure Laterality Date   LIPOSUCTION  09/01/2021   WISDOM TOOTH EXTRACTION      OB History     Gravida  6   Para  3   Term  3   Preterm  0   AB  3   Living  3      SAB  3   IAB  0   Ectopic  0   Multiple  0   Live Births  3             Home Medications    Prior to Admission medications   Medication Sig Start Date End Date Taking? Authorizing Provider  fluconazole (DIFLUCAN) 150 MG tablet Take 1 tab p.o. every 72 hours for yeast infection 06/13/22  Yes Eusebio Friendly B, PA-C  metroNIDAZOLE (FLAGYL) 500 MG tablet Take 1 tablet (500 mg total) by mouth 2 (two) times daily for 7 days. 06/13/22 06/20/22 Yes [name], PA-C  acetaminophen (TYLENOL) 325 MG tablet Take 2 tablets (650 mg total) by mouth every 6 (six) hours as needed for mild pain (or Fever >/= 101). Patient not taking: Reported on 03/30/2022 02/26/22   Alford Highland, MD  ondansetron (ZOFRAN-ODT) 4 MG disintegrating tablet Take 1 tablet (4 mg total) by mouth every 8 (eight) hours as needed. Patient not taking: Reported on 03/30/2022 02/21/22   Faythe Ghee, PA-C  oxyCODONE-acetaminophen (PERCOCET) 5-325 MG tablet Take 1 tablet by mouth every 6 (six) hours as needed for severe pain. Patient not taking: Reported on 03/30/2022 02/26/22 02/26/23  Alford Highland, MD  predniSONE (DELTASONE) 10 MG tablet 4 tabs po day1,2; 3 tabs po day3,4,5; 2 tabs  po day6,7; 1 tab po day 8,9; 1/2 tab po day 10,11 Patient not taking: Reported on 03/30/2022 02/27/22   Alford Highland, MD  valACYclovir (VALTREX) 1000 MG tablet Take 1 tablet (1,000 mg total) by mouth daily. For 5 days For flare of HSV. 04/07/22   Bacigalupo, Marzella Schlein, MD    Family History Family History  Problem Relation Age of Onset   Hypertension Mother    Cancer Mother        Pitutary   Healthy Brother    Hypertension Brother    Healthy Father    Breast cancer Maternal Grandmother    Healthy Brother    Colon cancer Neg Hx    Ovarian cancer Neg Hx    Cervical cancer Neg Hx     Social History Social History   Tobacco Use   Smoking status: Former    Types: E-cigarettes, Cigars    Passive exposure: Never   Smokeless tobacco: Never  Vaping Use   Vaping Use: Every day   Substances: Flavoring  Substance  Use Topics   Alcohol use: Yes    Alcohol/week: 4.0 standard drinks of alcohol    Types: 4 Shots of liquor per week    Comment: last use 05/2021   Drug use: Not Currently    Types: Marijuana    Comment: last use age 45     Allergies   Patient has no known allergies.   Review of Systems Review of Systems  Constitutional:  Negative for fatigue and fever.  Gastrointestinal:  Negative for abdominal pain.  Genitourinary:  Positive for vaginal discharge. Negative for dysuria, flank pain, frequency, hematuria, urgency, vaginal bleeding and vaginal pain.  Musculoskeletal:  Negative for back pain.  Skin:  Negative for rash.     Physical Exam Triage Vital Signs ED Triage Vitals  Enc Vitals Group     BP 06/13/22 1245 120/67     Pulse Rate 06/13/22 1245 87     Resp 06/13/22 1245 16     Temp 06/13/22 1245 98.5 F (36.9 C)     Temp Source 06/13/22 1245 Oral     SpO2 06/13/22 1245 100 %     Weight --      Height --      Head Circumference --      Peak Flow --      Pain Score 06/13/22 1244 0     Pain Loc --      Pain Edu? --      Excl. in GC? --    No data found.  Updated Vital Signs BP 120/67 (BP Location: Left Arm)   Pulse 87   Temp 98.5 F (36.9 C) (Oral)   Resp 16   SpO2 100%      Physical Exam Vitals and nursing note reviewed.  Constitutional:      General: She is not in acute distress.    Appearance: Normal appearance. She is not ill-appearing or toxic-appearing.  HENT:     Head: Normocephalic and atraumatic.  Eyes:     General: No scleral icterus.       Right eye: No discharge.        Left eye: No discharge.     Conjunctiva/sclera: Conjunctivae normal.  Cardiovascular:     Rate and Rhythm: Normal rate and regular rhythm.     Heart sounds: Normal heart sounds.  Pulmonary:     Effort: Pulmonary effort is normal. No respiratory distress.     Breath sounds: Normal breath  sounds.  Abdominal:     Palpations: Abdomen is soft.     Tenderness: There is no  abdominal tenderness.  Genitourinary:    General: Normal vulva.     Exam position: Lithotomy position.     Vagina: Vaginal discharge and bleeding (brownish discharge noted) present.     Comments: No foreign bodies noted Musculoskeletal:     Cervical back: Neck supple.  Skin:    General: Skin is dry.  Neurological:     General: No focal deficit present.     Mental Status: She is alert. Mental status is at baseline.     Motor: No weakness.     Gait: Gait normal.  Psychiatric:        Mood and Affect: Mood normal.        Behavior: Behavior normal.        Thought Content: Thought content normal.      UC Treatments / Results  Labs (all labs ordered are listed, but only abnormal results are displayed) Labs Reviewed  WET PREP, GENITAL - Abnormal; Notable for the following components:      Result Value   Clue Cells Wet Prep HPF POC PRESENT (*)    WBC, Wet Prep HPF POC <10 (*)    All other components within normal limits    EKG   Radiology No results found.  Procedures Procedures (including critical care time)  Medications Ordered in UC Medications - No data to display  Initial Impression / Assessment and Plan / UC Course  I have reviewed the triage vital signs and the nursing notes.  Pertinent labs & imaging results that were available during my care of the patient were reviewed by me and considered in my medical decision making (see chart for details).  31 year old female presenting for vaginal discharge for the past couple days.  Patient also concerned about potentially having a tampon still inserted.  On exam she did not have any foreign bodies/tampons noted.  She did have thick brownish discharge.  No pain with examination.  Wet prep obtained.  Positive clue cells.  Will treat for BV.  Patient reports antibiotic induced yeast infections I have also sent Diflucan.  Follow-up as needed.   Final Clinical Impressions(s) / UC Diagnoses   Final diagnoses:  BV  (bacterial vaginosis)  Vaginal discharge  Antibiotic-induced yeast infection   Discharge Instructions   None    ED Prescriptions     Medication Sig Dispense Auth. Provider   metroNIDAZOLE (FLAGYL) 500 MG tablet Take 1 tablet (500 mg total) by mouth 2 (two) times daily for 7 days. 14 tablet Eusebio Friendly B, PA-C   fluconazole (DIFLUCAN) 150 MG tablet Take 1 tab p.o. every 72 hours for yeast infection 2 tablet [name], PA-C      PDMP not reviewed this encounter.   [name], PA-C 06/13/22 1354

## 2022-06-13 NOTE — ED Triage Notes (Signed)
Pt presents with vaginal discharge and itching x 2 days. Pt states she can not remember if she removed her tampon.

## 2022-06-15 ENCOUNTER — Ambulatory Visit (LOCAL_COMMUNITY_HEALTH_CENTER): Payer: Medicaid Other

## 2022-06-15 VITALS — BP 136/80 | Ht 64.0 in | Wt 181.0 lb

## 2022-06-15 DIAGNOSIS — Z309 Encounter for contraceptive management, unspecified: Secondary | ICD-10-CM

## 2022-06-15 DIAGNOSIS — Z30013 Encounter for initial prescription of injectable contraceptive: Secondary | ICD-10-CM | POA: Diagnosis not present

## 2022-06-15 DIAGNOSIS — Z3009 Encounter for other general counseling and advice on contraception: Secondary | ICD-10-CM

## 2022-06-15 NOTE — Progress Notes (Signed)
11weeks 0days post depo.Voices no concerns. Depo given today per order by A.White,FNP dated 03/30/22. Tolerated well R delt. Next depo due 08/31/22. Reminder card given.

## 2022-07-21 ENCOUNTER — Telehealth: Payer: Medicaid Other | Admitting: Physician Assistant

## 2022-07-21 ENCOUNTER — Ambulatory Visit: Payer: Self-pay | Admitting: *Deleted

## 2022-07-21 DIAGNOSIS — A6004 Herpesviral vulvovaginitis: Secondary | ICD-10-CM

## 2022-07-21 MED ORDER — VALACYCLOVIR HCL 1 G PO TABS: 1000.0000 mg | ORAL_TABLET | Freq: Every day | ORAL | 0 refills | Status: DC

## 2022-07-21 NOTE — Progress Notes (Signed)
Virtual Visit Consent   Jody Perez, you are scheduled for a virtual visit with a [facility] provider today. Just as with appointments in the office, your consent must be obtained to participate. Your consent will be active for this visit and any virtual visit you may have with one of our providers in the next 365 days. If you have a MyChart account, a copy of this consent can be sent to you electronically.  As this is a virtual visit, video technology does not allow for your provider to perform a traditional examination. This may limit your provider's ability to fully assess your condition. If your provider identifies any concerns that need to be evaluated in person or the need to arrange testing (such as labs, EKG, etc.), we will make arrangements to do so. Although advances in technology are sophisticated, we cannot ensure that it will always work on either your end or our end. If the connection with a video visit is poor, the visit may have to be switched to a telephone visit. With either a video or telephone visit, we are not always able to ensure that we have a secure connection.  By engaging in this virtual visit, you consent to the provision of healthcare and authorize for your insurance to be billed (if applicable) for the services provided during this visit. Depending on your insurance coverage, you may receive a charge related to this service.  I need to obtain your verbal consent now. Are you willing to proceed with your visit today? Jody Perez has provided verbal consent on 07/21/2022 for a virtual visit (video or telephone). Margaretann Loveless, PA-C  Date: 07/21/2022 10:20 AM  Virtual Visit via Video Note   IMargaretann Loveless, connected with  Jody Perez  ([id number], Sep 10, 1991) on 07/21/22 at 10:15 AM EDT by a video-enabled telemedicine application and verified that I am speaking with the correct person using two identifiers.  Location: Patient: Virtual Visit Location  Patient: Home Provider: Virtual Visit Location Provider: Home Office   I discussed the limitations of evaluation and management by telemedicine and the availability of in person appointments. The patient expressed understanding and agreed to proceed.    History of Present Illness: Jody Perez is a 31 y.o. who identifies as a female who was assigned female at birth, and is being seen today for medication refills. She reports she has genital herpes and uses Valtrex for outbreaks, no preventative treatment at this time. She is currently having an outbreak and is requesting a refill.    Problems:  Patient Active Problem List   Diagnosis Date Noted   Blurry vision 02/28/2022   Herniated disc, cervical 02/25/2022   Iron deficiency anemia, unspecified 02/25/2022   Hemorrhagic ovarian cyst 02/25/2022   Leukocytosis 02/25/2022   Incomplete miscarriage 01/01/22 01/24/2022   Trichomonas infection12/17/19 01/23/2022   Liposuction 08/2021 01/23/2022   Migraine without aura and without status migrainosus, not intractable 01/11/2021   Vapes nicotine containing substance 07/30/2020   Obesity BMI=29.9 04/07/2020   Recurrent vaginitis 06/06/2019   History of genital HSV 01/28/2013    Allergies: No Known Allergies Medications:  Current Outpatient Medications:    valACYclovir (VALTREX) 1000 MG tablet, Take 1 tablet (1,000 mg total) by mouth daily. For 5 days For flare of HSV., Disp: 30 tablet, Rfl: 0  Current Facility-Administered Medications:    medroxyPROGESTERone (DEPO-PROVERA) injection 150 mg, 150 mg, Intramuscular, Q90 days, White, Ayo, FNP, 150 mg at 06/15/22 1545  Observations/Objective: Patient is well-developed, well-nourished in  no acute distress.  Resting comfortably at home.  Head is normocephalic, atraumatic.  No labored breathing.  Speech is clear and coherent with logical content.  Patient is alert and oriented at baseline.    Assessment and Plan: 1. Herpes simplex  vulvovaginitis - valACYclovir (VALTREX) 1000 MG tablet; Take 1 tablet (1,000 mg total) by mouth daily. For 5 days For flare of HSV.  Dispense: 30 tablet; Refill: 0  - Valtrex refilled - Follow up with PCP for future long term management  Follow Up Instructions: I discussed the assessment and treatment plan with the patient. The patient was provided an opportunity to ask questions and all were answered. The patient agreed with the plan and demonstrated an understanding of the instructions.  A copy of instructions were sent to the patient via MyChart unless otherwise noted below.    The patient was advised to call back or seek an in-person evaluation if the symptoms worsen or if the condition fails to improve as anticipated.  Time:  I spent 8 minutes with the patient via telehealth technology discussing the above problems/concerns.    Margaretann Loveless, PA-C

## 2022-07-21 NOTE — Patient Instructions (Signed)
Jody Perez, thank you for joining Margaretann Loveless, PA-C for today's virtual visit.  While this provider is not your primary care provider (PCP), if your PCP is located in our provider database this encounter information will be shared with them immediately following your visit.  Consent: (Patient) Jody Perez provided verbal consent for this virtual visit at the beginning of the encounter.  Current Medications:  Current Outpatient Medications:    valACYclovir (VALTREX) 1000 MG tablet, Take 1 tablet (1,000 mg total) by mouth daily. For 5 days For flare of HSV., Disp: 30 tablet, Rfl: 0  Current Facility-Administered Medications:    medroxyPROGESTERone (DEPO-PROVERA) injection 150 mg, 150 mg, Intramuscular, Q90 days, White, Ayo, FNP, 150 mg at 06/15/22 1545   Medications ordered in this encounter:  Meds ordered this encounter  Medications   valACYclovir (VALTREX) 1000 MG tablet    Sig: Take 1 tablet (1,000 mg total) by mouth daily. For 5 days For flare of HSV.    Dispense:  30 tablet    Refill:  0    Order Specific Question:   Supervising Provider    Answer:   Hyacinth Meeker, BRIAN [[id number]]     *If you need refills on other medications prior to your next appointment, please contact your pharmacy*  Follow-Up: Call back or seek an in-person evaluation if the symptoms worsen or if the condition fails to improve as anticipated.  Other Instructions Genital Herpes Genital herpes is a common sexually transmitted infection (STI) that is caused by a virus. The virus spreads from person to person through contact with a sore, infected saliva, or infected skin. The virus can cause itching, blisters, and sores around the genitals or rectum. During an outbreak of infection, symptoms may last for several days and then go away. However, the virus remains in the body, so more outbreaks may happen in the future. The time between outbreaks varies and can be from months to years. Genital herpes can affect  anyone. It is particularly concerning for pregnant women because the virus can be passed to the baby during delivery. Genital herpes is also a concern for people who have a weak disease-fighting system (immune system). What are the causes? This condition is caused by the herpes simplex virus, type 1 or type 2 (HSV-1 or HSV-2). The virus may spread through: Sexual contact with an infected person, including vaginal, anal, and oral sex. Contact with a herpes sore. The skin. This means that you can get herpes from an infected partner even if there are no blisters or sores present. Your partner may not know that he or she is infected. What increases the risk? You are more likely to develop this condition if: You have sex with many partners. You do not use latex or polyurethane condoms during sex. What are the signs or symptoms? Most people do not have symptoms or they have mild symptoms that may be mistaken for other skin problems. Symptoms may include: Small, red bumps near the genitals, rectum, or mouth. These bumps turn into blisters and then sores. Flu-like (influenza-like) symptoms, including: Fever. Body aches. Swollen lymph nodes. Headache. Painful urination. Pain and itching in the genital area or rectal area. Vaginal discharge. Tingling or shooting pain in the legs and buttocks. Generally, symptoms are more severe and last longer during the first (primary) outbreak. Influenza-like symptoms are also more common during the primary outbreak. How is this diagnosed? This condition may be diagnosed based on: A physical exam. Your medical history. Blood tests. A test  of a fluid sample (culture) from an open sore. How is this treated? There is no cure for this condition, but treatment with antiviral medicines can do the following: Speed up healing and relieve symptoms. Help to reduce the spread of the virus to sexual partners. Limit the chance of future outbreaks, or make future  outbreaks shorter. Lessen symptoms of future outbreaks. Your health care provider may also recommend over-the-counter medicines to help with pain and itching. Follow these instructions at home: If you have an outbreak:  Keep the affected areas dry and clean. Avoid rubbing or touching blisters and sores. If you do touch blisters or sores: Wash your hands thoroughly with soap and water for at least 20 seconds. If soap and water are not available, use an alcohol-based hand sanitizer. Do not touch your eyes afterward. Sexual activity Do not have sexual contact during active outbreaks. Practice safe sex. Herpes can spread even if your partner does not have blisters or sores. Latex or polyurethane condoms and female condoms may help prevent the spread of the herpes virus. Managing pain and discomfort If directed, put ice on the painful area. To do this: Put ice in a plastic bag. Place a towel between your skin and the bag. Leave the ice on for 20 minutes, 2-3 times a day. Remove the ice if your skin turns bright red. This is very important. If you cannot feel pain, heat, or cold, you have a greater risk of damage to the area. If told, take a cool sitz bath to help relieve pain or itching. A sitz bath is a water bath that you take while sitting down in water that is deep enough to cover your hips and buttocks. General instructions Take over-the-counter and prescription medicines only as told by your health care provider. If you were prescribed an antiviral medicine, use it as told by your health care provider. Do not stop using the antiviral even if you start to feel better. Keep all follow-up visits. This is important. How is this prevented? Use condoms. Although you can get genital herpes during sexual contact even with the use of a condom, a condom can provide some protection. Avoid having multiple sexual partners. Talk with your sexual partner about any symptoms either of you may have. Also,  talk with your partner about any history of STIs. Do not have sexual contact if you have active symptoms of genital herpes. Contact a health care provider if: Your symptoms are not improving with medicine. Your symptoms return, or you have new symptoms. You have a fever. You have abdominal pain. You have redness, swelling, or pain in your eye. You notice new sores on other parts of your body. You have had herpes and you become pregnant or plan to become pregnant. Get help right away if: You have symptoms of viral meningitis. This is rare but may happen if the virus spreads to the brain. Symptoms may include: Severe headache or stiff neck. Muscle aches. Nausea and vomiting. Sensitivity to light. Summary Genital herpes is a common sexually transmitted infection (STI) that is caused by the herpes simplex virus, type 1 or type 2 (HSV-1 or HSV-2). These viruses are most often spread through sexual contact with an infected person. You are more likely to develop this condition if you have sex with many partners or you do not use condoms during sex. Most people do not have symptoms or have mild symptoms that may be mistaken for other skin problems. Symptoms occur as outbreaks that  may happen months or years apart. There is no cure for this condition, but treatment with oral antiviral medicines can reduce symptoms, reduce the chance of spreading the virus to a partner, prevent future outbreaks, or shorten future outbreaks. This information is not intended to replace advice given to you by your health care provider. Make sure you discuss any questions you have with your health care provider. Document Revised: 08/18/2021 Document Reviewed: 08/18/2021 Elsevier Patient Education  2023 Elsevier Inc.    If you have been instructed to have an in-person evaluation today at a local Urgent Care facility, please use the link below. It will take you to a list of all of our available [facility],  including address, phone number and hours of operation. Please do not delay care.  [facility]  If you or a family member do not have a primary care provider, use the link below to schedule a visit and establish care. When you choose a [facility] primary care physician or advanced practice provider, you gain a long-term partner in health. Find a Primary Care Provider  Learn more about [facility]'s in-office and virtual care options: [facility] - Get Care Now

## 2022-07-21 NOTE — Telephone Encounter (Signed)
Summary: Rx refill, symptomatic (Urgent)   Pt called requesting to have this filled, she is symptomatic and says she needs this asap. Says pharmacy requested this yesterday.   valACYclovir (VALTREX) 1000 MG tablet   [facility] ST[facility] #[id number] [place], [place] - [address] AT [facility]  [address] [place] [address]  Phone: [phone] Fax: [phone]        Chief Complaint: requesting medication refill. valtrex Symptoms: tiny cluster of "bumps" to outside vagina. No drainage. Painful.  Frequency: started yesterday  Pertinent Negatives: Patient denies fever Disposition: [] ED /[x] Urgent Care (no appt availability in office) / [] Appointment(In office/virtual)/ [x]  [facility]/ [] Home Care/ [] Refused Recommended Disposition /[] [facility]/ []  Follow-up with PCP Additional Notes:   In review of scheduling appt patient has been dismissed from [facility] as of 06/04/22. Recommended patient go to UC or try [facility]. Patient verbalized understanding.        Reason for Disposition  [1] Rash (e.g., redness, tiny bumps, sore) of genital area AND [2] present > 24 hours  Answer Assessment - Initial Assessment Questions 1. SYMPTOM: "What's the main symptom you're concerned about?" (e.g., pain, itching, dryness)     Bumps in clusters to outer area of vagina  2. LOCATION: "Where is the  "bumps" located?" (e.g., inside/outside, left/right)     Outside vagina 3. ONSET: "When did the  "bumps"  start?"     Yesterday  4. PAIN: "Is there any pain?" If Yes, ask: "How bad is it?" (Scale: 1-10; mild, moderate, severe)   -  MILD (1-3): Doesn't interfere with normal activities.    -  MODERATE (4-7): Interferes with normal activities (e.g., work or school) or awakens from sleep.     -  SEVERE (8-10): Excruciating pain, unable to do any normal activities.     Yes  5. ITCHING: "Is there any itching?" If Yes, ask: "How bad is it?" (Scale: 1-10; mild, moderate, severe)      na 6. CAUSE: "What do you think is causing the discharge?" "Have you had the same problem before? What happened then?"     Outbreak of herpes 7. OTHER SYMPTOMS: "Do you have any other symptoms?" (e.g., fever, itching, vaginal bleeding, pain with urination, injury to genital area, vaginal foreign body)     Irritation to vaginal area 8. PREGNANCY: "Is there any chance you are pregnant?" "When was your last menstrual period?"     na  Protocols used: Vaginal Symptoms-A-AH

## 2022-08-31 ENCOUNTER — Ambulatory Visit: Payer: Medicaid Other

## 2022-09-14 ENCOUNTER — Ambulatory Visit (LOCAL_COMMUNITY_HEALTH_CENTER): Payer: Medicaid Other

## 2022-09-14 VITALS — BP 135/81 | Ht 64.0 in | Wt 183.0 lb

## 2022-09-14 DIAGNOSIS — Z3042 Encounter for surveillance of injectable contraceptive: Secondary | ICD-10-CM

## 2022-09-14 DIAGNOSIS — Z309 Encounter for contraceptive management, unspecified: Secondary | ICD-10-CM | POA: Diagnosis not present

## 2022-09-14 DIAGNOSIS — Z3009 Encounter for other general counseling and advice on contraception: Secondary | ICD-10-CM

## 2022-09-14 NOTE — Progress Notes (Signed)
13 weeks 0 days post depo. Voices no concerns. Depo given per order by Collene Leyden, FNP dated 03/30/2022. Tolerated well L delt. Next depo due 11/30/2022. Has reminder. Josie Saunders, RN

## 2022-09-24 ENCOUNTER — Ambulatory Visit
Admission: EM | Admit: 2022-09-24 | Discharge: 2022-09-24 | Disposition: A | Discharge status: Home / Self Care | Payer: Medicaid Other | Attending: Physician Assistant | Admitting: Physician Assistant

## 2022-09-24 ENCOUNTER — Encounter: Payer: Self-pay | Admitting: Emergency Medicine

## 2022-09-24 DIAGNOSIS — N760 Acute vaginitis: Secondary | ICD-10-CM | POA: Insufficient documentation

## 2022-09-24 DIAGNOSIS — B9689 Other specified bacterial agents as the cause of diseases classified elsewhere: Secondary | ICD-10-CM | POA: Diagnosis present

## 2022-09-24 DIAGNOSIS — R0981 Nasal congestion: Secondary | ICD-10-CM | POA: Diagnosis present

## 2022-09-24 DIAGNOSIS — B379 Candidiasis, unspecified: Secondary | ICD-10-CM | POA: Insufficient documentation

## 2022-09-24 DIAGNOSIS — T3695XA Adverse effect of unspecified systemic antibiotic, initial encounter: Secondary | ICD-10-CM | POA: Diagnosis present

## 2022-09-24 LAB — WET PREP, GENITAL
Sperm: NONE SEEN
Trich, Wet Prep: NONE SEEN
WBC, Wet Prep HPF POC: <10 — AB (ref <10)
Yeast Wet Prep HPF POC: NONE SEEN

## 2022-09-24 LAB — URINALYSIS, MICROSCOPIC (REFLEX)
RBC / HPF: NONE SEEN RBC/hpf (ref 0–5)
Squamous Epithelial / HPF: >50 (ref 0–5)

## 2022-09-24 LAB — URINALYSIS, ROUTINE W REFLEX MICROSCOPIC
Glucose, UA: NEGATIVE mg/dL
Hgb urine dipstick: NEGATIVE
Ketones, ur: NEGATIVE mg/dL
Nitrite: NEGATIVE
Protein, ur: 30 mg/dL — AB
Specific Gravity, Urine: 1.02 (ref 1.005–1.030)
pH: 7 (ref 5.0–8.0)

## 2022-09-24 MED ORDER — METRONIDAZOLE 500 MG PO TABS: 500.0000 mg | ORAL_TABLET | Freq: Two times a day (BID) | ORAL | 0 refills | Status: AC

## 2022-09-24 MED ORDER — FLUCONAZOLE 150 MG PO TABS: 150.0000 mg | ORAL_TABLET | Freq: Every day | ORAL | 0 refills | Status: AC

## 2022-09-24 NOTE — ED Provider Notes (Signed)
[facility]    CSN: [id number] Arrival date & time: 09/24/22  1006      History   Chief Complaint Chief Complaint  Patient presents with   foreign smell in nose    HPI Jody Perez is a 31 y.o. female presenting for concerns about an abnormal body odor x 1 week.  She says it smells like ammonia.  She does not know if it is due to a sinus issue or if she has a smell coming from her urine or vagina.  She does report some vaginal discharge.  She says she feels like she can smell it worse when she is using the bathroom.  She has not had any nasal congestion and sinus pressure.  She does have a history of allergies but says they have not been bothering her recently.  She does not reveal any concern for STIs but she does have a history of chlamydia and trichomonas.  Also history of BV infection.  HPI  Past Medical History:  Diagnosis Date   BV (bacterial vaginosis)    Chlamydia    Herpes genitalis in women    HSV infection    Migraines    Miscarriage    Trichomonas    UTI (lower urinary tract infection)     Patient Active Problem List   Diagnosis Date Noted   Blurry vision 02/28/2022   Herniated disc, cervical 02/25/2022   Iron deficiency anemia, unspecified 02/25/2022   Hemorrhagic ovarian cyst 02/25/2022   Leukocytosis 02/25/2022   Incomplete miscarriage 01/01/22 01/24/2022   Trichomonas infection12/17/19 01/23/2022   Liposuction 08/2021 01/23/2022   Migraine without aura and without status migrainosus, not intractable 01/11/2021   Vapes nicotine containing substance 07/30/2020   Obesity BMI=29.9 04/07/2020   Recurrent vaginitis 06/06/2019   History of genital HSV 01/28/2013    Past Surgical History:  Procedure Laterality Date   LIPOSUCTION  09/01/2021   WISDOM TOOTH EXTRACTION      OB History     Gravida  6   Para  3   Term  3   Preterm  0   AB  3   Living  3      SAB  3   IAB  0   Ectopic  0   Multiple  0   Live Births  3             Home Medications    Prior to Admission medications   Medication Sig Start Date End Date Taking? Authorizing Provider  metroNIDAZOLE (FLAGYL) 500 MG tablet Take 1 tablet (500 mg total) by mouth 2 (two) times daily for 7 days. 09/24/22 10/01/22 Yes [name], PA-C  valACYclovir (VALTREX) 1000 MG tablet Take 1 tablet (1,000 mg total) by mouth daily. For 5 days For flare of HSV. 07/21/22   Burnette, Alessandra Bevels, PA-C    Family History Family History  Problem Relation Age of Onset   Hypertension Mother    Cancer Mother        Pitutary   Healthy Brother    Hypertension Brother    Healthy Father    Breast cancer Maternal Grandmother    Healthy Brother    Colon cancer Neg Hx    Ovarian cancer Neg Hx    Cervical cancer Neg Hx     Social History Social History   Tobacco Use   Smoking status: Former    Types: E-cigarettes, Cigars    Passive exposure: Never   Smokeless tobacco: Never  Vaping Use   Vaping Use: Every day   Substances: Flavoring  Substance Use Topics   Alcohol use: Yes    Alcohol/week: 4.0 standard drinks of alcohol    Types: 4 Shots of liquor per week    Comment: last use 05/2021   Drug use: Not Currently    Types: Marijuana    Comment: last use age 47     Allergies   Patient has no known allergies.   Review of Systems Review of Systems  Constitutional:  Negative for fatigue and fever.  HENT:  Negative for congestion, postnasal drip, rhinorrhea, sinus pressure, sinus pain and sore throat.   Respiratory:  Negative for cough.   Genitourinary:  Positive for vaginal discharge. Negative for difficulty urinating and dysuria.     Physical Exam Triage Vital Signs ED Triage Vitals  Enc Vitals Group     BP 09/24/22 1020 125/83     Pulse Rate 09/24/22 1020 96     Resp 09/24/22 1020 16     Temp 09/24/22 1020 99 F (37.2 C)     Temp Source 09/24/22 1020 Oral     SpO2 09/24/22 1020 97 %     Weight 09/24/22 1018 182 lb 15.7 oz (83 kg)      Height 09/24/22 1018 5\' 4"  (1.626 m)     Head Circumference --      Peak Flow --      Pain Score 09/24/22 1017 0     Pain Loc --      Pain Edu? --      Excl. in GC? --    No data found.  Updated Vital Signs BP 125/83 (BP Location: Left Arm)   Pulse 96   Temp 99 F (37.2 C) (Oral)   Resp 16   Ht 5\' 4"  (1.626 m)   Wt 182 lb 15.7 oz (83 kg)   SpO2 97%   BMI 31.41 kg/m       Physical Exam Vitals and nursing note reviewed.  Constitutional:      General: She is not in acute distress.    Appearance: Normal appearance. She is not ill-appearing or toxic-appearing.  HENT:     Head: Normocephalic and atraumatic.     Nose: Congestion present.     Mouth/Throat:     Mouth: Mucous membranes are moist.     Pharynx: Oropharynx is clear.  Eyes:     General: No scleral icterus.       Right eye: No discharge.        Left eye: No discharge.     Conjunctiva/sclera: Conjunctivae normal.  Cardiovascular:     Rate and Rhythm: Normal rate and regular rhythm.     Heart sounds: Normal heart sounds.  Pulmonary:     Effort: Pulmonary effort is normal. No respiratory distress.     Breath sounds: Normal breath sounds.  Musculoskeletal:     Cervical back: Neck supple.  Skin:    General: Skin is dry.  Neurological:     General: No focal deficit present.     Mental Status: She is alert. Mental status is at baseline.     Motor: No weakness.     Gait: Gait normal.  Psychiatric:        Mood and Affect: Mood normal.        Behavior: Behavior normal.        Thought Content: Thought content normal.      UC Treatments / Results  Labs (all  labs ordered are listed, but only abnormal results are displayed) Labs Reviewed  WET PREP, GENITAL - Abnormal; Notable for the following components:      Result Value   Clue Cells Wet Prep HPF POC PRESENT (*)    WBC, Wet Prep HPF POC <10 (*)    All other components within normal limits  URINALYSIS, ROUTINE W REFLEX MICROSCOPIC - Abnormal; Notable for  the following components:   APPearance HAZY (*)    Bilirubin Urine SMALL (*)    Protein, ur 30 (*)    Leukocytes,Ua TRACE (*)    All other components within normal limits  URINALYSIS, MICROSCOPIC (REFLEX) - Abnormal; Notable for the following components:   Bacteria, UA FEW (*)    All other components within normal limits  URINE CULTURE  CERVICOVAGINAL ANCILLARY ONLY    EKG   Radiology No results found.  Procedures Procedures (including critical care time)  Medications Ordered in UC Medications - No data to display  Initial Impression / Assessment and Plan / UC Course  I have reviewed the triage vital signs and the nursing notes.  Pertinent labs & imaging results that were available during my care of the patient were reviewed by me and considered in my medical decision making (see chart for details).   31 year old female presents for abnormal body odor for the past 1 week.  Also reports vaginal discharge.  She is not reporting any nasal congestion or sinus pressure.  Vitals normal and stable patient overall well-appearing.  On exam she does have nasal congestion and mild swelling.  No drainage.  Patient elected to perform vaginal self swab and to forego the pelvic exam.  We will assess urine for possible UTI and wet prep for possible BV, yeast, trichomonas and send out a GC/chlamydia swab.  Positive clue cells on wet prep.  We will treat for BV.  Suspect this is probably the odor that she has noticed.  Urinalysis shows trace leukocytes and few bacteria.  It is also heavily contaminated.  We will send urine for culture and if positive for bacteria we will call antibiotics and for patient.  Discussed this with her.  Encouraged her to increase her fluid intake.  Pending GC/chlamydia testing.  Also advised patient to use nasal spray because her nose is a bit swollen and that could be playing a role in her smell disturbance as well.  Follow-up as needed.  Final Clinical Impressions(s) /  UC Diagnoses   Final diagnoses:  Bacterial vaginosis  Nasal congestion     Discharge Instructions      -You have bacterial vaginosis.  I sent antibiotics to the pharmacy. - Also of nasal congestion.  I would use Flonase daily.  Hopefully treating both of these conditions will help with the odor that you are noticing. - We are sending your urine for culture to make sure you do not have a UTI.  We may call you and start you on more antibiotics.  Results of all will all be on MyChart.     ED Prescriptions     Medication Sig Dispense Auth. Provider   metroNIDAZOLE (FLAGYL) 500 MG tablet Take 1 tablet (500 mg total) by mouth 2 (two) times daily for 7 days. 14 tablet Gretta Cool      PDMP not reviewed this encounter.   Danton Clap, PA-C 09/24/22 1151

## 2022-09-24 NOTE — Discharge Instructions (Signed)
-  You have bacterial vaginosis.  I sent antibiotics to the pharmacy. - Also of nasal congestion.  I would use Flonase daily.  Hopefully treating both of these conditions will help with the odor that you are noticing. - We are sending your urine for culture to make sure you do not have a UTI.  We may call you and start you on more antibiotics.  Results of all will all be on MyChart.

## 2022-09-24 NOTE — ED Triage Notes (Signed)
Pt states she has the smell of ammonia in her nose. Started about a week ago. Denies sinus congestion or other symptoms.

## 2022-09-25 LAB — CERVICOVAGINAL ANCILLARY ONLY
Chlamydia: NEGATIVE
Comment: NEGATIVE
Comment: NORMAL
Neisseria Gonorrhea: NEGATIVE

## 2022-10-27 ENCOUNTER — Ambulatory Visit
Admission: EM | Admit: 2022-10-27 | Discharge: 2022-10-27 | Disposition: A | Discharge status: Home / Self Care | Payer: Medicaid Other | Attending: Family Medicine | Admitting: Family Medicine

## 2022-10-27 ENCOUNTER — Other Ambulatory Visit: Payer: Self-pay

## 2022-10-27 DIAGNOSIS — B379 Candidiasis, unspecified: Secondary | ICD-10-CM | POA: Diagnosis not present

## 2022-10-27 DIAGNOSIS — B9689 Other specified bacterial agents as the cause of diseases classified elsewhere: Secondary | ICD-10-CM

## 2022-10-27 DIAGNOSIS — N760 Acute vaginitis: Secondary | ICD-10-CM | POA: Diagnosis not present

## 2022-10-27 DIAGNOSIS — T3695XA Adverse effect of unspecified systemic antibiotic, initial encounter: Secondary | ICD-10-CM | POA: Insufficient documentation

## 2022-10-27 LAB — URINALYSIS, MICROSCOPIC (REFLEX)

## 2022-10-27 LAB — WET PREP, GENITAL
Sperm: NONE SEEN
Trich, Wet Prep: NONE SEEN
WBC, Wet Prep HPF POC: NONE SEEN — AB (ref <10)
Yeast Wet Prep HPF POC: NONE SEEN

## 2022-10-27 LAB — URINALYSIS, ROUTINE W REFLEX MICROSCOPIC
Bilirubin Urine: NEGATIVE
Glucose, UA: NEGATIVE mg/dL
Hgb urine dipstick: NEGATIVE
Ketones, ur: NEGATIVE mg/dL
Leukocytes,Ua: NEGATIVE
Nitrite: NEGATIVE
Protein, ur: 30 mg/dL — AB
Specific Gravity, Urine: 1.025 (ref 1.005–1.030)
pH: 6.5 (ref 5.0–8.0)

## 2022-10-27 MED ORDER — FLUCONAZOLE 150 MG PO TABS: 150.0000 mg | ORAL_TABLET | ORAL | 0 refills | Status: AC

## 2022-10-27 MED ORDER — METRONIDAZOLE 500 MG PO TABS: 500.0000 mg | ORAL_TABLET | Freq: Two times a day (BID) | ORAL | 0 refills | Status: DC

## 2022-10-27 NOTE — ED Triage Notes (Addendum)
Pt is here with vaginal odor that started days ago, pt has not taken anything to relieve discomfort. Pt requested to be tested for everything.

## 2022-10-27 NOTE — Discharge Instructions (Signed)
Stop by the pharmacy to pick up your prescriptions.  Follow up with your primary care provider as needed.  Someone will call you if your urine culture or STD testing is abnormal and requires treatment.

## 2022-10-27 NOTE — ED Provider Notes (Signed)
[facility]    CSN: [id number] Arrival date & time: 10/27/22  1003      History   Chief Complaint Chief Complaint  Patient presents with   vaginal issues     HPI HPI Jody Perez is a 31 y.o. female.    Jody Perez feels like she vaginal odor for the past 2 days. She has an abnormal odor that she can't explain. Her partner is not smelling the odor.  She has Breighner odor when she urinates.  She had a "tingling" throughout the day. No, urinary urgency and frequency. She drinks water throughout the day.  No blood in urine, vomiting or nausea. She had 2 episodes of diarrhea this week.   Took medications for yeast infection and BV a couple weeks. Her symptoms came back.   Patient reports no known exposure to an STIs but requests testing.  Her partner is having no symptoms.  They do not use condoms.  Denies fever, abdominal cramping, pelvic pain, vaginal bleeding, new rash, nausea, vomiting, back pain. No LMP recorded. Patient has had an injection.      Past Medical History:  Diagnosis Date   BV (bacterial vaginosis)    Chlamydia    Herpes genitalis in women    HSV infection    Migraines    Miscarriage    Trichomonas    UTI (lower urinary tract infection)     Patient Active Problem List   Diagnosis Date Noted   Blurry vision 02/28/2022   Herniated disc, cervical 02/25/2022   Iron deficiency anemia, unspecified 02/25/2022   Hemorrhagic ovarian cyst 02/25/2022   Leukocytosis 02/25/2022   Incomplete miscarriage 01/01/22 01/24/2022   Trichomonas infection12/17/19 01/23/2022   Liposuction 08/2021 01/23/2022   Migraine without aura and without status migrainosus, not intractable 01/11/2021   Vapes nicotine containing substance 07/30/2020   Obesity BMI=29.9 04/07/2020   Recurrent vaginitis 06/06/2019   History of genital HSV 01/28/2013    Past Surgical History:  Procedure Laterality Date   LIPOSUCTION  09/01/2021   WISDOM TOOTH EXTRACTION      OB History      Gravida  6   Para  3   Term  3   Preterm  0   AB  3   Living  3      SAB  3   IAB  0   Ectopic  0   Multiple  0   Live Births  3            Home Medications    Prior to Admission medications   Medication Sig Start Date End Date Taking? Authorizing Provider  fluconazole (DIFLUCAN) 150 MG tablet Take 1 tablet (150 mg total) by mouth every 3 (three) days for 2 doses. 10/27/22 10/31/22 Yes Eila Runyan, DO  metroNIDAZOLE (FLAGYL) 500 MG tablet Take 1 tablet (500 mg total) by mouth 2 (two) times daily. 10/27/22  Yes Jaiona Simien, DO  valACYclovir (VALTREX) 1000 MG tablet Take 1 tablet (1,000 mg total) by mouth daily. For 5 days For flare of HSV. 07/21/22   Margaretann Loveless, PA-C    Family History Family History  Problem Relation Age of Onset   Hypertension Mother    Cancer Mother        Pitutary   Healthy Brother    Hypertension Brother    Healthy Father    Breast cancer Maternal Grandmother    Healthy Brother    Colon cancer Neg Hx    Ovarian cancer Neg  Hx    Cervical cancer Neg Hx     Social History Social History   Tobacco Use   Smoking status: Former    Types: Cigars, E-cigarettes    Passive exposure: Never   Smokeless tobacco: Never  Vaping Use   Vaping Use: Every day   Substances: Flavoring  Substance Use Topics   Alcohol use: Yes    Alcohol/week: 4.0 standard drinks of alcohol    Types: 4 Shots of liquor per week    Comment: last use 05/2021   Drug use: Not Currently    Types: Marijuana    Comment: last use age 89     Allergies   Patient has no known allergies.   Review of Systems Review of Systems: :negative unless otherwise stated in HPI.      Physical Exam Triage Vital Signs ED Triage Vitals  Enc Vitals Group     BP 10/27/22 1058 136/83     Pulse Rate 10/27/22 1058 83     Resp 10/27/22 1058 18     Temp 10/27/22 1058 98.9 F (37.2 C)     Temp Source 10/27/22 1058 Oral     SpO2 10/27/22 1058 100 %     Weight  --      Height --      Head Circumference --      Peak Flow --      Pain Score 10/27/22 1056 0     Pain Loc --      Pain Edu? --      Excl. in GC? --    No data found.  Updated Vital Signs BP 136/83 (BP Location: Left Arm)   Pulse 83   Temp 98.9 F (37.2 C) (Oral)   Resp 18   SpO2 100%   Visual Acuity Right Eye Distance:   Left Eye Distance:   Bilateral Distance:    Right Eye Near:   Left Eye Near:    Bilateral Near:     Physical Exam GEN: well appearing female in no acute distress  CVS: well perfused  RESP: speaking in full sentences without pause  GU: deferred, patient performed self swab  SKIN: warm and dry    UC Treatments / Results  Labs (all labs ordered are listed, but only abnormal results are displayed) Labs Reviewed  WET PREP, GENITAL - Abnormal; Notable for the following components:      Result Value   Clue Cells Wet Prep HPF POC PRESENT (*)    WBC, Wet Prep HPF POC NONE SEEN (*)    All other components within normal limits  URINALYSIS, ROUTINE W REFLEX MICROSCOPIC - Abnormal; Notable for the following components:   Protein, ur 30 (*)    All other components within normal limits  URINALYSIS, MICROSCOPIC (REFLEX) - Abnormal; Notable for the following components:   Bacteria, UA FEW (*)    All other components within normal limits  URINE CULTURE  CERVICOVAGINAL ANCILLARY ONLY    EKG   Radiology No results found.  Procedures Procedures (including critical care time)  Medications Ordered in UC Medications - No data to display  Initial Impression / Assessment and Plan / UC Course  I have reviewed the triage vital signs and the nursing notes.  Pertinent labs & imaging results that were available during my care of the patient were reviewed by me and considered in my medical decision making (see chart for details).      Patient is a 31 y.o. female  who  presents for 2 days of vaginal odor and urinary "tingling." Overall patient is  well-appearing and afebrile.  Vital signs stable.  UA not consistent with acute cystitis.  No hematuria seen. Urine culture obtained.  Follow-up sensitivities and start appropriate  antibiotics, if needed.  Gonorrhea and Chlamydia swab obtained.  Wet prep obtained and is consistent with bacterial vaginosis.  Offered vaginal Flagyl but patient declined.  Treat with oral Flagyl.  She has history of antibiotic associated yeast infections.  Diflucan sent to the pharmacy for prophylaxis.  Discussed prevention of bacterial vaginosis including but not limited to using condoms, avoiding scented vaginal washes and detergents.  She may be a candidate for boric acid suppositories or MetroGel for chronic BV.  Advised patient to not drink alcohol while taking this medication.  Return precautions including abdominal pain, fever, chills, nausea or vomiting given.   Discussed MDM, treatment plan and plan for follow-up with patient/parent who agrees with plan.    Final Clinical Impressions(s) / UC Diagnoses   Final diagnoses:  Antibiotic-induced yeast infection  BV (bacterial vaginosis)     Discharge Instructions      Stop by the pharmacy to pick up your prescriptions.  Follow up with your primary care provider as needed.  Someone will call you if your urine culture or STD testing is abnormal and requires treatment.       ED Prescriptions     Medication Sig Dispense Auth. Provider   metroNIDAZOLE (FLAGYL) 500 MG tablet Take 1 tablet (500 mg total) by mouth 2 (two) times daily. 14 tablet Azelea Seguin, DO   fluconazole (DIFLUCAN) 150 MG tablet Take 1 tablet (150 mg total) by mouth every 3 (three) days for 2 doses. 2 tablet Katha Cabal, DO      PDMP not reviewed this encounter.   Katha Cabal, DO 10/27/22 1237

## 2022-10-28 LAB — URINE CULTURE: Culture: NO GROWTH

## 2022-10-30 LAB — CERVICOVAGINAL ANCILLARY ONLY
Chlamydia: NEGATIVE
Comment: NEGATIVE
Comment: NEGATIVE
Comment: NORMAL
Neisseria Gonorrhea: NEGATIVE
Trichomonas: NEGATIVE

## 2022-11-02 ENCOUNTER — Ambulatory Visit (INDEPENDENT_AMBULATORY_CARE_PROVIDER_SITE_OTHER): Payer: Medicaid Other | Admitting: Dermatology

## 2022-11-02 DIAGNOSIS — Z79899 Other long term (current) drug therapy: Secondary | ICD-10-CM

## 2022-11-02 DIAGNOSIS — L819 Disorder of pigmentation, unspecified: Secondary | ICD-10-CM | POA: Diagnosis not present

## 2022-11-02 DIAGNOSIS — L700 Acne vulgaris: Secondary | ICD-10-CM | POA: Diagnosis not present

## 2022-11-02 DIAGNOSIS — L905 Scar conditions and fibrosis of skin: Secondary | ICD-10-CM | POA: Diagnosis not present

## 2022-11-02 MED ORDER — ADAPALENE 0.3 % EX GEL: 1.0000 "application " | Freq: Every day | CUTANEOUS | 3 refills | Status: DC

## 2022-11-02 MED ORDER — DOXYCYCLINE HYCLATE 20 MG PO TABS: 20.0000 mg | ORAL_TABLET | Freq: Every day | ORAL | 3 refills | Status: AC

## 2022-11-02 NOTE — Progress Notes (Signed)
   New Patient Visit  Subjective  Jody Perez is a 31 y.o. female who presents for the following: Acne (Breakouts leave a lot of hyperpigmentation - uses Cetaphil wash and moisturizer).  The following portions of the chart were reviewed this encounter and updated as appropriate:   Tobacco  Allergies  Meds  Problems  Med Hx  Surg Hx  Fam Hx     Review of Systems:  No other skin or systemic complaints except as noted in HPI or Assessment and Plan.  Objective  Well appearing patient in no apparent distress; mood and affect are within normal limits.  A focused examination was performed including face. Relevant physical exam findings are noted in the Assessment and Plan.  Face          Assessment & Plan  Acne vulgaris With hyperpigmentation and scarring Face See photos Start : Doxycycline 20 mg 1 po qd with food and plenty of fluid,  Adapalene 0.3% gel qhs. Continue Cetaphil cleanser and moisturizer.  doxycycline (PERIOSTAT) 20 MG tablet - Face Take 1 tablet (20 mg total) by mouth daily. With food and plenty of fluid Adapalene (DIFFERIN) 0.3 % gel - Face Apply 1 application  topically at bedtime.  Return in about 3 months (around 02/01/2023) for Acne.  I, Joanie Coddington, CMA, am acting as scribe for Armida Sans, MD . Documentation: I have reviewed the above documentation for accuracy and completeness, and I agree with the above.  Armida Sans, MD

## 2022-11-02 NOTE — Patient Instructions (Signed)
Your prescription was sent to [facility] in [place]. A representative from [facility] will contact you within 2 business hours to verify your address and insurance information to schedule a free delivery. If for any reason you do not receive a phone call from them, please reach out to them. Their phone number is [phone] and their hours are Monday-Friday 9:00 am-5:00 pm.      Doxycycline should be taken with food to prevent nausea. Do not lay down for 30 minutes after taking. Be cautious with sun exposure and use good sun protection while on this medication. Pregnant women should not take this medication.     Topical retinoid medications like tretinoin/Retin-A, adapalene/Differin, tazarotene/Fabior, and Epiduo/Epiduo Forte can cause dryness and irritation when first started. Only apply a pea-sized amount to the entire affected area. Avoid applying it around the eyes, edges of mouth and creases at the nose. If you experience irritation, use a good moisturizer first and/or apply the medicine less often. If you are doing well with the medicine, you can increase how often you use it until you are applying every night. Be careful with sun protection while using this medication as it can make you sensitive to the sun. This medicine should not be used by pregnant women.     Due to recent changes in healthcare laws, you may see results of your pathology and/or laboratory studies on MyChart before the doctors have had a chance to review them. We understand that in some cases there may be results that are confusing or concerning to you. Please understand that not all results are received at the same time and often the doctors may need to interpret multiple results in order to provide you with the best plan of care or course of treatment. Therefore, we ask that you please give [place] 2 business days to thoroughly review all your results before contacting the office for clarification. Should we see a  critical lab result, you will be contacted sooner.   If You Need Anything After Your Visit  If you have any questions or concerns for your doctor, please call our main line at [phone] and press option 4 to reach your doctor's medical assistant. If no one answers, please leave a voicemail as directed and we will return your call as soon as possible. Messages left after 4 pm will be answered the following business day.   You may also send [place] a message via MyChart. We typically respond to MyChart messages within 1-2 business days.  For prescription refills, please ask your pharmacy to contact our office. Our fax number is [phone].  If you have an urgent issue when the clinic is closed that cannot wait until the next business day, you can page your doctor at the number below.    Please note that while we do our best to be available for urgent issues outside of office hours, we are not available 24/7.   If you have an urgent issue and are unable to reach [place], you may choose to seek medical care at your doctor's office, retail clinic, urgent care center, or emergency room.  If you have a medical emergency, please immediately call 911 or go to the emergency department.  Pager Numbers  - Dr. Gwen Pounds: [phone]  - Dr. Neale Burly: [phone]  - Dr. Roseanne Reno: [phone]  In the event of inclement weather, please call our main line at [phone] for an update on the status of any delays or closures.  Dermatology Medication Tips:  Please keep the boxes that topical medications come in in order to help keep track of the instructions about where and how to use these. Pharmacies typically print the medication instructions only on the boxes and not directly on the medication tubes.   If your medication is too expensive, please contact our office at [phone] option 4 or send [place] a message through MyChart.   We are unable to tell what your co-pay for medications will be in advance as  this is different depending on your insurance coverage. However, we may be able to find a substitute medication at lower cost or fill out paperwork to get insurance to cover a needed medication.   If a prior authorization is required to get your medication covered by your insurance company, please allow [place] 1-2 business days to complete this process.  Drug prices often vary depending on where the prescription is filled and some pharmacies may offer cheaper prices.  The website www.goodrx.com contains coupons for medications through different pharmacies. The prices here do not account for what the cost may be with help from insurance (it may be cheaper with your insurance), but the website can give you the price if you did not use any insurance.  - You can print the associated coupon and take it with your prescription to the pharmacy.  - You may also stop by our office during regular business hours and pick up a GoodRx coupon card.  - If you need your prescription sent electronically to a different pharmacy, notify our office through [facility] or by phone at [phone] option 4.     Si Usted Necesita Algo Despus de Su Visita  Tambin puede enviarnos un mensaje a travs de Clinical cytogeneticist. Por lo general respondemos a los mensajes de MyChart en el transcurso de 1 a 2 das hbiles.  Para renovar recetas, por favor pida a su farmacia que se ponga en contacto con nuestra oficina. Annie Sable de fax es [place] [phone].  Si tiene un asunto urgente cuando la clnica est cerrada y que no puede esperar hasta el siguiente da hbil, puede llamar/localizar a su doctor(a) al nmero que aparece a continuacin.   Por favor, tenga en cuenta que aunque hacemos todo lo posible para estar disponibles para asuntos urgentes fuera del horario de [place], no estamos disponibles las 24 horas del da, los 7 [address]  [address] de la [place].   Si tiene un problema urgente y no puede comunicarse con nosotros, puede optar por  buscar atencin mdica  en el consultorio de su doctor(a), en una clnica privada, en un centro de atencin urgente o en una sala de emergencias.  Si tiene Engineer, drilling, por favor llame inmediatamente al 911 o vaya a la sala de emergencias.  Nmeros de bper  - Dr. Gwen Pounds: [phone]  - Dra. Moye: [phone]  - Dra. Roseanne Reno: [phone]  En caso de inclemencias del [place], por favor llame a Lacy Duverney principal al [phone] para una actualizacin sobre el [place] de cualquier retraso o cierre.  Consejos para la medicacin en dermatologa: Por favor, guarde las cajas en las que vienen los medicamentos de uso tpico para ayudarle a seguir las instrucciones sobre dnde y cmo usarlos. Las farmacias generalmente imprimen las instrucciones del medicamento slo en las cajas y no directamente en los tubos del [place].   Si su medicamento es muy caro, por favor, pngase en contacto con Rolm Gala llamando al [phone] y presione la opcin 4 o envenos un mensaje a Annette Stable  de MyChart.   No podemos decirle cul ser su copago por los medicamentos por adelantado ya que esto es diferente dependiendo de la cobertura de su seguro. Sin embargo, es posible que podamos encontrar un medicamento sustituto a Audiological scientist un formulario para que el seguro cubra el medicamento que se considera necesario.   Si se requiere una autorizacin previa para que su compaa de seguros [place] su medicamento, por favor permtanos de 1 a 2 das hbiles para completar [address]  Los precios de los medicamentos varan con frecuencia dependiendo del Environmental consultant de dnde se surte la receta y alguna farmacias pueden ofrecer precios ms baratos.  El sitio web www.goodrx.com tiene cupones para medicamentos de Health and safety inspector. Los precios aqu no tienen en cuenta lo que podra costar con la ayuda del seguro (puede ser ms barato con su seguro), pero el sitio web puede darle el precio si no  utiliz Tourist information centre manager.  - Puede imprimir el cupn correspondiente y llevarlo con su receta a la farmacia.  - Tambin puede pasar por nuestra oficina durante el horario de atencin regular y Education officer, museum una tarjeta de cupones de GoodRx.  - Si necesita que su receta se enve electrnicamente a una farmacia diferente, informe a nuestra oficina a travs de MyChart de [place] o por telfono llamando al [phone] y presione la opcin 4.

## 2022-11-04 ENCOUNTER — Telehealth: Payer: Medicaid Other | Admitting: Physician Assistant

## 2022-11-04 DIAGNOSIS — A6004 Herpesviral vulvovaginitis: Secondary | ICD-10-CM

## 2022-11-04 MED ORDER — VALACYCLOVIR HCL 1 G PO TABS: 1000.0000 mg | ORAL_TABLET | Freq: Every day | ORAL | 0 refills | Status: DC

## 2022-11-04 NOTE — Progress Notes (Signed)
Virtual Visit Consent   Jody Perez, you are scheduled for a virtual visit with a [facility] provider today. Just as with appointments in the office, your consent must be obtained to participate. Your consent will be active for this visit and any virtual visit you may have with one of our providers in the next 365 days. If you have a MyChart account, a copy of this consent can be sent to you electronically.  As this is a virtual visit, video technology does not allow for your provider to perform a traditional examination. This may limit your provider's ability to fully assess your condition. If your provider identifies any concerns that need to be evaluated in person or the need to arrange testing (such as labs, EKG, etc.), we will make arrangements to do so. Although advances in technology are sophisticated, we cannot ensure that it will always work on either your end or our end. If the connection with a video visit is poor, the visit may have to be switched to a telephone visit. With either a video or telephone visit, we are not always able to ensure that we have a secure connection.  By engaging in this virtual visit, you consent to the provision of healthcare and authorize for your insurance to be billed (if applicable) for the services provided during this visit. Depending on your insurance coverage, you may receive a charge related to this service.  I need to obtain your verbal consent now. Are you willing to proceed with your visit today? Jody Perez has provided verbal consent on 11/04/2022 for a virtual visit (video or telephone). Jarold Motto, [place]  Date: 11/04/2022 10:52 AM  Virtual Visit via Video Note   I, Jarold Motto, connected with  Jody Perez  ([id number], [birth year]) on 11/04/22 at 10:45 AM EST by a video-enabled telemedicine application and verified that I am speaking with the correct person using two identifiers.  Location: Patient: Virtual Visit Location Patient:  Home Provider: Virtual Visit Location Provider: Home Office   I discussed the limitations of evaluation and management by telemedicine and the availability of in person appointments. The patient expressed understanding and agreed to proceed.    History of Present Illness: Jody Perez is a 31 y.o. who identifies as a female who was assigned female at birth, and is being seen today for HSV. She has hx of genital HSV. Having an outbreak and needs refill on medication. She denies any concerns for other STDs, pregnancy. Outbreak is similar to outbreaks in the past. Denies: fever, chills, body aches.  Problems:  Patient Active Problem List   Diagnosis Date Noted   Blurry vision 02/28/2022   Herniated disc, cervical 02/25/2022   Iron deficiency anemia, unspecified 02/25/2022   Hemorrhagic ovarian cyst 02/25/2022   Leukocytosis 02/25/2022   Incomplete miscarriage 01/01/22 01/24/2022   Trichomonas infection12/17/19 01/23/2022   Liposuction 08/2021 01/23/2022   Migraine without aura and without status migrainosus, not intractable 01/11/2021   Vapes nicotine containing substance 07/30/2020   Obesity BMI=29.9 04/07/2020   Recurrent vaginitis 06/06/2019   History of genital HSV 01/28/2013    Allergies: No Known Allergies Medications:  Current Outpatient Medications:    Adapalene (DIFFERIN) 0.3 % gel, Apply 1 application  topically at bedtime., Disp: 45 g, Rfl: 3   doxycycline (PERIOSTAT) 20 MG tablet, Take 1 tablet (20 mg total) by mouth daily. With food and plenty of fluid, Disp: 30 tablet, Rfl: 3   metroNIDAZOLE (FLAGYL) 500 MG tablet, Take 1 tablet (500 mg  total) by mouth 2 (two) times daily., Disp: 14 tablet, Rfl: 0   valACYclovir (VALTREX) 1000 MG tablet, Take 1 tablet (1,000 mg total) by mouth daily. For 5 days For flare of HSV., Disp: 30 tablet, Rfl: 0  Current Facility-Administered Medications:    medroxyPROGESTERone (DEPO-PROVERA) injection 150 mg, 150 mg, Intramuscular, Q90 days, White,  Ayo, FNP, 150 mg at 09/14/22 0844  Observations/Objective: Patient is well-developed, well-nourished in no acute distress.  Resting comfortably at home.  Head is normocephalic, atraumatic.  No labored breathing. Speech is clear and coherent with logical content.  Patient is alert and oriented at baseline.   Assessment and Plan: 1. Herpes simplex vulvovaginitis - valACYclovir (VALTREX) 1000 MG tablet; Take 1 tablet (1,000 mg total) by mouth daily. For 5 days For flare of HSV.  Dispense: 30 tablet; Refill: 0  Recent flare No red flags on discussion Will treat acute flare Recommend close follow-up with PCP or gyn to discuss possible suppression if needed in future  Follow Up Instructions: I discussed the assessment and treatment plan with the patient. The patient was provided an opportunity to ask questions and all were answered. The patient agreed with the plan and demonstrated an understanding of the instructions.  A copy of instructions were sent to the patient via MyChart unless otherwise noted below.    The patient was advised to call back or seek an in-person evaluation if the symptoms worsen or if the condition fails to improve as anticipated.  Time:  I spent 5-10 minutes with the patient via telehealth technology discussing the above problems/concerns.    Jarold Motto, [place]

## 2022-11-09 ENCOUNTER — Encounter: Payer: Self-pay | Admitting: *Deleted

## 2022-11-12 ENCOUNTER — Encounter: Payer: Self-pay | Admitting: Dermatology

## 2022-12-25 ENCOUNTER — Ambulatory Visit (LOCAL_COMMUNITY_HEALTH_CENTER): Payer: Medicaid Other

## 2022-12-25 VITALS — BP 124/83 | Ht 64.0 in | Wt 185.5 lb

## 2022-12-25 DIAGNOSIS — Z3009 Encounter for other general counseling and advice on contraception: Secondary | ICD-10-CM

## 2022-12-25 DIAGNOSIS — Z309 Encounter for contraceptive management, unspecified: Secondary | ICD-10-CM

## 2022-12-25 DIAGNOSIS — Z3042 Encounter for surveillance of injectable contraceptive: Secondary | ICD-10-CM | POA: Diagnosis not present

## 2022-12-25 NOTE — Progress Notes (Signed)
Patient seen in nurse clinic 14 weeks and 4 days post hormonal injection.  Reported no concerns or problems. Discussed the visits are recommended to be between 11 and 13 weeks. Patient reminded that her annual physical exam is due 01/23/2023 and the next Depo due 03/12/2023.  Patient declined a reminder card.  Medroxyprogesterone Acetate 150 mg. Given as ordered by Gregary Cromer, FNP on 03/30/2022 for 1 year. Given in right deltoid. Tolerated well.

## 2023-01-01 ENCOUNTER — Ambulatory Visit
Admission: EM | Admit: 2023-01-01 | Discharge: 2023-01-01 | Disposition: A | Discharge status: Home / Self Care | Payer: Medicaid Other | Attending: Family Medicine | Admitting: Family Medicine

## 2023-01-01 DIAGNOSIS — Z1152 Encounter for screening for COVID-19: Secondary | ICD-10-CM | POA: Diagnosis not present

## 2023-01-01 DIAGNOSIS — J101 Influenza due to other identified influenza virus with other respiratory manifestations: Secondary | ICD-10-CM | POA: Diagnosis present

## 2023-01-01 LAB — RESP PANEL BY RT-PCR (RSV, FLU A&B, COVID)  RVPGX2
Influenza A by PCR: NEGATIVE
Influenza B by PCR: POSITIVE — AB
Resp Syncytial Virus by PCR: NEGATIVE
SARS Coronavirus 2 by RT PCR: NEGATIVE

## 2023-01-01 MED ORDER — OSELTAMIVIR PHOSPHATE 75 MG PO CAPS: 75.0000 mg | ORAL_CAPSULE | Freq: Two times a day (BID) | ORAL | 0 refills | Status: DC

## 2023-01-01 MED ORDER — IBUPROFEN 800 MG PO TABS
800.0000 mg | ORAL_TABLET | Freq: Once | ORAL | Status: AC
  Administered 2023-01-01: 800 mg via ORAL

## 2023-01-01 NOTE — ED Provider Notes (Signed)
[facility]    CSN: [id number] Arrival date & time: 01/01/23  1327      History   Chief Complaint Chief Complaint  Patient presents with   Generalized Body Aches   Cough    HPI Jody Perez is a 32 y.o. female.   HPI   Jody Perez presents for body aches and cough that started yesterday. Her kids have similar sx. Has chills and feels warm. Headache resolved with medications. Has sore throat and productive cough, rhinorrhea, diarrhea and nausea.  Took Dayquil and Nyquil without relief.         Past Medical History:  Diagnosis Date   BV (bacterial vaginosis)    Chlamydia    Herpes genitalis in women    HSV infection    Migraines    Miscarriage    Trichomonas    UTI (lower urinary tract infection)     Patient Active Problem List   Diagnosis Date Noted   Blurry vision 02/28/2022   Herniated disc, cervical 02/25/2022   Iron deficiency anemia, unspecified 02/25/2022   Hemorrhagic ovarian cyst 02/25/2022   Leukocytosis 02/25/2022   Incomplete miscarriage 01/01/22 01/24/2022   Trichomonas infection12/17/19 01/23/2022   Liposuction 08/2021 01/23/2022   Migraine without aura and without status migrainosus, not intractable 01/11/2021   Vapes nicotine containing substance 07/30/2020   Obesity BMI=29.9 04/07/2020   Recurrent vaginitis 06/06/2019   History of genital HSV 01/28/2013    Past Surgical History:  Procedure Laterality Date   LIPOSUCTION  09/01/2021   WISDOM TOOTH EXTRACTION      OB History     Gravida  6   Para  3   Term  3   Preterm  0   AB  3   Living  3      SAB  3   IAB  0   Ectopic  0   Multiple  0   Live Births  3            Home Medications    Prior to Admission medications   Medication Sig Start Date End Date Taking? Authorizing Provider  oseltamivir (TAMIFLU) 75 MG capsule Take 1 capsule (75 mg total) by mouth every 12 (twelve) hours. 01/01/23  Yes Solita Macadam, DO  Adapalene (DIFFERIN) 0.3 % gel Apply 1  application  topically at bedtime. 11/02/22   Ralene Bathe, MD  doxycycline (PERIOSTAT) 20 MG tablet Take 1 tablet (20 mg total) by mouth daily. With food and plenty of fluid 11/02/22 03/02/23  Ralene Bathe, MD  metroNIDAZOLE (FLAGYL) 500 MG tablet Take 1 tablet (500 mg total) by mouth 2 (two) times daily. 10/27/22   Iyonnah Ferrante, Ronnette Juniper, DO  valACYclovir (VALTREX) 1000 MG tablet Take 1 tablet (1,000 mg total) by mouth daily. For 5 days For flare of HSV. 11/04/22   Inda Coke, PA    Family History Family History  Problem Relation Age of Onset   Hypertension Mother    Cancer Mother        Pitutary   Healthy Brother    Hypertension Brother    Healthy Father    Breast cancer Maternal Grandmother    Healthy Brother    Colon cancer Neg Hx    Ovarian cancer Neg Hx    Cervical cancer Neg Hx     Social History Social History   Tobacco Use   Smoking status: Former    Types: Cigars, E-cigarettes    Passive exposure: Never   Smokeless tobacco: Never  Vaping Use   Vaping Use: Every day   Substances: Flavoring  Substance Use Topics   Alcohol use: Yes    Alcohol/week: 4.0 standard drinks of alcohol    Types: 4 Shots of liquor per week    Comment: last use 05/2021   Drug use: Not Currently    Types: Marijuana    Comment: last use age 71     Allergies   Patient has no known allergies.   Review of Systems Review of Systems: negative unless otherwise stated in HPI.      Physical Exam Triage Vital Signs ED Triage Vitals  Enc Vitals Group     BP 01/01/23 1433 122/87     Pulse Rate 01/01/23 1433 97     Resp --      Temp 01/01/23 1433 99.6 F (37.6 C)     Temp Source 01/01/23 1433 Oral     SpO2 01/01/23 1433 100 %     Weight 01/01/23 1430 185 lb 8 oz (84.1 kg)     Height 01/01/23 1430 5\' 4"  (1.626 m)     Head Circumference --      Peak Flow --      Pain Score 01/01/23 1432 6     Pain Loc --      Pain Edu? --      Excl. in [place]? --    No data found.  Updated  Vital Signs BP 122/87 (BP Location: Left Arm)   Pulse 97   Temp 99.6 F (37.6 C) (Oral)   Ht 5\' 4"  (1.626 m)   Wt 84.1 kg   SpO2 100%   BMI 31.84 kg/m   Visual Acuity Right Eye Distance:   Left Eye Distance:   Bilateral Distance:    Right Eye Near:   Left Eye Near:    Bilateral Near:     Physical Exam GEN:     alert, ill but non-toxic appearing female in no distress    HENT:  mucus membranes moist, oropharyngeal without lesions or exudate, no tonsillar hypertrophy,  mild oropharyngeal erythema ,  moderate erythematous edematous turbinates EYES:   pupils equal and reactive, no scleral injection or discharge NECK:  normal ROM, no meningismus   RESP:  no increased work of breathing, clear to auscultation bilaterally CVS:   regular rate and rhythm Skin:   warm and dry    UC Treatments / Results  Labs (all labs ordered are listed, but only abnormal results are displayed) Labs Reviewed  RESP PANEL BY RT-PCR (RSV, FLU A&B, COVID)  RVPGX2 - Abnormal; Notable for the following components:      Result Value   Influenza B by PCR POSITIVE (*)    All other components within normal limits    EKG   Radiology No results found.  Procedures Procedures (including critical care time)  Medications Ordered in UC Medications  ibuprofen (ADVIL) tablet 800 mg (800 mg Oral Given 01/01/23 1548)    Initial Impression / Assessment and Plan / UC Course  I have reviewed the triage vital signs and the nursing notes.  Pertinent labs & imaging results that were available during my care of the patient were reviewed by me and considered in my medical decision making (see chart for details).       Pt is a 32 y.o. female who presents for 1-2 days of respiratory symptoms. Jody Perez has an elevated temperature here of 99.6 F.  Satting well on room air. Overall pt is non-toxic appearing,  well hydrated, without respiratory distress. Pulmonary exam is unremarkable.  COVID and influenza testing  obtained and was influenza B positive.  History consistent with viral respiratory illness. Discussed symptomatic treatment.  She is interested in Tamiflu.  Prescription sent to the pharmacy.  Typical duration of symptoms discussed.  Work note offered but not needed.  Return and ED precautions given and voiced understanding. Discussed MDM, treatment plan and plan for follow-up with patient who agrees with plan.     Final Clinical Impressions(s) / UC Diagnoses   Final diagnoses:  Influenza B     Discharge Instructions      You have influenza B.  Staff at the pharmacy to pick up your medications of any were prescribed to you.   You can take Tylenol and/or Ibuprofen as needed for fever reduction and pain relief.    For cough: honey 1/2 to 1 teaspoon (you can dilute the honey in water or another fluid).  You can also use guaifenesin and dextromethorphan for cough. You can use a humidifier for chest congestion and cough.  If you don't have a humidifier, you can sit in the bathroom with the hot shower running.      For sore throat: try warm salt water gargles, Mucinex sore throat cough drops or cepacol lozenges, throat spray, warm tea or water with lemon/honey, popsicles or ice, or OTC cold relief medicine for throat discomfort. You can also purchase chloraseptic spray at the pharmacy or dollar store.   For congestion: take a daily anti-histamine like Zyrtec, Claritin, and a oral decongestant, such as pseudoephedrine.  You can also use Flonase 1-2 sprays in each nostril daily. Afrin is also a good option, if you do not have high blood pressure.    It is important to stay hydrated: drink plenty of fluids (water, gatorade/powerade/pedialyte, juices, or teas) to keep your throat moisturized and help further relieve irritation/discomfort.    Return or go to the Emergency Department if symptoms worsen or do not improve in the next few days      ED Prescriptions     Medication Sig Dispense  Auth. Provider   oseltamivir (TAMIFLU) 75 MG capsule Take 1 capsule (75 mg total) by mouth every 12 (twelve) hours. 10 capsule Lyndee Hensen, DO      PDMP not reviewed this encounter.   Lyndee Hensen, DO 01/01/23 1706

## 2023-01-01 NOTE — Discharge Instructions (Addendum)
You have influenza B.  Staff at the pharmacy to pick up your medications of any were prescribed to you.   You can take Tylenol and/or Ibuprofen as needed for fever reduction and pain relief.    For cough: honey 1/2 to 1 teaspoon (you can dilute the honey in water or another fluid).  You can also use guaifenesin and dextromethorphan for cough. You can use a humidifier for chest congestion and cough.  If you don't have a humidifier, you can sit in the bathroom with the hot shower running.      For sore throat: try warm salt water gargles, Mucinex sore throat cough drops or cepacol lozenges, throat spray, warm tea or water with lemon/honey, popsicles or ice, or OTC cold relief medicine for throat discomfort. You can also purchase chloraseptic spray at the pharmacy or dollar store.   For congestion: take a daily anti-histamine like Zyrtec, Claritin, and a oral decongestant, such as pseudoephedrine.  You can also use Flonase 1-2 sprays in each nostril daily. Afrin is also a good option, if you do not have high blood pressure.    It is important to stay hydrated: drink plenty of fluids (water, gatorade/powerade/pedialyte, juices, or teas) to keep your throat moisturized and help further relieve irritation/discomfort.    Return or go to the Emergency Department if symptoms worsen or do not improve in the next few days

## 2023-01-01 NOTE — ED Triage Notes (Signed)
Patient c/o body aches, cough onset yesterday

## 2023-01-25 ENCOUNTER — Ambulatory Visit (LOCAL_COMMUNITY_HEALTH_CENTER): Payer: Medicaid Other | Admitting: Family

## 2023-01-25 ENCOUNTER — Encounter: Payer: Self-pay | Admitting: Family

## 2023-01-25 VITALS — BP 126/80 | HR 89 | Ht 64.0 in | Wt 183.0 lb

## 2023-01-25 DIAGNOSIS — B3731 Acute candidiasis of vulva and vagina: Secondary | ICD-10-CM

## 2023-01-25 DIAGNOSIS — Z3042 Encounter for surveillance of injectable contraceptive: Secondary | ICD-10-CM

## 2023-01-25 DIAGNOSIS — Z01419 Encounter for gynecological examination (general) (routine) without abnormal findings: Secondary | ICD-10-CM | POA: Diagnosis not present

## 2023-01-25 DIAGNOSIS — Z309 Encounter for contraceptive management, unspecified: Secondary | ICD-10-CM | POA: Diagnosis not present

## 2023-01-25 LAB — WET PREP FOR TRICH, YEAST, CLUE: Trichomonas Exam: NEGATIVE

## 2023-01-25 MED ORDER — CLOTRIMAZOLE 1 % VA CREA: 1.0000 | TOPICAL_CREAM | Freq: Every day | VAGINAL | Status: DC

## 2023-01-25 MED ORDER — CLOTRIMAZOLE 1 % VA CREA: 1.0000 | TOPICAL_CREAM | Freq: Every day | VAGINAL | 0 refills | Status: AC

## 2023-01-25 NOTE — Addendum Note (Signed)
Addended by: Windle Guard on: 01/25/2023 04:04 PM   Modules accepted: Orders

## 2023-01-25 NOTE — Progress Notes (Signed)
Pt is here for PE and STD testing.  Wet mount results reviewed.  The patient was dispensed Clotrimazole 1% vaginal cream #1 today. I provided counseling today regarding the medication. We discussed the medication, the side effects and when to call clinic. Patient given the opportunity to ask questions. Questions answered.  Windle Guard, RN

## 2023-01-25 NOTE — Progress Notes (Signed)
[facility] Number: [phone]  Family Planning Visit- Repeat Yearly Visit  Subjective:  Jody Perez is a 32 y.o. [phone]  being seen today for an annual wellness visit and to discuss contraception options.   The patient is currently using Hormonal Injection for pregnancy prevention. Patient does not want a pregnancy in the next year.   Reports looking for a method that provides High efficacy at preventing pregnancy   Patient has the following medical problems: has History of genital HSV; Recurrent vaginitis; Obesity BMI=29.9; Vapes nicotine containing substance; Migraine without aura and without status migrainosus, not intractable; Trichomonas infection12/17/19; Liposuction 08/2021; Incomplete miscarriage 01/01/22; Herniated disc, cervical; Iron deficiency anemia, unspecified; Hemorrhagic ovarian cyst; Leukocytosis; and Blurry vision on their problem list.  Chief Complaint  Patient presents with   Annual Exam    PE and STD check    Patient reports wanting to continue Depo Provera, last injection 12/25/2022. Needs examination and STI screening due to white runny vaginal discharge on and off for the last 3 months since starting daily low dose Doxycycline for acne. Explained how we could do a wet prep but not GC/CT because she has been on an antibiotic for 3+ weeks. Last PE 01/23/22.  Denies concerns with Depo Provera.  See flowsheet for other program required questions.   Body mass index is 31.41 kg/m. - Patient is eligible for diabetes screening based on BMI> 25 and age >35?  no HA1C ordered? no  Patient reports 1 of partners in last year. Desires STI screening?  Yes   Has patient been screened once for HCV in the past?  No  No results found for: "HCVAB"  Does the patient have current of drug use, have a partner with drug use, and/or has been incarcerated since last result? No  If yes-- Screen for HCV  through [facility]   Does the patient meet criteria for HBV testing? No  Criteria:  -Household, sexual or needle sharing contact with HBV -History of drug use -HIV positive -Those with known Hep C   Health Maintenance Due  Topic Date Due   COVID-19 Vaccine (1) Never done   Hepatitis C Screening  Never done   HPV VACCINES (3 - 3-dose series) 11/20/2018   INFLUENZA VACCINE  Never done    Review of Systems  All other systems reviewed and are negative.   The following portions of the patient's history were reviewed and updated as appropriate: allergies, current medications, past family history, past medical history, past social history, past surgical history and problem list. Problem list updated.  Objective:   Vitals:   01/25/23 1312  BP: 126/80  Pulse: 89  Weight: 183 lb (83 kg)  Height: '5\' 4"'$  (1.626 m)    Physical Exam Vitals and nursing note reviewed.  Constitutional:      Appearance: Normal appearance.  Cardiovascular:     Rate and Rhythm: Normal rate and regular rhythm.     Pulses: Normal pulses.     Heart sounds: Normal heart sounds.  Pulmonary:     Effort: Pulmonary effort is normal.     Breath sounds: Normal breath sounds.  Genitourinary:    General: Normal vulva.     Exam position: Lithotomy position.     Pubic Area: No rash.      Labia:        Right: No rash, tenderness or lesion.        Left: No  rash, tenderness or lesion.      Vagina: Vaginal discharge (small amount thick white discharge, pH<4.5) present. No erythema or tenderness.     Cervix: No discharge, friability or erythema.     Uterus: Normal.      Adnexa: Right adnexa normal and left adnexa normal.  Lymphadenopathy:     Lower Body: No right inguinal adenopathy. No left inguinal adenopathy.  Skin:    General: Skin is warm and dry.  Neurological:     Mental Status: She is alert and oriented to person, place, and time.     Assessment and Plan:  Jody Perez is a 32 y.o. female [phone]  presenting to the [facility] for an yearly wellness and contraception visit to continue Depo Provera  The patient will follow up in  2 months for surveillance.  The patient was told to call with any further questions, or with any concerns about this method of contraception.  Emphasized use of condoms 100% of the time for STI prevention.  Patient was assessed for need for ECP, not indicated  1. Well woman exam Brief Physical Examination performed with pelvic for partial STI screening GC/CT deferred due to current antibiotic use Pap due 03/31/2027  - WET PREP FOR [place], YEAST, CLUE  2. Surveillance for Depo-Provera contraception Depo Provera '150mg'$  IM q11-13 weeks, #1, 4 refills Continue Depo as prescribed RTC x 2 months for injection, due 03/12/2023  3. Vulvovaginal candidiasis Clotrimazole 1% Vaginal Cream- 1 app per vag qhs x 7 nights Abstain from sex for 10-14 days Do not use Boric Acid vaginal tablets unless diagnosed with BV Discussed perineal hygiene  - clotrimazole (GYNE-LOTRIMIN) vaginal cream 1 Applicatorful     Return in about 2 months (around 03/26/2023) for Depo injection.  Future Appointments  Date Time Provider [place]  04/18/2023  9:45 AM Ralene Bathe, MD [facility] None    Marline Backbone, FNP

## 2023-02-05 DIAGNOSIS — B009 Herpesviral infection, unspecified: Secondary | ICD-10-CM | POA: Insufficient documentation

## 2023-02-05 DIAGNOSIS — L709 Acne, unspecified: Secondary | ICD-10-CM | POA: Insufficient documentation

## 2023-02-08 ENCOUNTER — Telehealth: Payer: Medicaid Other | Admitting: Physician Assistant

## 2023-02-08 DIAGNOSIS — A6004 Herpesviral vulvovaginitis: Secondary | ICD-10-CM | POA: Diagnosis not present

## 2023-02-08 MED ORDER — VALACYCLOVIR HCL 500 MG PO TABS: 500.0000 mg | ORAL_TABLET | Freq: Two times a day (BID) | ORAL | 0 refills | Status: DC

## 2023-02-08 NOTE — Progress Notes (Signed)
Virtual Visit Consent   Jody Perez, you are scheduled for a virtual visit with a [facility] provider today. Just as with appointments in the office, your consent must be obtained to participate. Your consent will be active for this visit and any virtual visit you may have with one of our providers in the next 365 days. If you have a MyChart account, a copy of this consent can be sent to you electronically.  As this is a virtual visit, video technology does not allow for your provider to perform a traditional examination. This may limit your provider's ability to fully assess your condition. If your provider identifies any concerns that need to be evaluated in person or the need to arrange testing (such as labs, EKG, etc.), we will make arrangements to do so. Although advances in technology are sophisticated, we cannot ensure that it will always work on either your end or our end. If the connection with a video visit is poor, the visit may have to be switched to a telephone visit. With either a video or telephone visit, we are not always able to ensure that we have a secure connection.  By engaging in this virtual visit, you consent to the provision of healthcare and authorize for your insurance to be billed (if applicable) for the services provided during this visit. Depending on your insurance coverage, you may receive a charge related to this service.  I need to obtain your verbal consent now. Are you willing to proceed with your visit today? Jody Perez has provided verbal consent on 02/08/2023 for a virtual visit (video or telephone). Leeanne Rio, [place]  Date: 02/08/2023 2:10 PM  Virtual Visit via Video Note   I, Leeanne Rio, connected with  Jody Perez  ([id number], Dec 16, 1990) on 02/08/23 at  1:45 PM EDT by a video-enabled telemedicine application and verified that I am speaking with the correct person using two identifiers.  Location: Patient: Virtual Visit Location Patient:  Mobile Provider: Virtual Visit Location Provider: Home Office   I discussed the limitations of evaluation and management by telemedicine and the availability of in person appointments. The patient expressed understanding and agreed to proceed.    History of Present Illness: Jody Perez is a 32 y.o. who identifies as a female who was assigned female at birth, and is being seen today for outbreak of HSV over the past day. Notes increased stressors recently due to moving house. Notes increased skin sensitivity with 3 vesicular lesions noted in perineal region and on external vagina. Has had a few outbreaks in the past few months. Prior to [place], outbreaks were further spread apart.  Has upcoming appt with new provider.    HPI: HPI   Problems:  Patient Active Problem List   Diagnosis Date Noted   Blurry vision 02/28/2022   Herniated disc, cervical 02/25/2022   Iron deficiency anemia, unspecified 02/25/2022   Hemorrhagic ovarian cyst 02/25/2022   Leukocytosis 02/25/2022   Incomplete miscarriage 01/01/22 01/24/2022   Trichomonas infection12/17/19 01/23/2022   Liposuction 08/2021 01/23/2022   Migraine without aura and without status migrainosus, not intractable 01/11/2021   Vapes nicotine containing substance 07/30/2020   Obesity BMI=29.9 04/07/2020   Recurrent vaginitis 06/06/2019   History of genital HSV 01/28/2013    Allergies: No Known Allergies Medications:  Current Outpatient Medications:    valACYclovir (VALTREX) 500 MG tablet, Take 1 tablet (500 mg total) by mouth 2 (two) times daily., Disp: 6 tablet, Rfl: 0   Adapalene (DIFFERIN) 0.3 %  gel, Apply 1 application  topically at bedtime., Disp: 45 g, Rfl: 3   doxycycline (PERIOSTAT) 20 MG tablet, Take 1 tablet (20 mg total) by mouth daily. With food and plenty of fluid, Disp: 30 tablet, Rfl: 3  Observations/Objective: Patient is well-developed, well-nourished in no acute distress.  Resting comfortably at home.  Head is normocephalic,  atraumatic.  No labored breathing. Speech is clear and coherent with logical content.  Patient is alert and oriented at baseline.   Assessment and Plan: 1. Herpes simplex vulvovaginitis - valACYclovir (VALTREX) 500 MG tablet; Take 1 tablet (500 mg total) by mouth 2 (two) times daily.  Dispense: 6 tablet; Refill: 0  Repeat outbreak. Start Valtrex as directed. Supportive measures and OTC medications reviewed.   Follow Up Instructions: I discussed the assessment and treatment plan with the patient. The patient was provided an opportunity to ask questions and all were answered. The patient agreed with the plan and demonstrated an understanding of the instructions.  A copy of instructions were sent to the patient via MyChart unless otherwise noted below.   The patient was advised to call back or seek an in-person evaluation if the symptoms worsen or if the condition fails to improve as anticipated.  Time:  I spent 10 minutes with the patient via telehealth technology discussing the above problems/concerns.    Leeanne Rio, PA-C

## 2023-02-08 NOTE — Patient Instructions (Signed)
Jody Perez, thank you for joining Leeanne Rio, PA-C for today's virtual visit.  While this provider is not your primary care provider (PCP), if your PCP is located in our provider database this encounter information will be shared with them immediately following your visit.   [facility] account gives you access to today's visit and all your visits, tests, and labs performed at [facility] " click here if you don't have a [facility] account or go to [email or web address]  Consent: (Patient) Jody Perez provided verbal consent for this virtual visit at the beginning of the encounter.  Current Medications:  Current Outpatient Medications:    Adapalene (DIFFERIN) 0.3 % gel, Apply 1 application  topically at bedtime., Disp: 45 g, Rfl: 3   doxycycline (PERIOSTAT) 20 MG tablet, Take 1 tablet (20 mg total) by mouth daily. With food and plenty of fluid, Disp: 30 tablet, Rfl: 3   metroNIDAZOLE (FLAGYL) 500 MG tablet, Take 1 tablet (500 mg total) by mouth 2 (two) times daily. (Patient not taking: Reported on 01/25/2023), Disp: 14 tablet, Rfl: 0   oseltamivir (TAMIFLU) 75 MG capsule, Take 1 capsule (75 mg total) by mouth every 12 (twelve) hours. (Patient not taking: Reported on 01/25/2023), Disp: 10 capsule, Rfl: 0   valACYclovir (VALTREX) 1000 MG tablet, Take 1 tablet (1,000 mg total) by mouth daily. For 5 days For flare of HSV., Disp: 30 tablet, Rfl: 0   Medications ordered in this encounter:  No orders of the defined types were placed in this encounter.    *If you need refills on other medications prior to your next appointment, please contact your pharmacy*  Follow-Up: Call back or seek an in-person evaluation if the symptoms worsen or if the condition fails to improve as anticipated.  [facility] [phone]  Other Instructions Genital Herpes Genital herpes is a common sexually transmitted infection (STI) that is caused by a virus.  The virus spreads from person to person through contact with a sore, infected saliva, or infected skin. The virus can cause itching, blisters, and sores around the genitals or rectum. During an outbreak of infection, symptoms may last for several days and then go away. However, the virus remains in the body, so more outbreaks may happen in the future. The time between outbreaks varies and can be from months to years. Genital herpes can affect anyone. It is particularly concerning for pregnant women because the virus can be passed to the baby during delivery. Genital herpes is also a concern for people who have a weak disease-fighting system (immune system). What are the causes? This condition is caused by the herpes simplex virus, type 1 or type 2 (HSV-1 or HSV-2). The virus may spread through: Sexual contact with an infected person, including vaginal, anal, and oral sex. Contact with a herpes sore. The skin. This means that you can get herpes from an infected partner even if there are no blisters or sores present. Your partner may not know that he or she is infected. What increases the risk? You are more likely to develop this condition if: You have sex with many partners. You do not use latex or polyurethane condoms during sex. What are the signs or symptoms? Most people do not have symptoms or they have mild symptoms that may be mistaken for other skin problems. Symptoms may include: Small, red bumps near the genitals, rectum, or mouth. These bumps turn into blisters and then sores. Flu-like (influenza-like) symptoms, including: Fever.  Body aches. Swollen lymph nodes. Headache. Painful urination. Pain and itching in the genital area or rectal area. Vaginal discharge. Tingling or shooting pain in the legs and buttocks. Generally, symptoms are more severe and last longer during the first (primary) outbreak. Influenza-like symptoms are also more common during the primary outbreak. How is this  diagnosed? This condition may be diagnosed based on: A physical exam. Your medical history. Blood tests. A test of a fluid sample (culture) from an open sore. How is this treated? There is no cure for this condition, but treatment with antiviral medicines can do the following: Speed up healing and relieve symptoms. Help to reduce the spread of the virus to sexual partners. Limit the chance of future outbreaks, or make future outbreaks shorter. Lessen symptoms of future outbreaks. Your health care provider may also recommend over-the-counter medicines to help with pain and itching. Follow these instructions at home: If you have an outbreak:  Keep the affected areas dry and clean. Avoid rubbing or touching blisters and sores. If you do touch blisters or sores: Wash your hands thoroughly with soap and water for at least 20 seconds. If soap and water are not available, use an alcohol-based hand sanitizer. Do not touch your eyes afterward. Sexual activity Do not have sexual contact during active outbreaks. Practice safe sex. Herpes can spread even if your partner does not have blisters or sores. Latex or polyurethane condoms and female condoms may help prevent the spread of the herpes virus. Managing pain and discomfort If directed, put ice on the painful area. To do this: Put ice in a plastic bag. Place a towel between your skin and the bag. Leave the ice on for 20 minutes, 2-3 times a day. Remove the ice if your skin turns bright red. This is very important. If you cannot feel pain, heat, or cold, you have a greater risk of damage to the area. If told, take a cool sitz bath to help relieve pain or itching. A sitz bath is a water bath that you take while sitting down in water that is deep enough to cover your hips and buttocks. General instructions Take over-the-counter and prescription medicines only as told by your health care provider. If you were prescribed an antiviral medicine, use  it as told by your health care provider. Do not stop using the antiviral even if you start to feel better. Keep all follow-up visits. This is important. How is this prevented? Use condoms. Although you can get genital herpes during sexual contact even with the use of a condom, a condom can provide some protection. Avoid having multiple sexual partners. Talk with your sexual partner about any symptoms either of you may have. Also, talk with your partner about any history of STIs. Do not have sexual contact if you have active symptoms of genital herpes. Contact a health care provider if: Your symptoms are not improving with medicine. Your symptoms return, or you have new symptoms. You have a fever. You have abdominal pain. You have redness, swelling, or pain in your eye. You notice new sores on other parts of your body. You have had herpes and you become pregnant or plan to become pregnant. Get help right away if: You have symptoms of viral meningitis. This is rare but may happen if the virus spreads to the brain. Symptoms may include: Severe headache or stiff neck. Muscle aches. Nausea and vomiting. Sensitivity to light. Summary Genital herpes is a common sexually transmitted infection (STI) that is  caused by the herpes simplex virus, type 1 or type 2 (HSV-1 or HSV-2). These viruses are most often spread through sexual contact with an infected person. You are more likely to develop this condition if you have sex with many partners or you do not use condoms during sex. Most people do not have symptoms or have mild symptoms that may be mistaken for other skin problems. Symptoms occur as outbreaks that may happen months or years apart. There is no cure for this condition, but treatment with oral antiviral medicines can reduce symptoms, reduce the chance of spreading the virus to a partner, prevent future outbreaks, or shorten future outbreaks. This information is not intended to replace advice  given to you by your health care provider. Make sure you discuss any questions you have with your health care provider. Document Revised: 08/18/2021 Document Reviewed: 08/18/2021 Elsevier Patient Education  [place].    If you have been instructed to have an in-person evaluation today at a local Urgent Care facility, please use the link below. It will take you to a list of all of our available [facility], including address, phone number and hours of operation. Please do not delay care.  [facility]  If you or a family member do not have a primary care provider, use the link below to schedule a visit and establish care. When you choose a [facility] primary care physician or advanced practice provider, you gain a long-term partner in health. Find a Primary Care Provider  Learn more about [facility]'s in-office and virtual care options: [facility]

## 2023-02-13 DIAGNOSIS — E559 Vitamin D deficiency, unspecified: Secondary | ICD-10-CM | POA: Insufficient documentation

## 2023-02-14 ENCOUNTER — Ambulatory Visit: Payer: Medicaid Other | Admitting: Dermatology

## 2023-02-15 NOTE — Addendum Note (Signed)
Addended by: Bradd Burner A on: 02/15/2023 02:32 PM   Modules accepted: Level of Service

## 2023-03-08 ENCOUNTER — Ambulatory Visit
Admission: EM | Admit: 2023-03-08 | Discharge: 2023-03-08 | Disposition: A | Discharge status: Home / Self Care | Payer: Medicaid Other | Attending: Family Medicine | Admitting: Family Medicine

## 2023-03-08 DIAGNOSIS — B379 Candidiasis, unspecified: Secondary | ICD-10-CM | POA: Insufficient documentation

## 2023-03-08 DIAGNOSIS — T3695XA Adverse effect of unspecified systemic antibiotic, initial encounter: Secondary | ICD-10-CM | POA: Diagnosis present

## 2023-03-08 DIAGNOSIS — B9689 Other specified bacterial agents as the cause of diseases classified elsewhere: Secondary | ICD-10-CM | POA: Diagnosis present

## 2023-03-08 DIAGNOSIS — Z113 Encounter for screening for infections with a predominantly sexual mode of transmission: Secondary | ICD-10-CM | POA: Diagnosis present

## 2023-03-08 DIAGNOSIS — N760 Acute vaginitis: Secondary | ICD-10-CM | POA: Diagnosis not present

## 2023-03-08 DIAGNOSIS — A6004 Herpesviral vulvovaginitis: Secondary | ICD-10-CM | POA: Insufficient documentation

## 2023-03-08 LAB — WET PREP, GENITAL
Clue Cells Wet Prep HPF POC: NONE SEEN
Sperm: NONE SEEN
Trich, Wet Prep: NONE SEEN
WBC, Wet Prep HPF POC: <10 (ref <10)
Yeast Wet Prep HPF POC: NONE SEEN

## 2023-03-08 MED ORDER — FLUCONAZOLE 150 MG PO TABS: 150.0000 mg | ORAL_TABLET | ORAL | 0 refills | Status: AC

## 2023-03-08 MED ORDER — VALACYCLOVIR HCL 500 MG PO TABS: 500.0000 mg | ORAL_TABLET | Freq: Two times a day (BID) | ORAL | 0 refills | Status: DC

## 2023-03-08 MED ORDER — METRONIDAZOLE 500 MG PO TABS: 500.0000 mg | ORAL_TABLET | Freq: Two times a day (BID) | ORAL | 0 refills | Status: DC

## 2023-03-08 NOTE — ED Triage Notes (Signed)
Pt c/o vaginal itching x2 days. Concerned about STD & wants full w/up since she has a dx of herpes.

## 2023-03-08 NOTE — ED Provider Notes (Signed)
[facility]    CSN: [id number] Arrival date & time: 03/08/23  0857      History   Chief Complaint Chief Complaint  Patient presents with   Vaginal Itching     HPI HPI Jody Perez is a 32 y.o. female.    Jody Perez presents for vaginal itching. Feels like she is having a herpes outbreak.. She has itching in the area and doesn't want a full outbreak to occur.   Tried nothing prior to arrival.  Has not had any antibiotics in last 30 days.   Denies known STI exposure. Jody Perez does not use condoms regularly. No LMP recorded. Patient has had an injection.  Requests STD testing.  - Contraception: Depo provera  - Abnormal vaginal discharge: no - vaginal bleeding: no - Dysuria: no - Hematuria: no - Urinary urgency: no - Urinary frequency: no  - Fever: no - Abdominal pain no - Pelvic pain: no - Rash/Skin lesions/mouth ulcers: yes - Nausea: no - Vomiting: no  - Back Pain: no        Past Medical History:  Diagnosis Date   BV (bacterial vaginosis)    Chlamydia    Herpes genitalis in women    HSV infection    Migraines    Miscarriage    Trichomonas    UTI (lower urinary tract infection)     Patient Active Problem List   Diagnosis Date Noted   Blurry vision 02/28/2022   Herniated disc, cervical 02/25/2022   Iron deficiency anemia, unspecified 02/25/2022   Hemorrhagic ovarian cyst 02/25/2022   Leukocytosis 02/25/2022   Incomplete miscarriage 01/01/22 01/24/2022   Trichomonas infection12/17/19 01/23/2022   Liposuction 08/2021 01/23/2022   Migraine without aura and without status migrainosus, not intractable 01/11/2021   Vapes nicotine containing substance 07/30/2020   Obesity BMI=29.9 04/07/2020   Recurrent vaginitis 06/06/2019   History of genital HSV 01/28/2013    Past Surgical History:  Procedure Laterality Date   LIPOSUCTION  09/01/2021   WISDOM TOOTH EXTRACTION      OB History     Gravida  6   Para  3   Term  3   Preterm  0   AB   3   Living  3      SAB  3   IAB  0   Ectopic  0   Multiple  0   Live Births  3            Home Medications    Prior to Admission medications   Medication Sig Start Date End Date Taking? Authorizing Provider  Adapalene (DIFFERIN) 0.3 % gel Apply 1 application  topically at bedtime. 11/02/22  Yes Deirdre Evener, MD  fluconazole (DIFLUCAN) 150 MG tablet Take 1 tablet (150 mg total) by mouth every 3 (three) days for 2 doses. 03/08/23 03/12/23 Yes Hania Cerone, DO  metroNIDAZOLE (FLAGYL) 500 MG tablet Take 1 tablet (500 mg total) by mouth 2 (two) times daily. 03/08/23  Yes Ronell Duffus, DO  valACYclovir (VALTREX) 500 MG tablet Take 1 tablet (500 mg total) by mouth 2 (two) times daily. 03/08/23   Katha Cabal, DO    Family History Family History  Problem Relation Age of Onset   Hypertension Mother    Cancer Mother        Pitutary   Healthy Father    Healthy Brother    Hypertension Brother    Cervical cancer Brother    Healthy Brother    Breast cancer Maternal  Grandmother    Colon cancer Neg Hx    Ovarian cancer Neg Hx     Social History Social History   Tobacco Use   Smoking status: Every Day    Types: E-cigarettes    Passive exposure: Never   Smokeless tobacco: Never  Vaping Use   Vaping Use: Every day   Substances: Nicotine, Flavoring  Substance Use Topics   Alcohol use: Not Currently    Comment: Socially   Drug use: Not Currently    Types: Marijuana    Comment: last use age 65     Allergies   Patient has no known allergies.   Review of Systems Review of Systems: :negative unless otherwise stated in HPI.      Physical Exam Triage Vital Signs ED Triage Vitals  Enc Vitals Group     BP 03/08/23 0905 130/89     Pulse Rate 03/08/23 0905 90     Resp 03/08/23 0905 16     Temp 03/08/23 0905 98.5 F (36.9 C)     Temp Source 03/08/23 0905 Oral     SpO2 03/08/23 0905 100 %     Weight 03/08/23 0909 186 lb (84.4 kg)     Height 03/08/23  0909 5\' 5"  (1.651 m)     Head Circumference --      Peak Flow --      Pain Score 03/08/23 0908 0     Pain Loc --      Pain Edu? --      Excl. in GC? --    No data found.  Updated Vital Signs BP 130/89 (BP Location: Left Arm)   Pulse 90   Temp 98.5 F (36.9 C) (Oral)   Resp 16   Ht 5\' 5"  (1.651 m)   Wt 84.4 kg   SpO2 100%   BMI 30.95 kg/m   Visual Acuity Right Eye Distance:   Left Eye Distance:   Bilateral Distance:    Right Eye Near:   Left Eye Near:    Bilateral Near:     Physical Exam GEN: well appearing female in no acute distress  CVS: well perfused, regular rate  RESP: speaking in full sentences without pause  ABD: soft, non-tender, non-distended, no palpable masses   GU: deferred, patient performed self swab    UC Treatments / Results  Labs (all labs ordered are listed, but only abnormal results are displayed) Labs Reviewed  WET PREP, GENITAL  CERVICOVAGINAL ANCILLARY ONLY    EKG   Radiology No results found.  Procedures Procedures (including critical care time)  Medications Ordered in UC Medications - No data to display  Initial Impression / Assessment and Plan / UC Course  I have reviewed the triage vital signs and the nursing notes.  Pertinent labs & imaging results that were available during my care of the patient were reviewed by me and considered in my medical decision making (see chart for details).      Patient is a 32 y.o.[name] female  who presents for vaginal itching.  Overall patient is well-appearing and afebrile.  Vital signs stable.  Wet prep concerning for acute bacterial vaginitis.  Patient has history of recurrent vaginitis and herpes.  She requests treatment for herpes to prevent a further outbreak. Gonorrhea, trichomonas and Chlamydia testing obtained.  Declined herpes and HIV.  HIV and RPR last performed in January 2023.  - Treatment: Valtrex 500 mg twice daily for 3 days and metronidazole 500 mg twice daily for 7  days. -  Diflucan for 2 doses for antibiotic associated yeast infection  - Abstain from coitus during course of treatment.  -  G/C Chlamydia and call in Rx if positive.    Return precautions including abdominal pain, fever, chills, nausea, or vomiting given. Discussed MDM, treatment plan and plan for follow-up with patient who agrees with plan.      Final Clinical Impressions(s) / UC Diagnoses   Final diagnoses:  Herpes simplex vulvovaginitis  Bacterial vaginitis  Antibiotic-induced yeast infection  Screening examination for STD (sexually transmitted disease)     Discharge Instructions      Stop by the pharmacy to pick up your prescriptions.  It is important that you use condoms regularly as the seminal fluid is likely throwing  off your vaginal pH.    Consider using boric acid suppositories as discussed.     ED Prescriptions     Medication Sig Dispense Auth. Provider   valACYclovir (VALTREX) 500 MG tablet Take 1 tablet (500 mg total) by mouth 2 (two) times daily. 6 tablet Livier Hendel, DO   metroNIDAZOLE (FLAGYL) 500 MG tablet Take 1 tablet (500 mg total) by mouth 2 (two) times daily. 14 tablet Luay Balding, DO   fluconazole (DIFLUCAN) 150 MG tablet Take 1 tablet (150 mg total) by mouth every 3 (three) days for 2 doses. 2 tablet Katha CabalBrimage, Sabri Teal, DO      PDMP not reviewed this encounter.   Katha CabalBrimage, Ona Rathert, DO 03/08/23 [id number]

## 2023-03-08 NOTE — Discharge Instructions (Addendum)
Stop by the pharmacy to pick up your prescriptions.  It is important that you use condoms regularly as the seminal fluid is likely throwing  off your vaginal pH.    Consider using boric acid suppositories as discussed.

## 2023-03-09 LAB — CERVICOVAGINAL ANCILLARY ONLY
Chlamydia: NEGATIVE
Comment: NEGATIVE
Comment: NEGATIVE
Comment: NORMAL
Neisseria Gonorrhea: NEGATIVE
Trichomonas: NEGATIVE

## 2023-03-12 ENCOUNTER — Ambulatory Visit: Payer: Medicaid Other

## 2023-04-05 ENCOUNTER — Ambulatory Visit (LOCAL_COMMUNITY_HEALTH_CENTER): Payer: Medicaid Other

## 2023-04-05 ENCOUNTER — Ambulatory Visit: Payer: Medicaid Other

## 2023-04-05 VITALS — BP 136/89 | Ht 64.0 in | Wt 191.0 lb

## 2023-04-05 DIAGNOSIS — Z3042 Encounter for surveillance of injectable contraceptive: Secondary | ICD-10-CM

## 2023-04-05 DIAGNOSIS — Z3009 Encounter for other general counseling and advice on contraception: Secondary | ICD-10-CM

## 2023-04-05 DIAGNOSIS — Z309 Encounter for contraceptive management, unspecified: Secondary | ICD-10-CM | POA: Diagnosis not present

## 2023-04-05 MED ORDER — MEDROXYPROGESTERONE ACETATE 150 MG/ML IM SUSP
150.0000 mg | Freq: Once | INTRAMUSCULAR | Status: AC
  Administered 2023-04-05: 150 mg via INTRAMUSCULAR

## 2023-04-05 NOTE — Progress Notes (Signed)
14 weeks 3 days post depo. Voices no concerns. RN counseled pt to adhere to 11 to 13 weeks between depo injections for optimal benefit.  Depo administered today per order by Aron Baba, FNP dated 01/25/2023. Patient had annual exam at that time. Depo documented in Epic as per SO Dr Ralene Bathe, as Provider K House, FNP no longer at [facility].  Tolerated depo well L delt. Next depo due 06/21/2023, pt aware. Questions answered and reports understanding. Jerel Shepherd, RN

## 2023-04-18 ENCOUNTER — Ambulatory Visit (INDEPENDENT_AMBULATORY_CARE_PROVIDER_SITE_OTHER): Payer: Medicaid Other | Admitting: Dermatology

## 2023-04-18 VITALS — BP 141/88

## 2023-04-18 DIAGNOSIS — Z7189 Other specified counseling: Secondary | ICD-10-CM | POA: Diagnosis not present

## 2023-04-18 DIAGNOSIS — L700 Acne vulgaris: Secondary | ICD-10-CM | POA: Diagnosis not present

## 2023-04-18 DIAGNOSIS — Z79899 Other long term (current) drug therapy: Secondary | ICD-10-CM | POA: Diagnosis not present

## 2023-04-18 MED ORDER — DOXYCYCLINE MONOHYDRATE 50 MG PO CAPS: 50.0000 mg | ORAL_CAPSULE | Freq: Every day | ORAL | 0 refills | Status: DC

## 2023-04-18 NOTE — Progress Notes (Signed)
Follow-Up Visit   Subjective  Jody Perez is a 32 y.o. female who presents for the following: Acne Vulgaris with hyperpigmentation, face, 73m f/u, Doxycycline 20mg  1 po qd, Adapalene 0.3% gel qhs, check spot nose comes and goes The patient has spots, moles and lesions to be evaluated, some may be new or changing and the patient may have concern these could be cancer.  The following portions of the chart were reviewed this encounter and updated as appropriate: medications, allergies, medical history  Review of Systems:  No other skin or systemic complaints except as noted in HPI or Assessment and Plan.  Objective  Well appearing patient in no apparent distress; mood and affect are within normal limits. Areas Examined: Face, chest and back Relevant exam findings are noted in the Assessment and Plan.  Assessment & Plan    ACNE VULGARIS Exam: Open comedones and inflammatory papules  Chronic and persistent condition with duration or expected duration over one year. Condition is symptomatic/ bothersome to patient. Not currently at goal.   Treatment Plan: D/c Doxycycline 20mg   Start Doxycycline 50mg  1 po qd with food and drink x 73m Cont Adapalene 0.3% gel qhs  Patient would like to start Isotretinoin  2 forms of Birth Control - Depo shot, Condoms IPLEDGE # [id number] Pt has been registered in [facility] program  Urine pregnancy test performed in office today and was negative.  Patient demonstrates comprehension and confirms she will not get pregnant.   [id number] exp 03/29/24  Isotretinoin Counseling; Review and Contraception Counseling: Reviewed potential side effects of isotretinoin including xerosis, cheilitis, hepatitis, hyperlipidemia, and severe birth defects if taken by a pregnant woman.  Women on isotretinoin must be celibate (not having sex) or required to use at least 2 birth control methods to prevent pregnancy (unless patient is a female of non-child bearing  potential).  Females of child-bearing potential must have monthly pregnancy tests while on isotretinoin and report through [employer] (FDA monitoring program). Reviewed reports of suicidal ideation in those with a history of depression while taking isotretinoin and reports of diagnosis of inflammatory bowl disease (IBD) while taking isotretinoin as well as the lack of evidence for a causal relationship between isotretinoin, depression and IBD. Patient advised to reach out with any questions or concerns. Patient advised not to share pills or donate blood while on treatment or for one month after completing treatment. All patient's considering Isotretinoin must read and understand and sign Isotretinoin Consent Form and be registered with [employer].   Doxycycline should be taken with food to prevent nausea. Do not lay down for 30 minutes after taking. Be cautious with sun exposure and use good sun protection while on this medication. Pregnant women should not take this medication.    Topical retinoid medications like tretinoin/Retin-A, adapalene/Differin, tazarotene/Fabior, and Epiduo/Epiduo Forte can cause dryness and irritation when first started. Only apply a pea-sized amount to the entire affected area. Avoid applying it around the eyes, edges of mouth and creases at the nose. If you experience irritation, use a good moisturizer first and/or apply the medicine less often. If you are doing well with the medicine, you can increase how often you use it until you are applying every night. Be careful with sun protection while using this medication as it can make you sensitive to the sun. This medicine should not be used by pregnant women.    Return in about 1 month (around 05/19/2023) for Iso start.  I, Ardis Rowan, RMA, am acting as scribe for  Armida Sans, MD .  Documentation: I have reviewed the above documentation for accuracy and completeness, and I agree with the above.  Armida Sans, MD

## 2023-04-18 NOTE — Patient Instructions (Signed)
Due to recent changes in healthcare laws, you may see results of your pathology and/or laboratory studies on MyChart before the doctors have had a chance to review them. We understand that in some cases there may be results that are confusing or concerning to you. Please understand that not all results are received at the same time and often the doctors may need to interpret multiple results in order to provide you with the best plan of care or course of treatment. Therefore, we ask that you please give us 2 business days to thoroughly review all your results before contacting the office for clarification. Should we see a critical lab result, you will be contacted sooner.   If You Need Anything After Your Visit  If you have any questions or concerns for your doctor, please call our main line at [phone] and press option 4 to reach your doctor's medical assistant. If no one answers, please leave a voicemail as directed and we will return your call as soon as possible. Messages left after 4 pm will be answered the following business day.   You may also send us a message via MyChart. We typically respond to MyChart messages within 1-2 business days.  For prescription refills, please ask your pharmacy to contact our office. Our fax number is [phone].  If you have an urgent issue when the clinic is closed that cannot wait until the next business day, you can page your doctor at the number below.    Please note that while we do our best to be available for urgent issues outside of office hours, we are not available 24/7.   If you have an urgent issue and are unable to reach us, you may choose to seek medical care at your doctor's office, retail clinic, urgent care center, or emergency room.  If you have a medical emergency, please immediately call 911 or go to the emergency department.  Pager Numbers  - Dr. Kowalski: [phone]  - Dr. Moye: [phone]  - Dr. Stewart:  [phone]  In the event of inclement weather, please call our main line at [phone] for an update on the status of any delays or closures.  Dermatology Medication Tips: Please keep the boxes that topical medications come in in order to help keep track of the instructions about where and how to use these. Pharmacies typically print the medication instructions only on the boxes and not directly on the medication tubes.   If your medication is too expensive, please contact our office at [phone] option 4 or send us a message through MyChart.   We are unable to tell what your co-pay for medications will be in advance as this is different depending on your insurance coverage. However, we may be able to find a substitute medication at lower cost or fill out paperwork to get insurance to cover a needed medication.   If a prior authorization is required to get your medication covered by your insurance company, please allow us 1-2 business days to complete this process.  Drug prices often vary depending on where the prescription is filled and some pharmacies may offer cheaper prices.  The website www.goodrx.com contains coupons for medications through different pharmacies. The prices here do not account for what the cost may be with help from insurance (it may be cheaper with your insurance), but the website can give you the price if you did not use any insurance.  - You can print the associated coupon and take it with   your prescription to the pharmacy.  - You may also stop by our office during regular business hours and pick up a GoodRx coupon card.  - If you need your prescription sent electronically to a different pharmacy, notify our office through [place] MyChart or by phone at [phone] option 4.     Si Usted Necesita Algo Despus de Su Visita  Tambin puede enviarnos un mensaje a travs de MyChart. Por lo general respondemos a los mensajes de MyChart en el transcurso de 1 a 2  das hbiles.  Para renovar recetas, por favor pida a su farmacia que se ponga en contacto con nuestra oficina. Nuestro nmero de fax es el [phone].  Si tiene un asunto urgente cuando la clnica est cerrada y que no puede esperar hasta el siguiente da hbil, puede llamar/localizar a su doctor(a) al nmero que aparece a continuacin.   Por favor, tenga en cuenta que aunque hacemos todo lo posible para estar disponibles para asuntos urgentes fuera del horario de oficina, no estamos disponibles las 24 horas del da, los 7 das de la semana.   Si tiene un problema urgente y no puede comunicarse con nosotros, puede optar por buscar atencin mdica  en el consultorio de su doctor(a), en una clnica privada, en un centro de atencin urgente o en una sala de emergencias.  Si tiene una emergencia mdica, por favor llame inmediatamente al 911 o vaya a la sala de emergencias.  Nmeros de bper  - Dr. Kowalski: [phone]  - Dra. Moye: [phone]  - Dra. Stewart: [phone]  En caso de inclemencias del tiempo, por favor llame a nuestra lnea principal al [phone] para una actualizacin sobre el estado de cualquier retraso o cierre.  Consejos para la medicacin en dermatologa: Por favor, guarde las cajas en las que vienen los medicamentos de uso tpico para ayudarle a seguir las instrucciones sobre dnde y cmo usarlos. Las farmacias generalmente imprimen las instrucciones del medicamento slo en las cajas y no directamente en los tubos del medicamento.   Si su medicamento es muy caro, por favor, pngase en contacto con nuestra oficina llamando al [phone] y presione la opcin 4 o envenos un mensaje a travs de MyChart.   No podemos decirle cul ser su copago por los medicamentos por adelantado ya que esto es diferente dependiendo de la cobertura de su seguro. Sin embargo, es posible que podamos encontrar un medicamento sustituto a menor costo o llenar un formulario para que el  seguro cubra el medicamento que se considera necesario.   Si se requiere una autorizacin previa para que su compaa de seguros cubra su medicamento, por favor permtanos de 1 a 2 das hbiles para completar este proceso.  Los precios de los medicamentos varan con frecuencia dependiendo del lugar de dnde se surte la receta y alguna farmacias pueden ofrecer precios ms baratos.  El sitio web www.goodrx.com tiene cupones para medicamentos de diferentes farmacias. Los precios aqu no tienen en cuenta lo que podra costar con la ayuda del seguro (puede ser ms barato con su seguro), pero el sitio web puede darle el precio si no utiliz ningn seguro.  - Puede imprimir el cupn correspondiente y llevarlo con su receta a la farmacia.  - Tambin puede pasar por nuestra oficina durante el horario de atencin regular y recoger una tarjeta de cupones de GoodRx.  - Si necesita que su receta se enve electrnicamente a una farmacia diferente, informe a nuestra oficina a travs de MyChart de [place]   o por telfono llamando al [phone] y presione la opcin 4.  

## 2023-05-01 ENCOUNTER — Encounter: Payer: Self-pay | Admitting: Dermatology

## 2023-05-17 ENCOUNTER — Ambulatory Visit (INDEPENDENT_AMBULATORY_CARE_PROVIDER_SITE_OTHER): Payer: Medicaid Other | Admitting: Dermatology

## 2023-05-17 VITALS — BP 139/86

## 2023-05-17 DIAGNOSIS — Z79899 Other long term (current) drug therapy: Secondary | ICD-10-CM | POA: Diagnosis not present

## 2023-05-17 DIAGNOSIS — L700 Acne vulgaris: Secondary | ICD-10-CM | POA: Diagnosis not present

## 2023-05-17 DIAGNOSIS — Z7189 Other specified counseling: Secondary | ICD-10-CM

## 2023-05-17 NOTE — Patient Instructions (Signed)
Due to recent changes in healthcare laws, you may see results of your pathology and/or laboratory studies on MyChart before the doctors have had a chance to review them. We understand that in some cases there may be results that are confusing or concerning to you. Please understand that not all results are received at the same time and often the doctors may need to interpret multiple results in order to provide you with the best plan of care or course of treatment. Therefore, we ask that you please give us 2 business days to thoroughly review all your results before contacting the office for clarification. Should we see a critical lab result, you will be contacted sooner.   If You Need Anything After Your Visit  If you have any questions or concerns for your doctor, please call our main line at [phone] and press option 4 to reach your doctor's medical assistant. If no one answers, please leave a voicemail as directed and we will return your call as soon as possible. Messages left after 4 pm will be answered the following business day.   You may also send us a message via MyChart. We typically respond to MyChart messages within 1-2 business days.  For prescription refills, please ask your pharmacy to contact our office. Our fax number is [phone].  If you have an urgent issue when the clinic is closed that cannot wait until the next business day, you can page your doctor at the number below.    Please note that while we do our best to be available for urgent issues outside of office hours, we are not available 24/7.   If you have an urgent issue and are unable to reach us, you may choose to seek medical care at your doctor's office, retail clinic, urgent care center, or emergency room.  If you have a medical emergency, please immediately call 911 or go to the emergency department.  Pager Numbers  - Dr. Kowalski: [phone]  - Dr. Moye: [phone]  - Dr. Stewart:  [phone]  In the event of inclement weather, please call our main line at [phone] for an update on the status of any delays or closures.  Dermatology Medication Tips: Please keep the boxes that topical medications come in in order to help keep track of the instructions about where and how to use these. Pharmacies typically print the medication instructions only on the boxes and not directly on the medication tubes.   If your medication is too expensive, please contact our office at [phone] option 4 or send us a message through MyChart.   We are unable to tell what your co-pay for medications will be in advance as this is different depending on your insurance coverage. However, we may be able to find a substitute medication at lower cost or fill out paperwork to get insurance to cover a needed medication.   If a prior authorization is required to get your medication covered by your insurance company, please allow us 1-2 business days to complete this process.  Drug prices often vary depending on where the prescription is filled and some pharmacies may offer cheaper prices.  The website www.goodrx.com contains coupons for medications through different pharmacies. The prices here do not account for what the cost may be with help from insurance (it may be cheaper with your insurance), but the website can give you the price if you did not use any insurance.  - You can print the associated coupon and take it with   your prescription to the pharmacy.  - You may also stop by our office during regular business hours and pick up a GoodRx coupon card.  - If you need your prescription sent electronically to a different pharmacy, notify our office through [place] MyChart or by phone at [phone] option 4.     Si Usted Necesita Algo Despus de Su Visita  Tambin puede enviarnos un mensaje a travs de MyChart. Por lo general respondemos a los mensajes de MyChart en el transcurso de 1 a 2  das hbiles.  Para renovar recetas, por favor pida a su farmacia que se ponga en contacto con nuestra oficina. Nuestro nmero de fax es el [phone].  Si tiene un asunto urgente cuando la clnica est cerrada y que no puede esperar hasta el siguiente da hbil, puede llamar/localizar a su doctor(a) al nmero que aparece a continuacin.   Por favor, tenga en cuenta que aunque hacemos todo lo posible para estar disponibles para asuntos urgentes fuera del horario de oficina, no estamos disponibles las 24 horas del da, los 7 das de la semana.   Si tiene un problema urgente y no puede comunicarse con nosotros, puede optar por buscar atencin mdica  en el consultorio de su doctor(a), en una clnica privada, en un centro de atencin urgente o en una sala de emergencias.  Si tiene una emergencia mdica, por favor llame inmediatamente al 911 o vaya a la sala de emergencias.  Nmeros de bper  - Dr. Kowalski: [phone]  - Dra. Moye: [phone]  - Dra. Stewart: [phone]  En caso de inclemencias del tiempo, por favor llame a nuestra lnea principal al [phone] para una actualizacin sobre el estado de cualquier retraso o cierre.  Consejos para la medicacin en dermatologa: Por favor, guarde las cajas en las que vienen los medicamentos de uso tpico para ayudarle a seguir las instrucciones sobre dnde y cmo usarlos. Las farmacias generalmente imprimen las instrucciones del medicamento slo en las cajas y no directamente en los tubos del medicamento.   Si su medicamento es muy caro, por favor, pngase en contacto con nuestra oficina llamando al [phone] y presione la opcin 4 o envenos un mensaje a travs de MyChart.   No podemos decirle cul ser su copago por los medicamentos por adelantado ya que esto es diferente dependiendo de la cobertura de su seguro. Sin embargo, es posible que podamos encontrar un medicamento sustituto a menor costo o llenar un formulario para que el  seguro cubra el medicamento que se considera necesario.   Si se requiere una autorizacin previa para que su compaa de seguros cubra su medicamento, por favor permtanos de 1 a 2 das hbiles para completar este proceso.  Los precios de los medicamentos varan con frecuencia dependiendo del lugar de dnde se surte la receta y alguna farmacias pueden ofrecer precios ms baratos.  El sitio web www.goodrx.com tiene cupones para medicamentos de diferentes farmacias. Los precios aqu no tienen en cuenta lo que podra costar con la ayuda del seguro (puede ser ms barato con su seguro), pero el sitio web puede darle el precio si no utiliz ningn seguro.  - Puede imprimir el cupn correspondiente y llevarlo con su receta a la farmacia.  - Tambin puede pasar por nuestra oficina durante el horario de atencin regular y recoger una tarjeta de cupones de GoodRx.  - Si necesita que su receta se enve electrnicamente a una farmacia diferente, informe a nuestra oficina a travs de MyChart de    o por telfono llamando al [phone] y presione la opcin 4.  

## 2023-05-17 NOTE — Progress Notes (Signed)
   Follow-Up Visit   Subjective  Jody Perez is a 32 y.o. female who presents for the following: Acne Vulgaris, face, pt to start Isotretinoin, taking Doxycycline 50mg  1 po every day, Adapalene gel 0.1% qhs   The following portions of the chart were reviewed this encounter and updated as appropriate: medications, allergies, medical history  Review of Systems:  No other skin or systemic complaints except as noted in HPI or Assessment and Plan.  Objective  Well appearing patient in no apparent distress; mood and affect are within normal limits.  Areas Examined: Face, chest and back  Relevant exam findings are noted in the Assessment and Plan.   Assessment & Plan    ACNE VULGARIS Exam: spotty hyperpigmentation, open comedones and inflammatory papules      Treatment Plan: Plan to start Isotretinoin  2 forms of BC Depo shot, condoms Pharmacy [facility] # [id number] Labs from 02/05/23 viewed  Pending pregnancy test start Isotretinoin 20mg  1 po every day with fatty meal, pt will send a mychart message with pregnancy test results  Pt advised to d/c Doxycyline, adapalene gel  Isotretinoin Counseling; Review and Contraception Counseling: Reviewed potential side effects of isotretinoin including xerosis, cheilitis, hepatitis, hyperlipidemia, and severe birth defects if taken by a pregnant woman.  Women on isotretinoin must be celibate (not having sex) or required to use at least 2 birth control methods to prevent pregnancy (unless patient is a female of non-child bearing potential).  Females of child-bearing potential must have monthly pregnancy tests while on isotretinoin and report through [employer] (FDA monitoring program). Reviewed reports of suicidal ideation in those with a history of depression while taking isotretinoin and reports of diagnosis of inflammatory bowl disease (IBD) while taking isotretinoin as well as the lack of evidence for a causal relationship  between isotretinoin, depression and IBD. Patient advised to reach out with any questions or concerns. Patient advised not to share pills or donate blood while on treatment or for one month after completing treatment. All patient's considering Isotretinoin must read and understand and sign Isotretinoin Consent Form and be registered with [employer].   Return in about 1 month (around 06/16/2023) for Isotretinoin f/u.  I, Ardis Rowan, RMA, am acting as scribe for Armida Sans, MD .   Documentation: I have reviewed the above documentation for accuracy and completeness, and I agree with the above.  Armida Sans, MD

## 2023-05-20 ENCOUNTER — Encounter: Payer: Self-pay | Admitting: Dermatology

## 2023-05-21 ENCOUNTER — Encounter: Payer: Self-pay | Admitting: Dermatology

## 2023-05-21 MED ORDER — ISOTRETINOIN 20 MG PO CAPS: ORAL_CAPSULE | ORAL | 0 refills | Status: DC

## 2023-05-28 ENCOUNTER — Ambulatory Visit
Admission: EM | Admit: 2023-05-28 | Discharge: 2023-05-28 | Disposition: A | Discharge status: Home / Self Care | Payer: Medicaid Other | Attending: Emergency Medicine | Admitting: Emergency Medicine

## 2023-05-28 DIAGNOSIS — M545 Low back pain, unspecified: Secondary | ICD-10-CM

## 2023-05-28 DIAGNOSIS — M546 Pain in thoracic spine: Secondary | ICD-10-CM | POA: Diagnosis not present

## 2023-05-28 MED ORDER — CYCLOBENZAPRINE HCL 10 MG PO TABS: 10.0000 mg | ORAL_TABLET | Freq: Two times a day (BID) | ORAL | 0 refills | Status: DC | PRN

## 2023-05-28 MED ORDER — PREDNISONE 20 MG PO TABS: 40.0000 mg | ORAL_TABLET | Freq: Every day | ORAL | 0 refills | Status: DC
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
## 2023-05-28 NOTE — Discharge Instructions (Addendum)
Your pain is most likely caused by irritation to the muscles.  Starting tomorrow take prednisone every morning with food for 5 days to reduce inflammation and help with back pain, may take Tylenol in addition to this  May use muscle relaxant twice daily as needed for additional comfort   You may use heating pad in 15 minute intervals as needed for additional comfort, within the first 2-3 days you may find comfort in using ice in 10-15 minutes over affected area  Begin stretching affected area daily for 10 minutes as tolerated to further loosen muscles   When lying down place pillow underneath and between knees for support  Can try sleeping without pillow on firm mattress   Practice good posture: head back, shoulders back, chest forward, pelvis back and weight distributed evenly on both legs  If pain persist after recommended treatment or reoccurs if may be beneficial to follow up with orthopedic specialist for evaluation, this doctor specializes in the bones and can manage your symptoms long-term with options such as but not limited to imaging, medications or physical therapy

## 2023-06-19 ENCOUNTER — Encounter: Payer: Self-pay | Admitting: Dermatology

## 2023-06-19 ENCOUNTER — Ambulatory Visit (INDEPENDENT_AMBULATORY_CARE_PROVIDER_SITE_OTHER): Payer: Medicaid Other | Admitting: Dermatology

## 2023-06-19 VITALS — BP 128/82 | Wt 191.0 lb

## 2023-06-19 DIAGNOSIS — L905 Scar conditions and fibrosis of skin: Secondary | ICD-10-CM | POA: Diagnosis not present

## 2023-06-19 DIAGNOSIS — L853 Xerosis cutis: Secondary | ICD-10-CM | POA: Diagnosis not present

## 2023-06-19 DIAGNOSIS — L819 Disorder of pigmentation, unspecified: Secondary | ICD-10-CM

## 2023-06-19 DIAGNOSIS — Z79899 Other long term (current) drug therapy: Secondary | ICD-10-CM

## 2023-06-19 DIAGNOSIS — L700 Acne vulgaris: Secondary | ICD-10-CM | POA: Diagnosis not present

## 2023-06-19 DIAGNOSIS — K130 Diseases of lips: Secondary | ICD-10-CM

## 2023-06-19 MED ORDER — ISOTRETINOIN 30 MG PO CAPS: 30.0000 mg | ORAL_CAPSULE | Freq: Every day | ORAL | 0 refills | Status: DC

## 2023-06-19 NOTE — Progress Notes (Signed)
   Isotretinoin Follow-Up Visit   Subjective  Jody Perez is a 32 y.o. female who presents for the following: Isotretinoin follow-up  Week # 4 Pharmacy [facility] [place] [facility] # [id number]  Total mg -  600mg  Total mg/kg - 7mg /kg Birth Control- Depo shot, condoms   Isotretinoin F/U - 06/19/23 0700       Isotretinoin Follow Up   iPledge # [id number]    Date 06/19/23    Weight 191 lb (86.6 kg)    Two Forms of Birth Control Injection;Female Condom    Acne breakouts since last visit? Yes      Dosage   Target Dosage (mg) 12990    Current (To Date) Dosage (mg) 600    To Go Dosage (mg) 12390      Side Effects   Skin Chapped Lips;Dry Lips;Dry Skin    Gastrointestinal WNL    Neurological WNL   No mood changes, no depression, no homicidal thoughts, no suicidal thoughts   Constitutional WNL             Side effects: Dry skin, dry lips  Patient is not pregnant, not seeking pregnancy, and not breastfeeding.  The following portions of the chart were reviewed this encounter and updated as appropriate: medications, allergies, medical history  Review of Systems:  No other skin or systemic complaints except as noted in HPI or Assessment and Plan.  Objective  Well appearing patient in no apparent distress; mood and affect are within normal limits.  An examination of the face, neck, chest, and back was performed and relevant findings are noted below.    Assessment & Plan    ACNE VULGARIS Patient is currently on Isotretinoin requiring FDA mandated monthly evaluations and laboratory monitoring. Condition is currently not to goal (must reach target dose based on weight and also have clear skin for 2 months prior to discontinuation in order to help prevent relapse)  Exam findings: Face with few active paps, mild to moderate scarring, spotty hyperpigmentation  Continue isotretinoin increase to Isotretinoin 30mg  1 po qd with fatty meal  Urine pregnancy test performed in office  today and was negative.  Patient demonstrates comprehension and confirms she will not get pregnant. Lot [id number], ecp 10/11/2024  Patient confirmed in iPledge and isotretinoin sent to pharmacy.   Xerosis secondary to isotretinoin therapy - Continue emollients as directed - Xyzal (levocetirizine) once a day and fish oil 1 gram daily may also help with dryness  Cheilitis secondary to isotretinoin therapy - Continue lip balm as directed, Dr. Clayborne Artist Cortibalm recommended  Long term medication management (isotretinoin)  Patient is using long term (months to years) prescription medication  to control their dermatologic condition.  These medications require periodic monitoring to evaluate for efficacy and side effects and may require periodic laboratory monitoring.  - While taking Isotretinoin and for 30 days after you finish the medication, do not get pregnant, do not share pills, do not donate blood. Isotretinoin is best absorbed when taken with a fatty meal. Isotretinoin can make you sensitive to the sun. Daily careful sun protection including sunscreen SPF 30+ when outdoors is recommended.  Follow-up in 30 days.  I, Ardis Rowan, RMA, am acting as scribe for Armida Sans, MD .  Documentation: I have reviewed the above documentation for accuracy and completeness, and I agree with the above.  Armida Sans, MD

## 2023-06-19 NOTE — Patient Instructions (Signed)

## 2023-06-21 ENCOUNTER — Other Ambulatory Visit: Payer: Self-pay | Admitting: Family Medicine

## 2023-06-21 ENCOUNTER — Ambulatory Visit: Payer: Medicaid Other

## 2023-06-21 VITALS — BP 125/85 | Ht 64.0 in | Wt 186.5 lb

## 2023-06-21 DIAGNOSIS — Z3009 Encounter for other general counseling and advice on contraception: Secondary | ICD-10-CM

## 2023-06-21 DIAGNOSIS — Z3042 Encounter for surveillance of injectable contraceptive: Secondary | ICD-10-CM

## 2023-06-21 DIAGNOSIS — Z30013 Encounter for initial prescription of injectable contraceptive: Secondary | ICD-10-CM | POA: Diagnosis not present

## 2023-06-21 DIAGNOSIS — Z309 Encounter for contraceptive management, unspecified: Secondary | ICD-10-CM | POA: Diagnosis not present

## 2023-06-21 MED ORDER — MEDROXYPROGESTERONE ACETATE 150 MG/ML IM SUSP
150.0000 mg | INTRAMUSCULAR | Status: AC
  Administered 2023-06-21 – 2024-03-07 (×4): 150 mg via INTRAMUSCULAR

## 2023-06-21 NOTE — Progress Notes (Signed)
11w 0d post depo. Voices no concerns. Depo given today per order by Aliene Altes, FNP dated 06/21/2023. Tolerated well in R deltoid. Next depo 09/06/2023; patient declines reminder card as she gets reminder from MyChart.  Abagail Kitchens, RN

## 2023-07-19 ENCOUNTER — Encounter: Payer: Self-pay | Admitting: Dermatology

## 2023-07-25 ENCOUNTER — Encounter: Payer: Self-pay | Admitting: Dermatology

## 2023-07-25 ENCOUNTER — Ambulatory Visit: Payer: Medicaid Other | Admitting: Dermatology

## 2023-07-25 VITALS — BP 124/81 | HR 100 | Wt 191.0 lb

## 2023-07-25 DIAGNOSIS — L700 Acne vulgaris: Secondary | ICD-10-CM

## 2023-07-25 DIAGNOSIS — Z79899 Other long term (current) drug therapy: Secondary | ICD-10-CM | POA: Diagnosis not present

## 2023-07-25 DIAGNOSIS — Z7189 Other specified counseling: Secondary | ICD-10-CM

## 2023-07-25 DIAGNOSIS — L853 Xerosis cutis: Secondary | ICD-10-CM | POA: Diagnosis not present

## 2023-07-25 DIAGNOSIS — D2239 Melanocytic nevi of other parts of face: Secondary | ICD-10-CM

## 2023-07-25 MED ORDER — ISOTRETINOIN 40 MG PO CAPS: 40.0000 mg | ORAL_CAPSULE | Freq: Every day | ORAL | 0 refills | Status: AC

## 2023-07-25 NOTE — Patient Instructions (Signed)
 While taking Isotretinoin and for 30 days after you finish the medication, do not get pregnant, do not share pills, do not donate blood.  Generic isotretinoin is best absorbed when taken with a fatty meal. Isotretinoin can make you sensitive to the sun. Daily careful sun protection including sunscreen SPF 30+ when outdoors is recommended.     Due to recent changes in healthcare laws, you may see results of your pathology and/or laboratory studies on MyChart before the doctors have had a chance to review them. We understand that in some cases there may be results that are confusing or concerning to you. Please understand that not all results are received at the same time and often the doctors may need to interpret multiple results in order to provide you with the best plan of care or course of treatment. Therefore, we ask that you please give [place] 2 business days to thoroughly review all your results before contacting the office for clarification. Should we see a critical lab result, you will be contacted sooner.   If You Need Anything After Your Visit  If you have any questions or concerns for your doctor, please call our main line at [phone] and press option 4 to reach your doctor's medical assistant. If no one answers, please leave a voicemail as directed and we will return your call as soon as possible. Messages left after 4 pm will be answered the following business day.   You may also send [place] a message via MyChart. We typically respond to MyChart messages within 1-2 business days.  For prescription refills, please ask your pharmacy to contact our office. Our fax number is [phone].  If you have an urgent issue when the clinic is closed that cannot wait until the next business day, you can page your doctor at the number below.    Please note that while we do our best to be available for urgent issues outside of office hours, we are not available 24/7.   If you have an urgent issue and  are unable to reach [place], you may choose to seek medical care at your doctor's office, retail clinic, urgent care center, or emergency room.  If you have a medical emergency, please immediately call 911 or go to the emergency department.  Pager Numbers  - Dr. Gwen Pounds: [phone]  - Dr. Roseanne Reno: [phone]  - Dr. Katrinka Blazing: [phone]   In the event of inclement weather, please call our main line at [phone] for an update on the status of any delays or closures.  Dermatology Medication Tips: Please keep the boxes that topical medications come in in order to help keep track of the instructions about where and how to use these. Pharmacies typically print the medication instructions only on the boxes and not directly on the medication tubes.   If your medication is too expensive, please contact our office at [phone] option 4 or send [place] a message through MyChart.   We are unable to tell what your co-pay for medications will be in advance as this is different depending on your insurance coverage. However, we may be able to find a substitute medication at lower cost or fill out paperwork to get insurance to cover a needed medication.   If a prior authorization is required to get your medication covered by your insurance company, please allow [place] 1-2 business days to complete this process.  Drug prices often vary depending on where the prescription is filled and some pharmacies may offer cheaper prices.  The website www.goodrx.com contains coupons for medications through different pharmacies. The prices here do not account for what the cost may be with help from insurance (it may be cheaper with your insurance), but the website can give you the price if you did not use any insurance.  - You can print the associated coupon and take it with your prescription to the pharmacy.  - You may also stop by our office during regular business hours and pick up a GoodRx coupon card.  - If you need  your prescription sent electronically to a different pharmacy, notify our office through [facility] or by phone at [phone] option 4.     Si Usted Necesita Algo Despus de Su Visita  Tambin puede enviarnos un mensaje a travs de Clinical cytogeneticist. Por lo general respondemos a los mensajes de MyChart en el transcurso de 1 a 2 das hbiles.  Para renovar recetas, por favor pida a su farmacia que se ponga en contacto con nuestra oficina. Annie Sable de fax es [place] [phone].  Si tiene un asunto urgente cuando la clnica est cerrada y que no puede esperar hasta el siguiente da hbil, puede llamar/localizar a su doctor(a) al nmero que aparece a continuacin.   Por favor, tenga en cuenta que aunque hacemos todo lo posible para estar disponibles para asuntos urgentes fuera del horario de [place], no estamos disponibles las 24 horas del da, los 7 [address]  [address] de la [place].   Si tiene un problema urgente y no puede comunicarse con nosotros, puede optar por buscar atencin mdica  en el consultorio de su doctor(a), en una clnica privada, en un centro de atencin urgente o en una sala de emergencias.  Si tiene Engineer, drilling, por favor llame inmediatamente al 911 o vaya a la sala de emergencias.  Nmeros de bper  - Dr. Gwen Pounds: [phone]  - Dra. Roseanne Reno: [phone]  - Dr. Katrinka Blazing: [phone]   En caso de inclemencias del tiempo, por favor llame a Lacy Duverney principal al [phone] para una actualizacin sobre el [place] de cualquier retraso o cierre.  Consejos para la medicacin en dermatologa: Por favor, guarde las cajas en las que vienen los medicamentos de uso tpico para ayudarle a seguir las instrucciones sobre dnde y cmo usarlos. Las farmacias generalmente imprimen las instrucciones del medicamento slo en las cajas y no directamente en los tubos del [place].   Si su medicamento es muy caro, por favor, pngase en contacto con Rolm Gala llamando al  [phone] y presione la opcin 4 o envenos un mensaje a travs de Clinical cytogeneticist.   No podemos decirle cul ser su copago por los medicamentos por adelantado ya que esto es diferente dependiendo de la cobertura de su seguro. Sin embargo, es posible que podamos encontrar un medicamento sustituto a Audiological scientist un formulario para que el seguro cubra el medicamento que se considera necesario.   Si se requiere una autorizacin previa para que su compaa de seguros [place] su medicamento, por favor permtanos de 1 a 2 das hbiles para completar [address]  Los precios de los medicamentos varan con frecuencia dependiendo del Environmental consultant de dnde se surte la receta y alguna farmacias pueden ofrecer precios ms baratos.  El sitio web www.goodrx.com tiene cupones para medicamentos de Health and safety inspector. Los precios aqu no tienen en cuenta lo que podra costar con la ayuda del seguro (puede ser ms barato con su seguro), pero el sitio web puede darle el precio si no utiliz Tourist information centre manager.  -  Puede imprimir el cupn correspondiente y llevarlo con su receta a la farmacia.  - Tambin puede pasar por nuestra oficina durante el horario de atencin regular y Education officer, museum una tarjeta de cupones de GoodRx.  - Si necesita que su receta se enve electrnicamente a una farmacia diferente, informe a nuestra oficina a travs de MyChart de [place] o por telfono llamando al [phone] y presione la opcin 4.

## 2023-07-25 NOTE — Progress Notes (Signed)
Isotretinoin Follow-Up Visit   Subjective  Jody Perez is a 32 y.o. female who presents for the following: Isotretinoin follow-up Had a few breakouts around mouth and cheeks. Reports some dryness on face, chapped lips, and few headaches.    Week # 8 Pharmacy [facility] [place] [facility] # [id number] Total mg -  1500 Total mg/kg - 17.32 Birth Control- Depo shot/ condoms     Isotretinoin F/U - 07/25/23 0800       Isotretinoin Follow Up   iPledge # [id number]    Date 07/25/23    Weight 191 lb (86.6 kg)    Two Forms of Birth Control Injection;Female Condom    Acne breakouts since last visit? Yes      Dosage   Target Dosage (mg) 12990    Current (To Date) Dosage (mg) 1500    To Go Dosage (mg) 11490      Side Effects   Skin Chapped Lips;Dry Lips;Dry Skin    Gastrointestinal WNL    Neurological Headache    Constitutional WNL              Side effects: Dry skin, dry lips  Patient is not pregnant, not seeking pregnancy, and not breastfeeding.   The following portions of the chart were reviewed this encounter and updated as appropriate: medications, allergies, medical history  Review of Systems:  No other skin or systemic complaints except as noted in HPI or Assessment and Plan.  Objective  Well appearing patient in no apparent distress; mood and affect are within normal limits.  An examination of the face, neck, chest, and back was performed and relevant findings are noted below.     Assessment & Plan     ACNE VULGARIS Patient is currently on Isotretinoin requiring FDA mandated monthly evaluations and laboratory monitoring. Condition is currently not to goal (must reach target dose based on weight and also have clear skin for 2 months prior to discontinuation in order to help prevent relapse)  Exam findings:Face with few active paps, mild to moderate scarring, spotty hyperpigmentation    Week # 8 Pharmacy [facility] [place] [facility] # [id number] Total mg -   1500 Total mg/kg - 17.32 Birth Control- Depo shot/female condoms  Increase isotretinoin 30 mg to 40 mg po qd   Urine pregnancy test performed in office today and was negative.  Patient demonstrates comprehension and confirms she will not get pregnant.   Patient confirmed in iPledge and isotretinoin sent to pharmacy.    Xerosis secondary to isotretinoin therapy - Continue emollients as directed - Xyzal (levocetirizine) once a day and fish oil 1 gram daily may also help with dryness   Cheilitis secondary to isotretinoin therapy - Continue lip balm as directed, Dr. Clayborne Artist Cortibalm recommended   Long term medication management (isotretinoin).  Patient is using long term (months to years) prescription medication  to control their dermatologic condition.  These medications require periodic monitoring to evaluate for efficacy and side effects and may require periodic laboratory monitoring.  - While taking Isotretinoin and for 30 days after you finish the medication, do not get pregnant, do not share pills, do not donate blood. Isotretinoin is best absorbed when taken with a fatty meal. Isotretinoin can make you sensitive to the sun. Daily careful sun protection including sunscreen SPF 30+ when outdoors is recommended.   MELANOCYTIC NEVI At nose  Exam: Tan-brown and/or pink-flesh-colored symmetric macules and papules  Treatment Plan: Benign appearing on exam today. Recommend observation. Call clinic for new or  changing moles. Recommend daily use of broad spectrum spf 30+ sunscreen to sun-exposed areas.   Patient bothered by recommend waiting until complete isotretinoin therapy.   Follow-up in 30 days.  Jody Perez, CMA, am acting as scribe for Armida Sans, MD.   Documentation: I have reviewed the above documentation for accuracy and completeness, and I agree with the above.  Armida Sans, MD

## 2023-07-27 ENCOUNTER — Encounter: Payer: Self-pay | Admitting: Dermatology

## 2023-08-29 ENCOUNTER — Ambulatory Visit
Admission: EM | Admit: 2023-08-29 | Discharge: 2023-08-29 | Disposition: A | Discharge status: Home / Self Care | Payer: Medicaid Other | Attending: Family Medicine | Admitting: Family Medicine

## 2023-08-29 DIAGNOSIS — N898 Other specified noninflammatory disorders of vagina: Secondary | ICD-10-CM | POA: Diagnosis present

## 2023-08-29 DIAGNOSIS — B9689 Other specified bacterial agents as the cause of diseases classified elsewhere: Secondary | ICD-10-CM | POA: Insufficient documentation

## 2023-08-29 DIAGNOSIS — N760 Acute vaginitis: Secondary | ICD-10-CM | POA: Insufficient documentation

## 2023-08-29 LAB — WET PREP, GENITAL
Sperm: NONE SEEN
Trich, Wet Prep: NONE SEEN
WBC, Wet Prep HPF POC: <10 (ref <10)
Yeast Wet Prep HPF POC: NONE SEEN

## 2023-08-29 MED ORDER — FLUCONAZOLE 150 MG PO TABS: 150.0000 mg | ORAL_TABLET | ORAL | 0 refills | Status: AC

## 2023-08-29 MED ORDER — METRONIDAZOLE 500 MG PO TABS: 500.0000 mg | ORAL_TABLET | Freq: Two times a day (BID) | ORAL | 0 refills | Status: DC

## 2023-08-29 NOTE — Discharge Instructions (Signed)
You had evidence of of a bacterial vaginal infection today.  Stop by the pharmacy to pick up your prescriptions.  For BV/bacterial vaginosis: Take metronidazole twice a day for the next 7 days.  Do not drink any alcohol with taking this medication.  Your STD test results will be available in the next 72 hours. If positive, someone will contact you.  You should see your results in your MyChart account.   If your symptoms do not improve in the next 7 days, be sure to follow-up here or at your primary care provider office.  Go to the emergency department if you are having increasing pain, worsening vaginal bleeding or fever.

## 2023-08-29 NOTE — ED Triage Notes (Signed)
Pt c/o vaginal discharge & itchiness x2 days.

## 2023-08-29 NOTE — ED Provider Notes (Signed)
[facility]    CSN: [id number] Arrival date & time: 08/29/23  0836      History   Chief Complaint Chief Complaint  Patient presents with   Vaginal Discharge   SEXUALLY TRANSMITTED DISEASE     HPI HPI Jody Perez is a 32 y.o. female.    Jody Perez presents for thin clear vaginal discharge for the past 2 days with vaginal itching.  Has history of BV.  She has similar symptoms after having sex with the same partner.  Tried probiotic prior to arrival. Denies known STI exposure.  Jody Perez does not use condoms regularly. She is  not currently pregnant.  No LMP recorded. Patient has had an injection.  Denies dysuria, urgency, frequency, abdominal pain, back pain,   - vaginal odor: no - vaginal bleeding: no - Hematuria: no - Fever: no - Pelvic pain: no - Rash/Skin lesions/mouth ulcers: no - Nausea: no  - Vomiting: no         Past Medical History:  Diagnosis Date   BV (bacterial vaginosis)    Chlamydia    Herpes genitalis in women    HSV infection    Migraines    Miscarriage    Trichomonas    UTI (lower urinary tract infection)     Patient Active Problem List   Diagnosis Date Noted   Blurry vision 02/28/2022   Herniated disc, cervical 02/25/2022   Iron deficiency anemia, unspecified 02/25/2022   Hemorrhagic ovarian cyst 02/25/2022   Leukocytosis 02/25/2022   Incomplete miscarriage 01/01/22 01/24/2022   Trichomonas infection12/17/19 01/23/2022   Liposuction 08/2021 01/23/2022   Migraine without aura and without status migrainosus, not intractable 01/11/2021   Vapes nicotine containing substance 07/30/2020   Obesity BMI=29.9 04/07/2020   Recurrent vaginitis 06/06/2019   History of genital HSV 01/28/2013    Past Surgical History:  Procedure Laterality Date   LIPOSUCTION  09/01/2021   WISDOM TOOTH EXTRACTION      OB History     Gravida  6   Para  3   Term  3   Preterm  0   AB  3   Living  3      SAB  3   IAB  0   Ectopic  0    Multiple  0   Live Births  3            Home Medications    Prior to Admission medications   Medication Sig Start Date End Date Taking? Authorizing Provider  fluconazole (DIFLUCAN) 150 MG tablet Take 1 tablet (150 mg total) by mouth every 3 (three) days for 2 doses. 08/29/23 09/02/23 Yes Jody Perez, Jody Meth, Jody Perez  valACYclovir (VALTREX) 500 MG tablet Take 1 tablet (500 mg total) by mouth 2 (two) times daily. 03/08/23  Yes Jody Donnan, Jody Perez  metroNIDAZOLE (FLAGYL) 500 MG tablet Take 1 tablet (500 mg total) by mouth 2 (two) times daily. 08/29/23   Jody Cabal, Jody Perez    Family History Family History  Problem Relation Age of Onset   Hypertension Mother    Cancer Mother        Pitutary   Healthy Father    Healthy Brother    Hypertension Brother    Cervical cancer Brother    Healthy Brother    Breast cancer Maternal Grandmother    Colon cancer Neg Hx    Ovarian cancer Neg Hx     Social History Social History   Tobacco Use   Smoking status: Every Day  Types: E-cigarettes    Passive exposure: Never   Smokeless tobacco: Never  Vaping Use   Vaping status: Every Day   Substances: Nicotine, Flavoring  Substance Use Topics   Alcohol use: Not Currently    Comment: Socially   Drug use: Not Currently    Types: Marijuana    Comment: last use age 33     Allergies   Patient has no known allergies.   Review of Systems Review of Systems: :negative unless otherwise stated in HPI.      Physical Exam Triage Vital Signs ED Triage Vitals  Encounter Vitals Group     BP 08/29/23 0901 127/84     Systolic BP Percentile --      Diastolic BP Percentile --      Pulse Rate 08/29/23 0901 87     Resp 08/29/23 0901 16     Temp 08/29/23 0901 99.2 F (37.3 C)     Temp Source 08/29/23 0901 Oral     SpO2 08/29/23 0901 98 %     Weight 08/29/23 0900 185 lb (83.9 kg)     Height 08/29/23 0900 5\' 5"  (1.651 m)     Head Circumference --      Peak Flow --      Pain Score 08/29/23 0904 0      Pain Loc --      Pain Education --      Exclude from Growth Chart --    No data found.  Updated Vital Signs BP 127/84 (BP Location: Left Arm)   Pulse 87   Temp 99.2 F (37.3 C) (Oral)   Resp 16   Ht 5\' 5"  (1.651 m)   Wt 83.9 kg   SpO2 98%   BMI 30.79 kg/m   Visual Acuity Right Eye Distance:   Left Eye Distance:   Bilateral Distance:    Right Eye Near:   Left Eye Near:    Bilateral Near:     Physical Exam GEN: well appearing female in no acute distress  CVS: well perfused  RESP: speaking in full sentences without pause  GU: deferred, patient performed self swab     UC Treatments / Results  Labs (all labs ordered are listed, but only abnormal results are displayed) Labs Reviewed  WET PREP, GENITAL - Abnormal; Notable for the following components:      Result Value   Clue Cells Wet Prep HPF POC PRESENT (*)    All other components within normal limits  CERVICOVAGINAL ANCILLARY ONLY    EKG   Radiology No results found.  Procedures Procedures (including critical care time)  Medications Ordered in UC Medications - No data to display  Initial Impression / Assessment and Plan / UC Course  I have reviewed the triage vital signs and the nursing notes.  Pertinent labs & imaging results that were available during my care of the patient were reviewed by me and considered in my medical decision making (see chart for details).      Patient is a 32 y.o.[name] female  who presents for vaginal discharge with itching after having unprotected intercourse.  Overall patient is well-appearing and afebrile.  Vital signs stable.  Wet prep showing evidence of bacterial vaginitis but no trichomonas or yeast vaginitis .  Self care instructions given including avoiding douching Gonorrhea and Chlamydia testing obtained.   - Treatment: Flagyl 500 BID x 7 days and advised patient to not drink alcohol while taking this medication.  - Diflucan for 2 doses  for prevention of yeast  infection    Return precautions including abdominal pain, fever, chills, nausea, or vomiting given. Discussed MDM, treatment plan and plan for follow-up with patient who agrees with plan.           Final Clinical Impressions(s) / UC Diagnoses   Final diagnoses:  BV (bacterial vaginosis)  Vaginal discharge     Discharge Instructions      You had evidence of of a bacterial vaginal infection today.  Stop by the pharmacy to pick up your prescriptions.  For BV/bacterial vaginosis: Take metronidazole twice a day for the next 7 days.  Jody Perez not drink any alcohol with taking this medication.  Your STD test results will be available in the next 72 hours. If positive, someone will contact you.  You should see your results in your MyChart account.   If your symptoms Jody Perez not improve in the next 7 days, be sure to follow-up here or at your primary care provider office.  Go to the emergency department if you are having increasing pain, worsening vaginal bleeding or fever.      ED Prescriptions     Medication Sig Dispense Auth. Provider   metroNIDAZOLE (FLAGYL) 500 MG tablet Take 1 tablet (500 mg total) by mouth 2 (two) times daily. 14 tablet Jody Hoctor, Jody Perez   fluconazole (DIFLUCAN) 150 MG tablet Take 1 tablet (150 mg total) by mouth every 3 (three) days for 2 doses. 2 tablet Jody Cabal, Jody Perez      PDMP not reviewed this encounter.   Jody Cabal, Jody Perez 08/29/23 [phone]

## 2023-08-30 ENCOUNTER — Ambulatory Visit (INDEPENDENT_AMBULATORY_CARE_PROVIDER_SITE_OTHER): Payer: Medicaid Other | Admitting: Dermatology

## 2023-08-30 ENCOUNTER — Telehealth: Payer: Self-pay

## 2023-08-30 ENCOUNTER — Encounter: Payer: Self-pay | Admitting: Dermatology

## 2023-08-30 ENCOUNTER — Other Ambulatory Visit: Payer: Self-pay | Admitting: Dermatology

## 2023-08-30 VITALS — BP 120/76 | HR 72 | Wt 185.0 lb

## 2023-08-30 DIAGNOSIS — L853 Xerosis cutis: Secondary | ICD-10-CM | POA: Diagnosis not present

## 2023-08-30 DIAGNOSIS — Z7189 Other specified counseling: Secondary | ICD-10-CM

## 2023-08-30 DIAGNOSIS — L700 Acne vulgaris: Secondary | ICD-10-CM | POA: Diagnosis not present

## 2023-08-30 DIAGNOSIS — Z79899 Other long term (current) drug therapy: Secondary | ICD-10-CM

## 2023-08-30 DIAGNOSIS — K130 Diseases of lips: Secondary | ICD-10-CM | POA: Diagnosis not present

## 2023-08-30 MED ORDER — DOXYCYCLINE HYCLATE 100 MG PO CAPS: 100.0000 mg | ORAL_CAPSULE | Freq: Two times a day (BID) | ORAL | 0 refills | Status: DC

## 2023-08-30 MED ORDER — ISOTRETINOIN 30 MG PO CAPS: ORAL_CAPSULE | ORAL | 0 refills | Status: DC

## 2023-08-30 NOTE — Patient Instructions (Addendum)
Start isotretinoin 30 mg 2 by mouth daily. Take with fatty meal.    While taking Isotretinoin and for 30 days after you finish the medication, do not get pregnant, do not share pills, do not donate blood. Isotretinoin is best absorbed when taken with a fatty meal. Isotretinoin can make you sensitive to the sun. Daily careful sun protection including sunscreen SPF 30+ when outdoors is recommended.   Recommend daily broad spectrum sunscreen SPF 30+ to sun-exposed areas, reapply every 2 hours as needed. Call for new or changing lesions.  Staying in the shade or wearing long sleeves, sun glasses (UVA+UVB protection) and wide brim hats (4-inch brim around the entire circumference of the hat) are also recommended for sun protection.    Due to recent changes in healthcare laws, you may see results of your pathology and/or laboratory studies on MyChart before the doctors have had a chance to review them. We understand that in some cases there may be results that are confusing or concerning to you. Please understand that not all results are received at the same time and often the doctors may need to interpret multiple results in order to provide you with the best plan of care or course of treatment. Therefore, we ask that you please give [place] 2 business days to thoroughly review all your results before contacting the office for clarification. Should we see a critical lab result, you will be contacted sooner.   If You Need Anything After Your Visit  If you have any questions or concerns for your doctor, please call our main line at [phone] and press option 4 to reach your doctor's medical assistant. If no one answers, please leave a voicemail as directed and we will return your call as soon as possible. Messages left after 4 pm will be answered the following business day.   You may also send [place] a message via MyChart. We typically respond to MyChart messages within 1-2 business days.  For prescription  refills, please ask your pharmacy to contact our office. Our fax number is [phone].  If you have an urgent issue when the clinic is closed that cannot wait until the next business day, you can page your doctor at the number below.    Please note that while we do our best to be available for urgent issues outside of office hours, we are not available 24/7.   If you have an urgent issue and are unable to reach [place], you may choose to seek medical care at your doctor's office, retail clinic, urgent care center, or emergency room.  If you have a medical emergency, please immediately call 911 or go to the emergency department.  Pager Numbers  - Dr. Gwen Pounds: [phone]  - Dr. Roseanne Reno: [phone]  - Dr. Katrinka Blazing: [phone]   In the event of inclement weather, please call our main line at [phone] for an update on the status of any delays or closures.  Dermatology Medication Tips: Please keep the boxes that topical medications come in in order to help keep track of the instructions about where and how to use these. Pharmacies typically print the medication instructions only on the boxes and not directly on the medication tubes.   If your medication is too expensive, please contact our office at [phone] option 4 or send [place] a message through MyChart.   We are unable to tell what your co-pay for medications will be in advance as this is different depending on your insurance coverage. However, we may  be able to find a substitute medication at lower cost or fill out paperwork to get insurance to cover a needed medication.   If a prior authorization is required to get your medication covered by your insurance company, please allow [place] 1-2 business days to complete this process.  Drug prices often vary depending on where the prescription is filled and some pharmacies may offer cheaper prices.  The website www.goodrx.com contains coupons for medications through different pharmacies.  The prices here do not account for what the cost may be with help from insurance (it may be cheaper with your insurance), but the website can give you the price if you did not use any insurance.  - You can print the associated coupon and take it with your prescription to the pharmacy.  - You may also stop by our office during regular business hours and pick up a GoodRx coupon card.  - If you need your prescription sent electronically to a different pharmacy, notify our office through [facility] or by phone at [phone] option 4.     Si Usted Necesita Algo Despus de Su Visita  Tambin puede enviarnos un mensaje a travs de Clinical cytogeneticist. Por lo general respondemos a los mensajes de MyChart en el transcurso de 1 a 2 das hbiles.  Para renovar recetas, por favor pida a su farmacia que se ponga en contacto con nuestra oficina. Annie Sable de fax es Dawn [phone].  Si tiene un asunto urgente cuando la clnica est cerrada y que no puede esperar hasta el siguiente da hbil, puede llamar/localizar a su doctor(a) al nmero que aparece a continuacin.   Por favor, tenga en cuenta que aunque hacemos todo lo posible para estar disponibles para asuntos urgentes fuera del horario de [place], no estamos disponibles las 24 horas del da, los 7 [address]  [address] de la [place].   Si tiene un problema urgente y no puede comunicarse con nosotros, puede optar por buscar atencin mdica  en el consultorio de su doctor(a), en una clnica privada, en un centro de atencin urgente o en una sala de emergencias.  Si tiene Engineer, drilling, por favor llame inmediatamente al 911 o vaya a la sala de emergencias.  Nmeros de bper  - Dr. Gwen Pounds: [phone]  - Dra. Roseanne Reno: [phone]  - Dr. Katrinka Blazing: [phone]   En caso de inclemencias del tiempo, por favor llame a Lacy Duverney principal al [phone] para una actualizacin sobre el [place] de cualquier retraso o cierre.  Consejos para la medicacin  en dermatologa: Por favor, guarde las cajas en las que vienen los medicamentos de uso tpico para ayudarle a seguir las instrucciones sobre dnde y cmo usarlos. Las farmacias generalmente imprimen las instrucciones del medicamento slo en las cajas y no directamente en los tubos del [place].   Si su medicamento es muy caro, por favor, pngase en contacto con Rolm Gala llamando al [phone] y presione la opcin 4 o envenos un mensaje a travs de Clinical cytogeneticist.   No podemos decirle cul ser su copago por los medicamentos por adelantado ya que esto es diferente dependiendo de la cobertura de su seguro. Sin embargo, es posible que podamos encontrar un medicamento sustituto a Audiological scientist un formulario para que el seguro cubra el medicamento que se considera necesario.   Si se requiere una autorizacin previa para que su compaa de seguros [place] su medicamento, por favor permtanos de 1 a 2 das hbiles para completar [address]  Los precios de los [employer]  varan con frecuencia dependiendo del lugar de dnde se surte la receta y alguna farmacias pueden ofrecer precios ms baratos.  El sitio web www.goodrx.com tiene cupones para medicamentos de Health and safety inspector. Los precios aqu no tienen en cuenta lo que podra costar con la ayuda del seguro (puede ser ms barato con su seguro), pero el sitio web puede darle el precio si no utiliz Tourist information centre manager.  - Puede imprimir el cupn correspondiente y llevarlo con su receta a la farmacia.  - Tambin puede pasar por nuestra oficina durante el horario de atencin regular y Education officer, museum una tarjeta de cupones de GoodRx.  - Si necesita que su receta se enve electrnicamente a una farmacia diferente, informe a nuestra oficina a travs de MyChart de [place] o por telfono llamando al [phone] y presione la opcin 4.

## 2023-08-30 NOTE — Telephone Encounter (Signed)
Contacted patient by phone.  Verified identity using two identifiers.  Provided positive result.  Reviewed safe sex practices, notifying partners, and refraining from sexual activities for 7 days from time of treatment.  Patient verified understanding, all questions answered.   Pt requires tx with Doxycycline. Reviewed with patient, verified pharmacy, prescription sent.

## 2023-08-30 NOTE — Progress Notes (Signed)
Isotretinoin Follow-Up Visit   Subjective  Jody Perez is a 32 y.o. female who presents for the following: Isotretinoin follow-up  Week # 12   Isotretinoin F/U - 08/30/23 1000       Isotretinoin Follow Up   iPledge # [id number]    Date 08/30/23    Weight 185 lb (83.9 kg)    Two Forms of Birth Control Injection;Female Condom    Acne breakouts since last visit? Yes      Dosage   Target Dosage (mg) 12990    Current (To Date) Dosage (mg) 2700    To Go Dosage (mg) 10290      Skin Side Effects   Dry Lips Yes    Nose bleeds No    Dry eyes No    Dry Skin No    Sunburn No      Gastrointestinal Side Effects   Nausea No    Diarrhea No    Blood in stool No      Neurological Side Effects   Blurred vision No    Depression No    Headache Yes    Homicidal thoughts No    Mood Changes No    Suicidal thoughts No      Constitutional Side Effects   Fatigue No      Musculoskeletal Side Effects   Muscle aches No              Side effects: Dry skin, dry lips  Patient is not pregnant, not seeking pregnancy, and not breastfeeding.   The following portions of the chart were reviewed this encounter and updated as appropriate: medications, allergies, medical history  Review of Systems:  No other skin or systemic complaints except as noted in HPI or Assessment and Plan.  Objective  Well appearing patient in no apparent distress; mood and affect are within normal limits.  An examination of the face, neck, chest, and back was performed and relevant findings are noted below.     Assessment & Plan     ACNE VULGARIS with perioral dermatitis Patient is currently on Isotretinoin requiring FDA mandated monthly evaluations and laboratory monitoring. Condition is currently not to goal (must reach target dose based on weight and also have clear skin for 2 months prior to discontinuation in order to help prevent relapse)  Exam findings: Erythematous papules, hyperpigmented  macules and papules predominantly at jaw line and chin  Week # 12 Pharmacy [facility] [place]  iPLEDGE # [id number]  Total mg -  2700 mg Total mg/kg - 32.18 mg/kg Birth Control- Depo shot/female condoms   Start isotretinoin 30 mg 2 by mouth daily. Take with fatty meal.   Urine pregnancy test performed in office today and was negative.  Patient demonstrates comprehension and confirms she will not get pregnant.   Patient confirmed in iPledge and isotretinoin sent to pharmacy.    Xerosis secondary to isotretinoin therapy - Continue emollients as directed - Xyzal (levocetirizine) once a day and fish oil 1 gram daily may also help with dryness   Cheilitis secondary to isotretinoin therapy - Continue lip balm as directed, Dr. Clayborne Artist Cortibalm recommended   Long term medication management (isotretinoin)  Patient is using long term (months to years) prescription medication  to control their dermatologic condition.  These medications require periodic monitoring to evaluate for efficacy and side effects and may require periodic laboratory monitoring.  - While taking Isotretinoin and for 30 days after you finish the medication, do not get pregnant, do not  share pills, do not donate blood. Isotretinoin is best absorbed when taken with a fatty meal. Isotretinoin can make you sensitive to the sun. Daily careful sun protection including sunscreen SPF 30+ when outdoors is recommended.  Follow-up in 30 days.  I, Lawson Radar, CMA, am acting as scribe for Armida Sans, MD.   Documentation: I have reviewed the above documentation for accuracy and completeness, and I agree with the above.  Armida Sans, MD

## 2023-08-31 ENCOUNTER — Encounter: Payer: Self-pay | Admitting: Emergency Medicine

## 2023-08-31 ENCOUNTER — Ambulatory Visit
Admission: EM | Admit: 2023-08-31 | Discharge: 2023-08-31 | Disposition: A | Discharge status: Home / Self Care | Payer: Medicaid Other | Attending: Family Medicine | Admitting: Family Medicine

## 2023-08-31 ENCOUNTER — Encounter: Payer: Self-pay | Admitting: Dermatology

## 2023-08-31 DIAGNOSIS — Z79899 Other long term (current) drug therapy: Secondary | ICD-10-CM | POA: Diagnosis not present

## 2023-08-31 DIAGNOSIS — A749 Chlamydial infection, unspecified: Secondary | ICD-10-CM | POA: Diagnosis not present

## 2023-08-31 NOTE — Discharge Instructions (Addendum)
Start doxycycline twice a day for the next 7 days.  Wait 1 week after finishing your antibiotic course and then restart your Accutane.  Call your Accutane prescriber as they will need to send you a new prescription, as the pharmacy will delete your current Accutane prescription.

## 2023-08-31 NOTE — ED Triage Notes (Signed)
Patient states that she was seen here on 08/29/23.  Patient states that she has not started her medications for Flagyl or Doxycycline.  Patient states that she saw her Dermatologist yesterday and was given prescription for Acutane to start.  Patient states that the Doxycycline interacts with Acutane and needs a different medicine to treat Chlamydia.

## 2023-08-31 NOTE — ED Provider Notes (Signed)
[facility]    CSN: [id number] Arrival date & time: 08/31/23  1202      History   Chief Complaint Chief Complaint  Patient presents with   Medication Reaction     HPI HPI Jody Perez is a 32 y.o. female.    Jody Perez presents for medication interactions.  She was told not to take the doxycycline as she is taking Accutane.  She was diagnosed with chlamydia on her last visit.      Past Medical History:  Diagnosis Date   BV (bacterial vaginosis)    Chlamydia    Herpes genitalis in women    HSV infection    Migraines    Miscarriage    Trichomonas    UTI (lower urinary tract infection)     Patient Active Problem List   Diagnosis Date Noted   Blurry vision 02/28/2022   Herniated disc, cervical 02/25/2022   Iron deficiency anemia, unspecified 02/25/2022   Hemorrhagic ovarian cyst 02/25/2022   Leukocytosis 02/25/2022   Incomplete miscarriage 01/01/22 01/24/2022   Trichomonas infection12/17/19 01/23/2022   Liposuction 08/2021 01/23/2022   Migraine without aura and without status migrainosus, not intractable 01/11/2021   Vapes nicotine containing substance 07/30/2020   Obesity BMI=29.9 04/07/2020   Recurrent vaginitis 06/06/2019   History of genital HSV 01/28/2013    Past Surgical History:  Procedure Laterality Date   LIPOSUCTION  09/01/2021   WISDOM TOOTH EXTRACTION      OB History     Gravida  6   Para  3   Term  3   Preterm  0   AB  3   Living  3      SAB  3   IAB  0   Ectopic  0   Multiple  0   Live Births  3            Home Medications    Prior to Admission medications   Medication Sig Start Date End Date Taking? Authorizing Provider  doxycycline (VIBRAMYCIN) 100 MG capsule Take 1 capsule (100 mg total) by mouth 2 (two) times daily. 08/30/23   Merrilee Jansky, MD  fluconazole (DIFLUCAN) 150 MG tablet Take 1 tablet (150 mg total) by mouth every 3 (three) days for 2 doses. 08/29/23 09/02/23  Katha Cabal, DO   ISOtretinoin (ACCUTANE) 30 MG capsule Take 2 capsules daily 08/30/23   Deirdre Evener, MD  metroNIDAZOLE (FLAGYL) 500 MG tablet Take 1 tablet (500 mg total) by mouth 2 (two) times daily. 08/29/23   Mazella Deen, [place] Meth, DO  valACYclovir (VALTREX) 500 MG tablet Take 1 tablet (500 mg total) by mouth 2 (two) times daily. 03/08/23   Katha Cabal, DO    Family History Family History  Problem Relation Age of Onset   Hypertension Mother    Cancer Mother        Pitutary   Healthy Father    Healthy Brother    Hypertension Brother    Cervical cancer Brother    Healthy Brother    Breast cancer Maternal Grandmother    Colon cancer Neg Hx    Ovarian cancer Neg Hx     Social History Social History   Tobacco Use   Smoking status: Every Day    Types: E-cigarettes    Passive exposure: Never   Smokeless tobacco: Never  Vaping Use   Vaping status: Every Day   Substances: Nicotine, Flavoring  Substance Use Topics   Alcohol use: Not Currently  Comment: Socially   Drug use: Not Currently    Types: Marijuana    Comment: last use age 47     Allergies   Patient has no known allergies.   Review of Systems Review of Systems: :negative unless otherwise stated in HPI.      Physical Exam Triage Vital Signs ED Triage Vitals  Encounter Vitals Group     BP      Systolic BP Percentile      Diastolic BP Percentile      Pulse      Resp      Temp      Temp src      SpO2      Weight      Height      Head Circumference      Peak Flow      Pain Score      Pain Loc      Pain Education      Exclude from Growth Chart    No data found.  Updated Vital Signs BP 136/85 (BP Location: Right Arm)   Pulse 79   Temp 98.4 F (36.9 C) (Oral)   Resp 15   Ht 5\' 5"  (1.651 m)   Wt 83.9 kg   SpO2 100%   BMI 30.78 kg/m   Visual Acuity Right Eye Distance:   Left Eye Distance:   Bilateral Distance:    Right Eye Near:   Left Eye Near:    Bilateral Near:     Physical Exam GEN: well  appearing female in no acute distress  CVS: well perfused  RESP: speaking in full sentences without pause, no respiratory distress     UC Treatments / Results  Labs (all labs ordered are listed, but only abnormal results are displayed) Labs Reviewed - No data to display  EKG   Radiology No results found.  Procedures Procedures (including critical care time)  Medications Ordered in UC Medications - No data to display  Initial Impression / Assessment and Plan / UC Course  I have reviewed the triage vital signs and the nursing notes.  Pertinent labs & imaging results that were available during my care of the patient were reviewed by me and considered in my medical decision making (see chart for details).      Patient is a 32 y.o.Jody Perez female  who presents for potential medication interactions.  She has been taking Accutane for acne but has not taken it in the last 7 days.  Overall patient is well-appearing and afebrile.  Vital signs stable.  Discussed alternative treatment with patient regarding chlamydia but she does not want to risk possible failure and treatment with azithromycin.  Spoke with outpatient pharmacist regarding Accutane and doxycycline use and the best way to go about this.  She will need a 7-day washout from the last time she took Accutane which she reports was 7 days ago.  She would then need a 7-day washout of the doxycycline before restarting the Accutane.  This was discussed with the patient.  The pharmacist will delete her current Accutane prescription and she will not be able to refill it.  She will need a new prescription sent to her pharmacy.  This was also discussed with the patient.  She will reach out to her Accutane prescriber for a new prescription after waiting a week after completing her doxycycline.   Discussed MDM, treatment plan and plan for follow-up with patient who agrees with plan.  Final Clinical Impressions(s) / UC Diagnoses   Final  diagnoses:  Chlamydia  On Accutane therapy     Discharge Instructions      Start doxycycline twice a day for the next 7 days.  Wait 1 week after finishing your antibiotic course and then restart your Accutane.  Call your Accutane prescriber as they will need to send you a new prescription, as the pharmacy will delete your current Accutane prescription.     ED Prescriptions   None    PDMP not reviewed this encounter.   Katha Cabal, DO 08/31/23 1258

## 2023-09-03 LAB — CERVICOVAGINAL ANCILLARY ONLY
Chlamydia: POSITIVE — AB
Comment: NEGATIVE
Comment: NEGATIVE
Comment: NORMAL
Neisseria Gonorrhea: NEGATIVE
Trichomonas: NEGATIVE

## 2023-09-20 ENCOUNTER — Ambulatory Visit: Payer: Medicaid Other

## 2023-09-20 VITALS — BP 121/73 | Ht 64.0 in | Wt 185.0 lb

## 2023-09-20 DIAGNOSIS — Z23 Encounter for immunization: Secondary | ICD-10-CM

## 2023-09-20 DIAGNOSIS — Z3042 Encounter for surveillance of injectable contraceptive: Secondary | ICD-10-CM

## 2023-09-20 DIAGNOSIS — Z309 Encounter for contraceptive management, unspecified: Secondary | ICD-10-CM

## 2023-09-20 DIAGNOSIS — Z30013 Encounter for initial prescription of injectable contraceptive: Secondary | ICD-10-CM | POA: Diagnosis not present

## 2023-09-20 DIAGNOSIS — Z719 Counseling, unspecified: Secondary | ICD-10-CM

## 2023-09-20 DIAGNOSIS — Z3009 Encounter for other general counseling and advice on contraception: Secondary | ICD-10-CM

## 2023-09-20 NOTE — Progress Notes (Signed)
13w 0d post depo. Voices no concerns. Depo given today per order by Aliene Altes, FNP dated 06/21/2023. Tolerated well in L deltoid. Next depo due 12/06/2023.   Abagail Kitchens, RN

## 2023-10-02 ENCOUNTER — Ambulatory Visit (INDEPENDENT_AMBULATORY_CARE_PROVIDER_SITE_OTHER): Payer: Medicaid Other | Admitting: Dermatology

## 2023-10-02 VITALS — Wt 185.0 lb

## 2023-10-02 DIAGNOSIS — L700 Acne vulgaris: Secondary | ICD-10-CM | POA: Diagnosis not present

## 2023-10-02 DIAGNOSIS — Z79899 Other long term (current) drug therapy: Secondary | ICD-10-CM | POA: Diagnosis not present

## 2023-10-02 DIAGNOSIS — Z7189 Other specified counseling: Secondary | ICD-10-CM | POA: Diagnosis not present

## 2023-10-02 MED ORDER — TACROLIMUS 0.1 % EX OINT: TOPICAL_OINTMENT | Freq: Two times a day (BID) | CUTANEOUS | 0 refills | Status: DC

## 2023-10-02 NOTE — Patient Instructions (Signed)
Due to recent changes in healthcare laws, you may see results of your pathology and/or laboratory studies on MyChart before the doctors have had a chance to review them. We understand that in some cases there may be results that are confusing or concerning to you. Please understand that not all results are received at the same time and often the doctors may need to interpret multiple results in order to provide you with the best plan of care or course of treatment. Therefore, we ask that you please give [place] 2 business days to thoroughly review all your results before contacting the office for clarification. Should we see a critical lab result, you will be contacted sooner.   If You Need Anything After Your Visit  If you have any questions or concerns for your doctor, please call our main line at [phone] and press option 4 to reach your doctor's medical assistant. If no one answers, please leave a voicemail as directed and we will return your call as soon as possible. Messages left after 4 pm will be answered the following business day.   You may also send [place] a message via MyChart. We typically respond to MyChart messages within 1-2 business days.  For prescription refills, please ask your pharmacy to contact our office. Our fax number is [phone].  If you have an urgent issue when the clinic is closed that cannot wait until the next business day, you can page your doctor at the number below.    Please note that while we do our best to be available for urgent issues outside of office hours, we are not available 24/7.   If you have an urgent issue and are unable to reach [place], you may choose to seek medical care at your doctor's office, retail clinic, urgent care center, or emergency room.  If you have a medical emergency, please immediately call 911 or go to the emergency department.  Pager Numbers  - Dr. Gwen Pounds: [phone]  - Dr. Roseanne Reno: [phone]  - Dr. Katrinka Blazing: [phone]    In the event of inclement weather, please call our main line at [phone] for an update on the status of any delays or closures.  Dermatology Medication Tips: Please keep the boxes that topical medications come in in order to help keep track of the instructions about where and how to use these. Pharmacies typically print the medication instructions only on the boxes and not directly on the medication tubes.   If your medication is too expensive, please contact our office at [phone] option 4 or send [place] a message through MyChart.   We are unable to tell what your co-pay for medications will be in advance as this is different depending on your insurance coverage. However, we may be able to find a substitute medication at lower cost or fill out paperwork to get insurance to cover a needed medication.   If a prior authorization is required to get your medication covered by your insurance company, please allow [place] 1-2 business days to complete this process.  Drug prices often vary depending on where the prescription is filled and some pharmacies may offer cheaper prices.  The website www.goodrx.com contains coupons for medications through different pharmacies. The prices here do not account for what the cost may be with help from insurance (it may be cheaper with your insurance), but the website can give you the price if you did not use any insurance.  - You can print the associated coupon and take it  with your prescription to the pharmacy.  - You may also stop by our office during regular business hours and pick up a GoodRx coupon card.  - If you need your prescription sent electronically to a different pharmacy, notify our office through [facility] or by phone at [phone] option 4.

## 2023-10-02 NOTE — Progress Notes (Signed)
Isotretinoin Follow-Up Visit   Subjective  Jody Perez is a 32 y.o. female who presents for the following: Isotretinoin follow-up  Week # 16   Isotretinoin F/U - 10/02/23 0900       Isotretinoin Follow Up   iPledge # [id number]    Date 10/02/23    Weight 185 lb (83.9 kg)    Two Forms of Birth Control Injection;Female Condom    Acne breakouts since last visit? No      Dosage   Target Dosage (mg) 12990    Current (To Date) Dosage (mg) 4500    To Go Dosage (mg) 8490      Skin Side Effects   Dry Lips Yes    Nose bleeds No    Dry eyes No    Dry Skin No    Sunburn No      Gastrointestinal Side Effects   Nausea No    Diarrhea No    Blood in stool No      Neurological Side Effects   Blurred vision No    Depression No    Headache No    Homicidal thoughts No    Mood Changes No    Suicidal thoughts No      Constitutional Side Effects   Fatigue No      Musculoskeletal Side Effects   Muscle aches No              Side effects: Dry skin, dry lips  Patient is not pregnant, not seeking pregnancy, and not breastfeeding.   The following portions of the chart were reviewed this encounter and updated as appropriate: medications, allergies, medical history  Review of Systems:  No other skin or systemic complaints except as noted in HPI or Assessment and Plan.  Objective  Well appearing patient in no apparent distress; mood and affect are within normal limits.  An examination of the face, neck, chest, and back was performed and relevant findings are noted below.     Assessment & Plan   Acne vulgaris  Related Procedures Lipid panel hCG, serum, qualitative Hepatic Function Panel CMP    ACNE VULGARIS Patient is currently on Isotretinoin requiring FDA mandated monthly evaluations and laboratory monitoring. Condition is currently not to goal (must reach target dose based on weight and also have clear skin for 2 months prior to discontinuation in order to  help prevent relapse)  Exam findings: Mostly clear  Week # 16 Pharmacy [facility] [place]  iPLEDGE # [id number]  Total mg -  2700 mg Total mg/kg - 32.18 mg/kg Birth Control- Depo shot/female condoms   Continue isotretinoin 30 mg 2 po every day pending labs  Isotretinoin Counseling; Review and Contraception Counseling: Reviewed potential side effects of isotretinoin including xerosis, cheilitis, hepatitis, hyperlipidemia, and severe birth defects if taken by a pregnant woman.  Women on isotretinoin must be celibate (not having sex) or required to use at least 2 birth control methods to prevent pregnancy (unless patient is a female of non-child bearing potential).  Females of child-bearing potential must have monthly pregnancy tests while on isotretinoin and report through I-Pledge (FDA monitoring program). Reviewed reports of suicidal ideation in those with a history of depression while taking isotretinoin and reports of diagnosis of inflammatory bowl disease (IBD) while taking isotretinoin as well as the lack of evidence for a causal relationship between isotretinoin, depression and IBD. Patient advised to reach out with any questions or concerns. Patient advised not to share pills or donate blood while on  treatment or for one month after completing treatment. All patient's considering Isotretinoin must read and understand and sign Isotretinoin Consent Form and be registered with I-Pledge.     Xerosis secondary to isotretinoin therapy - Continue emollients as directed - Xyzal (levocetirizine) once a day and fish oil 1 gram daily may also help with dryness   Cheilitis secondary to isotretinoin therapy - Continue lip balm as directed, Dr. Clayborne Artist Cortibalm recommended - Will send in tacrolimus twice daily to lips as needed.   Long term medication management (isotretinoin)  Patient is using long term (months to years) prescription medication  to control their dermatologic condition.  These  medications require periodic monitoring to evaluate for efficacy and side effects and may require periodic laboratory monitoring.  - While taking Isotretinoin and for 30 days after you finish the medication, do not get pregnant, do not share pills, do not donate blood. Isotretinoin is best absorbed when taken with a fatty meal. Isotretinoin can make you sensitive to the sun. Daily careful sun protection including sunscreen SPF 30+ when outdoors is recommended.  Follow-up in 30 days.  Anise Salvo, RMA, am acting as scribe for Armida Sans, MD .   Documentation: I have reviewed the above documentation for accuracy and completeness, and I agree with the above.  Armida Sans, MD

## 2023-10-03 ENCOUNTER — Encounter: Payer: Self-pay | Admitting: Dermatology

## 2023-10-03 ENCOUNTER — Telehealth: Payer: Self-pay

## 2023-10-03 LAB — HCG, SERUM, QUALITATIVE: hCG,Beta Subunit,Qual,Serum: NEGATIVE m[IU]/mL (ref <6)

## 2023-10-03 LAB — COMPREHENSIVE METABOLIC PANEL
ALT: 11 [IU]/L (ref 0–32)
AST: 14 [IU]/L (ref 0–40)
Albumin: 4.2 g/dL (ref 3.9–4.9)
Alkaline Phosphatase: 94 [IU]/L (ref 44–121)
BUN/Creatinine Ratio: 12 (ref 9–23)
BUN: 11 mg/dL (ref 6–20)
Bilirubin Total: 0.2 mg/dL (ref 0.0–1.2)
CO2: 21 mmol/L (ref 20–29)
Calcium: 9.2 mg/dL (ref 8.7–10.2)
Chloride: 104 mmol/L (ref 96–106)
Creatinine, Ser: 0.94 mg/dL (ref 0.57–1.00)
Globulin, Total: 3.5 g/dL (ref 1.5–4.5)
Glucose: 57 mg/dL — ABNORMAL LOW (ref 70–99)
Potassium: 3.8 mmol/L (ref 3.5–5.2)
Sodium: 140 mmol/L (ref 134–144)
Total Protein: 7.7 g/dL (ref 6.0–8.5)
eGFR: 83 mL/min/{1.73_m2} (ref >59)

## 2023-10-03 LAB — LIPID PANEL
Chol/HDL Ratio: 5.1 ratio — ABNORMAL HIGH (ref 0.0–4.4)
Cholesterol, Total: 173 mg/dL (ref 100–199)
HDL: 34 mg/dL — ABNORMAL LOW (ref >39)
LDL Chol Calc (NIH): 121 mg/dL — ABNORMAL HIGH (ref 0–99)
Triglycerides: 97 mg/dL (ref 0–149)
VLDL Cholesterol Cal: 18 mg/dL (ref 5–40)

## 2023-10-03 LAB — HEPATIC FUNCTION PANEL: Bilirubin, Direct: <0.08 mg/dL (ref 0.00–0.40)

## 2023-10-03 MED ORDER — ISOTRETINOIN 30 MG PO CAPS: ORAL_CAPSULE | ORAL | 0 refills | Status: DC

## 2023-10-03 NOTE — Telephone Encounter (Signed)
-----   Message from Armida Sans sent at 10/03/2023  1:50 PM EST ----- Lab from 10/02/2023 shows: Pregnancy test negative. Liver; kidney and Lipid tests unremarkable. May continue Isotretinoin for Acne.  Send Rx for Isotretinoin to pharmacy and inform pt.

## 2023-10-03 NOTE — Telephone Encounter (Signed)
Patient informed of lab results, confirmed in Ipledge, and medication sent in.

## 2023-10-04 ENCOUNTER — Other Ambulatory Visit: Payer: Self-pay | Admitting: Dermatology

## 2023-10-09 ENCOUNTER — Encounter: Payer: Self-pay | Admitting: Dermatology

## 2023-11-06 ENCOUNTER — Ambulatory Visit (INDEPENDENT_AMBULATORY_CARE_PROVIDER_SITE_OTHER): Payer: Medicaid Other | Admitting: Dermatology

## 2023-11-06 VITALS — Wt 185.0 lb

## 2023-11-06 DIAGNOSIS — L700 Acne vulgaris: Secondary | ICD-10-CM | POA: Diagnosis not present

## 2023-11-06 DIAGNOSIS — Z7189 Other specified counseling: Secondary | ICD-10-CM

## 2023-11-06 DIAGNOSIS — L853 Xerosis cutis: Secondary | ICD-10-CM | POA: Diagnosis not present

## 2023-11-06 DIAGNOSIS — K130 Diseases of lips: Secondary | ICD-10-CM | POA: Diagnosis not present

## 2023-11-06 DIAGNOSIS — Z79899 Other long term (current) drug therapy: Secondary | ICD-10-CM

## 2023-11-06 DIAGNOSIS — L309 Dermatitis, unspecified: Secondary | ICD-10-CM

## 2023-11-06 DIAGNOSIS — L2089 Other atopic dermatitis: Secondary | ICD-10-CM

## 2023-11-06 MED ORDER — MUPIROCIN 2 % EX OINT: 1.0000 | TOPICAL_OINTMENT | Freq: Two times a day (BID) | CUTANEOUS | 1 refills | Status: AC

## 2023-11-06 MED ORDER — ISOTRETINOIN 30 MG PO CAPS: ORAL_CAPSULE | ORAL | 0 refills | Status: DC

## 2023-11-06 NOTE — Patient Instructions (Signed)
Start Mupirocin oint twice a day to chin for 2 weeks After 2 weeks start over the counter Hydrocortisone cream once daily to chin until eczema resolves   Due to recent changes in healthcare laws, you may see results of your pathology and/or laboratory studies on MyChart before the doctors have had a chance to review them. We understand that in some cases there may be results that are confusing or concerning to you. Please understand that not all results are received at the same time and often the doctors may need to interpret multiple results in order to provide you with the best plan of care or course of treatment. Therefore, we ask that you please give [place] 2 business days to thoroughly review all your results before contacting the office for clarification. Should we see a critical lab result, you will be contacted sooner.   If You Need Anything After Your Visit  If you have any questions or concerns for your doctor, please call our main line at [phone] and press option 4 to reach your doctor's medical assistant. If no one answers, please leave a voicemail as directed and we will return your call as soon as possible. Messages left after 4 pm will be answered the following business day.   You may also send [place] a message via MyChart. We typically respond to MyChart messages within 1-2 business days.  For prescription refills, please ask your pharmacy to contact our office. Our fax number is [phone].  If you have an urgent issue when the clinic is closed that cannot wait until the next business day, you can page your doctor at the number below.    Please note that while we do our best to be available for urgent issues outside of office hours, we are not available 24/7.   If you have an urgent issue and are unable to reach [place], you may choose to seek medical care at your doctor's office, retail clinic, urgent care center, or emergency room.  If you have a medical emergency, please  immediately call 911 or go to the emergency department.  Pager Numbers  - Dr. Gwen Pounds: [phone]  - Dr. Roseanne Reno: [phone]  - Dr. Katrinka Blazing: [phone]   In the event of inclement weather, please call our main line at [phone] for an update on the status of any delays or closures.  Dermatology Medication Tips: Please keep the boxes that topical medications come in in order to help keep track of the instructions about where and how to use these. Pharmacies typically print the medication instructions only on the boxes and not directly on the medication tubes.   If your medication is too expensive, please contact our office at [phone] option 4 or send [place] a message through MyChart.   We are unable to tell what your co-pay for medications will be in advance as this is different depending on your insurance coverage. However, we may be able to find a substitute medication at lower cost or fill out paperwork to get insurance to cover a needed medication.   If a prior authorization is required to get your medication covered by your insurance company, please allow [place] 1-2 business days to complete this process.  Drug prices often vary depending on where the prescription is filled and some pharmacies may offer cheaper prices.  The website www.goodrx.com contains coupons for medications through different pharmacies. The prices here do not account for what the cost may be with help from insurance (it may be cheaper  with your insurance), but the website can give you the price if you did not use any insurance.  - You can print the associated coupon and take it with your prescription to the pharmacy.  - You may also stop by our office during regular business hours and pick up a GoodRx coupon card.  - If you need your prescription sent electronically to a different pharmacy, notify our office through [facility] or by phone at [phone] option 4.     Si Usted Necesita Algo  Despus de Su Visita  Tambin puede enviarnos un mensaje a travs de Clinical cytogeneticist. Por lo general respondemos a los mensajes de MyChart en el transcurso de 1 a 2 das hbiles.  Para renovar recetas, por favor pida a su farmacia que se ponga en contacto con nuestra oficina. Annie Sable de fax es [place] [phone].  Si tiene un asunto urgente cuando la clnica est cerrada y que no puede esperar hasta el siguiente da hbil, puede llamar/localizar a su doctor(a) al nmero que aparece a continuacin.   Por favor, tenga en cuenta que aunque hacemos todo lo posible para estar disponibles para asuntos urgentes fuera del horario de [place], no estamos disponibles las 24 horas del da, los 7 [address]  [address] de la [place].   Si tiene un problema urgente y no puede comunicarse con nosotros, puede optar por buscar atencin mdica  en el consultorio de su doctor(a), en una clnica privada, en un centro de atencin urgente o en una sala de emergencias.  Si tiene Engineer, drilling, por favor llame inmediatamente al 911 o vaya a la sala de emergencias.  Nmeros de bper  - Dr. Gwen Pounds: [phone]  - Dra. Roseanne Reno: [phone]  - Dr. Katrinka Blazing: [phone]   En caso de inclemencias del tiempo, por favor llame a Lacy Duverney principal al [phone] para una actualizacin sobre el [place] de cualquier retraso o cierre.  Consejos para la medicacin en dermatologa: Por favor, guarde las cajas en las que vienen los medicamentos de uso tpico para ayudarle a seguir las instrucciones sobre dnde y cmo usarlos. Las farmacias generalmente imprimen las instrucciones del medicamento slo en las cajas y no directamente en los tubos del [place].   Si su medicamento es muy caro, por favor, pngase en contacto con Rolm Gala llamando al [phone] y presione la opcin 4 o envenos un mensaje a travs de Clinical cytogeneticist.   No podemos decirle cul ser su copago por los medicamentos por adelantado ya que esto es diferente  dependiendo de la cobertura de su seguro. Sin embargo, es posible que podamos encontrar un medicamento sustituto a Audiological scientist un formulario para que el seguro cubra el medicamento que se considera necesario.   Si se requiere una autorizacin previa para que su compaa de seguros [place] su medicamento, por favor permtanos de 1 a 2 das hbiles para completar [address]  Los precios de los medicamentos varan con frecuencia dependiendo del Environmental consultant de dnde se surte la receta y alguna farmacias pueden ofrecer precios ms baratos.  El sitio web www.goodrx.com tiene cupones para medicamentos de Health and safety inspector. Los precios aqu no tienen en cuenta lo que podra costar con la ayuda del seguro (puede ser ms barato con su seguro), pero el sitio web puede darle el precio si no utiliz Tourist information centre manager.  - Puede imprimir el cupn correspondiente y llevarlo con su receta a la farmacia.  - Tambin puede pasar por nuestra oficina durante el horario de atencin regular y Education officer, museum  una tarjeta de cupones de GoodRx.  - Si necesita que su receta se enve electrnicamente a una farmacia diferente, informe a nuestra oficina a travs de MyChart de [place] o por telfono llamando al [phone] y presione la opcin 4.

## 2023-11-06 NOTE — Progress Notes (Signed)
Isotretinoin Follow-Up Visit   Subjective  Jody Perez is a 32 y.o. female who presents for the following: Isotretinoin follow-up and check spot chin, noticed lighter color area ~72m  Week # 20   Isotretinoin F/U - 11/06/23 0900       Isotretinoin Follow Up   iPledge # [id number]    Date 11/06/23    Weight 185 lb (83.9 kg)    Two Forms of Birth Control Injection;Female Condom    Acne breakouts since last visit? Yes      Dosage   Target Dosage (mg) 12990    Current (To Date) Dosage (mg) 6300    To Go Dosage (mg) 6690      Skin Side Effects   Dry Lips Yes    Nose bleeds No    Dry eyes No    Dry Skin No    Sunburn No      Gastrointestinal Side Effects   Nausea No    Diarrhea No    Blood in stool No      Neurological Side Effects   Blurred vision No    Depression No    Headache No    Homicidal thoughts No    Mood Changes No    Suicidal thoughts No      Constitutional Side Effects   Fatigue No      Musculoskeletal Side Effects   Muscle aches No      Other Side Effects   Other Side Effects light spot on chin      Labs Notes   Last labs done 10/02/23              Side effects: Dry skin, dry lips  Patient is not pregnant, not seeking pregnancy, and not breastfeeding.   The following portions of the chart were reviewed this encounter and updated as appropriate: medications, allergies, medical history  Review of Systems:  No other skin or systemic complaints except as noted in HPI or Assessment and Plan.  Objective  Well appearing patient in no apparent distress; mood and affect are within normal limits.  An examination of the face, neck, chest, and back was performed and relevant findings are noted below.      Assessment & Plan    ACNE VULGARIS Patient is currently on Isotretinoin requiring FDA mandated monthly evaluations and laboratory monitoring. Condition is currently not to goal (must reach target dose based on weight and also have clear  skin for 2 months prior to discontinuation in order to help prevent relapse)  Exam findings: Small paps chin area  Week # 20 Pharmacy [facility] [place]  iPLEDGE # [id number]  Total mg -  6,300mg  Total mg/kg - 75mg /kg Birth Control- Depo shot/female condoms   Continue Isotretinoin 30mg  2 po qd with fatty meal Start Mupirocin oint qd x 2 wks to chin  Urine pregnancy test performed in office today and was negative.  Patient demonstrates comprehension and confirms she will not get pregnant. Lot [id number] exp 03/18/2025  Patient confirmed in iPledge and isotretinoin sent to pharmacy.    Xerosis secondary to isotretinoin therapy - Continue emollients as directed - Xyzal (levocetirizine) once a day and fish oil 1 gram daily may also help with dryness   Cheilitis secondary to isotretinoin therapy - Continue lip balm as directed, Dr. Clayborne Artist Cortibalm recommended   Long term medication management (isotretinoin)  Patient is using long term (months to years) prescription medication  to control their dermatologic condition.  These medications require periodic  monitoring to evaluate for efficacy and side effects and may require periodic laboratory monitoring.  - While taking Isotretinoin and for 30 days after you finish the medication, do not get pregnant, do not share pills, do not donate blood. Isotretinoin is best absorbed when taken with a fatty meal. Isotretinoin can make you sensitive to the sun. Daily careful sun protection including sunscreen SPF 30+ when outdoors is recommended. Isotretinoin Counseling; Review and Contraception Counseling: Reviewed potential side effects of isotretinoin including xerosis, cheilitis, hepatitis, hyperlipidemia, and severe birth defects if taken by a pregnant woman.  Women on isotretinoin must be celibate (not having sex) or required to use at least 2 birth control methods to prevent pregnancy (unless patient is a female of non-child bearing potential).   Females of child-bearing potential must have monthly pregnancy tests while on isotretinoin and report through I-Pledge (FDA monitoring program). Reviewed reports of suicidal ideation in those with a history of depression while taking isotretinoin and reports of diagnosis of inflammatory bowl disease (IBD) while taking isotretinoin as well as the lack of evidence for a causal relationship between isotretinoin, depression and IBD. Patient advised to reach out with any questions or concerns. Patient advised not to share pills or donate blood while on treatment or for one month after completing treatment. All patient's considering Isotretinoin must read and understand and sign Isotretinoin Consent Form and be registered with I-Pledge.  ECZEMA chin Exam: hypopigmented scaly patch L chin  Treatment Plan: In 2 weeks start otc HC to chin qd until resolved   Follow-up in 30 days.  I, Ardis Rowan, RMA, am acting as scribe for Armida Sans, MD .   Documentation: I have reviewed the above documentation for accuracy and completeness, and I agree with the above.  Armida Sans, MD

## 2023-11-09 ENCOUNTER — Encounter: Payer: Self-pay | Admitting: Dermatology

## 2023-11-15 ENCOUNTER — Encounter: Payer: Self-pay | Admitting: Advanced Practice Midwife

## 2023-11-15 ENCOUNTER — Ambulatory Visit: Payer: Medicaid Other | Admitting: Advanced Practice Midwife

## 2023-11-15 DIAGNOSIS — Z113 Encounter for screening for infections with a predominantly sexual mode of transmission: Secondary | ICD-10-CM

## 2023-11-15 LAB — WET PREP FOR TRICH, YEAST, CLUE
Trichomonas Exam: NEGATIVE
Yeast Exam: NEGATIVE

## 2023-11-15 NOTE — Progress Notes (Addendum)
[facility] Department  STI clinic/screening visit [address] [place] [address] [phone]  Subjective:  Jody Perez is a 32 y.o. SBF vaper G6P3 female being seen today for an STI screening visit. The patient reports they do have symptoms.  Patient reports that they do not desire a pregnancy in the next year.   They reported they are not interested in discussing contraception today.    No LMP recorded. Patient has had an injection.  Patient has the following medical conditions:   Patient Active Problem List   Diagnosis Date Noted   Blurry vision 02/28/2022   Herniated disc, cervical 02/25/2022   Iron deficiency anemia, unspecified 02/25/2022   Hemorrhagic ovarian cyst 02/25/2022   Leukocytosis 02/25/2022   Incomplete miscarriage 01/01/22 01/24/2022   Trichomonas infection12/17/19 01/23/2022   Liposuction 08/2021 01/23/2022   Migraine without aura and without status migrainosus, not intractable 01/11/2021   Vapes nicotine containing substance 07/30/2020   Obesity BMI=29.9 04/07/2020   Recurrent vaginitis 06/06/2019   History of genital HSV 01/28/2013    Chief Complaint  Patient presents with   SEXUALLY TRANSMITTED DISEASE    Pt is here for STD screening and has symptoms    HPI  Patient reports "I feel runny" x 1 month; pt also stating she has an HSV outbreak and takes daily Acyclovir but still having outbreaks due to stress. LMP not with DMPA. Last sex 11/13/23 without condom; with current partner x 5 years; 1 partner in last 3 mo. Vapes currently. Last cigar 3 years ago. Last MJ 28. Last ETOH 02/2023 (6 shots liquor).   Does the patient using douching products? No  Last HIV test per patient/review of record was  Lab Results  Component Value Date   HMHIVSCREEN Negative - Validated 08/28/2018    Lab Results  Component Value Date   HIV Non Reactive 12/14/2021     Last HEPC test per patient/review of record was No results found for:  "HMHEPCSCREEN" No components found for: "HEPC"   Last HEPB test per patient/review of record was No components found for: "HMHEPBSCREEN" No components found for: "HEPC"   Patient reports last pap was: 03/30/22 neg    Component Value Date/Time   DIAGPAP  11/12/2018 0000    NEGATIVE FOR INTRAEPITHELIAL LESIONS OR MALIGNANCY.   DIAGPAP TRICHOMONAS VAGINALIS PRESENT. 11/12/2018 0000   ADEQPAP  11/12/2018 0000    Satisfactory for evaluation  endocervical/transformation zone component PRESENT.   Lab Results  Component Value Date   SPECADGYN Comment 03/30/2022   Result Date Procedure Results Follow-ups  03/30/2022 IGP, Aptima HPV DIAGNOSIS:: Comment Specimen adequacy:: Comment Clinician Provided ICD10: Comment Performed by:: Comment PAP Smear Comment: . Note:: Comment Test Methodology: Comment HPV Aptima: Negative   11/12/2018 Cytology - PAP Adequacy: Satisfactory for evaluation  endocervical/transformation zone component PRESENT. Diagnosis: NEGATIVE FOR INTRAEPITHELIAL LESIONS OR MALIGNANCY. Diagnosis: TRICHOMONAS VAGINALIS PRESENT. HPV: NOT DETECTED Material Submitted: CervicoVaginal Pap [ThinPrep Imaged]   01/30/2018 HM PAP SMEAR HM Pap smear: negative for intraepithelial lesion or malignancy   05/03/2016 Cytology - PAP CYTOLOGY - PAP: PAP RESULT   12/05/2013 Cytology - PAP      Screening for MPX risk: Does the patient have an unexplained rash? No Is the patient MSM? No Does the patient endorse multiple sex partners or anonymous sex partners? No Did the patient have close or sexual contact with a person diagnosed with MPX? No Has the patient traveled outside the [place] where MPX is endemic? No Is there a  high clinical suspicion for MPX-- evidenced by one of the following No  -Unlikely to be chickenpox  -Lymphadenopathy  -Rash that present in same phase of evolution on any given body part See flowsheet for further details and programmatic requirements.   Immunization history:   Immunization History  Administered Date(s) Administered   HPV 9-valent 11/04/2015, 08/28/2018   Tdap 07/30/2013, 09/27/2016, 09/18/2017     The following portions of the patient's history were reviewed and updated as appropriate: allergies, current medications, past medical history, past social history, past surgical history and problem list.  Objective:  There were no vitals filed for this visit.  Physical Exam Vitals and nursing note reviewed.  Constitutional:      Appearance: Normal appearance. She is obese.  HENT:     Head: Normocephalic and atraumatic.     Mouth/Throat:     Mouth: Mucous membranes are moist.     Pharynx: Oropharynx is clear. No oropharyngeal exudate or posterior oropharyngeal erythema.  Eyes:     Conjunctiva/sclera: Conjunctivae normal.  Pulmonary:     Effort: Pulmonary effort is normal.  Abdominal:     Palpations: Abdomen is soft. There is no mass.     Tenderness: There is no abdominal tenderness. There is no rebound.     Comments: Soft without masses or tenderness; fair tone  Genitourinary:    General: Normal vulva.     Exam position: Lithotomy position.     Pubic Area: No rash or pubic lice.      Labia:        Right: No rash or lesion.        Left: No rash or lesion.      Vagina: Vaginal discharge (white creamy leukorrhea, ph<4.5) present. No erythema, bleeding or lesions.     Cervix: No cervical motion tenderness, discharge, friability, lesion or erythema.     Uterus: Normal.      Adnexa: Right adnexa normal and left adnexa normal.     Rectum: Normal.     Comments: pH = <4.5 Lymphadenopathy:     Head:     Right side of head: No preauricular or posterior auricular adenopathy.     Left side of head: No preauricular or posterior auricular adenopathy.     Cervical: No cervical adenopathy.     Right cervical: No superficial, deep or posterior cervical adenopathy.    Left cervical: No superficial, deep or posterior cervical adenopathy.     Upper  Body:     Right upper body: No supraclavicular, axillary or epitrochlear adenopathy.     Left upper body: No supraclavicular, axillary or epitrochlear adenopathy.     Lower Body: No right inguinal adenopathy. No left inguinal adenopathy.  Skin:    General: Skin is warm and dry.     Findings: No rash.  Neurological:     Mental Status: She is alert and oriented to person, place, and time.   Pt declines chaperone for exam   Assessment and Plan:  Jody Perez is a 32 y.o. female presenting to the [facility] Department for STI screening  1. Screening examination for venereal disease (Primary) Treat wet mount per standing orders Immunization nurse consult  - WET PREP FOR TRICH, YEAST, CLUE - Chlamydia/Gonorrhea [facility] - Gonococcus culture   Patient accepted all screenings including oral, vaginal CT/GC and bloodwork for HIV/RPR, and wet prep. Patient meets criteria for HepB screening? Yes. Ordered? declines Patient meets criteria for HepC screening? Yes. Ordered? Pt declines  Treat wet prep per standing order Discussed time line for State Lab results and that patient will be called with positive results and encouraged patient to call if she had not heard in 2 weeks.  Counseled to return or seek care for continued or worsening symptoms Recommended repeat testing in 3 months with positive results. Recommended condom use with all sex  Patient is currently using Hormonal Contraception: Injection, Rings and Patches to prevent pregnancy.    No follow-ups on file.  Future Appointments  Date Time Provider Department Center  12/11/2023  9:45 AM Elie Goody, MD [facility] None    Alberteen Spindle, CNM

## 2023-11-15 NOTE — Progress Notes (Signed)
 Pt is here for STD screening , Wet prep results reviewed with pt, no treatment required per standing order. Condoms declined.Sonda Primes, RN.

## 2023-11-21 LAB — GONOCOCCUS CULTURE

## 2023-12-11 ENCOUNTER — Ambulatory Visit (INDEPENDENT_AMBULATORY_CARE_PROVIDER_SITE_OTHER): Payer: Medicaid Other | Admitting: Dermatology

## 2023-12-11 ENCOUNTER — Encounter: Payer: Self-pay | Admitting: Dermatology

## 2023-12-11 VITALS — Wt 185.0 lb

## 2023-12-11 DIAGNOSIS — L853 Xerosis cutis: Secondary | ICD-10-CM | POA: Diagnosis not present

## 2023-12-11 DIAGNOSIS — Z79899 Other long term (current) drug therapy: Secondary | ICD-10-CM | POA: Diagnosis not present

## 2023-12-11 DIAGNOSIS — K130 Diseases of lips: Secondary | ICD-10-CM | POA: Diagnosis not present

## 2023-12-11 DIAGNOSIS — L708 Other acne: Secondary | ICD-10-CM

## 2023-12-11 DIAGNOSIS — L700 Acne vulgaris: Secondary | ICD-10-CM

## 2023-12-11 DIAGNOSIS — Z7189 Other specified counseling: Secondary | ICD-10-CM

## 2023-12-11 MED ORDER — ISOTRETINOIN 30 MG PO CAPS: ORAL_CAPSULE | ORAL | 0 refills | Status: DC

## 2023-12-11 NOTE — Patient Instructions (Addendum)
 Start Winlevi  cream 2 times a day to chin for acne   Due to recent changes in healthcare laws, you may see results of your pathology and/or laboratory studies on MyChart before the doctors have had a chance to review them. We understand that in some cases there may be results that are confusing or concerning to you. Please understand that not all results are received at the same time and often the doctors may need to interpret multiple results in order to provide you with the best plan of care or course of treatment. Therefore, we ask that you please give us  2 business days to thoroughly review all your results before contacting the office for clarification. Should we see a critical lab result, you will be contacted sooner.   If You Need Anything After Your Visit  If you have any questions or concerns for your doctor, please call our main line at [phone] and press option 4 to reach your doctor's medical assistant. If no one answers, please leave a voicemail as directed and we will return your call as soon as possible. Messages left after 4 pm will be answered the following business day.   You may also send us  a message via MyChart. We typically respond to MyChart messages within 1-2 business days.  For prescription refills, please ask your pharmacy to contact our office. Our fax number is [phone].  If you have an urgent issue when the clinic is closed that cannot wait until the next business day, you can page your doctor at the number below.    Please note that while we do our best to be available for urgent issues outside of office hours, we are not available 24/7.   If you have an urgent issue and are unable to reach us , you may choose to seek medical care at your doctor's office, retail clinic, urgent care center, or emergency room.  If you have a medical emergency, please immediately call 911 or go to the emergency department.  Pager Numbers  - Dr. Hester: [phone]  -  Dr. Jackquline: [phone]  - Dr. Claudene: [phone]   In the event of inclement weather, please call our main line at [phone] for an update on the status of any delays or closures.  Dermatology Medication Tips: Please keep the boxes that topical medications come in in order to help keep track of the instructions about where and how to use these. Pharmacies typically print the medication instructions only on the boxes and not directly on the medication tubes.   If your medication is too expensive, please contact our office at [phone] option 4 or send us  a message through MyChart.   We are unable to tell what your co-pay for medications will be in advance as this is different depending on your insurance coverage. However, we may be able to find a substitute medication at lower cost or fill out paperwork to get insurance to cover a needed medication.   If a prior authorization is required to get your medication covered by your insurance company, please allow us  1-2 business days to complete this process.  Drug prices often vary depending on where the prescription is filled and some pharmacies may offer cheaper prices.  The website www.goodrx.com contains coupons for medications through different pharmacies. The prices here do not account for what the cost may be with help from insurance (it may be cheaper with your insurance), but the website can give you the price if you did not use  any insurance.  - You can print the associated coupon and take it with your prescription to the pharmacy.  - You may also stop by our office during regular business hours and pick up a GoodRx coupon card.  - If you need your prescription sent electronically to a different pharmacy, notify our office through [facility] or by phone at [phone] option 4.     Si Usted Necesita Algo Despus de Su Visita  Tambin puede enviarnos un mensaje a travs de Clinical Cytogeneticist. Por lo general respondemos a los  mensajes de MyChart en el transcurso de 1 a 2 das hbiles.  Para renovar recetas, por favor pida a su farmacia que se ponga en contacto con nuestra oficina. Randi lakes de fax es [place] [phone].  Si tiene un asunto urgente cuando la clnica est cerrada y que no puede esperar hasta el siguiente da hbil, puede llamar/localizar a su doctor(a) al nmero que aparece a continuacin.   Por favor, tenga en cuenta que aunque hacemos todo lo posible para estar disponibles para asuntos urgentes fuera del horario de [place], no estamos disponibles las 24 horas del da, los 7 [address]  [address] de la [place].   Si tiene un problema urgente y no puede comunicarse con nosotros, puede optar por buscar atencin mdica  en el consultorio de su doctor(a), en una clnica privada, en un centro de atencin urgente o en una sala de emergencias.  Si tiene engineer, drilling, por favor llame inmediatamente al 911 o vaya a la sala de emergencias.  Nmeros de bper  - Dr. Hester: [phone]  - Dra. Jackquline: [phone]  - Dr. Claudene: [phone]   En caso de inclemencias del tiempo, por favor llame a landry capes principal al [phone] para una actualizacin sobre el [place] de cualquier retraso o cierre.  Consejos para la medicacin en dermatologa: Por favor, guarde las cajas en las que vienen los medicamentos de uso tpico para ayudarle a seguir las instrucciones sobre dnde y cmo usarlos. Las farmacias generalmente imprimen las instrucciones del medicamento slo en las cajas y no directamente en los tubos del [place].   Si su medicamento es muy caro, por favor, pngase en contacto con landry rieger llamando al [phone] y presione la opcin 4 o envenos un mensaje a travs de Clinical Cytogeneticist.   No podemos decirle cul ser su copago por los medicamentos por adelantado ya que esto es diferente dependiendo de la cobertura de su seguro. Sin embargo, es posible que podamos encontrar un medicamento sustituto a  audiological scientist un formulario para que el seguro cubra el medicamento que se considera necesario.   Si se requiere una autorizacin previa para que su compaa de seguros cubra su medicamento, por favor permtanos de 1 a 2 das hbiles para completar este proceso.  Los precios de los medicamentos varan con frecuencia dependiendo del environmental consultant de dnde se surte la receta y alguna farmacias pueden ofrecer precios ms baratos.  El sitio web www.goodrx.com tiene cupones para medicamentos de health and safety inspector. Los precios aqu no tienen en cuenta lo que podra costar con la ayuda del seguro (puede ser ms barato con su seguro), pero el sitio web puede darle el precio si no utiliz tourist information centre manager.  - Puede imprimir el cupn correspondiente y llevarlo con su receta a la farmacia.  - Tambin puede pasar por nuestra oficina durante el horario de atencin regular y education officer, museum una tarjeta de cupones de GoodRx.  - Si necesita que su receta se enve electrnicamente  a [place], informe a nuestra oficina a travs de MyChart de [place] o por telfono llamando al [phone] y presione la opcin 4.

## 2023-12-11 NOTE — Progress Notes (Signed)
 Isotretinoin  Follow-Up Visit   Subjective  Jody Perez is a 33 y.o. female who presents for the following: Isotretinoin  follow-up, Isotretinoin30mg  2 po qd, still has bumps on chin Mupirocin  qd   Week # 24   Isotretinoin  F/U - 12/11/23 0900       Isotretinoin  Follow Up   iPledge # [id number]    Date 12/11/23    Weight 185 lb (83.9 kg)    Two Forms of Birth Control Injection;Female Condom    Acne breakouts since last visit? Yes      Dosage   Target Dosage (mg) 12990    Current (To Date) Dosage (mg) 8100    To Go Dosage (mg) 4890      Skin Side Effects   Dry Lips No    Nose bleeds No    Dry eyes No    Dry Skin No    Sunburn No      Gastrointestinal Side Effects   Nausea No    Diarrhea No    Blood in stool No      Neurological Side Effects   Blurred vision No    Depression No    Headache No    Homicidal thoughts No    Mood Changes No    Suicidal thoughts No      Constitutional Side Effects   Fatigue No      Musculoskeletal Side Effects   Muscle aches No      Labs Notes   Last labs done 10/02/23              Side effects: Dry skin, dry lips  Patient is not pregnant, not seeking pregnancy, and not breastfeeding.   The following portions of the chart were reviewed this encounter and updated as appropriate: medications, allergies, medical history  Review of Systems:  No other skin or systemic complaints except as noted in HPI or Assessment and Plan.  Objective  Well appearing patient in no apparent distress; mood and affect are within normal limits.  An examination of the face, neck, chest, and back was performed and relevant findings are noted below.     Assessment & Plan   ACNE VULGARIS   Related Medications ISOtretinoin  (ACCUTANE ) 30 MG capsule Take 2 capsules daily with fatty meal LONG-TERM USE OF HIGH-RISK MEDICATION   Related Medications ISOtretinoin  (ACCUTANE ) 30 MG capsule Take 2 capsules daily with fatty meal COUNSELING AND  COORDINATION OF CARE   OTHER ACNE    ACNE VULGARIS Patient is currently on Isotretinoin  requiring FDA mandated monthly evaluations and laboratory monitoring. Condition is currently not to goal (must reach target dose based on weight and also have clear skin for 2 months prior to discontinuation in order to help prevent relapse)  Exam findings: R zygoma, L periorbital inflamed paps, chin with scattered inflamed excoriated paps  Week # 24 Pharmacy [facility] [place] iPLEDGE # [id number]  Total mg -  8,100mg  Total mg/kg - 96.5mg /kg Birth Control- Depo shot/female condoms   Continue Isotretinoin  30mg  2 po qd with fatty meal. AST ALT TG normal at current dose (10/02/23 labs) Start Winlevi  bid aa chin, sample x 2 [id number] 02/2026. Patient will send message if winlevi  is helping and we will send a prescription  Urine pregnancy test performed in office today and was negative.  Patient demonstrates comprehension and confirms she will not get pregnant. Lot [id number] exp 04/22/2025  Patient confirmed in iPledge and isotretinoin  sent to pharmacy.   Isotretinoin  Counseling; Review and Contraception Counseling: Reviewed potential  side effects of isotretinoin  including xerosis, cheilitis, hepatitis, hyperlipidemia, and severe birth defects if taken by a pregnant woman.  Women on isotretinoin  must be celibate (not having sex) or required to use at least 2 birth control methods to prevent pregnancy (unless patient is a female of non-child bearing potential).  Females of child-bearing potential must have monthly pregnancy tests while on isotretinoin  and report through I-Pledge (FDA monitoring program). Reviewed reports of suicidal ideation in those with a history of depression while taking isotretinoin  and reports of diagnosis of inflammatory bowl disease (IBD) while taking isotretinoin  as well as the lack of evidence for a causal relationship between isotretinoin , depression and IBD. Patient advised to  reach out with any questions or concerns. Patient advised not to share pills or donate blood while on treatment or for one month after completing treatment. All patient's considering Isotretinoin  must read and understand and sign Isotretinoin  Consent Form and be registered with I-Pledge.  Xerosis secondary to isotretinoin  therapy - Continue emollients as directed - Xyzal (levocetirizine) once a day and fish oil 1 gram daily may also help with dryness   Cheilitis secondary to isotretinoin  therapy - Continue lip balm as directed, Dr. Horald Cortibalm recommended   Long term medication management (isotretinoin )  Patient is using long term (months to years) prescription medication  to control their dermatologic condition.  These medications require periodic monitoring to evaluate for efficacy and side effects and may require periodic laboratory monitoring.  - While taking Isotretinoin  and for 30 days after you finish the medication, do not get pregnant, do not share pills, do not donate blood. Isotretinoin  is best absorbed when taken with a fatty meal. Isotretinoin  can make you sensitive to the sun. Daily careful sun protection including sunscreen SPF 30+ when outdoors is recommended.  Follow-up in 30 days.  I, Grayce Saunas, RMA, am acting as scribe for Boneta Sharps, MD .   Documentation: I have reviewed the above documentation for accuracy and completeness, and I agree with the above.  Boneta Sharps, MD

## 2023-12-16 ENCOUNTER — Encounter: Payer: Self-pay | Admitting: Dermatology

## 2023-12-17 MED ORDER — WINLEVI 1 % EX CREA: TOPICAL_CREAM | CUTANEOUS | 5 refills | Status: AC

## 2023-12-19 ENCOUNTER — Telehealth: Payer: Self-pay

## 2023-12-19 NOTE — Telephone Encounter (Signed)
Jody Perez has been denied by patient's Medicaid.  Patient must try and fail two formulary options: Adapalene, Clindamycin Gel/Solution, Finacea Gel, Retin-A Cream.   Patient has only tried and failed Adapalene for formulary options. aw

## 2023-12-20 ENCOUNTER — Ambulatory Visit: Payer: Medicaid Other

## 2023-12-20 VITALS — BP 136/82 | Ht 64.0 in | Wt 182.0 lb

## 2023-12-20 DIAGNOSIS — Z30013 Encounter for initial prescription of injectable contraceptive: Secondary | ICD-10-CM | POA: Diagnosis not present

## 2023-12-20 DIAGNOSIS — Z3042 Encounter for surveillance of injectable contraceptive: Secondary | ICD-10-CM

## 2023-12-20 DIAGNOSIS — Z309 Encounter for contraceptive management, unspecified: Secondary | ICD-10-CM | POA: Diagnosis not present

## 2023-12-20 DIAGNOSIS — Z3009 Encounter for other general counseling and advice on contraception: Secondary | ICD-10-CM

## 2023-12-20 NOTE — Progress Notes (Signed)
13w 0d post depo. Patient reports having hormonal acne and is looking to switch BCM. Advised patient to schedule appt to speak with provider in order to find best [facility] for her at this time. Patient agrees and is okay with receiving injection today. Depo given today per order by Aliene Altes, FNP dated 06/21/2023. Tolerated well in R deltoid. Patient due for annual physical 01/26/2024; patient plans on speaking with provider about [facility] then. If patient wants to continue with depo, next depo due 03/06/2024.  Abagail Kitchens, RN

## 2023-12-22 ENCOUNTER — Encounter: Payer: Self-pay | Admitting: Dermatology

## 2023-12-24 MED ORDER — CLINDAMYCIN PHOSPHATE 1 % EX GEL: Freq: Two times a day (BID) | CUTANEOUS | 0 refills | Status: AC

## 2023-12-24 NOTE — Addendum Note (Signed)
Addended by: Dorathy Daft R on: 12/24/2023 08:06 AM   Modules accepted: Orders

## 2023-12-24 NOTE — Telephone Encounter (Signed)
RX change made and patient aware through MyChart. aw

## 2024-01-15 ENCOUNTER — Ambulatory Visit: Payer: Medicaid Other

## 2024-01-15 ENCOUNTER — Ambulatory Visit
Admission: RE | Admit: 2024-01-15 | Discharge: 2024-01-15 | Disposition: A | Discharge status: Home / Self Care | Payer: Medicaid Other | Source: Ambulatory Visit | Attending: Emergency Medicine | Admitting: Emergency Medicine

## 2024-01-15 VITALS — BP 119/77 | HR 93 | Temp 98.7°F | Resp 16

## 2024-01-15 DIAGNOSIS — N760 Acute vaginitis: Secondary | ICD-10-CM | POA: Insufficient documentation

## 2024-01-15 DIAGNOSIS — B3731 Acute candidiasis of vulva and vagina: Secondary | ICD-10-CM | POA: Diagnosis present

## 2024-01-15 DIAGNOSIS — B9689 Other specified bacterial agents as the cause of diseases classified elsewhere: Secondary | ICD-10-CM | POA: Insufficient documentation

## 2024-01-15 LAB — WET PREP, GENITAL
Sperm: NONE SEEN
Trich, Wet Prep: NONE SEEN
WBC, Wet Prep HPF POC: <10 (ref <10)

## 2024-01-15 LAB — CERVICOVAGINAL ANCILLARY ONLY
Chlamydia: NEGATIVE
Comment: NEGATIVE
Comment: NORMAL
Neisseria Gonorrhea: NEGATIVE

## 2024-01-15 MED ORDER — FLUCONAZOLE 150 MG PO TABS
150.0000 mg | ORAL_TABLET | ORAL | 0 refills | Status: AC
Start: 2024-01-15 — End: 2024-01-22

## 2024-01-15 MED ORDER — METRONIDAZOLE 500 MG PO TABS: 500.0000 mg | ORAL_TABLET | Freq: Two times a day (BID) | ORAL | 0 refills | Status: AC

## 2024-01-15 NOTE — ED Provider Notes (Signed)
[facility]    CSN: [id number] Arrival date & time: 01/15/24  4098      History   Chief Complaint Chief Complaint  Patient presents with   Exposure to STD    Entered by patient    HPI Jody Perez is a 33 y.o. female.   HPI  33 year old female with past medical history significant for recurrent BV, UTIs, and STIs presents with request to be tested for sexually transmitted diseases.  She is in a monogamous relationship and has unprotected sex with her partner.  To her knowledge he is monogamous with her as well.  She states that she has noticed a white vaginal discharge with a sweet, yeastlike odor.  She denies any pain with urination urinary urgency or frequency, blood in her urine or vaginal bleeding, low back pain, nausea, or vomiting.  Past Medical History:  Diagnosis Date   BV (bacterial vaginosis)    Chlamydia    Herpes genitalis in women    HSV infection    Migraines    Miscarriage    Trichomonas    UTI (lower urinary tract infection)     Patient Active Problem List   Diagnosis Date Noted   Blurry vision 02/28/2022   Herniated disc, cervical 02/25/2022   Iron deficiency anemia, unspecified 02/25/2022   Hemorrhagic ovarian cyst 02/25/2022   Leukocytosis 02/25/2022   Incomplete miscarriage 01/01/22 01/24/2022   Trichomonas infection12/17/19 01/23/2022   Liposuction 08/2021 01/23/2022   Migraine without aura and without status migrainosus, not intractable 01/11/2021   Vapes nicotine containing substance 07/30/2020   Obesity BMI=29.9 04/07/2020   Recurrent vaginitis 06/06/2019   History of genital HSV 01/28/2013    Past Surgical History:  Procedure Laterality Date   LIPOSUCTION  09/01/2021   WISDOM TOOTH EXTRACTION      OB History     Gravida  6   Para  3   Term  3   Preterm  0   AB  3   Living  3      SAB  3   IAB  0   Ectopic  0   Multiple  0   Live Births  3            Home Medications    Prior to Admission  medications   Medication Sig Start Date End Date Taking? Authorizing Provider  fluconazole (DIFLUCAN) 150 MG tablet Take 1 tablet (150 mg total) by mouth every 3 (three) days for 3 doses. 01/15/24 01/22/24 Yes Becky Augusta, NP  metroNIDAZOLE (FLAGYL) 500 MG tablet Take 1 tablet (500 mg total) by mouth 2 (two) times daily. 01/15/24  Yes Becky Augusta, NP  Clascoterone [facility]) 1 % CREA Apply to face BID. 12/17/23   Elie Goody, MD  clindamycin (CLINDAGEL) 1 % gel Apply topically 2 (two) times daily. 12/24/23 12/23/24  Elie Goody, MD  ISOtretinoin (ACCUTANE) 30 MG capsule Take 2 capsules daily with fatty meal 12/11/23   Elie Goody, MD  mupirocin ointment (BACTROBAN) 2 % Apply 1 Application topically 2 (two) times daily. qd to chin 11/06/23   Deirdre Evener, MD  tacrolimus (PROTOPIC) 0.1 % ointment Apply topically 2 (two) times daily. Patient not taking: Reported on 11/15/2023 10/02/23   Deirdre Evener, MD    Family History Family History  Problem Relation Age of Onset   Hypertension Mother    Cancer Mother        Jaclyn Prime   Healthy Father    Healthy Brother  Hypertension Brother    Cervical cancer Brother    Healthy Brother    Breast cancer Maternal Grandmother    Colon cancer Neg Hx    Ovarian cancer Neg Hx     Social History Social History   Tobacco Use   Smoking status: Every Day    Types: E-cigarettes, Cigars    Passive exposure: Never   Smokeless tobacco: Never  Vaping Use   Vaping status: Every Day   Substances: Nicotine, Flavoring  Substance Use Topics   Alcohol use: Not Currently    Alcohol/week: 6.0 standard drinks of alcohol    Types: 6 Shots of liquor per week    Comment: last use 02/2023   Drug use: Not Currently    Types: Marijuana    Comment: last use 28     Allergies   Patient has no known allergies.   Review of Systems Review of Systems  Constitutional:  Negative for fever.  Gastrointestinal:  Negative for abdominal pain,  nausea and vomiting.  Genitourinary:  Positive for vaginal discharge and vaginal pain. Negative for dysuria, frequency, hematuria, urgency and vaginal bleeding.  Musculoskeletal:  Negative for back pain.     Physical Exam Triage Vital Signs ED Triage Vitals  Encounter Vitals Group     BP      Systolic BP Percentile      Diastolic BP Percentile      Pulse      Resp      Temp      Temp src      SpO2      Weight      Height      Head Circumference      Peak Flow      Pain Score      Pain Loc      Pain Education      Exclude from Growth Chart    No data found.  Updated Vital Signs BP 119/77 (BP Location: Right Arm)   Pulse 93   Temp 98.7 F (37.1 C) (Oral)   Resp 16   SpO2 98%   Visual Acuity Right Eye Distance:   Left Eye Distance:   Bilateral Distance:    Right Eye Near:   Left Eye Near:    Bilateral Near:     Physical Exam Vitals and nursing note reviewed.  Constitutional:      Appearance: Normal appearance. She is not ill-appearing.  HENT:     Head: Normocephalic and atraumatic.  Skin:    General: Skin is warm and dry.     Capillary Refill: Capillary refill takes less than 2 seconds.     Findings: No rash.  Neurological:     General: No focal deficit present.     Mental Status: She is alert and oriented to person, place, and time.      UC Treatments / Results  Labs (all labs ordered are listed, but only abnormal results are displayed) Labs Reviewed  WET PREP, GENITAL - Abnormal; Notable for the following components:      Result Value   Yeast Wet Prep HPF POC PRESENT (*)    Clue Cells Wet Prep HPF POC PRESENT (*)    All other components within normal limits  CERVICOVAGINAL ANCILLARY ONLY    EKG   Radiology No results found.  Procedures Procedures (including critical care time)  Medications Ordered in UC Medications - No data to display  Initial Impression / Assessment and Plan / UC Course  I  have reviewed the triage vital signs  and the nursing notes.  Pertinent labs & imaging results that were available during my care of the patient were reviewed by me and considered in my medical decision making (see chart for details).   Patient is a nontoxic-appearing 33 year old female senting for evaluation of GYN symptoms as outlined HPI above.  She is requesting STI testing for signs of history of recurrent BV.  She denies any urinary symptoms, or abdominal pain.  She has had some mild discomfort in bilateral adnexal region intermittently.  I will her vaginal wet prep to evaluate for the presence of BV or yeast given that she is complaining of a white sweet smelling discharge.  I will also order an STI panel given that she has a concern for STIs though she has a monogamous partner.  Vaginal wet prep is positive for both yeast and clue cells.  Will discharge patient with a diagnosis of bacterial vaginosis and vaginal yeast infection and treat her with metronidazole 500 mg twice daily for 7 days and Diflucan 150 mg, 1 tablet today and repeat dosing every 3 days for total of 3 doses.  We did discuss using boric acid suppositories as maintenance therapy to prevent recurrence of BV and yeast.   Final Clinical Impressions(s) / UC Diagnoses   Final diagnoses:  Bacterial vaginosis  Vaginal yeast infection     Discharge Instructions      Take the Flagyl (metronidazole) 500 mg twice daily for treatment of your bacterial vaginosis.  Avoid alcohol while on the metronidazole as taken together will cause of vomiting.  Bacterial vaginosis is often caused by a imbalance of bacteria in your vaginal vault.  This is sometimes a result of using tampons or hormonal fluctuations during her menstrual cycle.  You if your symptoms are recurrent you can try using a boric acid suppository twice weekly to help maintain the acid-base balance in your vagina vault which could prevent further infection.  You can also try vaginal probiotics to help  return normal bacterial balance.   For your vaginal yeast infection take 1 Diflucan tablet now and repeat dosing every 3 days for total of 3 doses.  Your STI testing panel will be back in the next 1 to 2 days.  If you test positive for any infection you will be contacted by phone and treatment options will be provided.  If your results are negative they will appear in your MyChart.     ED Prescriptions     Medication Sig Dispense Auth. Provider   metroNIDAZOLE (FLAGYL) 500 MG tablet Take 1 tablet (500 mg total) by mouth 2 (two) times daily. 14 tablet Becky Augusta, NP   fluconazole (DIFLUCAN) 150 MG tablet Take 1 tablet (150 mg total) by mouth every 3 (three) days for 3 doses. 3 tablet Becky Augusta, NP      PDMP not reviewed this encounter.   Becky Augusta, NP 01/15/24 [phone]

## 2024-01-15 NOTE — Discharge Instructions (Addendum)
Take the Flagyl (metronidazole) 500 mg twice daily for treatment of your bacterial vaginosis.  Avoid alcohol while on the metronidazole as taken together will cause of vomiting.  Bacterial vaginosis is often caused by a imbalance of bacteria in your vaginal vault.  This is sometimes a result of using tampons or hormonal fluctuations during her menstrual cycle.  You if your symptoms are recurrent you can try using a boric acid suppository twice weekly to help maintain the acid-base balance in your vagina vault which could prevent further infection.  You can also try vaginal probiotics to help return normal bacterial balance.   For your vaginal yeast infection take 1 Diflucan tablet now and repeat dosing every 3 days for total of 3 doses.  Your STI testing panel will be back in the next 1 to 2 days.  If you test positive for any infection you will be contacted by phone and treatment options will be provided.  If your results are negative they will appear in your MyChart.

## 2024-01-15 NOTE — ED Triage Notes (Signed)
Patient presents to [facility] for lower abdominal pain, vaginal discharge, and odor x 2 days ago. No OTC meds for symptom relief.

## 2024-01-17 ENCOUNTER — Encounter: Payer: Self-pay | Admitting: Dermatology

## 2024-01-17 ENCOUNTER — Ambulatory Visit (INDEPENDENT_AMBULATORY_CARE_PROVIDER_SITE_OTHER): Payer: Medicaid Other | Admitting: Dermatology

## 2024-01-17 ENCOUNTER — Ambulatory Visit: Payer: Medicaid Other | Admitting: Dermatology

## 2024-01-17 VITALS — Wt 185.0 lb

## 2024-01-17 DIAGNOSIS — Z7189 Other specified counseling: Secondary | ICD-10-CM | POA: Diagnosis not present

## 2024-01-17 DIAGNOSIS — L853 Xerosis cutis: Secondary | ICD-10-CM | POA: Diagnosis not present

## 2024-01-17 DIAGNOSIS — L700 Acne vulgaris: Secondary | ICD-10-CM

## 2024-01-17 DIAGNOSIS — K130 Diseases of lips: Secondary | ICD-10-CM | POA: Diagnosis not present

## 2024-01-17 DIAGNOSIS — Z79899 Other long term (current) drug therapy: Secondary | ICD-10-CM

## 2024-01-17 MED ORDER — ISOTRETINOIN 40 MG PO CAPS: 80.0000 mg | ORAL_CAPSULE | Freq: Every day | ORAL | 0 refills | Status: DC

## 2024-01-17 NOTE — Patient Instructions (Signed)

## 2024-01-17 NOTE — Progress Notes (Signed)
Isotretinoin Follow-Up Visit   Subjective  Jody Perez is a 33 y.o. female who presents for the following: Isotretinoin follow-up Isotretinoin 30mg  2 po qd, Clindamycin didn't help bumps around the chin but they have improved  Week # 28   Isotretinoin F/U - 01/17/24 1200       Isotretinoin Follow Up   iPledge # [id number]    Date 01/17/24    Weight 185 lb (83.9 kg)    Two Forms of Birth Control Injection;Female Condom    Acne breakouts since last visit? No      Dosage   Target Dosage (mg) 12990    Current (To Date) Dosage (mg) 9900    To Go Dosage (mg) 3090      Skin Side Effects   Dry Lips Yes    Nose bleeds Yes    Dry eyes No    Dry Skin Yes    Sunburn No      Gastrointestinal Side Effects   Nausea No    Diarrhea No    Blood in stool No      Neurological Side Effects   Blurred vision No    Depression No    Headache No    Homicidal thoughts No    Mood Changes No    Suicidal thoughts No      Constitutional Side Effects   Fatigue No      Musculoskeletal Side Effects   Muscle aches No      Labs Notes   Last labs done 10/02/23              Side effects: Dry skin, dry lips  Patient is not pregnant, not seeking pregnancy, and not breastfeeding.   The following portions of the chart were reviewed this encounter and updated as appropriate: medications, allergies, medical history  Review of Systems:  No other skin or systemic complaints except as noted in HPI or Assessment and Plan.  Objective  Well appearing patient in no apparent distress; mood and affect are within normal limits.  An examination of the face, neck, chest, and back was performed and relevant findings are noted below.     Assessment & Plan     ACNE VULGARIS Patient is currently on Isotretinoin requiring FDA mandated monthly evaluations and laboratory monitoring. Condition is currently not to goal (must reach target dose based on weight and also have clear skin for 2 months  prior to discontinuation in order to help prevent relapse)  Exam findings: Spotty hyperpigmentation face  Week # 28 Pharmacy [facility] [place] iPLEDGE # [id number]  Total mg -  9,900mg  Total mg/kg - 120mg /kg Birth Control- Depo shot/female condoms   Continue isotretinoin increase to Isotretinoin 40mg  2 po qd with fatty meal, discussed may need several more months of treatment will discuss again next month.  Recommend 150 - 200 mg/kg  Urine pregnancy test performed in office today and was negative.  Patient demonstrates comprehension and confirms she will not get pregnant. Lot [id number] exp 04/22/2025  Patient confirmed in iPledge and isotretinoin sent to pharmacy.   Isotretinoin Counseling; Review and Contraception Counseling: Reviewed potential side effects of isotretinoin including xerosis, cheilitis, hepatitis, hyperlipidemia, and severe birth defects if taken by a pregnant woman.  Women on isotretinoin must be celibate (not having sex) or required to use at least 2 birth control methods to prevent pregnancy (unless patient is a female of non-child bearing potential).  Females of child-bearing potential must have monthly pregnancy tests while on isotretinoin and  report through I-Pledge (FDA monitoring program). Reviewed reports of suicidal ideation in those with a history of depression while taking isotretinoin and reports of diagnosis of inflammatory bowl disease (IBD) while taking isotretinoin as well as the lack of evidence for a causal relationship between isotretinoin, depression and IBD. Patient advised to reach out with any questions or concerns. Patient advised not to share pills or donate blood while on treatment or for one month after completing treatment. All patient's considering Isotretinoin must read and understand and sign Isotretinoin Consent Form and be registered with I-Pledge.  Xerosis secondary to isotretinoin therapy - Continue emollients as directed - Xyzal  (levocetirizine) once a day and fish oil 1 gram daily may also help with dryness   Cheilitis secondary to isotretinoin therapy - Continue lip balm as directed, Dr. Clayborne Artist Cortibalm recommended   Long term medication management (isotretinoin)  Patient is using long term (months to years) prescription medication  to control their dermatologic condition.  These medications require periodic monitoring to evaluate for efficacy and side effects and may require periodic laboratory monitoring.  - While taking Isotretinoin and for 30 days after you finish the medication, do not get pregnant, do not share pills, do not donate blood. Isotretinoin is best absorbed when taken with a fatty meal. Isotretinoin can make you sensitive to the sun. Daily careful sun protection including sunscreen SPF 30+ when outdoors is recommended.  Follow-up in 30 days.  I, Ardis Rowan, RMA, am acting as scribe for Armida Sans, MD .   Documentation: I have reviewed the above documentation for accuracy and completeness, and I agree with the above.  Armida Sans, MD

## 2024-01-21 ENCOUNTER — Encounter: Payer: Self-pay | Admitting: Dermatology

## 2024-02-06 DIAGNOSIS — F419 Anxiety disorder, unspecified: Secondary | ICD-10-CM | POA: Insufficient documentation

## 2024-02-12 ENCOUNTER — Ambulatory Visit (INDEPENDENT_AMBULATORY_CARE_PROVIDER_SITE_OTHER): Payer: Medicaid Other | Admitting: Dermatology

## 2024-02-12 ENCOUNTER — Encounter: Payer: Self-pay | Admitting: Dermatology

## 2024-02-12 ENCOUNTER — Encounter: Payer: Medicaid Other | Admitting: Obstetrics

## 2024-02-12 VITALS — Wt 185.0 lb

## 2024-02-12 DIAGNOSIS — L853 Xerosis cutis: Secondary | ICD-10-CM | POA: Diagnosis not present

## 2024-02-12 DIAGNOSIS — L700 Acne vulgaris: Secondary | ICD-10-CM

## 2024-02-12 DIAGNOSIS — Z79899 Other long term (current) drug therapy: Secondary | ICD-10-CM

## 2024-02-12 DIAGNOSIS — Z7189 Other specified counseling: Secondary | ICD-10-CM

## 2024-02-12 DIAGNOSIS — K130 Diseases of lips: Secondary | ICD-10-CM | POA: Diagnosis not present

## 2024-02-12 MED ORDER — ISOTRETINOIN 40 MG PO CAPS: 80.0000 mg | ORAL_CAPSULE | Freq: Every day | ORAL | 0 refills | Status: AC

## 2024-02-12 NOTE — Patient Instructions (Signed)

## 2024-02-12 NOTE — Progress Notes (Signed)
 Isotretinoin Follow-Up Visit   Subjective  Jody Perez is a 33 y.o. female who presents for the following: Isotretinoin follow-up, taking 40 mg qd, patient report a few breakouts and dry skin. Patient is pleased with her skin.   Week # 32   Isotretinoin F/U - 02/12/24 1000       Isotretinoin Follow Up   iPledge # [id number]    Date 02/12/24    Weight 185 lb (83.9 kg)    Two Forms of Birth Control Injection;Female Condom    Acne breakouts since last visit? No      Dosage   Target Dosage (mg) 12990    Current (To Date) Dosage (mg) 12300    To Go Dosage (mg) 690      Skin Side Effects   Dry Lips Yes    Nose bleeds Yes    Dry eyes No    Dry Skin Yes    Sunburn No      Gastrointestinal Side Effects   Nausea No    Diarrhea No    Blood in stool No      Neurological Side Effects   Blurred vision No    Depression No    Headache No    Homicidal thoughts No    Mood Changes No    Suicidal thoughts No      Constitutional Side Effects   Fatigue No      Musculoskeletal Side Effects   Muscle aches No               Side effects: Dry skin, dry lips  Patient is not pregnant, not seeking pregnancy, and not breastfeeding.   The following portions of the chart were reviewed this encounter and updated as appropriate: medications, allergies, medical history  Review of Systems:  No other skin or systemic complaints except as noted in HPI or Assessment and Plan.  Objective  Well appearing patient in no apparent distress; mood and affect are within normal limits.  An examination of the face, neck, chest, and back was performed and relevant findings are noted below.     Assessment & Plan    ACNE VULGARIS Patient is currently on Isotretinoin requiring FDA mandated monthly evaluations and laboratory monitoring. Condition is currently not to goal (must reach target dose based on weight and also have clear skin for 2 months prior to discontinuation in order to help  prevent relapse)   Exam findings: Spotty hyperpigmentation face   Week # [address] Pharmacy [facility] [place] [facility] iPLEDGE # [id number]  Total mg -  12,300 mg Total mg/kg - 147 mg/kg Birth Control- Depo shot/female condoms    Continue isotretinoin increase to Isotretinoin 40mg  2 po qd with fatty meal, discussed this may be the last month of treatment, Recommend 150 - 200 mg/kg   Urine pregnancy test performed in office today and was negative.  Patient demonstrates comprehension and confirms she will not get pregnant. Lot [id number] exp 04/22/2025   Patient confirmed in iPledge and isotretinoin sent to pharmacy.    Isotretinoin Counseling; Review and Contraception Counseling: Reviewed potential side effects of isotretinoin including xerosis, cheilitis, hepatitis, hyperlipidemia, and severe birth defects if taken by a pregnant woman.  Women on isotretinoin must be celibate (not having sex) or required to use at least 2 birth control methods to prevent pregnancy (unless patient is a female of non-child bearing potential).  Females of child-bearing potential must have monthly pregnancy tests while on isotretinoin and report through I-Pledge (FDA monitoring program).  Reviewed reports of suicidal ideation in those with a history of depression while taking isotretinoin and reports of diagnosis of inflammatory bowl disease (IBD) while taking isotretinoin as well as the lack of evidence for a causal relationship between isotretinoin, depression and IBD. Patient advised to reach out with any questions or concerns. Patient advised not to share pills or donate blood while on treatment or for one month after completing treatment. All patient's considering Isotretinoin must read and understand and sign Isotretinoin Consent Form and be registered with I-Pledge.     Xerosis secondary to isotretinoin therapy - Continue emollients as directed - Xyzal (levocetirizine) once a day and fish oil 1 gram daily  may also help with dryness   Cheilitis secondary to isotretinoin therapy - Continue lip balm as directed, Dr. Clayborne Artist Cortibalm recommended   Long term medication management (isotretinoin).  Patient is using long term (months to years) prescription medication  to control their dermatologic condition.  These medications require periodic monitoring to evaluate for efficacy and side effects and may require periodic laboratory monitoring.  - While taking Isotretinoin and for 30 days after you finish the medication, do not get pregnant, do not share pills, do not donate blood. Isotretinoin is best absorbed when taken with a fatty meal. Isotretinoin can make you sensitive to the sun. Daily careful sun protection including sunscreen SPF 30+ when outdoors is recommended.  Follow-up in 5-6 weeks.  IAngelique Holm, CMA, am acting as scribe for Armida Sans, MD .   Documentation: I have reviewed the above documentation for accuracy and completeness, and I agree with the above.  Armida Sans, MD

## 2024-03-07 ENCOUNTER — Encounter: Payer: Self-pay | Admitting: Family Medicine

## 2024-03-07 ENCOUNTER — Ambulatory Visit: Admitting: Family Medicine

## 2024-03-07 VITALS — BP 131/89 | HR 86 | Ht 64.0 in | Wt 183.2 lb

## 2024-03-07 DIAGNOSIS — Z30013 Encounter for initial prescription of injectable contraceptive: Secondary | ICD-10-CM | POA: Diagnosis not present

## 2024-03-07 DIAGNOSIS — Z113 Encounter for screening for infections with a predominantly sexual mode of transmission: Secondary | ICD-10-CM

## 2024-03-07 DIAGNOSIS — Z3042 Encounter for surveillance of injectable contraceptive: Secondary | ICD-10-CM

## 2024-03-07 DIAGNOSIS — Z309 Encounter for contraceptive management, unspecified: Secondary | ICD-10-CM | POA: Diagnosis not present

## 2024-03-07 LAB — WET PREP FOR TRICH, YEAST, CLUE
Trichomonas Exam: NEGATIVE
Yeast Exam: NEGATIVE

## 2024-03-07 NOTE — Progress Notes (Signed)
   [facility]  [facility]  Subjective:  Jody Perez is a 33 y.o. being seen today for   Chief Complaint  Patient presents with   Contraception    HPI  Patient presents today for depo and reports vaginal itching. States she has been on Accutane x 1 year, and since then has had BV several times.     Health Maintenance Due  Topic Date Due   COVID-19 Vaccine (1) Never done   Hepatitis C Screening  Never done   Pneumococcal Vaccine 36-30 Years old (1 of 2 - PCV) Never done   HPV VACCINES (3 - Risk 3-dose series) 12/29/2018    ROS  The following portions of the patient's history were reviewed and updated as appropriate: allergies, current medications, past family history, past medical history, past social history, past surgical history and problem list. Problem list updated.   See flowsheet for other program required questions.  Objective:   Vitals:   03/07/24 0900  BP: 131/89  Pulse: 86  Weight: 183 lb 3.2 oz (83.1 kg)  Height: 5\' 4"  (1.626 m)    Physical Exam Exam conducted with a chaperone present Charlyne Mom).  Genitourinary:    General: Normal vulva.     Exam position: Lithotomy position.     Tanner stage (genital): 5.     Labia:        Right: No tenderness or lesion.        Left: No tenderness or lesion.      Vagina: Vaginal discharge present.     Cervix: Normal. No cervical motion tenderness.     Rectum: Normal.     Comments: Small amt of white discharge present  pH= 4      Assessment and Plan:  Jody Perez is a 33 y.o. female presenting to the [facility] Department for a Women's Health problem visit  1. Screening examination for venereal disease (Primary) -RN to give tip sheet on BV -reviewed options to prevent BV, pt wonders if this is related to her BCM, or accutane -has 1 month left on accutane, after discussion, decided she will see what happens after stopping accutane,  and then consider changing BCM if she continues to have BV  - Chlamydia/Gonorrhea [facility] - WET PREP FOR TRICH, YEAST, CLUE  2. Encounter for Depo-Provera contraception -depo given by RN     Return if symptoms worsen or fail to improve, for STI screening.  Future Appointments  Date Time Provider Department Center  03/18/2024  3:15 PM Julieanne Manson, MD [facility] None  03/19/2024 10:15 AM Deirdre Evener, MD [facility] None    Lenice Llamas, [place]

## 2024-03-07 NOTE — Progress Notes (Signed)
 Pt is here for depo and sti screening.  Wet prep reviewed with pt. Gaspar Garbe, RN

## 2024-03-10 ENCOUNTER — Telehealth: Admitting: Nurse Practitioner

## 2024-03-10 DIAGNOSIS — A6000 Herpesviral infection of urogenital system, unspecified: Secondary | ICD-10-CM

## 2024-03-10 MED ORDER — ACYCLOVIR 400 MG PO TABS
400.0000 mg | ORAL_TABLET | Freq: Three times a day (TID) | ORAL | 0 refills | Status: AC
Start: 2024-03-10 — End: 2024-03-15

## 2024-03-10 NOTE — Progress Notes (Signed)
 Virtual Visit Consent   Jody Perez, you are scheduled for a virtual visit with a [facility] provider today. Just as with appointments in the office, your consent must be obtained to participate. Your consent will be active for this visit and any virtual visit you may have with one of our providers in the next 365 days. If you have a MyChart account, a copy of this consent can be sent to you electronically.  As this is a virtual visit, video technology does not allow for your provider to perform a traditional examination. This may limit your provider's ability to fully assess your condition. If your provider identifies any concerns that need to be evaluated in person or the need to arrange testing (such as labs, EKG, etc.), we will make arrangements to do so. Although advances in technology are sophisticated, we cannot ensure that it will always work on either your end or our end. If the connection with a video visit is poor, the visit may have to be switched to a telephone visit. With either a video or telephone visit, we are not always able to ensure that we have a secure connection.  By engaging in this virtual visit, you consent to the provision of healthcare and authorize for your insurance to be billed (if applicable) for the services provided during this visit. Depending on your insurance coverage, you may receive a charge related to this service.  I need to obtain your verbal consent now. Are you willing to proceed with your visit today? Jody Perez has provided verbal consent on 03/10/2024 for a virtual visit (video or telephone). Mardene Shake, FNP  Date: 03/10/2024 5:18 PM   Virtual Visit via Video Note   I, Mardene Shake, connected with  Jody Perez  ([id number], 1991/07/28) on 03/10/24 at  5:30 PM EDT by a video-enabled telemedicine application and verified that I am speaking with the correct person using two identifiers.  Location: Patient: Virtual Visit Location Patient:  Home Provider: Virtual Visit Location Provider: Home Office   I discussed the limitations of evaluation and management by telemedicine and the availability of in person appointments. The patient expressed understanding and agreed to proceed.    History of Present Illness: Jody Perez is a 33 y.o. 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   Blurry vision 02/28/2022   Herniated disc, cervical 02/25/2022   Iron deficiency anemia, unspecified 02/25/2022   Hemorrhagic ovarian cyst 02/25/2022   Leukocytosis 02/25/2022   Incomplete miscarriage 01/01/22 01/24/2022   Trichomonas infection12/17/19 01/23/2022   Liposuction 08/2021 01/23/2022   Migraine without aura and without status migrainosus, not intractable 01/11/2021   Vapes nicotine containing substance 07/30/2020   Obesity BMI=29.9 04/07/2020   Recurrent vaginitis 06/06/2019   History of genital HSV 01/28/2013    Allergies: No Known Allergies Medications:  Current Outpatient Medications:    Clascoterone (WINLEVI) 1 % CREA, Apply to face BID., Disp: 60 g, Rfl: 5   clindamycin (CLINDAGEL) 1 % gel, Apply topically 2 (two) times daily., Disp: 60 g, Rfl: 0   ISOtretinoin (ACCUTANE) 40 MG capsule, Take 2 capsules (80 mg total) by  mouth daily. Take with fatty meal, Disp: 60 capsule, Rfl: 0   metroNIDAZOLE (FLAGYL) 500 MG tablet, Take 1 tablet (500 mg total) by mouth 2 (two) times daily., Disp: 14 tablet, Rfl: 0   mupirocin ointment (BACTROBAN) 2 %, Apply 1 Application topically 2 (two) times daily. qd to chin, Disp: 22 g, Rfl: 1   tacrolimus (PROTOPIC) 0.1 % ointment, Apply topically 2 (two) times daily. (Patient  not taking: Reported on 11/15/2023), Disp: 30 g, Rfl: 0   valACYclovir (VALTREX) 500 MG tablet, Take 500 mg by mouth daily., Disp: , Rfl:   Observations/Objective: Patient is well-developed, well-nourished in no acute distress.  Resting comfortably  at home.  Head is normocephalic, atraumatic.  No labored breathing.  Speech is clear and coherent with logical content.  Patient is alert and oriented at baseline.    Assessment and Plan:  1. Genital herpes simplex, unspecified site   Meds ordered this encounter  Medications   acyclovir (ZOVIRAX) 400 MG tablet    Sig: Take 1 tablet (400 mg total) by mouth 3 (three) times daily for 5 days.    Dispense:  15 tablet    Refill:  0    Follow Up Instructions: I discussed the assessment and treatment plan with the patient. The patient was provided an opportunity to ask questions and all were answered. The patient agreed with the plan and demonstrated an understanding of the instructions.  A copy of instructions were sent to the patient via MyChart unless otherwise noted below.    The patient was advised to call back or seek an in-person evaluation if the symptoms worsen or if the condition fails to improve as anticipated.    Mardene Shake, FNP

## 2024-03-18 ENCOUNTER — Encounter: Admitting: Obstetrics

## 2024-03-18 NOTE — Progress Notes (Deleted)
 GYNECOLOGY: ANNUAL EXAM   Subjective:    PCP: Healthcare, Jody Perez is a 33 y.o. female [phone] who presents for annual wellness visit.   Well Woman Visit:  GYN HISTORY:  No LMP recorded. Patient has had an injection.     Menstrual History: OB History     Gravida  6   Para  3   Term  3   Preterm  0   AB  3   Living  3      SAB  3   IAB  0   Ectopic  0   Multiple  0   Live Births  3           Menarche age: *** No LMP recorded. Patient has had an injection.     Periods are every *** days, and last *** days, flow is light / moderate / heavy.  Uses pads / tampons / menstrual cup and changes it every *** hours.  Cramping is mild / moderate / severe.  Cyclic symptoms include: {symptoms; gyn cyclic:13153}.  Intermenstrual bleeding, spotting, or discharge? *** Urinary incontinence? ***  Sexually active: *** Number of sexual partners: *** Gender of sexual Partners: *** Social History   Substance and Sexual Activity  Sexual Activity Yes   Partners: Male   Birth control/protection: Injection   Contraceptive methods: {PLAN CONTRACEPTION:313102} Dyspareunia? *** STI history: chlamydia STI/HIV testing or immunizations needed? {yes AV:409811}   Health Maintenance: -Last pap: approximate date 03/30/22 and was normal  --> Any abnormals: *** -FMH of Breast / Colon / Cervical cancer: *** -Vaccines:  Immunization History  Administered Date(s) Administered   HPV 9-valent 11/04/2015, 08/28/2018   Tdap 07/30/2013, 09/27/2016, 09/18/2017   Last Tdap: utd / Flu: *** / COVID: *** / Gardasil: utd / Shingles (50+): *** / PCV20:  -Hep C screen: *** -Last lipid / glucose screening: pcp  > Exercise: {misc; exercise types:16438}, {exercise level:31265} > Dietary Supplements: Folate: {yes/no:20286};  Calcium : {yes/no:20286}}; Vitamin D: {yes/no:20286} > There is no height or weight on file to calculate BMI.  > Recent dental visit {yes no:314532} > Seat  Belt Use: {yes no:314532} > Texting and driving? {yes no:314532} > Guns in the house {yes no:314532} > Recreational or other drug use: {Drug Use:32241}   Social History   Tobacco Use   Smoking status: Every Day    Types: E-cigarettes, Cigars    Passive exposure: Never   Smokeless tobacco: Never  Substance Use Topics   Alcohol use: Not Currently    Alcohol/week: 6.0 standard drinks of alcohol    Types: 6 Shots of liquor per week    Comment: last use 02/2023   Occupation: ***    Lives with: ***    PHQ-2 Score: In last two weeks, how often have you felt: Little interest or pleasure in doing things: {PHQGADfrequency:29690::"Not at all (0)"} Feeling down, depressed or hopeless: {PHQGADfrequency:29690::"Not at all (0)"} Score:   GAD-2 Over the last 2 weeks, how often have you been bothered by the following problems? Feeling nervous, anxious or on edge: {PHQGADfrequency:29690::"Not at all (0)"} Not being able to stop or control worrying: {PHQGADfrequency:29690::"Not at all (0)"}} Score: _________________________________________________________  Current Outpatient Medications  Medication Sig Dispense Refill   Clascoterone  (WINLEVI ) 1 % CREA Apply to face BID. 60 g 5   clindamycin  (CLINDAGEL) 1 % gel Apply topically 2 (two) times daily. 60 g 0   metroNIDAZOLE  (FLAGYL ) 500 MG tablet Take 1 tablet (500 mg total) by mouth 2 (two)  times daily. 14 tablet 0   mupirocin  ointment (BACTROBAN ) 2 % Apply 1 Application topically 2 (two) times daily. qd to chin 22 g 1   tacrolimus  (PROTOPIC ) 0.1 % ointment Apply topically 2 (two) times daily. (Patient not taking: Reported on 11/15/2023) 30 g 0   valACYclovir  (VALTREX ) 500 MG tablet Take 500 mg by mouth daily.     No current facility-administered medications for this visit.   No Known Allergies  Past Medical History:  Diagnosis Date   BV (bacterial vaginosis)    Chlamydia    Herpes genitalis in women    HSV infection    Migraines     Miscarriage    Trichomonas    UTI (lower urinary tract infection)    Past Surgical History:  Procedure Laterality Date   LIPOSUCTION  09/01/2021   WISDOM TOOTH EXTRACTION      Review Of Systems  Constitutional: Denied constitutional symptoms, night sweats, recent illness, fatigue, fever, insomnia and weight loss.  Eyes: Denied eye symptoms, eye pain, photophobia, vision change and visual disturbance.  Ears/Nose/Throat/Neck: Denied ear, nose, throat or neck symptoms, hearing loss, nasal discharge, sinus congestion and sore throat.  Cardiovascular: Denied cardiovascular symptoms, arrhythmia, chest pain/pressure, edema, exercise intolerance, orthopnea and palpitations.  Respiratory: Denied pulmonary symptoms, asthma, pleuritic pain, productive sputum, cough, dyspnea and wheezing.  Gastrointestinal: Denied, gastro-esophageal reflux, melena, nausea and vomiting.  Genitourinary:*** Denied genitourinary symptoms including symptomatic vaginal discharge, pelvic relaxation issues, and urinary complaints.  Musculoskeletal: Denied musculoskeletal symptoms, stiffness, swelling, muscle weakness and myalgia.  Dermatologic: Denied dermatology symptoms, rash and scar.  Neurologic: Denied neurology symptoms, dizziness, headache, neck pain and syncope.  Psychiatric: Denied psychiatric symptoms, anxiety and depression.  Endocrine: Denied endocrine symptoms including hot flashes and night sweats.      Objective:    There were no vitals taken for this visit.  Constitutional: Well-developed, well-nourished female in no acute distress Neurological: Alert and oriented to person, place, and time Psychiatric: Mood and affect appropriate Skin: No rashes or lesions Neck: Supple without masses. Trachea is midline.Thyroid  is normal size without masses Lymphatics: No cervical, axillary, supraclavicular, or inguinal adenopathy noted Respiratory: Clear to auscultation bilaterally. Good air movement with normal  work of breathing. Cardiovascular: Regular rate and rhythm. Extremities grossly normal, nontender with no edema; pulses regular Gastrointestinal: Soft, nontender, nondistended. No masses or hernias appreciated. No hepatosplenomegaly. No fluid wave. No rebound or guarding. Breast Exam: {Exam; breast:13139::"normal appearance, no masses or tenderness"} Genitourinary:         External Genitalia: Normal female genitalia    Vagina: Normal mucosa, no lesions.    Cervix: No lesions, normal size and consistency; no cervical motion tenderness; non-friable; Pap not***obtained.    Uterus: Normal size and contour; smooth, mobile, NT, {Desc; anteverted/retroverted/midposition:60613}. Adnexae: Non-palpable and non-tender Perineum/Anus: No lesions Rectal: deferred    Assessment/Plan:    Jody Perez is a 33 y.o. female [phone] with normal well-woman gynecologic exam.  -Screenings:  Pap: done with cotesting today  *** w/rflx today Mammogram: ordered***due *** Colon: ordered colonoscopy***Cologuard -OR- due *** Labs: ***A1C, CMP, HepC, Lipid panel, Vit D, TSH GAD***PHQ-2 = ***, discussed coping techniques; follow up with PCP if worsens or develops concern -Contraception: *** -Vaccines: UTD, Tdap today; pt declines Influenza. Gardasil: series not started, declined today.  -Healthy lifestyle modifications discussed: multivitamin, diet, exercise, sunscreen, tobacco and alcohol use. Emphasized importance of regular physical activity.  -Folate***Calcium  and Vit D recommendation reviewed.  -All questions answered to patient's satisfaction.  -RTC 1 yr  for annual, sooner prn.   No follow-ups on file.    Sofia Dunn, DO [facility]

## 2024-03-19 ENCOUNTER — Encounter: Payer: Self-pay | Admitting: Dermatology

## 2024-03-19 ENCOUNTER — Ambulatory Visit: Admitting: Dermatology

## 2024-03-19 VITALS — Wt 185.0 lb

## 2024-03-19 DIAGNOSIS — K130 Diseases of lips: Secondary | ICD-10-CM | POA: Diagnosis not present

## 2024-03-19 DIAGNOSIS — L700 Acne vulgaris: Secondary | ICD-10-CM

## 2024-03-19 DIAGNOSIS — L853 Xerosis cutis: Secondary | ICD-10-CM

## 2024-03-19 DIAGNOSIS — Z79899 Other long term (current) drug therapy: Secondary | ICD-10-CM

## 2024-03-19 DIAGNOSIS — Z7189 Other specified counseling: Secondary | ICD-10-CM

## 2024-03-19 MED ORDER — ISOTRETINOIN 40 MG PO CAPS
80.0000 mg | ORAL_CAPSULE | Freq: Every day | ORAL | 0 refills | Status: AC
Start: 2024-03-19 — End: 2024-04-18

## 2024-03-19 NOTE — Patient Instructions (Addendum)
While taking Isotretinoin and for 30 days after you finish the medication, do not get pregnant, do not share pills, do not donate blood.  Generic isotretinoin is best absorbed when taken with a fatty meal. Isotretinoin can make you sensitive to the sun. Daily careful sun protection including sunscreen SPF 30+ when outdoors is recommended.     Due to recent changes in healthcare laws, you may see results of your pathology and/or laboratory studies on MyChart before the doctors have had a chance to review them. We understand that in some cases there may be results that are confusing or concerning to you. Please understand that not all results are received at the same time and often the doctors may need to interpret multiple results in order to provide you with the best plan of care or course of treatment. Therefore, we ask that you please give [place] 2 business days to thoroughly review all your results before contacting the office for clarification. Should we see a critical lab result, you will be contacted sooner.   If You Need Anything After Your Visit  If you have any questions or concerns for your doctor, please call our main line at [phone] and press option 4 to reach your doctor's medical assistant. If no one answers, please leave a voicemail as directed and we will return your call as soon as possible. Messages left after 4 pm will be answered the following business day.   You may also send [place] a message via MyChart. We typically respond to MyChart messages within 1-2 business days.  For prescription refills, please ask your pharmacy to contact our office. Our fax number is [phone].  If you have an urgent issue when the clinic is closed that cannot wait until the next business day, you can page your doctor at the number below.    Please note that while we do our best to be available for urgent issues outside of office hours, we are not available 24/7.   If you have an urgent issue  and are unable to reach [place], you may choose to seek medical care at your doctor's office, retail clinic, urgent care center, or emergency room.  If you have a medical emergency, please immediately call 911 or go to the emergency department.  Pager Numbers  - Dr. Gwen Pounds: [phone]  - Dr. Roseanne Reno: [phone]  - Dr. Katrinka Blazing: [phone]   In the event of inclement weather, please call our main line at [phone] for an update on the status of any delays or closures.  Dermatology Medication Tips: Please keep the boxes that topical medications come in in order to help keep track of the instructions about where and how to use these. Pharmacies typically print the medication instructions only on the boxes and not directly on the medication tubes.   If your medication is too expensive, please contact our office at [phone] option 4 or send [place] a message through MyChart.   We are unable to tell what your co-pay for medications will be in advance as this is different depending on your insurance coverage. However, we may be able to find a substitute medication at lower cost or fill out paperwork to get insurance to cover a needed medication.   If a prior authorization is required to get your medication covered by your insurance company, please allow [place] 1-2 business days to complete this process.  Drug prices often vary depending on where the prescription is filled and some pharmacies may offer cheaper  prices.  The website www.goodrx.com contains coupons for medications through different pharmacies. The prices here do not account for what the cost may be with help from insurance (it may be cheaper with your insurance), but the website can give you the price if you did not use any insurance.  - You can print the associated coupon and take it with your prescription to the pharmacy.  - You may also stop by our office during regular business hours and pick up a GoodRx coupon card.  - If you  need your prescription sent electronically to a different pharmacy, notify our office through [facility] or by phone at [phone] option 4.     Si Usted Necesita Algo Despus de Su Visita  Tambin puede enviarnos un mensaje a travs de Clinical cytogeneticist. Por lo general respondemos a los mensajes de MyChart en el transcurso de 1 a 2 das hbiles.  Para renovar recetas, por favor pida a su farmacia que se ponga en contacto con nuestra oficina. Annie Sable de fax es [place] [phone].  Si tiene un asunto urgente cuando la clnica est cerrada y que no puede esperar hasta el siguiente da hbil, puede llamar/localizar a su doctor(a) al nmero que aparece a continuacin.   Por favor, tenga en cuenta que aunque hacemos todo lo posible para estar disponibles para asuntos urgentes fuera del horario de [place], no estamos disponibles las 24 horas del da, los 7 [address]  [address] de la [place].   Si tiene un problema urgente y no puede comunicarse con nosotros, puede optar por buscar atencin mdica  en el consultorio de su doctor(a), en una clnica privada, en un centro de atencin urgente o en una sala de emergencias.  Si tiene Engineer, drilling, por favor llame inmediatamente al 911 o vaya a la sala de emergencias.  Nmeros de bper  - Dr. Gwen Pounds: [phone]  - Dra. Roseanne Reno: [phone]  - Dr. Katrinka Blazing: [phone]   En caso de inclemencias del tiempo, por favor llame a Lacy Duverney principal al [phone] para una actualizacin sobre el [place] de cualquier retraso o cierre.  Consejos para la medicacin en dermatologa: Por favor, guarde las cajas en las que vienen los medicamentos de uso tpico para ayudarle a seguir las instrucciones sobre dnde y cmo usarlos. Las farmacias generalmente imprimen las instrucciones del medicamento slo en las cajas y no directamente en los tubos del [place].   Si su medicamento es muy caro, por favor, pngase en contacto con Rolm [place]ala llamando al  [phone] y presione la opcin 4 o envenos un mensaje a travs de Clinical cytogeneticist.   No podemos decirle cul ser su copago por los medicamentos por adelantado ya que esto es diferente dependiendo de la cobertura de su seguro. Sin embargo, es posible que podamos encontrar un medicamento sustituto a Audiological scientist un formulario para que el seguro cubra el medicamento que se considera necesario.   Si se requiere una autorizacin previa para que su compaa de seguros [place] su medicamento, por favor permtanos de 1 a 2 das hbiles para completar [address]  Los precios de los medicamentos varan con frecuencia dependiendo del Environmental consultant de dnde se surte la receta y alguna farmacias pueden ofrecer precios ms baratos.  El sitio web www.goodrx.com tiene cupones para medicamentos de Health and safety inspector. Los precios aqu no tienen en cuenta lo que podra costar con la ayuda del seguro (puede ser ms barato con su seguro), pero el sitio web puede darle el precio si no utiliz ningn  seguro.  - Puede imprimir el cupn correspondiente y llevarlo con su receta a la farmacia.  - Tambin puede pasar por nuestra oficina durante el horario de atencin regular y Education officer, museum una tarjeta de cupones de GoodRx.  - Si necesita que su receta se enve electrnicamente a una farmacia diferente, informe a nuestra oficina a travs de MyChart de [place] o por telfono llamando al [phone] y presione la opcin 4.

## 2024-03-19 NOTE — Progress Notes (Signed)
 Isotretinoin  Follow-Up Visit   Subjective  Jody Perez is a 33 y.o. female who presents for the following: Isotretinoin  follow-up reports a few breakouts at face,  Currently taking (2) 40 mg of isotretinoin  by mouth daily with fatty meal. Reports having some dry skin and dry eyes. Denies other side effects. Would like to discuss finishing medication.  Week # 38   Isotretinoin  F/U - 03/19/24 1000       Isotretinoin  Follow Up   Date 03/19/24    Weight 185 lb (83.9 kg)    Two Forms of Birth Control Injection;Female Condom      Dosage   Target Dosage (mg) 12990    Current (To Date) Dosage (mg) 14700    To Go Dosage (mg) -1710      Skin Side Effects   Dry Lips Yes    Nose bleeds No    Dry eyes No    Dry Skin Yes    Sunburn Yes      Gastrointestinal Side Effects   Nausea No    Diarrhea No    Blood in stool No      Neurological Side Effects   Blurred vision No    Depression No    Headache No    Homicidal thoughts No    Mood Changes No    Suicidal thoughts No      Constitutional Side Effects   Fatigue No      Musculoskeletal Side Effects   Muscle aches No            Side effects: Dry skin, dry lips  Patient is not pregnant, not seeking pregnancy, and not breastfeeding.   The following portions of the chart were reviewed this encounter and updated as appropriate: medications, allergies, medical history  Review of Systems:  No other skin or systemic complaints except as noted in HPI or Assessment and Plan.  Objective  Well appearing patient in no apparent distress; mood and affect are within normal limits.  An examination of the face, neck, chest, and back was performed and relevant findings are noted below.    Assessment & Plan   ACNE VULGARIS   Related Medications ISOtretinoin  (ACCUTANE ) 40 MG capsule Take 2 capsules (80 mg total) by mouth daily.  ACNE VULGARIS Patient is currently on Isotretinoin  requiring FDA mandated monthly evaluations and  laboratory monitoring. Condition is currently not to goal (must reach target dose based on weight and also have clear skin for 2 months prior to discontinuation in order to help prevent relapse)  Exam findings: spotty hyperpigmentation face and small active papules anterior chin.   Week # [address] [facility] [address] / Johnella Naas iPLEDGE # [id number] Total mg -  14700 Total mg/kg - 175.21 Birth Control- depo injection and female condoms   Continue isotretinoin  40 mg bid with fatty meal for another 6 weeks. Will plan to discontinue at next follow up.   Urine pregnancy test performed in office today and was negative.  Patient demonstrates comprehension and confirms she will not get pregnant.   Patient confirmed in iPledge and isotretinoin  sent to pharmacy.   Isotretinoin  Counseling; Review and Contraception Counseling: Reviewed potential side effects of isotretinoin  including xerosis, cheilitis, hepatitis, hyperlipidemia, and severe birth defects if taken by a pregnant woman.  Women on isotretinoin  must be celibate (not having sex) or required to use at least 2 birth control methods to prevent pregnancy (unless patient is a female of non-child bearing potential).  Females of child-bearing potential must  have monthly pregnancy tests while on isotretinoin  and report through I-Pledge (FDA monitoring program). Reviewed reports of suicidal ideation in those with a history of depression while taking isotretinoin  and reports of diagnosis of inflammatory bowl disease (IBD) while taking isotretinoin  as well as the lack of evidence for a causal relationship between isotretinoin , depression and IBD. Patient advised to reach out with any questions or concerns. Patient advised not to share pills or donate blood while on treatment or for one month after completing treatment. All patient's considering Isotretinoin  must read and understand and sign Isotretinoin  Consent Form and be registered with  I-Pledge.  Xerosis secondary to isotretinoin  therapy - Continue emollients as directed - Xyzal (levocetirizine) once a day and fish oil 1 gram daily may also help with dryness  Cheilitis secondary to isotretinoin  therapy - Continue lip balm as directed, Dr. Suzann Ernst Cortibalm recommended  Long term medication management (isotretinoin )  Patient is using long term (months to years) prescription medication  to control their dermatologic condition.  These medications require periodic monitoring to evaluate for efficacy and side effects and may require periodic laboratory monitoring.  - While taking Isotretinoin  and for 30 days after you finish the medication, do not get pregnant, do not share pills, do not donate blood. Isotretinoin  is best absorbed when taken with a fatty meal. Isotretinoin  can make you sensitive to the sun. Daily careful sun protection including sunscreen SPF 30+ when outdoors is recommended.  Follow-up in 30 days.  IRandee Busing, CMA, am acting as scribe for Celine Collard, MD.   Documentation: I have reviewed the above documentation for accuracy and completeness, and I agree with the above.  Celine Collard, MD

## 2024-03-20 ENCOUNTER — Encounter: Payer: Self-pay | Admitting: Obstetrics

## 2024-05-01 ENCOUNTER — Encounter: Payer: Self-pay | Admitting: Dermatology

## 2024-05-01 ENCOUNTER — Other Ambulatory Visit: Payer: Self-pay | Admitting: Dermatology

## 2024-05-01 ENCOUNTER — Ambulatory Visit: Admitting: Dermatology

## 2024-05-01 VITALS — Wt 185.0 lb

## 2024-05-01 DIAGNOSIS — L853 Xerosis cutis: Secondary | ICD-10-CM

## 2024-05-01 DIAGNOSIS — T50995A Adverse effect of other drugs, medicaments and biological substances, initial encounter: Secondary | ICD-10-CM | POA: Diagnosis not present

## 2024-05-01 DIAGNOSIS — K130 Diseases of lips: Secondary | ICD-10-CM | POA: Diagnosis not present

## 2024-05-01 DIAGNOSIS — L700 Acne vulgaris: Secondary | ICD-10-CM | POA: Diagnosis not present

## 2024-05-01 DIAGNOSIS — Z79899 Other long term (current) drug therapy: Secondary | ICD-10-CM

## 2024-05-01 DIAGNOSIS — Z7189 Other specified counseling: Secondary | ICD-10-CM

## 2024-05-01 MED ORDER — ISOTRETINOIN 40 MG PO CAPS: 40.0000 mg | ORAL_CAPSULE | Freq: Two times a day (BID) | ORAL | 0 refills | Status: AC

## 2024-05-01 NOTE — Progress Notes (Signed)
 Isotretinoin  Follow-Up Visit   Subjective  Jody Perez is a 33 y.o. female who presents for the following: Isotretinoin  follow-up, taking 40 mg 2 tablet every day, patient report acne breakouts on her chin and dry skin.  Week # 40   Isotretinoin  F/U - 05/01/24 1100       Isotretinoin  Follow Up   iPledge # [id number]    Date 05/01/24    Weight 185 lb (83.9 kg)    Two Forms of Birth Control Injection;Female Condom    Acne breakouts since last visit? No      Dosage   Target Dosage (mg) 12990    Current (To Date) Dosage (mg) 17100    To Go Dosage (mg) -4110      Skin Side Effects   Dry Lips Yes    Nose bleeds No    Dry eyes No    Dry Skin Yes    Sunburn No      Gastrointestinal Side Effects   Nausea No    Diarrhea No    Blood in stool No      Neurological Side Effects   Blurred vision No    Depression No    Headache No    Homicidal thoughts No    Mood Changes No    Suicidal thoughts No      Constitutional Side Effects   Fatigue No      Musculoskeletal Side Effects   Muscle aches No             Side effects: Dry skin, dry lips  Patient is not pregnant, not seeking pregnancy, and not breastfeeding.   The following portions of the chart were reviewed this encounter and updated as appropriate: medications, allergies, medical history  Review of Systems:  No other skin or systemic complaints except as noted in HPI or Assessment and Plan.  Objective  Well appearing patient in no apparent distress; mood and affect are within normal limits.  An examination of the face, neck, chest, and back was performed and relevant findings are noted below.     Assessment & Plan   ACNE VULGARIS Patient is currently on Isotretinoin  requiring FDA mandated monthly evaluations and laboratory monitoring. Condition is currently not to goal (must reach target dose based on weight and also have clear skin for 2 months prior to discontinuation in order to help prevent  relapse)   Exam findings: spotty hyperpigmentation face and small active papules anterior chin.    Week # [address] [facility] [address] / [place] iPLEDGE # [id number] Total mg -  17,100 Total mg/kg - 203.8 Birth Control- depo injection and female condoms    Continue isotretinoin  40 mg bid with fatty meal    Urine pregnancy test performed in office today and was negative.  Patient demonstrates comprehension and confirms she will not get pregnant.     Patient confirmed in iPledge and isotretinoin  sent to pharmacy.   Isotretinoin  Counseling; Review and Contraception Counseling: Reviewed potential side effects of isotretinoin  including xerosis, cheilitis, hepatitis, hyperlipidemia, and severe birth defects if taken by a pregnant woman.  Women on isotretinoin  must be celibate (not having sex) or required to use at least 2 birth control methods to prevent pregnancy (unless patient is a female of non-child bearing potential).  Females of child-bearing potential must have monthly pregnancy tests while on isotretinoin  and report through I-Pledge (FDA monitoring program). Reviewed reports of suicidal ideation in those with a history of depression while taking isotretinoin  and reports  of diagnosis of inflammatory bowl disease (IBD) while taking isotretinoin  as well as the lack of evidence for a causal relationship between isotretinoin , depression and IBD. Patient advised to reach out with any questions or concerns. Patient advised not to share pills or donate blood while on treatment or for one month after completing treatment. All patient's considering Isotretinoin  must read and understand and sign Isotretinoin  Consent Form and be registered with I-Pledge.  Xerosis secondary to isotretinoin  therapy - Continue emollients as directed - Xyzal (levocetirizine) once a day and fish oil 1 gram daily may also help with dryness   Cheilitis secondary to isotretinoin  therapy - Continue lip balm as  directed, Dr. Suzann Ernst Cortibalm recommended  Long term medication management (isotretinoin )  Patient is using long term (months to years) prescription medication  to control their dermatologic condition.  These medications require periodic monitoring to evaluate for efficacy and side effects and may require periodic laboratory monitoring.  - While taking Isotretinoin  and for 30 days after you finish the medication, do not get pregnant, do not share pills, do not donate blood. Isotretinoin  is best absorbed when taken with a fatty meal. Isotretinoin  can make you sensitive to the sun. Daily careful sun protection including sunscreen SPF 30+ when outdoors is recommended.  Follow-up in 30 days.  IClara Crisp, CMA, am acting as scribe for Celine Collard, MD .   Documentation: I have reviewed the above documentation for accuracy and completeness, and I agree with the above.  Celine Collard, MD

## 2024-05-01 NOTE — Patient Instructions (Signed)

## 2024-05-26 ENCOUNTER — Ambulatory Visit

## 2024-05-26 VITALS — BP 132/89 | Ht 64.0 in | Wt 189.0 lb

## 2024-05-26 DIAGNOSIS — Z3009 Encounter for other general counseling and advice on contraception: Secondary | ICD-10-CM

## 2024-05-26 DIAGNOSIS — Z30013 Encounter for initial prescription of injectable contraceptive: Secondary | ICD-10-CM | POA: Diagnosis not present

## 2024-05-26 DIAGNOSIS — Z309 Encounter for contraceptive management, unspecified: Secondary | ICD-10-CM | POA: Diagnosis not present

## 2024-05-26 DIAGNOSIS — Z3042 Encounter for surveillance of injectable contraceptive: Secondary | ICD-10-CM

## 2024-05-26 MED ORDER — MEDROXYPROGESTERONE ACETATE 150 MG/ML IM SUSP
150.0000 mg | Freq: Once | INTRAMUSCULAR | Status: AC
  Administered 2024-05-26: 150 mg via INTRAMUSCULAR

## 2024-05-26 NOTE — Progress Notes (Signed)
 11 Weeks   3 Days since last Depo  Counseled to adhere to 11 to 13 week intervals between depo injections for optimal benefit.  Depo given today per standing order by Dr JAYSON Helling.  Tolerated well Right deltoid.  Plans to have PE when Next depo is due, approx 08/11/2024, has reminder card.  Marrie Chandra, RN

## 2024-06-03 ENCOUNTER — Ambulatory Visit (INDEPENDENT_AMBULATORY_CARE_PROVIDER_SITE_OTHER): Admitting: Dermatology

## 2024-06-03 ENCOUNTER — Encounter: Payer: Self-pay | Admitting: Dermatology

## 2024-06-03 VITALS — Wt 189.0 lb

## 2024-06-03 DIAGNOSIS — K130 Diseases of lips: Secondary | ICD-10-CM | POA: Diagnosis not present

## 2024-06-03 DIAGNOSIS — L700 Acne vulgaris: Secondary | ICD-10-CM | POA: Diagnosis not present

## 2024-06-03 DIAGNOSIS — T50995A Adverse effect of other drugs, medicaments and biological substances, initial encounter: Secondary | ICD-10-CM | POA: Diagnosis not present

## 2024-06-03 DIAGNOSIS — L853 Xerosis cutis: Secondary | ICD-10-CM | POA: Diagnosis not present

## 2024-06-03 DIAGNOSIS — Z79899 Other long term (current) drug therapy: Secondary | ICD-10-CM

## 2024-06-03 DIAGNOSIS — Z7189 Other specified counseling: Secondary | ICD-10-CM

## 2024-06-03 MED ORDER — CLINDAMYCIN PHOS-BENZOYL PEROX 1-5 % EX GEL: CUTANEOUS | 11 refills | Status: AC

## 2024-06-03 NOTE — Progress Notes (Signed)
 Isotretinoin  Follow-Up Visit   Subjective  Jody Perez is a 33 y.o. female who presents for the following: Isotretinoin  follow-up Still having some breakouts Reports flares and nose and chin   Week # 44   Isotretinoin  F/U - 06/03/24 1000       Isotretinoin  Follow Up   iPledge # [id number]    Date 06/03/24    Weight 189 lb (85.7 kg)    Two Forms of Birth Control Injection;Female Condom      Dosage   Target Dosage (mg) 12990    Current (To Date) Dosage (mg) 19500    To Go Dosage (mg) -6510      Skin Side Effects   Dry Lips Yes    Nose bleeds No    Dry eyes No    Dry Skin No    Sunburn No      Gastrointestinal Side Effects   Nausea No    Diarrhea No    Blood in stool No      Neurological Side Effects   Blurred vision No    Depression No    Headache No    Homicidal thoughts No    Mood Changes No    Suicidal thoughts No      Constitutional Side Effects   Fatigue No      Musculoskeletal Side Effects   Muscle aches No         Side effects: Dry skin, dry lips  Patient is not pregnant, not seeking pregnancy, and not breastfeeding.   The following portions of the chart were reviewed this encounter and updated as appropriate: medications, allergies, medical history  Review of Systems:  No other skin or systemic complaints except as noted in HPI or Assessment and Plan.  Objective  Well appearing patient in no apparent distress; mood and affect are within normal limits.  An examination of the face, neck, chest, and back was performed and relevant findings are noted below.     Assessment & Plan   ACNE VULGARIS   Related Medications clindamycin -benzoyl peroxide (BENZACLIN) gel Apply pea sized amount to face qhs as tolerated  ACNE VULGARIS Persistent acne although much improved.  Pt wants to stop Isotretinoin  now.  Chronic and persistent condition with duration or expected duration over one year. Condition is improving with treatment but not  currently at goal.  Patient is currently on Isotretinoin  requiring FDA mandated monthly evaluations and laboratory monitoring. Condition is currently not to goal (must reach target dose based on weight and also have clear skin for 2 months prior to discontinuation in order to help prevent relapse)  Exam findings: Inflammatory papules at nasal labial fold and chin  Week # [address] [facility] [address] / [place]  iPLEDGE # [id number] Total mg -  19500 Total mg/kg - 228 Birth Control- Birth Control- depo injection and female condoms   Patient would like to discontinue medication Recommend finishing all medication Will recheck UPT in 30 days  Start Clindamycin  Phos- Benzoyl Perox 1-5 % apply pea sized amount to face qhs as tolerated for acne  Urine pregnancy test performed in office today and was negative.  Patient demonstrates comprehension and confirms she will not get pregnant.    Isotretinoin  Counseling; Review and Contraception Counseling: Reviewed potential side effects of isotretinoin  including xerosis, cheilitis, hepatitis, hyperlipidemia, and severe birth defects if taken by a pregnant woman.  Women on isotretinoin  must be celibate (not having sex) or required to use at least 2 birth control methods to  prevent pregnancy (unless patient is a female of non-child bearing potential).  Females of child-bearing potential must have monthly pregnancy tests while on isotretinoin  and report through I-Pledge (FDA monitoring program). Reviewed reports of suicidal ideation in those with a history of depression while taking isotretinoin  and reports of diagnosis of inflammatory bowl disease (IBD) while taking isotretinoin  as well as the lack of evidence for a causal relationship between isotretinoin , depression and IBD. Patient advised to reach out with any questions or concerns. Patient advised not to share pills or donate blood while on treatment or for one month after completing  treatment. All patient's considering Isotretinoin  must read and understand and sign Isotretinoin  Consent Form and be registered with I-Pledge.  Xerosis secondary to isotretinoin  therapy - Continue emollients as directed - Xyzal (levocetirizine) once a day and fish oil 1 gram daily may also help with dryness   Cheilitis secondary to isotretinoin  therapy - Continue lip balm as directed, Dr. Horald Cortibalm recommended   Long term medication management (isotretinoin )  Patient is using long term (months to years) prescription medication  to control their dermatologic condition.  These medications require periodic monitoring to evaluate for efficacy and side effects and may require periodic laboratory monitoring.  - While taking Isotretinoin  and for 30 days after you finish the medication, do not get pregnant, do not share pills, do not donate blood. Isotretinoin  is best absorbed when taken with a fatty meal. Isotretinoin  can make you sensitive to the sun. Daily careful sun protection including sunscreen SPF 30+ when outdoors is recommended.  Follow-up in 30 days.  IEleanor Blush, CMA, am acting as scribe for Alm Rhyme, MD.   Documentation: I have reviewed the above documentation for accuracy and completeness, and I agree with the above.  Alm Rhyme, MD

## 2024-06-03 NOTE — Patient Instructions (Addendum)
 While taking Isotretinoin  and for 30 days after you finish the medication, do not get pregnant, do not share pills, do not donate blood.  Generic isotretinoin  is best absorbed when taken with a fatty meal. Isotretinoin  can make you sensitive to the sun. Daily careful sun protection including sunscreen SPF 30+ when outdoors is recommended.   Topical retinoid medications like tretinoin/Retin-A, adapalene /Differin , tazarotene/Fabior, and Epiduo/Epiduo Forte can cause dryness and irritation when first started. Only apply a pea-sized amount to the entire affected area. Avoid applying it around the eyes, edges of mouth and creases at the nose. If you experience irritation, use a good moisturizer first and/or apply the medicine less often. If you are doing well with the medicine, you can increase how often you use it until you are applying every night. Be careful with sun protection while using this medication as it can make you sensitive to the sun. This medicine should not be used by pregnant women.    Due to recent changes in healthcare laws, you may see results of your pathology and/or laboratory studies on MyChart before the doctors have had a chance to review them. We understand that in some cases there may be results that are confusing or concerning to you. Please understand that not all results are received at the same time and often the doctors may need to interpret multiple results in order to provide you with the best plan of care or course of treatment. Therefore, we ask that you please give us  2 business days to thoroughly review all your results before contacting the office for clarification. Should we see a critical lab result, you will be contacted sooner.   If You Need Anything After Your Visit  If you have any questions or concerns for your doctor, please call our main line at [phone] and press option 4 to reach your doctor's medical assistant. If no one answers, please leave a voicemail  as directed and we will return your call as soon as possible. Messages left after 4 pm will be answered the following business day.   You may also send us  a message via MyChart. We typically respond to MyChart messages within 1-2 business days.  For prescription refills, please ask your pharmacy to contact our office. Our fax number is [phone].  If you have an urgent issue when the clinic is closed that cannot wait until the next business day, you can page your doctor at the number below.    Please note that while we do our best to be available for urgent issues outside of office hours, we are not available 24/7.   If you have an urgent issue and are unable to reach us , you may choose to seek medical care at your doctor's office, retail clinic, urgent care center, or emergency room.  If you have a medical emergency, please immediately call 911 or go to the emergency department.  Pager Numbers  - Dr. Hester: [phone]  - Dr. Jackquline: [phone]  - Dr. Claudene: [phone]   In the event of inclement weather, please call our main line at [phone] for an update on the status of any delays or closures.  Dermatology Medication Tips: Please keep the boxes that topical medications come in in order to help keep track of the instructions about where and how to use these. Pharmacies typically print the medication instructions only on the boxes and not directly on the medication tubes.   If your medication is too expensive, please contact our office  at [phone] option 4 or send us  a message through MyChart.   We are unable to tell what your co-pay for medications will be in advance as this is different depending on your insurance coverage. However, we may be able to find a substitute medication at lower cost or fill out paperwork to get insurance to cover a needed medication.   If a prior authorization is required to get your medication covered by your insurance company, please  allow us  1-2 business days to complete this process.  Drug prices often vary depending on where the prescription is filled and some pharmacies may offer cheaper prices.  The website www.goodrx.com contains coupons for medications through different pharmacies. The prices here do not account for what the cost may be with help from insurance (it may be cheaper with your insurance), but the website can give you the price if you did not use any insurance.  - You can print the associated coupon and take it with your prescription to the pharmacy.  - You may also stop by our office during regular business hours and pick up a GoodRx coupon card.  - If you need your prescription sent electronically to a different pharmacy, notify our office through [facility] or by phone at [phone] option 4.     Si Usted Necesita Algo Despus de Su Visita  Tambin puede enviarnos un mensaje a travs de Clinical cytogeneticist. Por lo general respondemos a los mensajes de MyChart en el transcurso de 1 a 2 das hbiles.  Para renovar recetas, por favor pida a su farmacia que se ponga en contacto con nuestra oficina. Randi lakes de fax es [place] [phone].  Si tiene un asunto urgente cuando la clnica est cerrada y que no puede esperar hasta el siguiente da hbil, puede llamar/localizar a su doctor(a) al nmero que aparece a continuacin.   Por favor, tenga en cuenta que aunque hacemos todo lo posible para estar disponibles para asuntos urgentes fuera del horario de [place], no estamos disponibles las 24 horas del da, los 7 [address]  [address] de la [place].   Si tiene un problema urgente y no puede comunicarse con nosotros, puede optar por buscar atencin mdica  en el consultorio de su doctor(a), en una clnica privada, en un centro de atencin urgente o en una sala de emergencias.  Si tiene Engineer, drilling, por favor llame inmediatamente al 911 o vaya a la sala de emergencias.  Nmeros de bper  - Dr. Hester:  [phone]  - Dra. Jackquline: [phone]  - Dr. Claudene: [phone]   En caso de inclemencias del tiempo, por favor llame a landry capes principal al [phone] para una actualizacin sobre el [place] de cualquier retraso o cierre.  Consejos para la medicacin en dermatologa: Por favor, guarde las cajas en las que vienen los medicamentos de uso tpico para ayudarle a seguir las instrucciones sobre dnde y cmo usarlos. Las farmacias generalmente imprimen las instrucciones del medicamento slo en las cajas y no directamente en los tubos del [place].   Si su medicamento es muy caro, por favor, pngase en contacto con landry rieger llamando al [phone] y presione la opcin 4 o envenos un mensaje a travs de Clinical cytogeneticist.   No podemos decirle cul ser su copago por los medicamentos por adelantado ya que esto es diferente dependiendo de la cobertura de su seguro. Sin embargo, es posible que podamos encontrar un medicamento sustituto a Audiological scientist un formulario para que el seguro cubra el medicamento que  se considera necesario.   Si se requiere una autorizacin previa para que su compaa de seguros malta su medicamento, por favor permtanos de 1 a 2 das hbiles para completar este proceso.  Los precios de los medicamentos varan con frecuencia dependiendo del Environmental consultant de dnde se surte la receta y alguna farmacias pueden ofrecer precios ms baratos.  El sitio web www.goodrx.com tiene cupones para medicamentos de Health and safety inspector. Los precios aqu no tienen en cuenta lo que podra costar con la ayuda del seguro (puede ser ms barato con su seguro), pero el sitio web puede darle el precio si no utiliz Tourist information centre manager.  - Puede imprimir el cupn correspondiente y llevarlo con su receta a la farmacia.  - Tambin puede pasar por nuestra oficina durante el horario de atencin regular y Education officer, museum una tarjeta de cupones de GoodRx.  - Si necesita que su receta se enve electrnicamente a  una farmacia diferente, informe a nuestra oficina a travs de MyChart de [place] o por telfono llamando al [phone] y presione la opcin 4.

## 2024-07-03 ENCOUNTER — Telehealth: Payer: Self-pay

## 2024-07-03 ENCOUNTER — Ambulatory Visit

## 2024-07-03 DIAGNOSIS — Z79899 Other long term (current) drug therapy: Secondary | ICD-10-CM

## 2024-07-03 DIAGNOSIS — L700 Acne vulgaris: Secondary | ICD-10-CM

## 2024-07-03 NOTE — Telephone Encounter (Signed)
 Patient unable to use restroom in office today. Will go for HCG blood draw with [facility].  Patient finished accutane  one month ago. aw

## 2024-07-05 LAB — HCG, SERUM, QUALITATIVE: hCG,Beta Subunit,Qual,Serum: NEGATIVE m[IU]/mL (ref <6)

## 2024-07-06 ENCOUNTER — Ambulatory Visit: Payer: Self-pay | Admitting: Dermatology

## 2024-07-07 ENCOUNTER — Telehealth: Payer: Self-pay

## 2024-07-07 NOTE — Telephone Encounter (Signed)
 Post Isotretinoin  pregnancy results entered on I pledge program

## 2024-08-12 ENCOUNTER — Ambulatory Visit

## 2024-08-12 VITALS — BP 137/94 | HR 86 | Ht 64.0 in | Wt 190.0 lb

## 2024-08-12 DIAGNOSIS — Z3009 Encounter for other general counseling and advice on contraception: Secondary | ICD-10-CM

## 2024-08-12 DIAGNOSIS — Z30013 Encounter for initial prescription of injectable contraceptive: Secondary | ICD-10-CM | POA: Diagnosis not present

## 2024-08-12 DIAGNOSIS — Z3042 Encounter for surveillance of injectable contraceptive: Secondary | ICD-10-CM

## 2024-08-12 DIAGNOSIS — Z113 Encounter for screening for infections with a predominantly sexual mode of transmission: Secondary | ICD-10-CM

## 2024-08-12 DIAGNOSIS — Z309 Encounter for contraceptive management, unspecified: Secondary | ICD-10-CM | POA: Diagnosis not present

## 2024-08-12 LAB — HM HIV SCREENING LAB: HM HIV Screening: NEGATIVE

## 2024-08-12 LAB — WET PREP FOR TRICH, YEAST, CLUE
Clue Cell Exam: POSITIVE — AB
Trichomonas Exam: NEGATIVE

## 2024-08-12 MED ORDER — MEDROXYPROGESTERONE ACETATE 150 MG/ML IM SUSP
150.0000 mg | INTRAMUSCULAR | Status: AC
  Administered 2024-08-12 – 2024-12-25 (×2): 150 mg via INTRAMUSCULAR

## 2024-08-12 NOTE — Progress Notes (Signed)
 [employer] [facility] DEPARTMENT [facility] [address], [address] [place] [address] Main phone: [phone]  Family Planning Visit - Repeat Yearly Visit  Subjective:  Jody Perez is a 33 y.o. [id number]  being seen today for an annual wellness visit and to discuss contraception options. The patient is currently using hormonal injection for pregnancy prevention. Patient does not want a pregnancy in the next year.   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   Blurry vision 02/28/2022   Herniated disc, cervical 02/25/2022   Iron  deficiency anemia, unspecified 02/25/2022   Hemorrhagic ovarian cyst 02/25/2022   Leukocytosis 02/25/2022   Incomplete miscarriage 01/01/22 01/24/2022   Trichomonas infection12/17/19 01/23/2022   Liposuction 08/2021 01/23/2022   Migraine without aura and without status migrainosus, not intractable 01/11/2021   Vapes nicotine containing substance 07/30/2020   Obesity BMI=29.9 04/07/2020   Recurrent vaginitis 06/06/2019   History of genital HSV 01/28/2013   Chief Complaint  Patient presents with   Annual Exam    Pt is here for PE and Contraception   HPI Patient reports desire for Depo and STI testing. May want to switch to the Nexplanon at some point.  No vaginal or breast concerns today. No other concerns.  Review of Systems  All other systems reviewed and are negative.  See flowsheet for further details and programmatic requirements Hyperlink available at the top of the signed note in blue.  Flow sheet content below:  Pregnancy Intention Screening Does the patient want to become pregnant in the  next year?: No Does the patient's partner want to become pregnant in the next year?: No Would the patient like to discuss contraceptive options today?: No Other:  Password: lucky Contraception History Past methods of contraception used by patient:: Hormonal Injection Adverse effects associated with Hormonal Injection: none Sexual History What age did you start your period?: 13 How often do you have your period?: no periods Date of last sex?: 08/10/24 Has the patient had unprotected sex within the last 5 days?: No Do you have sex with men, women, both men and women?: Men only In the past 2 months how many partners have you had sex with?: 1 In the past 12 months, how many partners have you had sex with?: 1 Is it possible that any of your sex partners in the past 12 months had sex with someone else whild they were still in a sexual relationship with you?: Yes What ways do you have sex?: Vaginal, Oral, Anal Do you or your partner use condoms and/or dental dams every time you have vaginal, oral or anal sex?: No Do you douche?: No Date of last HIV test?: 07/09/23 Have you ever had an STD?: Yes Have any of your partners had an STD?: Yes Partner Previous STD?: Trichomonas, Chlamydia, Gonorrhea Date?:  (long ago) Have you or your partner ever shot up drugs?: No Have any of your partners used drugs in the past?: No Have you or your partners exchanged money or drugs for sex?: No Risk Factors for Hep B Household, sexual, or needle sharing contact of a person infected with Hep B: No Sexual contact with a person who uses drugs not  as prescribed?: No Currently or Ever used drugs not as prescribed: No HIV Positive: No PRep Patient: No Men who have sex with men: No Have Hepatitis C: No History of Incarceration: No History of Homeslessness?: No Anal sex following anal drug use?: No Risk Factors for Hep C Currently using drugs not as prescribed: No Sexual partner(s) currently using drugs as not  prescribed: No History of drug use: No HIV Positive: No People with a history of incarceration: No People born between the years of 1945 and 4: No Contraception Wrap Up Current Method: Hormonal Injection End Method: Hormonal Injection Contraception Counseling Provided: Yes How was the end contraceptive method provided?: Provided on site  Diabetes screening This patient is 33 y.o. with a BMI of Body mass index is 32.61 kg/m.Jody Perez  Is patient eligible for diabetes screening (age >35 and BMI >25)?  no  Was Hgb A1c ordered? no  STI screening Patient reports 1 of partners in last year.  Does this patient desire STI screening?  Yes  Hepatitis C screening Has patient been screened once for HCV in the past?  Yes  No results found for: HCVAB  Does the patient meet criteria for HCV testing? No  (If yes-- Screen for HCV through [facility]) Criteria:  Since the last HCV result, does the patient have any of the following? - Current drug use - Have a partner with drug use - Has been incarcerated  Hepatitis B screening Does the patient meet criteria for HBV testing? No Criteria:  -Household, sexual or needle sharing contact with HBV -History of drug use -HIV positive -Those with known Hep C  Cervical Cancer Screening  Result Date Procedure Results Follow-ups  03/30/2022 IGP, Aptima HPV DIAGNOSIS:: Comment Specimen adequacy:: Comment Clinician Provided ICD10: Comment Performed by:: Comment PAP Smear Comment: . Note:: Comment Test Methodology: Comment HPV Aptima: Negative   11/12/2018 Cytology - PAP Adequacy: Satisfactory for evaluation  endocervical/transformation zone component PRESENT. Diagnosis: NEGATIVE FOR INTRAEPITHELIAL LESIONS OR MALIGNANCY. Diagnosis: TRICHOMONAS VAGINALIS PRESENT. HPV: NOT DETECTED Material Submitted: CervicoVaginal Pap [ThinPrep Imaged]   01/30/2018 HM PAP SMEAR HM Pap smear: negative for intraepithelial lesion or malignancy   05/03/2016 Cytology -  PAP CYTOLOGY - PAP: PAP RESULT   12/05/2013 Cytology - PAP      Health Maintenance Due  Topic Date Due   COVID-19 Vaccine (1) Never done   Hepatitis C Screening  Never done   Pneumococcal Vaccine (1 of 2 - PCV) Never done   Hepatitis B Vaccines 19-59 Average Risk (1 of 3 - 19+ 3-dose series) Never done   HPV VACCINES (3 - Risk 3-dose series) 12/29/2018   Influenza Vaccine  Never done    The following portions of the patient's history were reviewed and updated as appropriate: allergies, current medications, past family history, past medical history, past social history, past surgical history and problem list. Problem list updated.  Objective:   Vitals:   08/12/24 0855  BP: (!) 137/94  Pulse: 86  Weight: 190 lb (86.2 kg)  Height: 5' 4 (1.626 m)   Physical Exam Constitutional:      Appearance: Normal appearance.  HENT:     Head: Normocephalic.     Mouth/Throat:     Mouth: Mucous membranes are moist.     Pharynx: Posterior oropharyngeal erythema present. No oropharyngeal exudate.     Tonsils: Tonsillar abscess present. 2+ on the right. 2+ on the left.  Eyes:     General: No scleral icterus.  Right eye: No discharge.        Left eye: No discharge.  Pulmonary:     Effort: Pulmonary effort is normal.  Genitourinary:    Comments: No symptoms, self swabbed Lymphadenopathy:     Cervical: No cervical adenopathy.  Skin:    General: Skin is warm and dry.  Neurological:     Mental Status: She is alert.  Psychiatric:        Mood and Affect: Mood normal.        Behavior: Behavior normal.     Assessment and Plan:  Jody Perez is a 33 y.o. female [phone] presenting to the [facility] Department for an yearly wellness and contraception visit  1. Family planning (Primary)  Contraception counseling:  Reviewed options based on patient desire and reproductive life plan. Patient is interested in Hormonal Injection. This was provided to the patient today.   Risks,  benefits, and typical effectiveness rates were reviewed.  Questions were answered.  Written information was also given to the patient to review.    The patient will follow up in  3 months for surveillance.  The patient was told to call with any further questions, or with any concerns about this method of contraception.  Emphasized use of condoms 100% of the time for STI prevention.  Emergency Contraception Precautions (ECP): Patient assessed for need of ECP. She is not a candidate based on depo provera  within recommended dates.   2. Screening for venereal disease  - WET PREP FOR TRICH, YEAST, CLUE - Chlamydia/Gonorrhea [place] [facility] - HIV [place] [facility] - Syphilis Serology, [facility] - Gonococcus culture rectal - Gonococcus culture pharyngeal  3. Encounter for Depo-Provera  contraception  - medroxyPROGESTERone  (DEPO-PROVERA ) injection 150 mg   Return in about 3 months (around 11/11/2024).  Future Appointments  Date Time Provider Department Center  10/08/2024 10:15 AM Hester Alm BROCKS, MD [facility] None  12/10/2024 10:30 AM Hester Alm BROCKS, MD [facility] None    Damien FORBES Satchel, NP

## 2024-08-12 NOTE — Progress Notes (Signed)
 Pt is here for for PE and depo IM injection. Wet prep results reviewed with pt and requires no treatment per provider. IM depo injection given to patient at the Lt Deltoid and pt tolerated well to injection.Reminder card given and condoms declined. Wilkie Drought, RN.

## 2024-08-17 LAB — GONOCOCCUS CULTURE

## 2024-10-08 ENCOUNTER — Ambulatory Visit (INDEPENDENT_AMBULATORY_CARE_PROVIDER_SITE_OTHER): Admitting: Dermatology

## 2024-10-08 ENCOUNTER — Encounter: Payer: Self-pay | Admitting: Dermatology

## 2024-10-08 DIAGNOSIS — L700 Acne vulgaris: Secondary | ICD-10-CM

## 2024-10-08 DIAGNOSIS — L819 Disorder of pigmentation, unspecified: Secondary | ICD-10-CM

## 2024-10-08 DIAGNOSIS — D2239 Melanocytic nevi of other parts of face: Secondary | ICD-10-CM | POA: Diagnosis not present

## 2024-10-08 DIAGNOSIS — D492 Neoplasm of unspecified behavior of bone, soft tissue, and skin: Secondary | ICD-10-CM | POA: Diagnosis not present

## 2024-10-08 DIAGNOSIS — L810 Postinflammatory hyperpigmentation: Secondary | ICD-10-CM | POA: Diagnosis not present

## 2024-10-08 NOTE — Progress Notes (Signed)
   Follow-Up Visit   Subjective  Jody Perez is a 33 y.o. female who presents for the following: Mole on nose. Would like removed. Bothersome. Rubbed, irritated.  Acne follow up. Finished Isotretinoin  05/2024. States has not had acne flares since finishing.   The patient has spots, moles and lesions to be evaluated, some may be new or changing and the patient may have concern these could be cancer.  The following portions of the chart were reviewed this encounter and updated as appropriate: medications, allergies, medical history  Review of Systems:  No other skin or systemic complaints except as noted in HPI or Assessment and Plan.  Objective  Well appearing patient in no apparent distress; mood and affect are within normal limits.  A focused examination was performed of the following areas: Nose   Relevant physical exam findings are noted in the Assessment and Plan.  Mid Tip of Nose 3 mm brown papule   Assessment & Plan   ACNE VULGARIS, Status post Isotretinoin  therapy. Finished July 2025.SABRA Exam: no active acne lesions today. Chronic condition with duration or expected duration over one year. Currently well-controlled. Treatment Plan: Observe.    POST-INFLAMMATORY HYPERPIGMENTATION (PIH) Exam: hyperpigmented macules at face Post-inflammatory hyperpimentation (PIH)  is a benign condition that comes from having previous inflammation in the skin and will fade with time over months to sometimes years.    Treatment Plan:  Recommend daily sun protection including sunscreen SPF 30+ to sun-exposed areas.   NEOPLASM OF SKIN Mid Tip of Nose Epidermal / dermal shaving  Lesion diameter (cm):  0.3 Informed consent: discussed and consent obtained   Timeout: patient name, date of birth, surgical site, and procedure verified   Procedure prep:  Patient was prepped and draped in usual sterile fashion Prep type:  Isopropyl alcohol Anesthesia: the lesion was anesthetized in a  standard fashion   Anesthetic:  1% lidocaine  w/ epinephrine  1-100,000 buffered w/ 8.4% NaHCO3 Instrument used: flexible razor blade   Hemostasis achieved with: pressure, aluminum chloride and electrodesiccation   Outcome: patient tolerated procedure well   Post-procedure details: sterile dressing applied and wound care instructions given   Dressing type: bandage and petrolatum    Specimen 1 - Surgical pathology Differential Diagnosis: Nevus, R/O dysplastic nevus  Check Margins: Yes   Return if symptoms worsen or fail to improve.  I, Jill Parcell, CMA, am acting as scribe for Alm Rhyme, MD.   Documentation: I have reviewed the above documentation for accuracy and completeness, and I agree with the above.  Alm Rhyme, MD

## 2024-10-08 NOTE — Patient Instructions (Signed)

## 2024-10-09 ENCOUNTER — Encounter: Payer: Self-pay | Admitting: Dermatology

## 2024-10-10 LAB — SURGICAL PATHOLOGY

## 2024-10-13 ENCOUNTER — Ambulatory Visit: Payer: Self-pay | Admitting: Dermatology

## 2024-10-14 NOTE — Telephone Encounter (Signed)
LM on VM please return my call  

## 2024-10-14 NOTE — Telephone Encounter (Signed)
-----   Message from Alm Rhyme sent at 10/13/2024  5:24 PM EST ----- FINAL DIAGNOSIS        1. Skin, mid tip nose :       CONSISTENT WITH FIBROUS PAPULE, SEE DESCRIPTION    Benign fibrous papule of nose There is some potential for recurrence observe ----- Message ----- From: Interface, Lab In Three Zero One Sent: 10/10/2024   5:02 PM EST To: Alm JAYSON Rhyme, MD

## 2024-10-16 NOTE — Telephone Encounter (Addendum)
 Tried calling patient regarding results. No answer. Lm for patient to return call.  ----- Message from Alm Rhyme sent at 10/13/2024  5:24 PM EST ----- FINAL DIAGNOSIS        1. Skin, mid tip nose :       CONSISTENT WITH FIBROUS PAPULE, SEE DESCRIPTION    Benign fibrous papule of nose There is some potential for recurrence observe ----- Message ----- From: Interface, Lab In Three Zero One Sent: 10/10/2024   5:02 PM EST To: Alm JAYSON Rhyme, MD

## 2024-10-20 NOTE — Telephone Encounter (Addendum)
 Patient returned call about bx results. Denied further questions.   ----- Message from Alm Rhyme sent at 10/13/2024  5:24 PM EST ----- FINAL DIAGNOSIS        1. Skin, mid tip nose :       CONSISTENT WITH FIBROUS PAPULE, SEE DESCRIPTION    Benign fibrous papule of nose There is some potential for recurrence observe ----- Message ----- From: Interface, Lab In Three Zero One Sent: 10/10/2024   5:02 PM EST To: Alm JAYSON Rhyme, MD

## 2024-12-10 ENCOUNTER — Ambulatory Visit: Admitting: Dermatology

## 2024-12-25 ENCOUNTER — Ambulatory Visit: Admitting: Family Medicine

## 2024-12-25 VITALS — BP 136/86 | HR 108 | Ht 64.0 in | Wt 189.2 lb

## 2024-12-25 DIAGNOSIS — Z30013 Encounter for initial prescription of injectable contraceptive: Secondary | ICD-10-CM

## 2024-12-25 DIAGNOSIS — Z113 Encounter for screening for infections with a predominantly sexual mode of transmission: Secondary | ICD-10-CM

## 2024-12-25 DIAGNOSIS — Z3042 Encounter for surveillance of injectable contraceptive: Secondary | ICD-10-CM

## 2024-12-25 DIAGNOSIS — Z309 Encounter for contraceptive management, unspecified: Secondary | ICD-10-CM

## 2024-12-25 DIAGNOSIS — Z3200 Encounter for pregnancy test, result unknown: Secondary | ICD-10-CM

## 2024-12-25 DIAGNOSIS — Z3202 Encounter for pregnancy test, result negative: Secondary | ICD-10-CM

## 2024-12-25 LAB — WET PREP FOR TRICH, YEAST, CLUE
Clue Cell Exam: NEGATIVE
Trichomonas Exam: NEGATIVE
Yeast Exam: NEGATIVE

## 2024-12-25 LAB — PREGNANCY, URINE: Preg Test, Ur: NEGATIVE

## 2024-12-25 NOTE — Progress Notes (Signed)
 Pt is here for Acute visit. Wet Prep results reviewed with pt and requires no treatment. Depo IM injection given to pt at the Rt Deltoid and pt tolerated well to the injection with no complications. Condoms declined and reminder card given. Opportunity given to Patient to ask questions for any clarifications, questions answered. Wilkie Drought, RN.

## 2024-12-26 ENCOUNTER — Encounter: Payer: Self-pay | Admitting: Family Medicine

## 2024-12-26 NOTE — Progress Notes (Signed)
" °  [employer] [facility] DEPARTMENT [facility] [address], [address] [place] [address] Main phone: [phone]  Women's Health Problem Visit   Subjective:  Jody Perez is a 34 y.o. being seen today for late depo injection.   Chief Complaint  Patient presents with   Contraception    Depo    HPI  Patient is here to restart depo. Had physical in Sept. Slightly elevated BP reading today- endorses vaping with nicotine. Encouraged to quit- declines quitting resources today   Health Maintenance Due  Topic Date Due   COVID-19 Vaccine (1) Never done   Hepatitis C Screening  Never done   Hepatitis B Vaccines 19-59 Average Risk (1 of 3 - 19+ 3-dose series) Never done   HPV VACCINES (3 - Risk 3-dose series) 12/29/2018   Influenza Vaccine  Never done    ROS  The following portions of the patient's history were reviewed and updated as appropriate: allergies, current medications, past family history, past medical history, past social history, past surgical history and problem list. Problem list updated.  See flowsheet for other program required questions.  Objective:   Vitals:   12/25/24 1340  BP: 136/86  Pulse: (!) 108  Weight: 189 lb 3.2 oz (85.8 kg)  Height: 5' 4 (1.626 m)    Physical Exam -deferred- done in 07/2024  Assessment and Plan:  Angila Wombles is a 34 y.o. female presenting to the [facility] Department for a Women's Health problem visit  1. Encounter for pregnancy test, result unknown (Primary) Negative PT reviewed with patient in clinic  - Pregnancy, urine  2. Encounter for Depo-Provera  contraception Ok to give depo today -pt's last unprotected sex was 7 days ago- does not qualify for ECP- encouraged to take PT at home in 2 weeks  3. Screening for venereal disease -denies symptoms- desired to self swab  - HIV [place] [facility] - Syphilis Serology, [place] [facility] - Chlamydia/Gonorrhea [place] [facility] - WET PREP FOR  TRICH, YEAST, CLUE   Return in about 3 months (around 03/25/2025) for depo injection.  No future appointments.  Verneta Bers, [place] "
